# Patient Record
Sex: Male | Born: 2000 | Race: Black or African American | Hispanic: No | Marital: Single | Smoking: Never smoker
Health system: Southern US, Community
[De-identification: ages and names within clinical notes are randomized; demographics above are authoritative.]

## PROBLEM LIST (undated history)

## (undated) DIAGNOSIS — D571 Sickle-cell disease without crisis: Secondary | ICD-10-CM

## (undated) DIAGNOSIS — D5700 Hb-SS disease with crisis, unspecified: Secondary | ICD-10-CM

## (undated) DIAGNOSIS — I10 Essential (primary) hypertension: Secondary | ICD-10-CM

## (undated) DIAGNOSIS — J189 Pneumonia, unspecified organism: Secondary | ICD-10-CM

## (undated) DIAGNOSIS — D5701 Hb-SS disease with acute chest syndrome: Secondary | ICD-10-CM

## (undated) DIAGNOSIS — J45909 Unspecified asthma, uncomplicated: Secondary | ICD-10-CM

## (undated) DIAGNOSIS — H538 Other visual disturbances: Secondary | ICD-10-CM

## (undated) DIAGNOSIS — K047 Periapical abscess without sinus: Secondary | ICD-10-CM

## (undated) DIAGNOSIS — N08 Glomerular disorders in diseases classified elsewhere: Secondary | ICD-10-CM

## (undated) HISTORY — PX: TONSILLECTOMY AND ADENOIDECTOMY: SUR1326

## (undated) HISTORY — PX: TONSILLECTOMY: SUR1361

## (undated) HISTORY — PX: CIRCUMCISION: SUR203

---
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
## 2018-02-08 ENCOUNTER — Emergency Department (HOSPITAL_COMMUNITY)
Admission: EM | Admit: 2018-02-08 | Discharge: 2018-02-08 | Disposition: A | Discharge status: Home / Self Care | Payer: Medicaid Other | Attending: Emergency Medicine | Admitting: Emergency Medicine

## 2018-02-08 ENCOUNTER — Other Ambulatory Visit: Payer: Self-pay

## 2018-02-08 ENCOUNTER — Encounter (HOSPITAL_COMMUNITY): Payer: Self-pay | Admitting: Emergency Medicine

## 2018-02-08 ENCOUNTER — Emergency Department (HOSPITAL_COMMUNITY): Payer: Medicaid Other

## 2018-02-08 DIAGNOSIS — R0789 Other chest pain: Secondary | ICD-10-CM | POA: Diagnosis not present

## 2018-02-08 DIAGNOSIS — J45909 Unspecified asthma, uncomplicated: Secondary | ICD-10-CM | POA: Insufficient documentation

## 2018-02-08 DIAGNOSIS — D5700 Hb-SS disease with crisis, unspecified: Secondary | ICD-10-CM | POA: Insufficient documentation

## 2018-02-08 DIAGNOSIS — Z79899 Other long term (current) drug therapy: Secondary | ICD-10-CM | POA: Diagnosis not present

## 2018-02-08 LAB — CBC WITH DIFFERENTIAL/PLATELET
Basophils Absolute: 0 10*3/uL (ref 0.0–0.1)
Basophils Relative: 0 %
Eosinophils Absolute: 0.2 10*3/uL (ref 0.0–0.7)
Eosinophils Relative: 1 %
HCT: 28.5 % — ABNORMAL LOW (ref 39.0–52.0)
Hemoglobin: 10 g/dL — ABNORMAL LOW (ref 13.0–17.0)
Lymphocytes Relative: 29 %
Lymphs Abs: 3.9 10*3/uL (ref 0.7–4.0)
MCH: 31 pg (ref 26.0–34.0)
MCHC: 35.1 g/dL (ref 30.0–36.0)
MCV: 88.2 fL (ref 78.0–100.0)
Monocytes Absolute: 1.5 10*3/uL — ABNORMAL HIGH (ref 0.1–1.0)
Monocytes Relative: 11 %
Neutro Abs: 7.9 10*3/uL — ABNORMAL HIGH (ref 1.7–7.7)
Neutrophils Relative %: 59 %
Platelets: 350 10*3/uL (ref 150–400)
RBC: 3.23 MIL/uL — ABNORMAL LOW (ref 4.22–5.81)
RDW: 17.8 % — ABNORMAL HIGH (ref 11.5–15.5)
WBC: 13.6 10*3/uL — ABNORMAL HIGH (ref 4.0–10.5)

## 2018-02-08 LAB — COMPREHENSIVE METABOLIC PANEL
ALT: 24 U/L (ref 17–63)
AST: 39 U/L (ref 15–41)
Albumin: 4.1 g/dL (ref 3.5–5.0)
Alkaline Phosphatase: 83 U/L (ref 38–126)
Anion gap: 11 (ref 5–15)
BUN: 7 mg/dL (ref 6–20)
CO2: 24 mmol/L (ref 22–32)
Calcium: 9 mg/dL (ref 8.9–10.3)
Chloride: 102 mmol/L (ref 101–111)
Creatinine, Ser: 0.66 mg/dL (ref 0.61–1.24)
GFR calc Af Amer: >60 mL/min (ref >60)
GFR calc non Af Amer: >60 mL/min (ref >60)
Glucose, Bld: 111 mg/dL — ABNORMAL HIGH (ref 65–99)
Potassium: 4.2 mmol/L (ref 3.5–5.1)
Sodium: 137 mmol/L (ref 135–145)
Total Bilirubin: 2.4 mg/dL — ABNORMAL HIGH (ref 0.3–1.2)
Total Protein: 7.6 g/dL (ref 6.5–8.1)

## 2018-02-08 LAB — RETICULOCYTES
RBC.: 3.23 MIL/uL — ABNORMAL LOW (ref 4.22–5.81)
Retic Count, Absolute: 235.8 10*3/uL — ABNORMAL HIGH (ref 19.0–186.0)
Retic Ct Pct: 7.3 % — ABNORMAL HIGH (ref 0.4–3.1)

## 2018-02-08 LAB — I-STAT TROPONIN, ED: Troponin i, poc: 0 ng/mL (ref 0.00–0.08)

## 2018-02-08 MED ORDER — HYDROMORPHONE HCL 2 MG/ML IJ SOLN: 2.0000 mg | INTRAMUSCULAR | Status: AC

## 2018-02-08 MED ORDER — ONDANSETRON HCL 4 MG/2ML IJ SOLN
4.0000 mg | INTRAMUSCULAR | Status: DC | PRN
  Administered 2018-02-08: 4 mg via INTRAVENOUS
  Filled 2018-02-08: qty 2

## 2018-02-08 MED ORDER — SODIUM CHLORIDE 0.45 % IV SOLN
INTRAVENOUS | Status: DC
  Administered 2018-02-08: 21:00:00 via INTRAVENOUS

## 2018-02-08 MED ORDER — HYDROMORPHONE HCL 2 MG/ML IJ SOLN
2.0000 mg | INTRAMUSCULAR | Status: AC
  Administered 2018-02-08: 2 mg via INTRAVENOUS
  Filled 2018-02-08: qty 1

## 2018-02-08 MED ORDER — HYDROMORPHONE HCL 2 MG/ML IJ SOLN: 2.0000 mg | INTRAMUSCULAR | Status: DC

## 2018-02-08 MED ORDER — KETOROLAC TROMETHAMINE 30 MG/ML IJ SOLN
30.0000 mg | INTRAMUSCULAR | Status: AC
  Administered 2018-02-08: 30 mg via INTRAVENOUS
  Filled 2018-02-08: qty 1

## 2018-02-08 NOTE — Discharge Instructions (Signed)
Please follow up with your sickle cell provider regarding your visit today. Take your pain medications as prescribed, as needed. Return to the ER for worsening chest pain, shortness of breath, fever, or new or concerning symptoms.

## 2018-02-08 NOTE — ED Triage Notes (Signed)
Patient complaining of pain all over. Patient states he is having a sickle cell crisis.

## 2018-02-08 NOTE — ED Provider Notes (Signed)
[facility]-EMERGENCY DEPT Provider Note   CSN: [id number] Arrival date & time: 02/08/18  1957     History   Chief Complaint Chief Complaint  Patient presents with  . Sickle Cell Pain Crisis    HPI [name] Hill is a 17 y.o. male with past medical history of sickle cell anemia, acute chest syndrome, presenting to the ED with sickle cell pain.  Patient states pain began 2 days ago, localized to the left back wrapping around to the left lower anterior chest.  Patient states his typical sickle cell pain varies, with history of occasional chest pain as well.  He does say he has history of acute chest syndrome, however symptoms are not similar.  He takes 10 mg of oxycodone every 6 hours as well as ibuprofen, which did not provide significant enough relief.  He denies shortness of breath, fever, headache, vision changes, abdominal pain, nausea or vomiting, priapism, or other complaints.  The history is provided by the patient.    Past Medical History:  Diagnosis Date  . 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 Blurred vision   . Sickle cell anemia (HCC)   . Sickle cell crisis (HCC)   . Sickle cell nephropathy [facility])     Patient Active Problem List   Diagnosis Date Noted  . Leukocytosis   . Transaminitis   . 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 Family circumstance 07/03/2016  . 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 Asthma 06/03/2012  . Abnormal presence of protein in urine 06/03/2012  . 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 CIRCUMCISION    . TONSILLECTOMY         Home Medications    Prior to Admission medications   Medication Sig Start Date End Date Taking? Authorizing Provider  albuterol (PROVENTIL HFA;VENTOLIN HFA) 108 (90 Base) MCG/ACT inhaler Inhale 2 puffs into the lungs every 4  (four) hours as needed for wheezing or shortness of breath. 10/05/16  Yes [name], MD  hydroxyurea (DROXIA) 400 MG capsule Take 4 capsules (1,600 mg total) by mouth daily. 10/05/16  Yes [name], MD  lisinopril (PRINIVIL,ZESTRIL) 10 MG tablet Take 1 tablet (10 mg total) by mouth daily. 10/05/16  Yes [name], MD  loperamide (IMODIUM) 2 MG capsule Take 1 capsule (2 mg total) 4 (four) times daily as needed by mouth for diarrhea or loose stools. 10/05/17  Yes Audry Pili, PA-C  oxyCODONE (OXY IR/ROXICODONE) 5 MG immediate release tablet Take 1 tablet (5 mg total) by mouth every 6 (six) hours. As needed for pain 11/06/17  Yes Altha Harm, MD  doxycycline (VIBRA-TABS) 100 MG tablet Take 1 tablet (100 mg total) by mouth 2 (two) times daily. Patient not taking: Reported on 02/08/2018 12/10/17   Elvina Sidle, MD    Family History Family History  Problem Relation Age of Onset  . Sickle cell anemia Brother   . Hypertension Maternal Grandmother   . Anemia Mother     Social History Social History   Tobacco Use  . Smoking status: Never Smoker  . Smokeless tobacco: Never Used  Substance Use Topics  . Alcohol use: No  . Drug use: No     Allergies   Patient has no known allergies.   Review of Systems Review of Systems  Constitutional: Negative for fever.  Respiratory: Negative for shortness of breath.   Cardiovascular: Positive for chest pain. Negative for palpitations and leg swelling.  Gastrointestinal: Negative for abdominal pain and nausea.  Neurological: Negative for headaches.  All other systems reviewed and are negative.    Physical Exam Updated Vital Signs BP (!) 103/45   Pulse 78   Temp 99.7 F (37.6 C) (Oral)   Resp 16   Ht 5\' 7"  (1.702 m)   Wt 83.5 kg (184 lb)   SpO2 (!) 89%   BMI 28.82 kg/m   Physical Exam  Constitutional: He is oriented to person, place, and time. He appears well-developed and well-nourished. No distress.    HENT:  Head: Normocephalic and atraumatic.  Eyes: Conjunctivae and EOM are normal. Pupils are equal, round, and reactive to light.  Cardiovascular: Normal rate, regular rhythm, normal heart sounds and intact distal pulses.  Pulmonary/Chest: Effort normal and breath sounds normal. No respiratory distress.  TTP over left posterior lateral and anterior chest wall.  No crepitus.  Symmetric chest expansion.  Lung sounds normal.  No increased work of breathing.  Abdominal: Soft. Bowel sounds are normal. He exhibits no distension. There is no tenderness. There is no rebound and no guarding.  Musculoskeletal:  No lower extremity edema or tenderness.  Neurological: He is alert and oriented to person, place, and time.  Skin: Skin is warm.  Psychiatric: He has a normal mood and affect. His behavior is normal.  Nursing note and vitals reviewed.    ED Treatments / Results  Labs (all labs ordered are listed, but only abnormal results are displayed) Labs Reviewed  COMPREHENSIVE METABOLIC PANEL - Abnormal; Notable for the following components:      Result Value   Glucose, Bld 111 (*)    Total Bilirubin 2.4 (*)    All other components within normal limits  CBC WITH DIFFERENTIAL/PLATELET - Abnormal; Notable for the following components:   WBC 13.6 (*)    RBC 3.23 (*)    Hemoglobin 10.0 (*)    HCT 28.5 (*)    RDW 17.8 (*)    Neutro Abs 7.9 (*)    Monocytes Absolute 1.5 (*)    All other components within normal limits  RETICULOCYTES - Abnormal; Notable for the following components:   Retic Ct Pct 7.3 (*)    RBC. 3.23 (*)    Retic Count, Absolute 235.8 (*)    All other components within normal limits  I-STAT TROPONIN, ED    EKG  EKG Interpretation  Date/Time:  Sunday February 08 2018 21:03:59 EDT Ventricular Rate:  90 PR Interval:    QRS Duration: 103 QT Interval:  331 QTC Calculation: 405 R Axis:   58 Text Interpretation:  Sinus rhythm Minimal ST depression No significant change since  last tracing Confirmed by Richardean Canal [phone]) on 02/08/2018 10:59:33 PM       Radiology Dg Chest 2 View  Result Date: 02/08/2018 CLINICAL DATA:  Chest pain.  Sickle cell disease. EXAM: CHEST - 2 VIEW COMPARISON:  November 02, 2017 FINDINGS: No edema or consolidation. There is stable cardiomegaly. The pulmonary vascularity is normal. No adenopathy. There are multiple endplate infarcts throughout the visualized spine consistent with known sickle cell disease. IMPRESSION: Stable cardiomegaly. Lungs clear. Multiple endplate infarcts in the spine consistent with known sickle cell disease. Electronically Signed   By: Bretta Bang III M.D.   On: 02/08/2018 21:18    Procedures Procedures (including critical care time)  Medications Ordered in ED Medications  0.45 % sodium chloride infusion ( Intravenous New Bag/Given 02/08/18 2129)  HYDROmorphone (DILAUDID) injection 2 mg (not administered)    Or  HYDROmorphone (DILAUDID) injection 2 mg (not administered)  HYDROmorphone (DILAUDID) injection 2 mg (not administered)    Or  HYDROmorphone (DILAUDID) injection 2 mg (not administered)  ondansetron (ZOFRAN) injection 4 mg (4 mg Intravenous Given 02/08/18 2146)  ketorolac (TORADOL) 30 MG/ML injection 30 mg (30 mg Intravenous Given 02/08/18 2144)  HYDROmorphone (DILAUDID) injection 2 mg (2 mg Intravenous Given 02/08/18 2144)    Or  HYDROmorphone (DILAUDID) injection 2 mg ( Subcutaneous See Alternative 02/08/18 2144)  HYDROmorphone (DILAUDID) injection 2 mg (2 mg Intravenous Given 02/08/18 2250)    Or  HYDROmorphone (DILAUDID) injection 2 mg ( Subcutaneous See Alternative 02/08/18 2250)     Initial Impression / Assessment and Plan / ED Course  I have reviewed the triage vital signs and the nursing notes.  Pertinent labs & imaging results that were available during my care of the patient were reviewed by me and considered in my medical decision making (see chart for details).  Clinical Course as of  Feb 09 2308  Wynelle LinkSun Feb 08, 2018  2302 Re-evaluated patient, he was laying on his side. Asked patient to sit up, and readjusted O2 probe, with O2 sat 94% on RA. Doubt accuracy of 89% reading. Pt is well-appearing, normal work of breathing, reporting pain os 2/10 and stating he feels well and ready for discharge. Pt is safe for discharge. SpO2: (!) 89 % [JR]    Clinical Course User Index [JR] Adilynn Bessey, [place] N, PA-C    Patient presenting with sickle cell crisis. Patient with left sided chest pain, with hx of chest pain 2/t crisis in the past. CP is reproducible on exam. Heart and lung exam is normal. CXR neg. VSS. Afebrile. No evidence of acute chest syndrome based on evaluation and workup. Trop neg. EKG with acute changes. Pt without abdominal pain or SOB. Pt is not exhibiting signs or symptoms of organ failure, priapism, or DVT. Pt is afebrile, hemodynamically stable. WBC wnl. Retic appropriately elevated. Hgb improved from baseline. Patient treated with sickle cell protocol, with improvement in symptoms after 2 doses. Pt tolerating PO. Pt is safe for discharge with symptomatic management. Strict return precautions discussed.  Patient discussed with Dr. Silverio LayYao, who agrees with workup and plan for discharge.  Discussed results, findings, treatment and follow up. Patient advised of return precautions. Patient verbalized understanding and agreed with plan.   Final Clinical Impressions(s) / ED Diagnoses   Final diagnoses:  Sickle cell anemia with pain [facility](HCC)    ED Discharge Orders    None       Aharon Carriere, [place] N, PA-C 02/08/18 2310    Charlynne PanderYao, David Hsienta, MD 02/08/18 [phone]

## 2018-03-03 ENCOUNTER — Emergency Department (HOSPITAL_COMMUNITY)
Admission: EM | Admit: 2018-03-03 | Discharge: 2018-03-04 | Discharge status: Against Medical Advice | Payer: Medicaid Other | Attending: Emergency Medicine | Admitting: Emergency Medicine

## 2018-03-03 ENCOUNTER — Encounter (HOSPITAL_COMMUNITY): Payer: Self-pay | Admitting: Emergency Medicine

## 2018-03-03 DIAGNOSIS — D5700 Hb-SS disease with crisis, unspecified: Secondary | ICD-10-CM | POA: Diagnosis present

## 2018-03-03 DIAGNOSIS — Z5321 Procedure and treatment not carried out due to patient leaving prior to being seen by health care provider: Secondary | ICD-10-CM | POA: Diagnosis not present
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
## 2018-03-04 NOTE — ED Notes (Signed)
Pt not in room.  Gown on bed.

## 2018-03-14 ENCOUNTER — Other Ambulatory Visit: Payer: Self-pay

## 2018-03-14 ENCOUNTER — Emergency Department (HOSPITAL_COMMUNITY)
Admission: EM | Admit: 2018-03-14 | Discharge: 2018-03-14 | Disposition: A | Discharge status: Home / Self Care | Payer: Medicaid Other | Attending: Emergency Medicine | Admitting: Emergency Medicine

## 2018-03-14 ENCOUNTER — Emergency Department (HOSPITAL_COMMUNITY): Payer: Medicaid Other

## 2018-03-14 DIAGNOSIS — R0789 Other chest pain: Secondary | ICD-10-CM | POA: Diagnosis not present

## 2018-03-14 DIAGNOSIS — R0602 Shortness of breath: Secondary | ICD-10-CM | POA: Insufficient documentation

## 2018-03-14 DIAGNOSIS — D5700 Hb-SS disease with crisis, unspecified: Secondary | ICD-10-CM | POA: Diagnosis not present

## 2018-03-14 DIAGNOSIS — Z79899 Other long term (current) drug therapy: Secondary | ICD-10-CM | POA: Insufficient documentation

## 2018-03-14 LAB — CBC WITH DIFFERENTIAL/PLATELET
Basophils Absolute: 0.1 10*3/uL (ref 0.0–0.1)
Basophils Relative: 1 %
Eosinophils Absolute: 0.2 10*3/uL (ref 0.0–0.7)
Eosinophils Relative: 2 %
HCT: 26.9 % — ABNORMAL LOW (ref 39.0–52.0)
Hemoglobin: 9.7 g/dL — ABNORMAL LOW (ref 13.0–17.0)
Lymphocytes Relative: 30 %
Lymphs Abs: 4.4 10*3/uL — ABNORMAL HIGH (ref 0.7–4.0)
MCH: 30.2 pg (ref 26.0–34.0)
MCHC: 36.1 g/dL — ABNORMAL HIGH (ref 30.0–36.0)
MCV: 83.8 fL (ref 78.0–100.0)
Monocytes Absolute: 1.9 10*3/uL — ABNORMAL HIGH (ref 0.1–1.0)
Monocytes Relative: 14 %
Neutro Abs: 7.8 10*3/uL — ABNORMAL HIGH (ref 1.7–7.7)
Neutrophils Relative %: 53 %
Platelets: 379 10*3/uL (ref 150–400)
RBC: 3.21 MIL/uL — ABNORMAL LOW (ref 4.22–5.81)
RDW: 18.9 % — ABNORMAL HIGH (ref 11.5–15.5)
WBC: 14.4 10*3/uL — ABNORMAL HIGH (ref 4.0–10.5)

## 2018-03-14 LAB — URINALYSIS, ROUTINE W REFLEX MICROSCOPIC
Bacteria, UA: NONE SEEN
Bilirubin Urine: NEGATIVE
Glucose, UA: NEGATIVE mg/dL
Ketones, ur: NEGATIVE mg/dL
Leukocytes, UA: NEGATIVE
Nitrite: NEGATIVE
Protein, ur: 100 mg/dL — AB
RBC / HPF: NONE SEEN RBC/hpf (ref 0–5)
Specific Gravity, Urine: 1.009 (ref 1.005–1.030)
Squamous Epithelial / LPF: NONE SEEN
pH: 7 (ref 5.0–8.0)

## 2018-03-14 LAB — COMPREHENSIVE METABOLIC PANEL
ALT: 14 U/L — ABNORMAL LOW (ref 17–63)
AST: 39 U/L (ref 15–41)
Albumin: 4.1 g/dL (ref 3.5–5.0)
Alkaline Phosphatase: 106 U/L (ref 38–126)
Anion gap: 10 (ref 5–15)
BUN: 7 mg/dL (ref 6–20)
CO2: 25 mmol/L (ref 22–32)
Calcium: 9.1 mg/dL (ref 8.9–10.3)
Chloride: 104 mmol/L (ref 101–111)
Creatinine, Ser: 0.42 mg/dL — ABNORMAL LOW (ref 0.61–1.24)
GFR calc Af Amer: >60 mL/min (ref >60)
GFR calc non Af Amer: >60 mL/min (ref >60)
Glucose, Bld: 95 mg/dL (ref 65–99)
Potassium: 4.4 mmol/L (ref 3.5–5.1)
Sodium: 139 mmol/L (ref 135–145)
Total Bilirubin: 3.8 mg/dL — ABNORMAL HIGH (ref 0.3–1.2)
Total Protein: 7.7 g/dL (ref 6.5–8.1)

## 2018-03-14 LAB — RETICULOCYTES
RBC.: 3.21 MIL/uL — ABNORMAL LOW (ref 4.22–5.81)
Retic Count, Absolute: 385.2 10*3/uL — ABNORMAL HIGH (ref 19.0–186.0)
Retic Ct Pct: 12 % — ABNORMAL HIGH (ref 0.4–3.1)

## 2018-03-14 MED ORDER — HYDROMORPHONE HCL 1 MG/ML IJ SOLN: 0.5000 mg | INTRAMUSCULAR | Status: AC

## 2018-03-14 MED ORDER — HYDROMORPHONE HCL 1 MG/ML IJ SOLN
0.5000 mg | Freq: Once | INTRAMUSCULAR | Status: AC
  Administered 2018-03-14: 0.5 mg via SUBCUTANEOUS
  Filled 2018-03-14: qty 1

## 2018-03-14 MED ORDER — SODIUM CHLORIDE 0.45 % IV SOLN: INTRAVENOUS | Status: DC

## 2018-03-14 MED ORDER — OXYCODONE HCL 5 MG PO CAPS: 5.0000 mg | ORAL_CAPSULE | Freq: Four times a day (QID) | ORAL | 0 refills | Status: DC | PRN

## 2018-03-14 MED ORDER — HYDROMORPHONE HCL 1 MG/ML IJ SOLN: 1.0000 mg | INTRAMUSCULAR | Status: DC

## 2018-03-14 MED ORDER — HYDROMORPHONE HCL 1 MG/ML IJ SOLN: 1.0000 mg | INTRAMUSCULAR | Status: AC

## 2018-03-14 MED ORDER — KETOROLAC TROMETHAMINE 15 MG/ML IJ SOLN
15.0000 mg | INTRAMUSCULAR | Status: AC
  Administered 2018-03-14: 15 mg via INTRAVENOUS
  Filled 2018-03-14: qty 1

## 2018-03-14 MED ORDER — HYDROMORPHONE HCL 1 MG/ML IJ SOLN
1.0000 mg | INTRAMUSCULAR | Status: AC
  Administered 2018-03-14: 1 mg via INTRAVENOUS
  Filled 2018-03-14: qty 1

## 2018-03-14 MED ORDER — HYDROMORPHONE HCL 1 MG/ML IJ SOLN
0.5000 mg | INTRAMUSCULAR | Status: AC
  Administered 2018-03-14: 0.5 mg via INTRAVENOUS
  Filled 2018-03-14: qty 1

## 2018-03-14 MED ORDER — ONDANSETRON HCL 4 MG/2ML IJ SOLN: 4.0000 mg | INTRAMUSCULAR | Status: DC | PRN

## 2018-03-14 MED ORDER — DEXTROSE 5 % AND 0.45 % NACL IV BOLUS
1000.0000 mL | Freq: Once | INTRAVENOUS | Status: AC
  Administered 2018-03-14: 1000 mL via INTRAVENOUS

## 2018-03-14 NOTE — ED Notes (Signed)
Pt states that urine sample was given earlier.

## 2018-03-14 NOTE — ED Triage Notes (Signed)
Pt sts crisis started 2 days ago. He took 10 mg oxycodone this am @0630  no relief. Co sharp chest pain, O@ sat 89 % room air. 97% on 2 liters o2

## 2018-03-14 NOTE — Discharge Instructions (Addendum)
1.  Must have very close follow-up with your primary care\sickle cell provider.  Call Monday to schedule recheck. 2.  Return to the emergency department if symptoms worsen or change.

## 2018-03-14 NOTE — ED Provider Notes (Signed)
[facility]-EMERGENCY DEPT Provider Note   CSN: [id number] Arrival date & time: 03/14/18  4098     History   Chief Complaint Chief Complaint  Patient presents with  . Sickle Cell Pain Crisis    HPI [name] Hill is a 17 y.o. male.  HPI Patient states he started developing some very mild chest discomfort 2 days ago.  He reports it was gradual in onset.  He reports over the past day however he now has quite severe pain on both sides of his chest and the ribs.  Ports it feels like he is getting pounded with a rubber mallet.  He denies any fever or cough.  No lower extremity swelling or calf pain.  Patient reports he does feel somewhat short of breath.  He has tried his home oxycodone but still is having a lot of pain.  He tried one dose last night and one dose today.  Patient reports he takes the oxycodone currently rarely only when pain occurs.  He denies other areas of pain.  Symptoms did not start with sore throat or upper respiratory symptoms. Past Medical History:  Diagnosis Date  . 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 Blurred vision   . Sickle cell anemia (HCC)   . Sickle cell crisis (HCC)   . Sickle cell nephropathy [facility])     Patient Active Problem List   Diagnosis Date Noted  . Leukocytosis   . Transaminitis   . 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 Family circumstance 07/03/2016  . 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 Asthma 06/03/2012  . Abnormal presence of protein in urine 06/03/2012  . 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 CIRCUMCISION    . TONSILLECTOMY    . TONSILLECTOMY AND ADENOIDECTOMY          Home Medications    Prior to Admission medications   Medication Sig Start Date End Date Taking? Authorizing Provider  albuterol (PROVENTIL HFA;VENTOLIN HFA) 108 (90  Base) MCG/ACT inhaler Inhale 2 puffs into the lungs every 4 (four) hours as needed for wheezing or shortness of breath. 10/05/16  Yes [name], MD  hydroxyurea (DROXIA) 400 MG capsule Take 4 capsules (1,600 mg total) by mouth daily. 10/05/16  Yes [name], MD  lisinopril (PRINIVIL,ZESTRIL) 10 MG tablet Take 1 tablet (10 mg total) by mouth daily. 10/05/16  Yes [name], MD  loperamide (IMODIUM) 2 MG capsule Take 1 capsule (2 mg total) 4 (four) times daily as needed by mouth for diarrhea or loose stools. 10/05/17  Yes Audry Pili, PA-C  oxyCODONE (OXY IR/ROXICODONE) 5 MG immediate release tablet Take 1 tablet (5 mg total) by mouth every 6 (six) hours. As needed for pain 11/06/17  Yes Altha Harm, MD  doxycycline (VIBRA-TABS) 100 MG tablet Take 1 tablet (100 mg total) by mouth 2 (two) times daily. Patient not taking: Reported on 02/08/2018 12/10/17   Elvina Sidle, MD  oxycodone (OXY-IR) 5 MG capsule Take 1 capsule (5 mg total) by mouth every 6 (six) hours as needed. 03/14/18   Arby Barrette, MD    Family History Family History  Problem Relation Age of Onset  . Sickle cell anemia Brother   . Hypertension Maternal Grandmother   . Anemia Mother     Social History Social History   Tobacco  Use  . Smoking status: Never Smoker  . Smokeless tobacco: Never Used  Substance Use Topics  . Alcohol use: No  . Drug use: No     Allergies   Patient has no known allergies.   Review of Systems Review of Systems 10 Systems reviewed and are negative for acute change except as noted in the HPI.  Physical Exam Updated Vital Signs BP (!) 116/49 (BP Location: Right Arm)   Pulse 71   Temp 98.5 F (36.9 C) (Oral)   Resp (!) 24   SpO2 91%   Physical Exam  Constitutional: He is oriented to person, place, and time. He appears well-developed and well-nourished.  Patient is alert.  No acute respiratory distress.  He does appear uncomfortable.  Ental status is clear.    HENT:  Head: Normocephalic and atraumatic.  Nose: Nose normal.  Mouth/Throat: Oropharynx is clear and moist.  Eyes: Pupils are equal, round, and reactive to light. EOM are normal.  Neck: Neck supple.  Cardiovascular: Normal rate, regular rhythm, normal heart sounds and intact distal pulses.  Pulmonary/Chest: Effort normal and breath sounds normal.  No reproducible chest wall pain.  Abdominal: Soft. Bowel sounds are normal. He exhibits no distension. There is no tenderness.  Musculoskeletal: Normal range of motion. He exhibits no edema or tenderness.  Neurological: He is alert and oriented to person, place, and time. He has normal strength. No cranial nerve deficit. He exhibits normal muscle tone. Coordination normal. GCS eye subscore is 4. GCS verbal subscore is 5. GCS motor subscore is 6.  Skin: Skin is warm, dry and intact.  Psychiatric: He has a normal mood and affect.     ED Treatments / Results  Labs (all labs ordered are listed, but only abnormal results are displayed) Labs Reviewed  COMPREHENSIVE METABOLIC PANEL - Abnormal; Notable for the following components:      Result Value   Creatinine, Ser 0.42 (*)    ALT 14 (*)    Total Bilirubin 3.8 (*)    All other components within normal limits  CBC WITH DIFFERENTIAL/PLATELET - Abnormal; Notable for the following components:   WBC 14.4 (*)    RBC 3.21 (*)    Hemoglobin 9.7 (*)    HCT 26.9 (*)    MCHC 36.1 (*)    RDW 18.9 (*)    Neutro Abs 7.8 (*)    Lymphs Abs 4.4 (*)    Monocytes Absolute 1.9 (*)    All other components within normal limits  RETICULOCYTES - Abnormal; Notable for the following components:   Retic Ct Pct 12.0 (*)    RBC. 3.21 (*)    Retic Count, Absolute 385.2 (*)    All other components within normal limits  URINALYSIS, ROUTINE W REFLEX MICROSCOPIC - Abnormal; Notable for the following components:   Hgb urine dipstick SMALL (*)    Protein, ur 100 (*)    All other components within normal limits     EKG None  Radiology Dg Chest 2 View  Result Date: 03/14/2018 CLINICAL DATA:  17 year old male with sickle cell crisis beginning 2 days previously. EXAM: CHEST - 2 VIEW COMPARISON:  Prior chest x-ray 02/08/2018 FINDINGS: Stable borderline cardiomegaly. The lungs are clear. H-shaped vertebral bodies with central depressions in the superior and inferior endplates consistent with the clinical history of sickle cell disease. No evidence of pneumothorax or pleural effusion. IMPRESSION: No active cardiopulmonary disease. Stable cardiomegaly. Electronically Signed   By: Malachy MoanHeath  McCullough M.D.   On: 03/14/2018  12:43    Procedures Procedures (including critical care time)  Medications Ordered in ED Medications  HYDROmorphone (DILAUDID) injection 1 mg (has no administration in time range)    Or  HYDROmorphone (DILAUDID) injection 1 mg (has no administration in time range)  ondansetron (ZOFRAN) injection 4 mg (has no administration in time range)  HYDROmorphone (DILAUDID) injection 0.5 mg (0.5 mg Subcutaneous Given 03/14/18 1156)  ketorolac (TORADOL) 15 MG/ML injection 15 mg (15 mg Intravenous Given 03/14/18 1234)  HYDROmorphone (DILAUDID) injection 0.5 mg (0.5 mg Intravenous Given 03/14/18 1234)    Or  HYDROmorphone (DILAUDID) injection 0.5 mg ( Subcutaneous See Alternative 03/14/18 1234)  HYDROmorphone (DILAUDID) injection 1 mg (1 mg Intravenous Given 03/14/18 1343)    Or  HYDROmorphone (DILAUDID) injection 1 mg ( Subcutaneous See Alternative 03/14/18 1343)  HYDROmorphone (DILAUDID) injection 1 mg (1 mg Intravenous Given 03/14/18 1441)    Or  HYDROmorphone (DILAUDID) injection 1 mg ( Subcutaneous See Alternative 03/14/18 1441)  dextrose 5 % and 0.45% NaCl 5-0.45 % bolus 1,000 mL (0 mLs Intravenous Stopped 03/14/18 1441)     Initial Impression / Assessment and Plan / ED Course  I have reviewed the triage vital signs and the nursing notes.  Pertinent labs & imaging results that were available  during my care of the patient were reviewed by me and considered in my medical decision making (see chart for details).    Recheck: Patient's pain much improved after initiating pain medications. Consult: [place] of sickle cell medicine.  He evaluated the patient and advised at this time patient's oxygen saturation is good on room air and pain is resolved.  He advises patient stable for discharge at this time does not need admission.  Recheck: Discussed plan with the patient.  Patient does report he feels pain is resolved and is stable to go home.  He is being seen with his mother.  She reports they will return immediately should symptoms recur.  Final Clinical Impressions(s) / ED Diagnoses   Final diagnoses:  Sickle cell pain crisis [facility])   Patient presented with chest pain, shortness of breath and mild hypoxia.  He has responded very well to fluid hydration pain control and oxygen.  I did have plan for admission.  Patient however had significant improvement and after evaluation by sickle cell provider, it was determined he was stable for discharge.  Patient's mother reports her last oxycodone prescription was filled in February for 30 tablets.  He does not use pain medication on a regular basis.  At this time he is down to only a couple tablets.  Will provide 6 tablets to cover until they can contact their provider on Monday and get any needed refills.  Return precautions reviewed. ED Discharge Orders        Ordered    oxycodone (OXY-IR) 5 MG capsule  Every 6 hours PRN     03/14/18 1518       Arby Barrette, MD 03/14/18 1525

## 2018-03-14 NOTE — ED Notes (Signed)
Bed: WA09 Expected date:  Expected time:  Means of arrival:  Comments: 

## 2018-03-22 ENCOUNTER — Encounter (HOSPITAL_COMMUNITY): Payer: Self-pay | Admitting: *Deleted

## 2018-03-22 ENCOUNTER — Other Ambulatory Visit: Payer: Self-pay

## 2018-03-22 ENCOUNTER — Ambulatory Visit (HOSPITAL_COMMUNITY)
Admission: EM | Admit: 2018-03-22 | Discharge: 2018-03-22 | Disposition: A | Discharge status: Home / Self Care | Payer: Medicaid Other | Attending: Internal Medicine | Admitting: Internal Medicine

## 2018-03-22 DIAGNOSIS — R079 Chest pain, unspecified: Secondary | ICD-10-CM

## 2018-03-22 NOTE — ED Triage Notes (Signed)
C/O pain in right ribcage x 5 days, especially with deep breathing.  Denies injury.  States does not feel like his sickle cell disease.

## 2018-03-22 NOTE — ED Provider Notes (Signed)
17 year old male with history of sickle cell disease, comes in for 5-day history left rib cage/chest pain for the past 5 days.  Worse with deep breathing.  Denies any injury.   Worse with laying down.  States does not feel like his sickle cell disease.  However, given patient with recent sickle cell pain crisis, history of vaso-occlusive crisis, patient discharged in stable condition to the emergency department for further evaluation treatment needed.    Belinda Fisher, PA-C 03/22/18 [phone]

## 2018-03-24 ENCOUNTER — Encounter (HOSPITAL_COMMUNITY): Payer: Self-pay

## 2018-03-24 ENCOUNTER — Emergency Department (HOSPITAL_COMMUNITY)
Admission: EM | Admit: 2018-03-24 | Discharge: 2018-03-24 | Disposition: A | Discharge status: Home / Self Care | Payer: Medicaid Other | Attending: Emergency Medicine | Admitting: Emergency Medicine

## 2018-03-24 DIAGNOSIS — R0789 Other chest pain: Secondary | ICD-10-CM | POA: Diagnosis present

## 2018-03-24 DIAGNOSIS — Z5321 Procedure and treatment not carried out due to patient leaving prior to being seen by health care provider: Secondary | ICD-10-CM | POA: Insufficient documentation

## 2018-03-24 NOTE — ED Triage Notes (Signed)
Pt complains of chest wall pain for 6 days, was seen at Urgent Care yesterday for the same, states his chest xray was clear

## 2018-04-30 ENCOUNTER — Emergency Department (HOSPITAL_COMMUNITY): Payer: Medicaid Other

## 2018-04-30 ENCOUNTER — Emergency Department (HOSPITAL_COMMUNITY)
Admission: EM | Admit: 2018-04-30 | Discharge: 2018-04-30 | Disposition: A | Discharge status: Home / Self Care | Payer: Medicaid Other | Attending: Emergency Medicine | Admitting: Emergency Medicine

## 2018-04-30 ENCOUNTER — Encounter (HOSPITAL_COMMUNITY): Payer: Self-pay | Admitting: Emergency Medicine

## 2018-04-30 DIAGNOSIS — Z79899 Other long term (current) drug therapy: Secondary | ICD-10-CM | POA: Diagnosis not present

## 2018-04-30 DIAGNOSIS — R52 Pain, unspecified: Secondary | ICD-10-CM | POA: Diagnosis present

## 2018-04-30 DIAGNOSIS — R079 Chest pain, unspecified: Secondary | ICD-10-CM | POA: Diagnosis not present

## 2018-04-30 DIAGNOSIS — J45909 Unspecified asthma, uncomplicated: Secondary | ICD-10-CM | POA: Insufficient documentation

## 2018-04-30 DIAGNOSIS — D5700 Hb-SS disease with crisis, unspecified: Secondary | ICD-10-CM | POA: Diagnosis not present

## 2018-04-30 LAB — BASIC METABOLIC PANEL
Anion gap: 9 (ref 5–15)
BUN: 9 mg/dL (ref 6–20)
CO2: 26 mmol/L (ref 22–32)
Calcium: 8.7 mg/dL — ABNORMAL LOW (ref 8.9–10.3)
Chloride: 105 mmol/L (ref 101–111)
Creatinine, Ser: 0.63 mg/dL (ref 0.61–1.24)
GFR calc Af Amer: >60 mL/min (ref >60)
GFR calc non Af Amer: >60 mL/min (ref >60)
Glucose, Bld: 105 mg/dL — ABNORMAL HIGH (ref 65–99)
Potassium: 3.5 mmol/L (ref 3.5–5.1)
Sodium: 140 mmol/L (ref 135–145)

## 2018-04-30 LAB — CBC WITH DIFFERENTIAL/PLATELET
Basophils Absolute: 0.1 10*3/uL (ref 0.0–0.1)
Basophils Relative: 1 %
Eosinophils Absolute: 0.1 10*3/uL (ref 0.0–0.7)
Eosinophils Relative: 1 %
HCT: 23.2 % — ABNORMAL LOW (ref 39.0–52.0)
Hemoglobin: 8.5 g/dL — ABNORMAL LOW (ref 13.0–17.0)
Lymphocytes Relative: 28 %
Lymphs Abs: 4.2 10*3/uL — ABNORMAL HIGH (ref 0.7–4.0)
MCH: 31.8 pg (ref 26.0–34.0)
MCHC: 36.6 g/dL — ABNORMAL HIGH (ref 30.0–36.0)
MCV: 86.9 fL (ref 78.0–100.0)
Monocytes Absolute: 1.5 10*3/uL — ABNORMAL HIGH (ref 0.1–1.0)
Monocytes Relative: 10 %
Neutro Abs: 9 10*3/uL — ABNORMAL HIGH (ref 1.7–7.7)
Neutrophils Relative %: 60 %
Platelets: 508 10*3/uL — ABNORMAL HIGH (ref 150–400)
RBC: 2.67 MIL/uL — ABNORMAL LOW (ref 4.22–5.81)
RDW: 19.5 % — ABNORMAL HIGH (ref 11.5–15.5)
WBC: 14.9 10*3/uL — ABNORMAL HIGH (ref 4.0–10.5)

## 2018-04-30 LAB — RETICULOCYTES
RBC.: 2.67 MIL/uL — ABNORMAL LOW (ref 4.22–5.81)
Retic Count, Absolute: 264.3 10*3/uL — ABNORMAL HIGH (ref 19.0–186.0)
Retic Ct Pct: 9.9 % — ABNORMAL HIGH (ref 0.4–3.1)

## 2018-04-30 MED ORDER — HYDROMORPHONE HCL 2 MG/ML IJ SOLN
2.0000 mg | INTRAMUSCULAR | Status: AC
  Administered 2018-04-30: 2 mg via INTRAVENOUS

## 2018-04-30 MED ORDER — KETOROLAC TROMETHAMINE 30 MG/ML IJ SOLN
30.0000 mg | INTRAMUSCULAR | Status: AC
  Administered 2018-04-30: 30 mg via INTRAVENOUS
  Filled 2018-04-30: qty 1

## 2018-04-30 MED ORDER — HYDROMORPHONE HCL 2 MG/ML IJ SOLN: 2.0000 mg | INTRAMUSCULAR | Status: AC

## 2018-04-30 MED ORDER — HYDROMORPHONE HCL 2 MG/ML IJ SOLN: 2.0000 mg | INTRAMUSCULAR | Status: DC

## 2018-04-30 MED ORDER — HYDROMORPHONE HCL 2 MG/ML IJ SOLN
2.0000 mg | INTRAMUSCULAR | Status: AC
  Filled 2018-04-30: qty 1

## 2018-04-30 MED ORDER — HYDROMORPHONE HCL 2 MG/ML IJ SOLN
2.0000 mg | INTRAMUSCULAR | Status: AC
  Administered 2018-04-30: 2 mg via INTRAVENOUS
  Filled 2018-04-30: qty 1

## 2018-04-30 MED ORDER — OXYCODONE HCL 5 MG PO TABS
5.0000 mg | ORAL_TABLET | Freq: Once | ORAL | Status: AC
  Administered 2018-04-30: 5 mg via ORAL
  Filled 2018-04-30: qty 1

## 2018-04-30 NOTE — ED Provider Notes (Signed)
[facility]-EMERGENCY DEPT Provider Note   CSN: [id number] Arrival date & time: 04/30/18  1328     History   Chief Complaint Chief Complaint  Patient presents with  . Sickle Cell Pain Crisis  . Chest Pain  . Back Pain    HPI [name] Hill is a 17 y.o. male with a past medical history of sickle cell anemia, who presents to ED for evaluation of 7-hour history of diffuse chest pain, back pain and lower extremity pain.  States that the pain is aching and consistent with his prior sickle cell crises.  He took his home oxycodone with no improvement in his symptoms.  Per EMS, patient's chest pain began after he was told that his 5-year-old sister was found dead.  Denies any fever, vomiting, shortness of breath, hemoptysis, injuries or falls, abdominal pain.  HPI  Past Medical History:  Diagnosis Date  . 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 Blurred vision   . Sickle cell anemia (HCC)   . Sickle cell crisis (HCC)   . 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 Leukocytosis   . Transaminitis   . 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 Family circumstance 07/03/2016  . 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 Asthma 06/03/2012  . Abnormal presence of protein in urine 06/03/2012  . 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 CIRCUMCISION    . TONSILLECTOMY    . TONSILLECTOMY AND ADENOIDECTOMY          Home Medications    Prior to Admission medications   Medication Sig Start Date End Date Taking? Authorizing Provider  hydroxyurea (DROXIA) 400 MG capsule Take 4 capsules (1,600 mg total) by mouth daily. 10/05/16  Yes [name], MD  lisinopril (PRINIVIL,ZESTRIL) 10 MG tablet Take 1 tablet (10 mg total) by mouth daily. 10/05/16  Yes [name],  MD  oxyCODONE (OXY IR/ROXICODONE) 5 MG immediate release tablet Take 1 tablet (5 mg total) by mouth every 6 (six) hours. As needed for pain 11/06/17  Yes Altha Harm, MD  albuterol (PROVENTIL HFA;VENTOLIN HFA) 108 (90 Base) MCG/ACT inhaler Inhale 2 puffs into the lungs every 4 (four) hours as needed for wheezing or shortness of breath. 10/05/16   [name], MD  doxycycline (VIBRA-TABS) 100 MG tablet Take 1 tablet (100 mg total) by mouth 2 (two) times daily. Patient not taking: Reported on 02/08/2018 12/10/17   Elvina Sidle, MD  loperamide (IMODIUM) 2 MG capsule Take 1 capsule (2 mg total) 4 (four) times daily as needed by mouth for diarrhea or loose stools. 10/05/17   Audry Pili, PA-C  oxycodone (OXY-IR) 5 MG capsule Take 1 capsule (5 mg total) by mouth every 6 (six) hours as needed. Patient not taking: Reported on 04/30/2018 03/14/18   Arby Barrette, MD    Family History Family History  Problem Relation Age of Onset  . Sickle cell anemia Brother   . Hypertension Maternal Grandmother   . Anemia Mother     Social History Social History   Tobacco Use  . Smoking status: Never Smoker  . Smokeless tobacco: Never Used  Substance Use Topics  . Alcohol use: No  . Drug use: No     Allergies   Other   Review  of Systems Review of Systems  Constitutional: Negative for appetite change, chills and fever.  HENT: Negative for ear pain, rhinorrhea, sneezing and sore throat.   Eyes: Negative for photophobia and visual disturbance.  Respiratory: Negative for cough, chest tightness, shortness of breath and wheezing.   Cardiovascular: Positive for chest pain. Negative for palpitations.  Gastrointestinal: Negative for abdominal pain, blood in stool, constipation, diarrhea, nausea and vomiting.  Genitourinary: Negative for dysuria, hematuria and urgency.  Musculoskeletal: Positive for back pain and myalgias.  Skin: Negative for rash.  Neurological: Negative for dizziness,  weakness and light-headedness.     Physical Exam Updated Vital Signs BP (!) 107/54   Pulse 71   Temp 98.4 F (36.9 C) (Oral)   Resp 17   Ht 5\' 6"  (1.676 m)   Wt 83.5 kg (184 lb)   SpO2 99%   BMI 29.70 kg/m   Physical Exam  Constitutional: He appears well-developed and well-nourished. No distress.  Appears uncomfortable.  HENT:  Head: Normocephalic and atraumatic.  Nose: Nose normal.  Eyes: Conjunctivae and EOM are normal. Left eye exhibits no discharge. No scleral icterus.  Neck: Normal range of motion. Neck supple.  Cardiovascular: Normal rate, regular rhythm, normal heart sounds and intact distal pulses. Exam reveals no gallop and no friction rub.  No murmur heard. Pulmonary/Chest: Effort normal and breath sounds normal. No respiratory distress.  Abdominal: Soft. Bowel sounds are normal. He exhibits no distension. There is no tenderness. There is no guarding.  Musculoskeletal: Normal range of motion. He exhibits no edema.  Diffuse tenderness to palpation of the chest, back, lower extremities.  Neurological: He is alert. He exhibits normal muscle tone. Coordination normal.  Skin: Skin is warm and dry. No rash noted.  Psychiatric: He has a normal mood and affect.  Nursing note and vitals reviewed.    ED Treatments / Results  Labs (all labs ordered are listed, but only abnormal results are displayed) Labs Reviewed  BASIC METABOLIC PANEL - Abnormal; Notable for the following components:      Result Value   Glucose, Bld 105 (*)    Calcium 8.7 (*)    All other components within normal limits  CBC WITH DIFFERENTIAL/PLATELET - Abnormal; Notable for the following components:   WBC 14.9 (*)    RBC 2.67 (*)    Hemoglobin 8.5 (*)    HCT 23.2 (*)    MCHC 36.6 (*)    RDW 19.5 (*)    Platelets 508 (*)    Neutro Abs 9.0 (*)    Lymphs Abs 4.2 (*)    Monocytes Absolute 1.5 (*)    All other components within normal limits  RETICULOCYTES - Abnormal; Notable for the following  components:   Retic Ct Pct 9.9 (*)    RBC. 2.67 (*)    Retic Count, Absolute 264.3 (*)    All other components within normal limits    EKG EKG Interpretation  Date/Time:  Thursday April 30 2018 14:53:48 EDT Ventricular Rate:  83 PR Interval:    QRS Duration: 98 QT Interval:  355 QTC Calculation: 418 R Axis:   77 Text Interpretation:  Sinus rhythm Borderline prolonged PR interval RSR' in V1 or V2, right VCD or RVH Probable left ventricular hypertrophy No significant change since last tracing Confirmed by Jacalyn LefevreHaviland, Julie [id number]) on 04/30/2018 4:59:12 PM   Radiology Dg Chest 2 View  Result Date: 04/30/2018 CLINICAL DATA:  Shortness of breath and chest pain EXAM: CHEST - 2 VIEW COMPARISON:  03/14/2018  FINDINGS: Cardiac shadow is stable. The lungs are well aerated bilaterally. No focal infiltrate or sizable effusion is seen. No acute bony abnormality is noted. IMPRESSION: No acute abnormality noted. Electronically Signed   By: Alcide Clever M.D.   On: 04/30/2018 15:38    Procedures Procedures (including critical care time)  Medications Ordered in ED Medications  oxyCODONE (Oxy IR/ROXICODONE) immediate release tablet 5 mg (has no administration in time range)  HYDROmorphone (DILAUDID) injection 2 mg (2 mg Intravenous Given 04/30/18 1516)    Or  HYDROmorphone (DILAUDID) injection 2 mg ( Subcutaneous See Alternative 04/30/18 1516)  HYDROmorphone (DILAUDID) injection 2 mg (2 mg Intravenous Given 04/30/18 1638)    Or  HYDROmorphone (DILAUDID) injection 2 mg ( Subcutaneous See Alternative 04/30/18 1638)  ketorolac (TORADOL) 30 MG/ML injection 30 mg (30 mg Intravenous Given 04/30/18 1515)     Initial Impression / Assessment and Plan / ED Course  I have reviewed the triage vital signs and the nursing notes.  Pertinent labs & imaging results that were available during my care of the patient were reviewed by me and considered in my medical decision making (see chart for details).  Clinical Course as  of Apr 30 1699  Thu Apr 30, 2018  1654 Patient reports improvement in pain with 2 doses of IV Dilaudid given.  Rates pain at 4/10.  Requesting home dose of oxycodone.  Declines additional IV pain medication.   [HK]    Clinical Course User Index [HK] Dietrich Pates, PA-C   Patient presents to ED for evaluation of 7-hour history of diffuse chest pain, back pain and lower extremity pain.  Pain is described as aching and consistent with his prior sickle cell crises.  No improvement with his home oxycodone IR 5 mg.  Denies any fever, vomiting, shortness of breath, hemoptysis, injuries or falls or abdominal pain.  On physical exam initially patient did appear uncomfortable.  Vital signs within normal limits.  Chest x-ray unremarkable.  EKG with no changes compared to prior tracings.  CBC, BMP, reticulocyte count consistent with prior readings.  No concern for acute chest at this time.  Doubt pneumonia or other infectious process.  Patient was treated with 2 doses of IV Dilaudid.  He reports significant improvement in his symptoms and was requesting discharge home.  Feels that pain is adequately controlled.  He also needs to leave to go to a class.  Advised to follow-up with his PCP for further evaluation and to return to ED for any severe or worsening symptoms.  Portions of this note were generated with Scientist, clinical (histocompatibility and immunogenetics). Dictation errors may occur despite best attempts at proofreading.   Final Clinical Impressions(s) / ED Diagnoses   Final diagnoses:  Sickle cell crisis [facility])    ED Discharge Orders    None       Dietrich Pates, PA-C 04/30/18 1701    Charlynne Pander, MD 05/01/18 [phone]

## 2018-04-30 NOTE — ED Triage Notes (Signed)
Per [facility] pt from home for chest and back pain that started around 7am, shortly after his 409 yo sister found dead. Pt has sickle cell. IV 18g left forearm patient given Fentanyl 100 mcg in route. Vitals: 118/67, 70HR, 95% on RA. R16.

## 2018-05-01 ENCOUNTER — Emergency Department (HOSPITAL_COMMUNITY)
Admission: EM | Admit: 2018-05-01 | Discharge: 2018-05-01 | Discharge status: Against Medical Advice | Payer: Medicaid Other | Attending: Emergency Medicine | Admitting: Emergency Medicine

## 2018-05-01 ENCOUNTER — Encounter (HOSPITAL_COMMUNITY): Payer: Self-pay

## 2018-05-01 ENCOUNTER — Other Ambulatory Visit: Payer: Self-pay

## 2018-05-01 DIAGNOSIS — D5700 Hb-SS disease with crisis, unspecified: Secondary | ICD-10-CM | POA: Diagnosis present

## 2018-05-01 DIAGNOSIS — Z5321 Procedure and treatment not carried out due to patient leaving prior to being seen by health care provider: Secondary | ICD-10-CM | POA: Insufficient documentation

## 2018-05-01 NOTE — ED Notes (Signed)
Called patient to a room and no answer. 

## 2018-05-01 NOTE — ED Triage Notes (Addendum)
Patient c/o sickle cell pain crisis of the chest and back. Patient  States the pain med is not working. Patient was seen in the ED yesterday. Patient denies any SOB. Patient states that his sister died yesterday and that triggered a crisis.

## 2018-05-01 NOTE — ED Notes (Signed)
Pt called for room with no answer. 

## 2018-05-01 NOTE — ED Notes (Signed)
Called patient and no answer.

## 2018-05-16 ENCOUNTER — Inpatient Hospital Stay (HOSPITAL_COMMUNITY)
Admission: EM | Admit: 2018-05-16 | Discharge: 2018-05-18 | DRG: 812 | Disposition: A | Discharge status: Home / Self Care | Payer: Medicaid Other | Attending: Internal Medicine | Admitting: Internal Medicine

## 2018-05-16 ENCOUNTER — Encounter (HOSPITAL_COMMUNITY): Payer: Self-pay

## 2018-05-16 ENCOUNTER — Emergency Department (HOSPITAL_COMMUNITY): Payer: Medicaid Other

## 2018-05-16 ENCOUNTER — Other Ambulatory Visit: Payer: Self-pay

## 2018-05-16 DIAGNOSIS — D5701 Hb-SS disease with acute chest syndrome: Principal | ICD-10-CM | POA: Diagnosis present

## 2018-05-16 DIAGNOSIS — J45909 Unspecified asthma, uncomplicated: Secondary | ICD-10-CM | POA: Diagnosis present

## 2018-05-16 DIAGNOSIS — D5700 Hb-SS disease with crisis, unspecified: Secondary | ICD-10-CM | POA: Diagnosis not present

## 2018-05-16 DIAGNOSIS — R079 Chest pain, unspecified: Secondary | ICD-10-CM | POA: Diagnosis not present

## 2018-05-16 DIAGNOSIS — N08 Glomerular disorders in diseases classified elsewhere: Secondary | ICD-10-CM | POA: Diagnosis present

## 2018-05-16 DIAGNOSIS — Z832 Family history of diseases of the blood and blood-forming organs and certain disorders involving the immune mechanism: Secondary | ICD-10-CM | POA: Diagnosis not present

## 2018-05-16 DIAGNOSIS — R0789 Other chest pain: Secondary | ICD-10-CM | POA: Diagnosis present

## 2018-05-16 LAB — CBC WITH DIFFERENTIAL/PLATELET
Abs Immature Granulocytes: 0.1 10*3/uL (ref 0.0–0.1)
Basophils Absolute: 0 10*3/uL (ref 0.0–0.1)
Basophils Relative: 0 %
Eosinophils Absolute: 0.1 10*3/uL (ref 0.0–0.7)
Eosinophils Relative: 1 %
HCT: 29.6 % — ABNORMAL LOW (ref 39.0–52.0)
Hemoglobin: 10.2 g/dL — ABNORMAL LOW (ref 13.0–17.0)
Immature Granulocytes: 0 %
Lymphocytes Relative: 23 %
Lymphs Abs: 2.9 10*3/uL (ref 0.7–4.0)
MCH: 31.4 pg (ref 26.0–34.0)
MCHC: 34.5 g/dL (ref 30.0–36.0)
MCV: 91.1 fL (ref 78.0–100.0)
Monocytes Absolute: 1.1 10*3/uL — ABNORMAL HIGH (ref 0.1–1.0)
Monocytes Relative: 9 %
Neutro Abs: 8.3 10*3/uL — ABNORMAL HIGH (ref 1.7–7.7)
Neutrophils Relative %: 67 %
Platelets: 452 10*3/uL — ABNORMAL HIGH (ref 150–400)
RBC: 3.25 MIL/uL — ABNORMAL LOW (ref 4.22–5.81)
RDW: 15.9 % — ABNORMAL HIGH (ref 11.5–15.5)
WBC: 12.5 10*3/uL — ABNORMAL HIGH (ref 4.0–10.5)

## 2018-05-16 LAB — COMPREHENSIVE METABOLIC PANEL
ALT: 15 U/L — ABNORMAL LOW (ref 17–63)
AST: 27 U/L (ref 15–41)
Albumin: 4 g/dL (ref 3.5–5.0)
Alkaline Phosphatase: 77 U/L (ref 38–126)
Anion gap: 11 (ref 5–15)
BUN: 7 mg/dL (ref 6–20)
CO2: 25 mmol/L (ref 22–32)
Calcium: 9.3 mg/dL (ref 8.9–10.3)
Chloride: 100 mmol/L — ABNORMAL LOW (ref 101–111)
Creatinine, Ser: 0.7 mg/dL (ref 0.61–1.24)
GFR calc Af Amer: >60 mL/min (ref >60)
GFR calc non Af Amer: >60 mL/min (ref >60)
Glucose, Bld: 151 mg/dL — ABNORMAL HIGH (ref 65–99)
Potassium: 3.7 mmol/L (ref 3.5–5.1)
Sodium: 136 mmol/L (ref 135–145)
Total Bilirubin: 2.8 mg/dL — ABNORMAL HIGH (ref 0.3–1.2)
Total Protein: 7.3 g/dL (ref 6.5–8.1)

## 2018-05-16 LAB — RETICULOCYTES
RBC.: 3.25 MIL/uL — ABNORMAL LOW (ref 4.22–5.81)
Retic Count, Absolute: 159.3 10*3/uL (ref 19.0–186.0)
Retic Ct Pct: 4.9 % — ABNORMAL HIGH (ref 0.4–3.1)

## 2018-05-16 MED ORDER — HYDROXYUREA 500 MG PO CAPS
1000.0000 mg | ORAL_CAPSULE | Freq: Every day | ORAL | Status: DC
  Administered 2018-05-16 – 2018-05-17 (×2): 1000 mg via ORAL
  Filled 2018-05-16 (×5): qty 2

## 2018-05-16 MED ORDER — DIPHENHYDRAMINE HCL 25 MG PO CAPS: 25.0000 mg | ORAL_CAPSULE | ORAL | Status: DC | PRN

## 2018-05-16 MED ORDER — MORPHINE SULFATE 2 MG/ML IV SOLN
INTRAVENOUS | Status: DC
  Filled 2018-05-16: qty 30

## 2018-05-16 MED ORDER — ONDANSETRON HCL 4 MG/2ML IJ SOLN: 4.0000 mg | Freq: Four times a day (QID) | INTRAMUSCULAR | Status: DC | PRN

## 2018-05-16 MED ORDER — HYDROXYUREA 300 MG PO CAPS: 1600.0000 mg | ORAL_CAPSULE | Freq: Every day | ORAL | Status: DC

## 2018-05-16 MED ORDER — IOPAMIDOL (ISOVUE-370) INJECTION 76%
INTRAVENOUS | Status: AC
  Filled 2018-05-16: qty 100

## 2018-05-16 MED ORDER — ONDANSETRON HCL 4 MG/2ML IJ SOLN: 4.0000 mg | INTRAMUSCULAR | Status: DC | PRN

## 2018-05-16 MED ORDER — SODIUM CHLORIDE 0.9% FLUSH: 9.0000 mL | INTRAVENOUS | Status: DC | PRN

## 2018-05-16 MED ORDER — HYDROMORPHONE 1 MG/ML IV SOLN
INTRAVENOUS | Status: DC
  Filled 2018-05-16: qty 25

## 2018-05-16 MED ORDER — HYDROMORPHONE HCL 1 MG/ML IJ SOLN: 2.0000 mg | INTRAMUSCULAR | Status: AC

## 2018-05-16 MED ORDER — IOPAMIDOL (ISOVUE-370) INJECTION 76%
50.0000 mL | Freq: Once | INTRAVENOUS | Status: AC | PRN
  Administered 2018-05-16: 50 mL via INTRAVENOUS

## 2018-05-16 MED ORDER — SODIUM CHLORIDE 0.9 % IV SOLN
500.0000 mg | INTRAVENOUS | Status: DC
  Administered 2018-05-17: 500 mg via INTRAVENOUS
  Filled 2018-05-16 (×2): qty 500

## 2018-05-16 MED ORDER — VANCOMYCIN HCL 10 G IV SOLR
1500.0000 mg | Freq: Once | INTRAVENOUS | Status: AC
  Administered 2018-05-16: 1500 mg via INTRAVENOUS
  Filled 2018-05-16: qty 1500

## 2018-05-16 MED ORDER — SODIUM CHLORIDE 0.9% FLUSH
3.0000 mL | Freq: Two times a day (BID) | INTRAVENOUS | Status: DC
  Administered 2018-05-17: 3 mL via INTRAVENOUS

## 2018-05-16 MED ORDER — HYDROXYUREA 500 MG PO CAPS
ORAL_CAPSULE | Freq: Every day | ORAL | Status: DC
  Filled 2018-05-16: qty 3

## 2018-05-16 MED ORDER — VANCOMYCIN HCL IN DEXTROSE 1-5 GM/200ML-% IV SOLN: 1000.0000 mg | Freq: Three times a day (TID) | INTRAVENOUS | Status: DC

## 2018-05-16 MED ORDER — HYDROMORPHONE HCL 1 MG/ML IJ SOLN
2.0000 mg | INTRAMUSCULAR | Status: DC
  Filled 2018-05-16: qty 2

## 2018-05-16 MED ORDER — HYDROMORPHONE HCL 1 MG/ML IJ SOLN: 2.0000 mg | INTRAMUSCULAR | Status: DC

## 2018-05-16 MED ORDER — SODIUM CHLORIDE 0.9 % IV SOLN
25.0000 mg | INTRAVENOUS | Status: DC | PRN
  Filled 2018-05-16: qty 0.5

## 2018-05-16 MED ORDER — SENNA 8.6 MG PO TABS
1.0000 | ORAL_TABLET | Freq: Two times a day (BID) | ORAL | Status: DC
  Administered 2018-05-16 – 2018-05-17 (×3): 8.6 mg via ORAL
  Filled 2018-05-16 (×3): qty 1

## 2018-05-16 MED ORDER — SODIUM CHLORIDE 0.9 % IV SOLN
2.0000 g | INTRAVENOUS | Status: DC
  Administered 2018-05-16 – 2018-05-17 (×2): 2 g via INTRAVENOUS
  Filled 2018-05-16 (×2): qty 2

## 2018-05-16 MED ORDER — HYDROMORPHONE HCL 1 MG/ML IJ SOLN
2.0000 mg | INTRAMUSCULAR | Status: DC
  Administered 2018-05-16: 2 mg via INTRAVENOUS

## 2018-05-16 MED ORDER — SODIUM CHLORIDE 0.9 % IV SOLN
500.0000 mg | INTRAVENOUS | Status: DC
  Administered 2018-05-16: 500 mg via INTRAVENOUS

## 2018-05-16 MED ORDER — NALOXONE HCL 0.4 MG/ML IJ SOLN: 0.4000 mg | INTRAMUSCULAR | Status: DC | PRN

## 2018-05-16 MED ORDER — PROMETHAZINE HCL 25 MG PO TABS: 12.5000 mg | ORAL_TABLET | Freq: Four times a day (QID) | ORAL | Status: DC | PRN

## 2018-05-16 MED ORDER — KETOROLAC TROMETHAMINE 30 MG/ML IJ SOLN: 30.0000 mg | Freq: Four times a day (QID) | INTRAMUSCULAR | Status: DC | PRN

## 2018-05-16 MED ORDER — DEXTROSE-NACL 5-0.45 % IV SOLN
INTRAVENOUS | Status: DC
  Administered 2018-05-16 – 2018-05-17 (×2): via INTRAVENOUS

## 2018-05-16 MED ORDER — ENOXAPARIN SODIUM 40 MG/0.4ML ~~LOC~~ SOLN
40.0000 mg | SUBCUTANEOUS | Status: DC
  Administered 2018-05-16: 40 mg via SUBCUTANEOUS
  Filled 2018-05-16: qty 0.4

## 2018-05-16 MED ORDER — HYDROMORPHONE HCL 1 MG/ML IJ SOLN
2.0000 mg | INTRAMUSCULAR | Status: AC
  Administered 2018-05-16: 2 mg via INTRAVENOUS

## 2018-05-16 MED ORDER — VANCOMYCIN HCL IN DEXTROSE 1-5 GM/200ML-% IV SOLN: 1000.0000 mg | Freq: Once | INTRAVENOUS | Status: DC

## 2018-05-16 MED ORDER — CEFTRIAXONE SODIUM 2 G IJ SOLR
2.0000 g | INTRAMUSCULAR | Status: DC
  Filled 2018-05-16: qty 20

## 2018-05-16 MED ORDER — HYDROXYUREA 200 MG PO CAPS
600.0000 mg | ORAL_CAPSULE | Freq: Every day | ORAL | Status: DC
  Administered 2018-05-16 – 2018-05-17 (×2): 600 mg via ORAL
  Filled 2018-05-16 (×3): qty 3

## 2018-05-16 MED ORDER — SODIUM CHLORIDE 0.9 % IV SOLN
1.0000 g | Freq: Three times a day (TID) | INTRAVENOUS | Status: DC
  Filled 2018-05-16 (×2): qty 1

## 2018-05-16 MED ORDER — KETOROLAC TROMETHAMINE 30 MG/ML IJ SOLN
30.0000 mg | INTRAMUSCULAR | Status: AC
  Administered 2018-05-16: 30 mg via INTRAVENOUS
  Filled 2018-05-16: qty 1

## 2018-05-16 MED ORDER — SODIUM CHLORIDE 0.9 % IV SOLN
2.0000 g | Freq: Once | INTRAVENOUS | Status: AC
  Administered 2018-05-16: 2 g via INTRAVENOUS
  Filled 2018-05-16: qty 2

## 2018-05-16 MED ORDER — MORPHINE SULFATE 2 MG/ML IV SOLN
INTRAVENOUS | Status: DC
  Administered 2018-05-16 – 2018-05-17 (×2): 2 mg via INTRAVENOUS
  Administered 2018-05-17: 9 mg via INTRAVENOUS
  Administered 2018-05-17: 6 mg via INTRAVENOUS
  Filled 2018-05-16: qty 25

## 2018-05-16 MED ORDER — LISINOPRIL 10 MG PO TABS
10.0000 mg | ORAL_TABLET | Freq: Every day | ORAL | Status: DC
  Administered 2018-05-16 – 2018-05-17 (×2): 10 mg via ORAL
  Filled 2018-05-16 (×2): qty 1

## 2018-05-16 NOTE — Progress Notes (Signed)
Pharmacy Antibiotic Note  [name] Hill is a 17 y.o. male admitted on 05/16/2018 with pneumonia.  Pharmacy has been consulted for cefepime and vancomycin dosing.  Plan: Continue cefepime 1g IV Q8h Start vancomycin 1g IV Q8h Monitor clinical picture, renal function, VT prn F/U C&S, abx deescalation / LOT  Height: 5\' 6"  (167.6 cm) Weight: 184 lb (83.5 kg) IBW/kg (Calculated) : 63.8  Temp (24hrs), Avg:98.2 F (36.8 C), Min:98.2 F (36.8 C), Max:98.2 F (36.8 C)  Recent Labs  Lab 05/16/18 1147  WBC 12.5*  CREATININE 0.70    Estimated Creatinine Clearance: 151.9 mL/min (by C-G formula based on SCr of 0.7 mg/dL).    Allergies  Allergen Reactions  . Other Cough    Seasonal allergies.    Thank you for allowing pharmacy to be a part of this patient's care.  Armandina StammerBATCHELDER,[name] Hill 05/16/2018 2:55 PM

## 2018-05-16 NOTE — ED Triage Notes (Signed)
Pt endorses sickle cell crisis with right sided rib pain radiating down the right side with difficulty breathing. Pt took oxycodone with short term relief. VSS. Breath sounds clear.

## 2018-05-16 NOTE — ED Notes (Signed)
ED Provider at bedside. 

## 2018-05-16 NOTE — H&P (Signed)
H&P  Patient Demographics:  [name] Hill, is a 17 y.o. male  MRN: [id number]   DOB - [birth year]  Admit Date - 05/16/2018  Outpatient Primary MD for the patient is Maree Erie, MD  Chief Complaint  Patient presents with  . Sickle Cell Pain Crisis      HPI:   [name] Hill  is a 17 y.o. male with medical history significant for sickle cell nephropathy, asthma, and sickle cell anemia, who presented to the ED today with major complain of right sided chest pain which is worse with deep breathing and feels tight when he stretches. He feels some shortness of breath initially but now a little improved and breathing well even without oxygen. He denies pain in any other part of his body. He denies any fever. No cough or wheezing. No history of smoking. He denies any urinary symptom, no nausea or vomiting, no diarrhea or constipation.  ED Course: In the ED, he was found to be afebrile, saturating well on room air, CXR showed mild bibasilar opacification which may be due to atelectasis or early infection, CT Angio showed "No evidence of pulmonary embolism. Airspace consolidation in the right lower lobe which likely represents a manifestation of acute chest syndrome in this patient with history of sickle cell disease. Typically, these findings would be seen in the setting of pneumonia or recent aspiration. Trace right pleural effusion lying dependently. Scarring in the left lower lobe, presumably from prior infection/infarction. And Mild cardiomegaly". Chemistry panel and CBC were unremarkable. Temperature was normal as are other vitals signs. He was hydrated and started on IV Cefepime and Vancomycin with a presumptive diagnosis of Pneumonia Vs ACS.Patient was stable hemodynamically and in no acute distress.   Patient will be admitted for Acute Chest Syndrome and Sickle Cell Pain  Crisis. Transfer to [facility] from [facility] ED   Review of systems:  In addition to the HPI above, patient reports No  fever or chills No Headache, No changes with vision or hearing No problems swallowing food or liquids No chest pain, cough or shortness of breath No Abdominal pain, No Nausea or Vomiting, Bowel movements are regular No blood in stool or urine No dysuria No new skin rashes or bruises No new joints pains-aches No new weakness, tingling, numbness in any extremity No recent weight gain or loss No polyuria, polydypsia or polyphagia No significant Mental Stressors  A full 10 point Review of Systems was done, except as stated above, all other Review of Systems were negative.  With Past History of the following :   Past Medical History:  Diagnosis Date  . 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 Blurred vision   . Sickle cell anemia (HCC)   . Sickle cell crisis (HCC)   . Sickle cell nephropathy (HCC)       Past Surgical History:  Procedure Laterality Date  . CIRCUMCISION    . TONSILLECTOMY    . TONSILLECTOMY AND ADENOIDECTOMY       Social History:   Social History   Tobacco Use  . Smoking status: Never Smoker  . Smokeless tobacco: Never Used  Substance Use Topics  . Alcohol use: No     Lives - At home   Family History :   Family History  Problem Relation Age of Onset  . Sickle cell anemia Brother   . Hypertension Maternal Grandmother   . Anemia Mother  Home Medications:   Prior to Admission medications   Medication Sig Start Date End Date Taking? Authorizing Provider  acetaminophen (TYLENOL) 650 MG CR tablet Take 1,300 mg by mouth every 8 (eight) hours as needed for pain.   Yes [provider]  albuterol (PROVENTIL HFA;VENTOLIN HFA) 108 (90 Base) MCG/ACT inhaler Inhale 2 puffs into the lungs every 4 (four) hours as needed for wheezing or shortness of breath. 10/05/16  Yes [name], MD  hydroxyurea (DROXIA) 400 MG capsule Take 4 capsules (1,600 mg total) by mouth daily.  10/05/16  Yes [name], MD  lisinopril (PRINIVIL,ZESTRIL) 10 MG tablet Take 1 tablet (10 mg total) by mouth daily. 10/05/16  Yes [name], MD  oxycodone (OXY-IR) 5 MG capsule Take 1 capsule (5 mg total) by mouth every 6 (six) hours as needed. Patient taking differently: Take 10 mg by mouth every 6 (six) hours as needed.  03/14/18  Yes Arby Barrette, MD  loperamide (IMODIUM) 2 MG capsule Take 1 capsule (2 mg total) 4 (four) times daily as needed by mouth for diarrhea or loose stools. Patient not taking: Reported on 05/16/2018 10/05/17   Audry Pili, PA-C  oxyCODONE (OXY IR/ROXICODONE) 5 MG immediate release tablet Take 1 tablet (5 mg total) by mouth every 6 (six) hours. As needed for pain Patient not taking: Reported on 05/16/2018 11/06/17   Altha Harm, MD     Allergies:   Allergies  Allergen Reactions  . Other Cough    Seasonal allergies.    Physical Exam:   Vitals:   Vitals:   05/16/18 1444 05/16/18 1500  BP: (!) 108/54 108/68  Pulse: 69 69  Resp: (!) 21 (!) 26  Temp:    SpO2: 95% 96%    Physical Exam: Constitutional: Patient appears well-developed and well-nourished. Not in obvious distress. HENT: Normocephalic, atraumatic, External right and left ear normal. Oropharynx is clear and moist.  Eyes: Conjunctivae and EOM are normal. PERRLA, no scleral icterus. Neck: Normal ROM. Neck supple. No JVD. No tracheal deviation. No thyromegaly. CVS: RRR, S1/S2 +, no murmurs, no gallops, no carotid bruit.  Pulmonary: Effort and breath sounds normal, no stridor, rhonchi, wheezes, rales.  Abdominal: Soft. BS +, no distension, tenderness, rebound or guarding.  Musculoskeletal: Normal range of motion. No edema and no tenderness.  Lymphadenopathy: No lymphadenopathy noted, cervical, inguinal or axillary Neuro: Alert. Normal reflexes, muscle tone coordination. No cranial nerve deficit. Skin: Skin is warm and dry. No rash noted. Not diaphoretic. No erythema. No  pallor. Psychiatric: Normal mood and affect. Behavior, judgment, thought content normal.   Data Review:   CBC Recent Labs  Lab 05/16/18 1147  WBC 12.5*  HGB 10.2*  HCT 29.6*  PLT 452*  MCV 91.1  MCH 31.4  MCHC 34.5  RDW 15.9*  LYMPHSABS 2.9  MONOABS 1.1*  EOSABS 0.1  BASOSABS 0.0   ------------------------------------------------------------------------------------------------------------------  Chemistries  Recent Labs  Lab 05/16/18 1147  NA 136  K 3.7  CL 100*  CO2 25  GLUCOSE 151*  BUN 7  CREATININE 0.70  CALCIUM 9.3  AST 27  ALT 15*  ALKPHOS 77  BILITOT 2.8*   ------------------------------------------------------------------------------------------------------------------ estimated creatinine clearance is 151.9 mL/min (by C-G formula based on SCr of 0.7 mg/dL). ------------------------------------------------------------------------------------------------------------------ No results for input(s): TSH, T4TOTAL, T3FREE, THYROIDAB in the last 72 hours.  Invalid input(s): FREET3  Coagulation profile No results for input(s): INR, PROTIME in the last 168 hours. ------------------------------------------------------------------------------------------------------------------- No results for input(s): DDIMER in the last 72 hours. -------------------------------------------------------------------------------------------------------------------  Cardiac Enzymes No results for input(s): CKMB, TROPONINI, MYOGLOBIN in the last 168 hours.  Invalid input(s): CK ------------------------------------------------------------------------------------------------------------------ No results found for: BNP  ---------------------------------------------------------------------------------------------------------------  Urinalysis    Component Value Date/Time   COLORURINE YELLOW 03/14/2018 1200   APPEARANCEUR CLEAR 03/14/2018 1200   LABSPEC 1.009 03/14/2018 1200    PHURINE 7.0 03/14/2018 1200   GLUCOSEU NEGATIVE 03/14/2018 1200   HGBUR SMALL (A) 03/14/2018 1200   BILIRUBINUR NEGATIVE 03/14/2018 1200   KETONESUR NEGATIVE 03/14/2018 1200   PROTEINUR 100 (A) 03/14/2018 1200   UROBILINOGEN 2.0 (H) 07/07/2015 0738   NITRITE NEGATIVE 03/14/2018 1200   LEUKOCYTESUR NEGATIVE 03/14/2018 1200    ----------------------------------------------------------------------------------------------------------------   Imaging Results:    Dg Chest 2 View  Result Date: 05/16/2018 CLINICAL DATA:  Right-sided rib pain which shortness-of-breath. Sickle cell crisis. EXAM: CHEST - 2 VIEW COMPARISON:  04/30/2018 FINDINGS: Lungs are adequately inflated with mild opacification over the right base and medial left retrocardiac region which may be due to atelectasis versus early infection. No effusion. Cardiomediastinal silhouette and remainder of the exam is unchanged. IMPRESSION: Mild bibasilar opacification which may be due to atelectasis or early infection. Electronically Signed   By: Elberta Fortisaniel  Boyle M.D.   On: 05/16/2018 12:31   Ct Angio Chest Pe W And/or Wo Contrast  Result Date: 05/16/2018 CLINICAL DATA:  17 year old male with history of chest pain and shortness of breath for the past 2 days. EXAM: CT ANGIOGRAPHY CHEST WITH CONTRAST TECHNIQUE: Multidetector CT imaging of the chest was performed using the standard protocol during bolus administration of intravenous contrast. Multiplanar CT image reconstructions and MIPs were obtained to evaluate the vascular anatomy. CONTRAST:  50mL ISOVUE-370 IOPAMIDOL (ISOVUE-370) INJECTION 76% COMPARISON:  No priors. FINDINGS: Cardiovascular: No filling defects within the pulmonary arterial tree to suggest underlying pulmonary embolism. Heart size is mildly enlarged. There is no significant pericardial fluid, thickening or pericardial calcification. No atherosclerotic calcifications are noted in the thoracic aorta or the coronary arteries.  Mediastinum/Nodes: No pathologically enlarged mediastinal or hilar lymph nodes. Esophagus is unremarkable in appearance. No axillary lymphadenopathy. Lungs/Pleura: Patchy airspace consolidation in the right lower lobe, concerning for bronchopneumonia or sequela of aspiration. Mild scarring in the left lower lobe dependently. Trace right pleural effusion lying dependently. 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 Musculoskeletal: H-shaped vertebral bodies, compatible with reported clinical history of sickle cell disease. There are no aggressive appearing lytic or blastic lesions noted in the visualized portions of the skeleton. Review of the MIP images confirms the above findings. IMPRESSION: 1. No evidence of pulmonary embolism. 2. Airspace consolidation in the right lower lobe which likely represents a manifestation of acute chest syndrome in this patient with history of sickle cell disease. Typically, these findings would be seen in the setting of pneumonia or recent aspiration. 3. Trace right pleural effusion lying dependently. 4. Scarring in the left lower lobe, presumably from prior infection/infarction. 5. Mild cardiomegaly. Electronically Signed   By: Trudie Reedaniel  Entrikin M.D.   On: 05/16/2018 14:11    Assessment & Plan:  Active Problems:   Sickle cell pain crisis (HCC)   Acute chest syndrome (HCC)  1. Acute Chest Syndrome: Patient has right sided chest pain + CTA showing new infiltrates, although patient has no hypoxemia, no fever and no other red herrings at this time. Will start patient on IVF, IV Ceftriaxone and IV Azithromycin. Discontinue IV Cefepime and Vancomycin. Oxygen and incentive spirometry 2. Hb Sickle Cell Disease with crisis: IVF D5 .45% Saline @  125 mls/hour, Weight based Morphine PCA started within 30 minutes of admission, IV Toradol 30 mg Q 6 H, Monitor vitals very closely, Re-evaluate pain scale regularly, 2 L of Oxygen by [place], Patient will be  re-evaluated for pain in the context of function and relationship to baseline as care progresses. 3. Leukocytosis: WBC is lower than it was 2 weeks ago, no fever, Patient on antibiotics for ACS. Will continue to monitor. 4. Sickle Cell Anemia: Hb appears stable at this time. No indication for blood transfusion or exchange transfusion.  However, this may change depending on patient's symptoms. Will monitor closely. 5. Sickle Cell Nephropathy: Continue Lisinopril   DVT Prophylaxis: Subcut Lovenox   AM Labs Ordered, also please review Full Orders  Family Communication: Admission, patient's condition and plan of care including tests being ordered have been discussed with the patient who indicate understanding and agree with the plan and Code Status.  Code Status: Full Code  Consults called: None    Admission status: Inpatient    Time spent in minutes : 50 minutes  Jeanann Lewandowsky MD, MHA, CPE, FACP 05/16/2018 at 4:45 PM

## 2018-05-16 NOTE — ED Provider Notes (Signed)
Emergency Department Provider Note   I have reviewed the triage vital signs and the nursing notes.   HISTORY  Chief Complaint Sickle Cell Pain Crisis   HPI [name] Hill is a 17 y.o. male with PMH of SS disease and acute chest presents to the emergency department for evaluation of severe, right-sided chest pain which extends from the right lateral chest down to the lower chest and back.  Patient has baseline severe pain that is worse with deep breathing.  He feels some shortness of breath.  She reports having left-sided symptoms that were similar in the past resolved without intervention.  No fevers or chills.  No anterior or left-sided chest pain.  No syncope or near syncope.  Patient has been taking oxycodone at home with no relief in symptoms.   Past Medical History:  Diagnosis Date  . 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 Blurred vision   . Sickle cell anemia (HCC)   . Sickle cell crisis (HCC)   . Sickle cell nephropathy [facility])     Patient Active Problem List   Diagnosis Date Noted  . Leukocytosis   . Transaminitis   . 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 Family circumstance 07/03/2016  . 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 Asthma 06/03/2012  . Abnormal presence of protein in urine 06/03/2012  . 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 CIRCUMCISION    . TONSILLECTOMY    . TONSILLECTOMY AND ADENOIDECTOMY      Allergies Other  Family History  Problem Relation Age of Onset  . Sickle cell anemia Brother   . Hypertension Maternal Grandmother   . Anemia Mother     Social History Social History   Tobacco Use  . Smoking status: Never Smoker  . Smokeless tobacco: Never Used  Substance Use Topics  . Alcohol use: No  . Drug use: No    Review of Systems  Constitutional: No fever/chills Eyes:  No visual changes. ENT: No sore throat. Cardiovascular: Positive chest pain. Respiratory: Positive shortness of breath. Gastrointestinal: No abdominal pain.  No nausea, no vomiting.  No diarrhea.  No constipation. Genitourinary: Negative for dysuria. Musculoskeletal: Negative for back pain. Skin: Negative for rash. Neurological: Negative for headaches, focal weakness or numbness.  10-point ROS otherwise negative.  ____________________________________________   PHYSICAL EXAM:  VITAL SIGNS: ED Triage Vitals  Enc Vitals Group     BP 05/16/18 1132 131/78     Pulse Rate 05/16/18 1132 97     Resp 05/16/18 1132 18     Temp 05/16/18 1132 98.2 F (36.8 C)     Temp Source 05/16/18 1132 Oral     SpO2 05/16/18 1132 97 %     Weight 05/16/18 1133 184 lb (83.5 kg)     Height 05/16/18 1133 5\' 6"  (1.676 m)     Pain Score 05/16/18 1132 6   Constitutional: Alert and oriented. Well appearing and in no acute distress. Eyes: Conjunctivae are normal. Head: Atraumatic. Nose: No congestion/rhinnorhea. Mouth/Throat: Mucous membranes are moist.   Neck: No stridor.  Cardiovascular: Normal rate, regular rhythm. Good peripheral circulation. Grossly normal heart sounds.   Respiratory: Normal respiratory effort.  No retractions. Lungs CTAB. Gastrointestinal: Soft and nontender. No distention.  Musculoskeletal: No lower extremity tenderness nor edema. No gross deformities of extremities. Tenderness to palpation  over the right lateral chest wall.  Neurologic:  Normal speech and language. No gross focal neurologic deficits are appreciated.  Skin:  Skin is warm, dry and intact. No rash noted.  ____________________________________________   LABS (all labs ordered are listed, but only abnormal results are displayed)  Labs Reviewed  COMPREHENSIVE METABOLIC PANEL - Abnormal; Notable for the following components:      Result Value   Chloride 100 (*)    Glucose, Bld 151 (*)    ALT 15 (*)    Total  Bilirubin 2.8 (*)    All other components within normal limits  CBC WITH DIFFERENTIAL/PLATELET - Abnormal; Notable for the following components:   WBC 12.5 (*)    RBC 3.25 (*)    Hemoglobin 10.2 (*)    HCT 29.6 (*)    RDW 15.9 (*)    Platelets 452 (*)    Neutro Abs 8.3 (*)    Monocytes Absolute 1.1 (*)    All other components within normal limits  RETICULOCYTES - Abnormal; Notable for the following components:   Retic Ct Pct 4.9 (*)    RBC. 3.25 (*)    All other components within normal limits  CULTURE, BLOOD (ROUTINE X 2)  CULTURE, BLOOD (ROUTINE X 2)   ____________________________________________  EKG   EKG Interpretation  Date/Time:  Saturday May 16 2018 11:32:24 EDT Ventricular Rate:  83 PR Interval:  194 QRS Duration: 102 QT Interval:  356 QTC Calculation: 418 R Axis:   86 Text Interpretation:  Normal sinus rhythm Nonspecific T wave abnormality Abnormal ECG No STEMI.  Similar to prior tracing.  Confirmed by Alona Bene [phone]) on 05/16/2018 12:26:32 PM       ____________________________________________  RADIOLOGY  Dg Chest 2 View  Result Date: 05/16/2018 CLINICAL DATA:  Right-sided rib pain which shortness-of-breath. Sickle cell crisis. EXAM: CHEST - 2 VIEW COMPARISON:  04/30/2018 FINDINGS: Lungs are adequately inflated with mild opacification over the right base and medial left retrocardiac region which may be due to atelectasis versus early infection. No effusion. Cardiomediastinal silhouette and remainder of the exam is unchanged. IMPRESSION: Mild bibasilar opacification which may be due to atelectasis or early infection. Electronically Signed   By: Elberta Fortis M.D.   On: 05/16/2018 12:31   Ct Angio Chest Pe W And/or Wo Contrast  Result Date: 05/16/2018 CLINICAL DATA:  17 year old male with history of chest pain and shortness of breath for the past 2 days. EXAM: CT ANGIOGRAPHY CHEST WITH CONTRAST TECHNIQUE: Multidetector CT imaging of the chest was performed  using the standard protocol during bolus administration of intravenous contrast. Multiplanar CT image reconstructions and MIPs were obtained to evaluate the vascular anatomy. CONTRAST:  50mL ISOVUE-370 IOPAMIDOL (ISOVUE-370) INJECTION 76% COMPARISON:  No priors. FINDINGS: Cardiovascular: No filling defects within the pulmonary arterial tree to suggest underlying pulmonary embolism. Heart size is mildly enlarged. There is no significant pericardial fluid, thickening or pericardial calcification. No atherosclerotic calcifications are noted in the thoracic aorta or the coronary arteries. Mediastinum/Nodes: No pathologically enlarged mediastinal or hilar lymph nodes. Esophagus is unremarkable in appearance. No axillary lymphadenopathy. Lungs/Pleura: Patchy airspace consolidation in the right lower lobe, concerning for bronchopneumonia or sequela of aspiration. Mild scarring in the left lower lobe dependently. Trace right pleural effusion lying dependently. 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 Musculoskeletal: H-shaped vertebral bodies, compatible with reported clinical history of sickle cell disease. There are no aggressive appearing lytic or blastic lesions noted in the visualized portions of the  skeleton. Review of the MIP images confirms the above findings. IMPRESSION: 1. No evidence of pulmonary embolism. 2. Airspace consolidation in the right lower lobe which likely represents a manifestation of acute chest syndrome in this patient with history of sickle cell disease. Typically, these findings would be seen in the setting of pneumonia or recent aspiration. 3. Trace right pleural effusion lying dependently. 4. Scarring in the left lower lobe, presumably from prior infection/infarction. 5. Mild cardiomegaly. Electronically Signed   By: Trudie Reedaniel  Entrikin M.D.   On: 05/16/2018 14:11    ____________________________________________   PROCEDURES  Procedure(s) performed:    .Critical Care Performed by: Maia PlanLong, Madinah Quarry G, MD Authorized by: Maia PlanLong, Cimone Fahey G, MD   Critical care provider statement:    Critical care time (minutes):  35   Critical care time was exclusive of:  Separately billable procedures and treating other patients and teaching time   Critical care was necessary to treat or prevent imminent or life-threatening deterioration of the following conditions:  Respiratory failure and dehydration   Critical care was time spent personally by me on the following activities:  Blood draw for specimens, development of treatment plan with patient or surrogate, discussions with consultants, evaluation of patient's response to treatment, examination of patient, obtaining history from patient or surrogate, ordering and performing treatments and interventions, ordering and review of laboratory studies, ordering and review of radiographic studies, pulse oximetry, re-evaluation of patient's condition and review of old charts     ____________________________________________   INITIAL IMPRESSION / ASSESSMENT AND PLAN / ED COURSE  Pertinent labs & imaging results that were available during my care of the patient were reviewed by me and considered in my medical decision making (see chart for details).  Patient presents to the emergency department for evaluation of right-sided chest pain with dyspnea in the setting of sickle cell crisis type pain.  No fever.  No tachycardia or hypoxemia.  Patient describes pleuritic chest pain with shortness of breath.  Patient has increased risk for PE.  Baseline labs are unremarkable.  EKG is similar to prior. CXR obtained from triage. Plan for CTA to evaluate for PE.   CTA shows an infiltrate on the right. No hypoxemia or fever. Will start abx and discuss CT findings with Dr. Hyman HopesJegede on call. Labs otherwise normal.   Discussed patient's case with Dr. Hyman HopesJegede to request admission. Patient and family (if present) updated with plan. Care  transferred to Sickle Cell service.  I reviewed all nursing notes, vitals, pertinent old records, EKGs, labs, imaging (as available).  ____________________________________________  FINAL CLINICAL IMPRESSION(S) / ED DIAGNOSES  Final diagnoses:  Acute chest pain  Sickle cell pain crisis (HCC)     MEDICATIONS GIVEN DURING THIS VISIT:  Medications  HYDROmorphone (DILAUDID) injection 2 mg (2 mg Intravenous Not Given 05/16/18 1351)    Or  HYDROmorphone (DILAUDID) injection 2 mg ( Subcutaneous See Alternative 05/16/18 1351)  HYDROmorphone (DILAUDID) injection 2 mg (2 mg Intravenous Not Given 05/16/18 1407)    Or  HYDROmorphone (DILAUDID) injection 2 mg ( Subcutaneous See Alternative 05/16/18 1407)  HYDROmorphone (DILAUDID) injection 2 mg (2 mg Intravenous Given 05/16/18 1518)    Or  HYDROmorphone (DILAUDID) injection 2 mg ( Subcutaneous See Alternative 05/16/18 1518)  diphenhydrAMINE (BENADRYL) capsule 25-50 mg (has no administration in time range)  ondansetron (ZOFRAN) injection 4 mg (has no administration in time range)  iopamidol (ISOVUE-370) 76 % injection (has no administration in time range)  vancomycin (VANCOCIN) 1,500 mg in sodium chloride 0.9 %  500 mL IVPB (1,500 mg Intravenous New Bag/Given 05/16/18 1559)  ceFEPIme (MAXIPIME) 1 g in sodium chloride 0.9 % 100 mL IVPB (has no administration in time range)  vancomycin (VANCOCIN) IVPB 1000 mg/200 mL premix (has no administration in time range)  ketorolac (TORADOL) 30 MG/ML injection 30 mg (30 mg Intravenous Given 05/16/18 1228)  HYDROmorphone (DILAUDID) injection 2 mg (2 mg Intravenous Given 05/16/18 1309)    Or  HYDROmorphone (DILAUDID) injection 2 mg ( Subcutaneous See Alternative 05/16/18 1309)  iopamidol (ISOVUE-370) 76 % injection 50 mL (50 mLs Intravenous Contrast Given 05/16/18 1252)  ceFEPIme (MAXIPIME) 2 g in sodium chloride 0.9 % 100 mL IVPB (0 g Intravenous Stopped 05/16/18 1536)    Note:  This document was prepared using Dragon  voice recognition software and may include unintentional dictation errors.  Alona Bene, MD Emergency Medicine    Edelyn Heidel, Arlyss Repress, MD 05/16/18 [id number]

## 2018-05-16 NOTE — ED Notes (Signed)
Got patient into a gown on the monitor patient is resting with call bell in reach 

## 2018-05-17 ENCOUNTER — Other Ambulatory Visit: Payer: Self-pay

## 2018-05-17 LAB — COMPREHENSIVE METABOLIC PANEL
ALT: 12 U/L — ABNORMAL LOW (ref 17–63)
AST: 19 U/L (ref 15–41)
Albumin: 3.7 g/dL (ref 3.5–5.0)
Alkaline Phosphatase: 69 U/L (ref 38–126)
Anion gap: 7 (ref 5–15)
BUN: 7 mg/dL (ref 6–20)
CO2: 27 mmol/L (ref 22–32)
Calcium: 8.7 mg/dL — ABNORMAL LOW (ref 8.9–10.3)
Chloride: 104 mmol/L (ref 101–111)
Creatinine, Ser: 0.57 mg/dL — ABNORMAL LOW (ref 0.61–1.24)
GFR calc Af Amer: >60 mL/min (ref >60)
GFR calc non Af Amer: >60 mL/min (ref >60)
Glucose, Bld: 132 mg/dL — ABNORMAL HIGH (ref 65–99)
Potassium: 3.9 mmol/L (ref 3.5–5.1)
Sodium: 138 mmol/L (ref 135–145)
Total Bilirubin: 1.9 mg/dL — ABNORMAL HIGH (ref 0.3–1.2)
Total Protein: 6.8 g/dL (ref 6.5–8.1)

## 2018-05-17 LAB — CBC
HCT: 26.8 % — ABNORMAL LOW (ref 39.0–52.0)
Hemoglobin: 9.4 g/dL — ABNORMAL LOW (ref 13.0–17.0)
MCH: 32.1 pg (ref 26.0–34.0)
MCHC: 35.1 g/dL (ref 30.0–36.0)
MCV: 91.5 fL (ref 78.0–100.0)
Platelets: 434 10*3/uL — ABNORMAL HIGH (ref 150–400)
RBC: 2.93 MIL/uL — ABNORMAL LOW (ref 4.22–5.81)
RDW: 16.7 % — ABNORMAL HIGH (ref 11.5–15.5)
WBC: 11.5 10*3/uL — ABNORMAL HIGH (ref 4.0–10.5)

## 2018-05-17 LAB — HIV ANTIBODY (ROUTINE TESTING W REFLEX): HIV Screen 4th Generation wRfx: NONREACTIVE

## 2018-05-17 MED ORDER — MORPHINE SULFATE 2 MG/ML IV SOLN
INTRAVENOUS | Status: DC
  Administered 2018-05-17 – 2018-05-18 (×2): 2 mg via INTRAVENOUS
  Administered 2018-05-18: 1 mg via INTRAVENOUS
  Filled 2018-05-17: qty 30

## 2018-05-17 NOTE — Progress Notes (Signed)
An extra hydromorphone PCA was compounded through [facility] on 6/22 @1700  by AT and checked by LP. This PCA was not required and was placed back in the CII vault by the evening staff. It was wasted this morning in the sink in the main pharmacy by the cleanroom carousel (on the dirty side). Witnessed by Devota Pacearoline Welles.  Gwyndolyn KaufmanKai Stephane Niemann Bernette Redbird(Kenny), PharmD  Pharmacy Resident 05/17/2018 8:16 AM

## 2018-05-17 NOTE — Progress Notes (Signed)
Patient ID: [name] Hill, male   DOB: [birth year], 17 y.o.   MRN: [id number] Subjective:  Patient is doing much better today. He has no new complaint. No fever, chest pain is better, no SOB, no cough. Patient is on morphine PCA, says his pain is down to below 5/10. He asked if he could go home, but I want to observe him without the morphine.   Objective:  Vital signs in last 24 hours:  Vitals:   05/17/18 0136 05/17/18 0355 05/17/18 0543 05/17/18 1003  BP: 108/60  (!) 107/55   Pulse: 78  88   Resp: 20 (!) 24 18 18   Temp: 99.1 F (37.3 C)  (!) 97.5 F (36.4 C)   TempSrc:      SpO2: 96% 94% 96% 96%  Weight:      Height:        Intake/Output from previous day:   Intake/Output Summary (Last 24 hours) at 05/17/2018 1156 Last data filed at 05/16/2018 1536 Gross per 24 hour  Intake 86.02 ml  Output -  Net 86.02 ml    Physical Exam: General: Alert, awake, oriented x3, in no acute distress.  HEENT: [place]/AT PEERL, EOMI Neck: Trachea midline,  no masses, no thyromegal,y no JVD, no carotid bruit OROPHARYNX:  Moist, No exudate/ erythema/lesions.  Heart: Regular rate and rhythm, without murmurs, rubs, gallops, PMI non-displaced, no heaves or thrills on palpation.  Lungs: Clear to auscultation, no wheezing or rhonchi noted. No increased vocal fremitus resonant to percussion  Abdomen: Soft, nontender, nondistended, positive bowel sounds, no masses no hepatosplenomegaly noted..  Neuro: No focal neurological deficits noted cranial nerves II through XII grossly intact. DTRs 2+ bilaterally upper and lower extremities. Strength 5 out of 5 in bilateral upper and lower extremities. Musculoskeletal: No warm swelling or erythema around joints, no spinal tenderness noted. Psychiatric: Patient alert and oriented x3, good insight and cognition, good recent to remote recall. Lymph node survey: No cervical axillary or inguinal lymphadenopathy noted.  Lab Results:  Basic Metabolic Panel:    Component  Value Date/Time   NA 138 05/17/2018 0447   K 3.9 05/17/2018 0447   CL 104 05/17/2018 0447   CO2 27 05/17/2018 0447   BUN 7 05/17/2018 0447   CREATININE 0.57 (L) 05/17/2018 0447   GLUCOSE 132 (H) 05/17/2018 0447   CALCIUM 8.7 (L) 05/17/2018 0447   CBC:    Component Value Date/Time   WBC 11.5 (H) 05/17/2018 0447   HGB 9.4 (L) 05/17/2018 0447   HCT 26.8 (L) 05/17/2018 0447   PLT 434 (H) 05/17/2018 0447   MCV 91.5 05/17/2018 0447   NEUTROABS 8.3 (H) 05/16/2018 1147   LYMPHSABS 2.9 05/16/2018 1147   MONOABS 1.1 (H) 05/16/2018 1147   EOSABS 0.1 05/16/2018 1147   BASOSABS 0.0 05/16/2018 1147    No results found for this or any previous visit (from the past 240 hour(s)).  Studies/Results: Dg Chest 2 View  Result Date: 05/16/2018 CLINICAL DATA:  Right-sided rib pain which shortness-of-breath. Sickle cell crisis. EXAM: CHEST - 2 VIEW COMPARISON:  04/30/2018 FINDINGS: Lungs are adequately inflated with mild opacification over the right base and medial left retrocardiac region which may be due to atelectasis versus early infection. No effusion. Cardiomediastinal silhouette and remainder of the exam is unchanged. IMPRESSION: Mild bibasilar opacification which may be due to atelectasis or early infection. Electronically Signed   By: Elberta Fortisaniel  Boyle M.D.   On: 05/16/2018 12:31   Ct Angio Chest Pe W And/or Wo Contrast  Result Date: 05/16/2018 CLINICAL DATA:  17 year old male with history of chest pain and shortness of breath for the past 2 days. EXAM: CT ANGIOGRAPHY CHEST WITH CONTRAST TECHNIQUE: Multidetector CT imaging of the chest was performed using the standard protocol during bolus administration of intravenous contrast. Multiplanar CT image reconstructions and MIPs were obtained to evaluate the vascular anatomy. CONTRAST:  50mL ISOVUE-370 IOPAMIDOL (ISOVUE-370) INJECTION 76% COMPARISON:  No priors. FINDINGS: Cardiovascular: No filling defects within the pulmonary arterial tree to suggest  underlying pulmonary embolism. Heart size is mildly enlarged. There is no significant pericardial fluid, thickening or pericardial calcification. No atherosclerotic calcifications are noted in the thoracic aorta or the coronary arteries. Mediastinum/Nodes: No pathologically enlarged mediastinal or hilar lymph nodes. Esophagus is unremarkable in appearance. No axillary lymphadenopathy. Lungs/Pleura: Patchy airspace consolidation in the right lower lobe, concerning for bronchopneumonia or sequela of aspiration. Mild scarring in the left lower lobe dependently. Trace right pleural effusion lying dependently. 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 Musculoskeletal: H-shaped vertebral bodies, compatible with reported clinical history of sickle cell disease. There are no aggressive appearing lytic or blastic lesions noted in the visualized portions of the skeleton. Review of the MIP images confirms the above findings. IMPRESSION: 1. No evidence of pulmonary embolism. 2. Airspace consolidation in the right lower lobe which likely represents a manifestation of acute chest syndrome in this patient with history of sickle cell disease. Typically, these findings would be seen in the setting of pneumonia or recent aspiration. 3. Trace right pleural effusion lying dependently. 4. Scarring in the left lower lobe, presumably from prior infection/infarction. 5. Mild cardiomegaly. Electronically Signed   By: Trudie Reed M.D.   On: 05/16/2018 14:11    Medications: Scheduled Meds: . enoxaparin (LOVENOX) injection  40 mg Subcutaneous Q24H  . hydroxyurea  1,000 mg Oral Daily   And  . hydroxyurea  600 mg Oral Daily  . lisinopril  10 mg Oral Daily  . morphine   Intravenous Q4H  . senna  1 tablet Oral BID  . sodium chloride flush  3 mL Intravenous Q12H   Continuous Infusions: . azithromycin    . cefTRIAXone (ROCEPHIN)  IV Stopped (05/16/18 2213)  . dextrose 5 % and 0.45% NaCl 125  mL/hr at 05/17/18 0354  . diphenhydrAMINE     PRN Meds:.diphenhydrAMINE **OR** diphenhydrAMINE, ketorolac, naloxone **AND** sodium chloride flush, ondansetron (ZOFRAN) IV, promethazine  Assessment/Plan: Active Problems:   Sickle cell pain crisis (HCC)   Acute chest syndrome (HCC)  1. Acute Chest Syndrome: Improving symptomatically, no fever, chest pain is improved, reduce IVF to 50 cc/hr, continue IV Ceftriaxone and IV Azithromycin. Supportive therapy with Oxygen and incentive spirometry. Restart home pain medication. 2. Hb Sickle Cell Disease with crisis: Improving, pain now below 5/10. Reduce IVF D5 .45% to 50 mls/hour, begin to wean weight based Morphine PCA started, continue IV Toradol 30 mg Q 6 H, Monitor vitals very closely, Re-evaluate pain scale regularly, 2 L of Oxygen by [place], Patient will be re-evaluated for pain in the context of function and relationship to baseline as care progresses. 3. Leukocytosis: WBC is trending down, no fever, Patient on antibiotics for ACS. Will continue to monitor. 4. Sickle Cell Anemia: Hb appears stable at this time. No indication for blood transfusion or exchange transfusion. Will monitor closely. 5. Sickle Cell Nephropathy: Continue Lisinopril   Code Status: Full Code Family Communication: N/A Disposition Plan: Not yet ready for discharge  Aurel Nguyen  If 7PM-7AM, please contact  night-coverage.  05/17/2018, 11:56 AM  LOS: 1 day

## 2018-05-18 DIAGNOSIS — R079 Chest pain, unspecified: Secondary | ICD-10-CM

## 2018-05-18 LAB — CBC WITH DIFFERENTIAL/PLATELET
Basophils Absolute: 0.1 10*3/uL (ref 0.0–0.1)
Basophils Relative: 1 %
Eosinophils Absolute: 0.2 10*3/uL (ref 0.0–0.7)
Eosinophils Relative: 2 %
HCT: 27.9 % — ABNORMAL LOW (ref 39.0–52.0)
Hemoglobin: 9.7 g/dL — ABNORMAL LOW (ref 13.0–17.0)
Lymphocytes Relative: 43 %
Lymphs Abs: 4.2 10*3/uL — ABNORMAL HIGH (ref 0.7–4.0)
MCH: 31.6 pg (ref 26.0–34.0)
MCHC: 34.8 g/dL (ref 30.0–36.0)
MCV: 90.9 fL (ref 78.0–100.0)
Monocytes Absolute: 1.3 10*3/uL — ABNORMAL HIGH (ref 0.1–1.0)
Monocytes Relative: 13 %
Neutro Abs: 3.9 10*3/uL (ref 1.7–7.7)
Neutrophils Relative %: 41 %
Platelets: 472 10*3/uL — ABNORMAL HIGH (ref 150–400)
RBC: 3.07 MIL/uL — ABNORMAL LOW (ref 4.22–5.81)
RDW: 16.6 % — ABNORMAL HIGH (ref 11.5–15.5)
WBC: 9.7 10*3/uL (ref 4.0–10.5)

## 2018-05-18 MED ORDER — OXYCODONE HCL 5 MG PO TABS: 5.0000 mg | ORAL_TABLET | Freq: Four times a day (QID) | ORAL | 0 refills | Status: AC

## 2018-05-18 MED ORDER — AMOXICILLIN-POT CLAVULANATE 875-125 MG PO TABS: 1.0000 | ORAL_TABLET | Freq: Two times a day (BID) | ORAL | 0 refills | Status: AC

## 2018-05-18 NOTE — Discharge Summary (Signed)
Physician Discharge Summary  DEAUNDRE ALLSTON ZOX:[id number] DOB: [birth year] DOA: 05/16/2018  PCP: Maree Erie, MD  Admit date: 05/16/2018  Discharge date: 05/18/2018  Discharge Diagnoses:  Active Problems:   Sickle cell pain crisis (HCC)   Acute chest syndrome [facility])   Discharge Condition: Stable  Disposition:  Follow-up Information    Maree Erie, MD. Call.   Specialty:  Pediatrics Contact information: [address] [place] [address] [phone]          Pt is discharged home in good condition and is to follow up with Maree Erie, MD this week to have labs evaluated. [name] is instructed to increase activity slowly and balance with rest for the next few days, and use prescribed medication to complete treatment of pain  Diet: Regular Wt Readings from Last 3 Encounters:  05/16/18 83.5 kg (184 lb 1.4 oz) (87 %, Z= 1.11)*  05/01/18 83.5 kg (184 lb) (87 %, Z= 1.11)*  04/30/18 83.5 kg (184 lb) (87 %, Z= 1.11)*   * Growth percentiles are based on CDC (Boys, 2-20 Years) data.    History of present illness:  [name] Hill  is a 17 y.o. malewith medical history significant for sickle cell nephropathy, asthma, and sickle cell anemia, who presented to the ED today with major complain of right sided chest pain which is worse with deep breathing and feels tight when he stretches. He feels some shortness of breath initially but now a little improved and breathing well even without oxygen. He denies pain in any other part of his body. He denies any fever. No cough or wheezing. No history of smoking. He denies any urinary symptom, no nausea or vomiting, no diarrhea or constipation.  ED Course:In the ED, he was found to be afebrile, saturating well on room air, CXR showed mild bibasilar opacification which may be due to atelectasis or early infection, CT Angio showed "No evidence of pulmonary embolism. Airspace consolidation in the right lower  lobe which likely represents a manifestation of acute chest syndrome in this patient with history of sickle cell disease. Typically, these findings would be seen in the setting of pneumonia or recent aspiration. Trace right pleural effusion lying dependently. Scarring in the left lower lobe, presumably from prior infection/infarction. And Mild cardiomegaly". Chemistry panel and CBC were unremarkable. Temperature was normal as are other vitals signs. He was hydrated and started on IV Cefepime and Vancomycin with a presumptive diagnosis of Pneumonia Vs ACS.Patient was stable hemodynamically and in no acute distress.   Patient will be admitted for Acute Chest Syndrome and Sickle Cell Pain  Crisis. Transfer to [facility] from [facility] Course:  Patient was admitted for Acute Chest Syndrome and sickle cell pain crisis and managed appropriately with IVF, IV Morphine via PCA and IV Toradol, as well as other adjunct therapies per sickle cell pain management protocols. Patient also received IV Ceftriaxone and Azithromycin for ACS per EBM recommendations. He did well very quickly and within 24 hours, pain had returned to baseline of <2/10. Patient actually requested to be discharged yesterday but advised to stay on till today for better management. He remained hemodynamically stable throughout admission and was discharged home today in the same stable condition. He was given prescription for 7 days Augmentin to complete antibiotic course and also #20 tablets of Percocet for pain control until he is able to follow up with PCP/Hematologist.   Discharge Exam: Vitals:   05/18/18  0413 05/18/18 0539  BP:  114/64  Pulse:  68  Resp: 17   Temp:  98.1 F (36.7 C)  SpO2: 97% 97%   Vitals:   05/17/18 2144 05/18/18 0007 05/18/18 0413 05/18/18 0539  BP: 121/63   114/64  Pulse: 87   68  Resp: (!) 21 (!) 22 17   Temp: 98.5 F (36.9 C)   98.1 F (36.7 C)  TempSrc: Oral   Oral  SpO2: 97% 97% 97% 97%   Weight:      Height:       General appearance : Awake, alert, not in any distress. Speech Clear. Not toxic looking HEENT: Atraumatic and Normocephalic, pupils equally reactive to light and accomodation Neck: Supple, no JVD. No cervical lymphadenopathy.  Chest: Good air entry bilaterally, no added sounds  CVS: S1 S2 regular, no murmurs.  Abdomen: Bowel sounds present, Non tender and not distended with no gaurding, rigidity or rebound. Extremities: B/L Lower Ext shows no edema, both legs are warm to touch Neurology: Awake alert, and oriented X 3, CN II-XII intact, Non focal Skin: No Rash  Discharge Instructions  Discharge Instructions    Diet - low sodium heart healthy   Complete by:  As directed    Increase activity slowly   Complete by:  As directed      Allergies as of 05/18/2018      Reactions   Other Cough   Seasonal allergies.      Medication List    TAKE these medications   acetaminophen 650 MG CR tablet Commonly known as:  TYLENOL Take 1,300 mg by mouth every 8 (eight) hours as needed for pain.   albuterol 108 (90 Base) MCG/ACT inhaler Commonly known as:  PROVENTIL HFA;VENTOLIN HFA Inhale 2 puffs into the lungs every 4 (four) hours as needed for wheezing or shortness of breath.   amoxicillin-clavulanate 875-125 MG tablet Commonly known as:  AUGMENTIN Take 1 tablet by mouth 2 (two) times daily for 7 days.   hydroxyurea 400 MG capsule Commonly known as:  DROXIA Take 4 capsules (1,600 mg total) by mouth daily.   lisinopril 10 MG tablet Commonly known as:  PRINIVIL,ZESTRIL Take 1 tablet (10 mg total) by mouth daily.   loperamide 2 MG capsule Commonly known as:  IMODIUM Take 1 capsule (2 mg total) 4 (four) times daily as needed by mouth for diarrhea or loose stools.   oxyCODONE 5 MG immediate release tablet Commonly known as:  Oxy IR/ROXICODONE Take 1 tablet (5 mg total) by mouth every 6 (six) hours for 5 days. As needed for pain What changed:  Another  medication with the same name was removed. Continue taking this medication, and follow the directions you see here.       The results of significant diagnostics from this hospitalization (including imaging, microbiology, ancillary and laboratory) are listed below for reference.    Significant Diagnostic Studies: Dg Chest 2 View  Result Date: 05/16/2018 CLINICAL DATA:  Right-sided rib pain which shortness-of-breath. Sickle cell crisis. EXAM: CHEST - 2 VIEW COMPARISON:  04/30/2018 FINDINGS: Lungs are adequately inflated with mild opacification over the right base and medial left retrocardiac region which may be due to atelectasis versus early infection. No effusion. Cardiomediastinal silhouette and remainder of the exam is unchanged. IMPRESSION: Mild bibasilar opacification which may be due to atelectasis or early infection. Electronically Signed   By: Elberta Fortis M.D.   On: 05/16/2018 12:31   Dg Chest 2 View  Result Date: 04/30/2018  CLINICAL DATA:  Shortness of breath and chest pain EXAM: CHEST - 2 VIEW COMPARISON:  03/14/2018 FINDINGS: Cardiac shadow is stable. The lungs are well aerated bilaterally. No focal infiltrate or sizable effusion is seen. No acute bony abnormality is noted. IMPRESSION: No acute abnormality noted. Electronically Signed   By: Alcide CleverMark  Lukens M.D.   On: 04/30/2018 15:38   Ct Angio Chest Pe W And/or Wo Contrast  Result Date: 05/16/2018 CLINICAL DATA:  17 year old male with history of chest pain and shortness of breath for the past 2 days. EXAM: CT ANGIOGRAPHY CHEST WITH CONTRAST TECHNIQUE: Multidetector CT imaging of the chest was performed using the standard protocol during bolus administration of intravenous contrast. Multiplanar CT image reconstructions and MIPs were obtained to evaluate the vascular anatomy. CONTRAST:  50mL ISOVUE-370 IOPAMIDOL (ISOVUE-370) INJECTION 76% COMPARISON:  No priors. FINDINGS: Cardiovascular: No filling defects within the pulmonary arterial tree  to suggest underlying pulmonary embolism. Heart size is mildly enlarged. There is no significant pericardial fluid, thickening or pericardial calcification. No atherosclerotic calcifications are noted in the thoracic aorta or the coronary arteries. Mediastinum/Nodes: No pathologically enlarged mediastinal or hilar lymph nodes. Esophagus is unremarkable in appearance. No axillary lymphadenopathy. Lungs/Pleura: Patchy airspace consolidation in the right lower lobe, concerning for bronchopneumonia or sequela of aspiration. Mild scarring in the left lower lobe dependently. Trace right pleural effusion lying dependently. 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 Musculoskeletal: H-shaped vertebral bodies, compatible with reported clinical history of sickle cell disease. There are no aggressive appearing lytic or blastic lesions noted in the visualized portions of the skeleton. Review of the MIP images confirms the above findings. IMPRESSION: 1. No evidence of pulmonary embolism. 2. Airspace consolidation in the right lower lobe which likely represents a manifestation of acute chest syndrome in this patient with history of sickle cell disease. Typically, these findings would be seen in the setting of pneumonia or recent aspiration. 3. Trace right pleural effusion lying dependently. 4. Scarring in the left lower lobe, presumably from prior infection/infarction. 5. Mild cardiomegaly. Electronically Signed   By: Trudie Reedaniel  Entrikin M.D.   On: 05/16/2018 14:11    Microbiology: Recent Results (from the past 240 hour(s))  Blood Culture (routine x 2)     Status: None (Preliminary result)   Collection Time: 05/16/18  2:40 PM  Result Value Ref Range Status   Specimen Description BLOOD LEFT FOREARM  Final   Special Requests   Final    BOTTLES DRAWN AEROBIC AND ANAEROBIC Blood Culture adequate volume   Culture   Final    NO GROWTH < 24 HOURS Performed at [facility], [address] [place], [place] [address]    Report Status PENDING  Incomplete  Blood Culture (routine x 2)     Status: None (Preliminary result)   Collection Time: 05/16/18  2:42 PM  Result Value Ref Range Status   Specimen Description BLOOD LEFT HAND  Final   Special Requests   Final    BOTTLES DRAWN AEROBIC AND ANAEROBIC Blood Culture adequate volume   Culture   Final    NO GROWTH < 24 HOURS Performed at [facility], [address] [place], [place] [address]    Report Status PENDING  Incomplete     Labs: Basic Metabolic Panel: Recent Labs  Lab 05/16/18 1147 05/17/18 0447  NA 136 138  K 3.7 3.9  CL 100* 104  CO2 25 27  GLUCOSE 151* 132*  BUN 7 7  CREATININE 0.70 0.57*  CALCIUM 9.3 8.7*   Liver Function Tests: Recent Labs  Lab 05/16/18 1147 05/17/18 0447  AST 27 19  ALT 15* 12*  ALKPHOS 77 69  BILITOT 2.8* 1.9*  PROT 7.3 6.8  ALBUMIN 4.0 3.7   No results for input(s): LIPASE, AMYLASE in the last 168 hours. No results for input(s): AMMONIA in the last 168 hours. CBC: Recent Labs  Lab 05/16/18 1147 05/17/18 0447 05/18/18 0459  WBC 12.5* 11.5* 9.7  NEUTROABS 8.3*  --  3.9  HGB 10.2* 9.4* 9.7*  HCT 29.6* 26.8* 27.9*  MCV 91.1 91.5 90.9  PLT 452* 434* 472*   Cardiac Enzymes: No results for input(s): CKTOTAL, CKMB, CKMBINDEX, TROPONINI in the last 168 hours. BNP: Invalid input(s): POCBNP CBG: No results for input(s): GLUCAP in the last 168 hours.  Time coordinating discharge: 50 minutes  Signed:  Clemence Lengyel  [facility] 05/18/2018, 8:55 AM

## 2018-05-18 NOTE — Progress Notes (Signed)
Wasted 12 ml of Morphine from PCA witnessed by  Driscilla MoatsBriana Holt, RN

## 2018-05-18 NOTE — Discharge Instructions (Signed)
Sickle Cell Anemia, Adult °Sickle cell anemia is a condition in which red blood cells have an abnormal “sickle” shape. This abnormal shape shortens the cells’ life span, which results in a lower than normal concentration of red blood cells in the blood. The sickle shape also causes the cells to clump together and block free blood flow through the blood vessels. As a result, the tissues and organs of the body do not receive enough oxygen. Sickle cell anemia causes organ damage and pain and increases the risk of infection. °What are the causes? °Sickle cell anemia is a genetic disorder. Those who receive two copies of the gene have the condition, and those who receive one copy have the trait. °What increases the risk? °The sickle cell gene is most common in people whose families originated in [place]. Other areas of the globe where sickle cell trait occurs include the Mediterranean, [place] and [place], the [place], and the [place]. °What are the signs or symptoms? °· Pain, especially in the extremities, back, chest, or abdomen (common). The pain may start suddenly or may develop following an illness, especially if there is dehydration. Pain can also occur due to overexertion or exposure to extreme temperature changes. °· Frequent severe bacterial infections, especially certain types of pneumonia and meningitis. °· Pain and swelling in the hands and feet. °· Decreased activity. °· Loss of appetite. °· Change in behavior. °· Headaches. °· Seizures. °· Shortness of breath or difficulty breathing. °· Vision changes. °· Skin ulcers. °Those with the trait may not have symptoms or they may have mild symptoms. °How is this diagnosed? °Sickle cell anemia is diagnosed with blood tests that demonstrate the genetic trait. It is often diagnosed during the newborn period, due to mandatory testing nationwide. A variety of blood tests, X-rays, CT scans, MRI scans, ultrasounds, and lung function tests may also be done to  monitor the condition. °How is this treated? °Sickle cell anemia may be treated with: °· Medicines. You may be given pain medicines, antibiotic medicines (to treat and prevent infections) or medicines to increase the production of certain types of hemoglobin. °· Fluids. °· Oxygen. °· Blood transfusions. ° °Follow these instructions at home: °· Drink enough fluid to keep your urine clear or pale yellow. Increase your fluid intake in hot weather and during exercise. °· Do not smoke. Smoking lowers oxygen levels in the blood. °· Only take over-the-counter or prescription medicines for pain, fever, or discomfort as directed by your health care provider. °· Take antibiotics as directed by your health care provider. Make sure you finish them it even if you start to feel better. °· Take supplements as directed by your health care provider. °· Consider wearing a medical alert bracelet. This tells anyone caring for you in an emergency of your condition. °· When traveling, keep your medical information, health care provider's names, and the medicines you take with you at all times. °· If you develop a fever, do not take medicines to reduce the fever right away. This could cover up a problem that is developing. Notify your health care provider. °· Keep all follow-up appointments with your health care provider. Sickle cell anemia requires regular medical care. °Contact a health care provider if: °You have a fever. °Get help right away if: °· You feel dizzy or faint. °· You have new abdominal pain, especially on the left side near the stomach area. °· You develop a persistent, often uncomfortable and painful penile erection (priapism). If this is not   treated immediately it will lead to impotence. °· You have numbness your arms or legs or you have a hard time moving them. °· You have a hard time with speech. °· You have a fever or persistent symptoms for more than 2-3 days. °· You have a fever and your symptoms suddenly get  worse. °· You have signs or symptoms of infection. These include: °? Chills. °? Abnormal tiredness (lethargy). °? Irritability. °? Poor eating. °? Vomiting. °· You develop pain that is not helped with medicine. °· You develop shortness of breath. °· You have pain in your chest. °· You are coughing up pus-like or bloody sputum. °· You develop a stiff neck. °· Your feet or hands swell or have pain. °· Your abdomen appears bloated. °· You develop joint pain. °This information is not intended to replace advice given to you by your health care provider. Make sure you discuss any questions you have with your health care provider. °Document Released: 02/19/2006 Document Revised: 05/31/2016 Document Reviewed: 06/23/2013 °Elsevier Interactive Patient Education © 2017 [employer] Inc. ° °

## 2018-05-21 LAB — CULTURE, BLOOD (ROUTINE X 2)
Culture: NO GROWTH
Culture: NO GROWTH
Special Requests: ADEQUATE
Special Requests: ADEQUATE

## 2018-05-25 ENCOUNTER — Ambulatory Visit: Payer: Medicaid Other | Admitting: Family Medicine

## 2018-05-26 ENCOUNTER — Emergency Department (HOSPITAL_COMMUNITY)
Admission: EM | Admit: 2018-05-26 | Discharge: 2018-05-27 | Disposition: A | Discharge status: Home / Self Care | Payer: Medicaid Other | Attending: Emergency Medicine | Admitting: Emergency Medicine

## 2018-05-26 ENCOUNTER — Encounter (HOSPITAL_COMMUNITY): Payer: Self-pay | Admitting: Emergency Medicine

## 2018-05-26 DIAGNOSIS — D5700 Hb-SS disease with crisis, unspecified: Secondary | ICD-10-CM

## 2018-05-26 DIAGNOSIS — J45909 Unspecified asthma, uncomplicated: Secondary | ICD-10-CM | POA: Diagnosis not present

## 2018-05-26 DIAGNOSIS — Z79899 Other long term (current) drug therapy: Secondary | ICD-10-CM | POA: Diagnosis not present

## 2018-05-26 DIAGNOSIS — R079 Chest pain, unspecified: Secondary | ICD-10-CM | POA: Diagnosis present

## 2018-05-26 LAB — CBC WITH DIFFERENTIAL/PLATELET
Abs Immature Granulocytes: 0.1 10*3/uL (ref 0.0–0.1)
Basophils Absolute: 0.1 10*3/uL (ref 0.0–0.1)
Basophils Relative: 1 %
Eosinophils Absolute: 0.1 10*3/uL (ref 0.0–0.7)
Eosinophils Relative: 0 %
HCT: 27 % — ABNORMAL LOW (ref 39.0–52.0)
Hemoglobin: 9.4 g/dL — ABNORMAL LOW (ref 13.0–17.0)
Immature Granulocytes: 0 %
Lymphocytes Relative: 21 %
Lymphs Abs: 2.8 10*3/uL (ref 0.7–4.0)
MCH: 30.8 pg (ref 26.0–34.0)
MCHC: 34.8 g/dL (ref 30.0–36.0)
MCV: 88.5 fL (ref 78.0–100.0)
Monocytes Absolute: 1 10*3/uL (ref 0.1–1.0)
Monocytes Relative: 7 %
Neutro Abs: 9.1 10*3/uL — ABNORMAL HIGH (ref 1.7–7.7)
Neutrophils Relative %: 71 %
Platelets: 480 10*3/uL — ABNORMAL HIGH (ref 150–400)
RBC: 3.05 MIL/uL — ABNORMAL LOW (ref 4.22–5.81)
RDW: 16.5 % — ABNORMAL HIGH (ref 11.5–15.5)
WBC: 13 10*3/uL — ABNORMAL HIGH (ref 4.0–10.5)

## 2018-05-26 LAB — COMPREHENSIVE METABOLIC PANEL
ALT: 19 U/L (ref 0–44)
AST: 36 U/L (ref 15–41)
Albumin: 4.3 g/dL (ref 3.5–5.0)
Alkaline Phosphatase: 82 U/L (ref 38–126)
Anion gap: 12 (ref 5–15)
BUN: <5 mg/dL — ABNORMAL LOW (ref 6–20)
CO2: 24 mmol/L (ref 22–32)
Calcium: 9.3 mg/dL (ref 8.9–10.3)
Chloride: 99 mmol/L (ref 98–111)
Creatinine, Ser: 0.64 mg/dL (ref 0.61–1.24)
GFR calc Af Amer: >60 mL/min (ref >60)
GFR calc non Af Amer: >60 mL/min (ref >60)
Glucose, Bld: 102 mg/dL — ABNORMAL HIGH (ref 70–99)
Potassium: 3.5 mmol/L (ref 3.5–5.1)
Sodium: 135 mmol/L (ref 135–145)
Total Bilirubin: 2.6 mg/dL — ABNORMAL HIGH (ref 0.3–1.2)
Total Protein: 7.4 g/dL (ref 6.5–8.1)

## 2018-05-26 LAB — RETICULOCYTES
RBC.: 3.05 MIL/uL — ABNORMAL LOW (ref 4.22–5.81)
Retic Count, Absolute: 183 10*3/uL (ref 19.0–186.0)
Retic Ct Pct: 6 % — ABNORMAL HIGH (ref 0.4–3.1)

## 2018-05-26 MED ORDER — HYDROMORPHONE HCL 1 MG/ML IJ SOLN
2.0000 mg | INTRAMUSCULAR | Status: DC | PRN
  Administered 2018-05-26: 2 mg via INTRAVENOUS
  Filled 2018-05-26: qty 2

## 2018-05-26 MED ORDER — KETOROLAC TROMETHAMINE 30 MG/ML IJ SOLN
30.0000 mg | Freq: Once | INTRAMUSCULAR | Status: AC
  Administered 2018-05-26: 30 mg via INTRAVENOUS
  Filled 2018-05-26: qty 1

## 2018-05-26 MED ORDER — SODIUM CHLORIDE 0.9 % IV BOLUS
1000.0000 mL | Freq: Once | INTRAVENOUS | Status: AC
  Administered 2018-05-26: 1000 mL via INTRAVENOUS

## 2018-05-26 NOTE — ED Provider Notes (Signed)
[facility] Provider Note   CSN: [id number] Arrival date & time: 05/26/18  2120     History   Chief Complaint Chief Complaint  Patient presents with  . Sickle Cell Pain Crisis    HPI [name] Hill is a 17 y.o. male.  HPI  This is an 17 year old male with history of sickle cell anemia comp located by multiple episodes of acute chest syndrome who presents with sickle cell pain.  Patient reports 1 day history of right arm pain.  Pain is limited to the right arm.  Pain is classic for sickle cell pain.  Rates his pain at 10 out of 10.  He takes oxycodone at home with no relief.  Patient was last admitted 1 week ago.  Patient denies chest pain, shortness of breath, cough, fevers.  He denies any other symptoms.  Past Medical History:  Diagnosis Date  . 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 Blurred vision   . Sickle cell anemia (HCC)   . Sickle cell crisis (HCC)   . Sickle cell nephropathy [facility](HCC)     Patient Active Problem List   Diagnosis Date Noted  . Acute chest pain   . Acute chest syndrome (HCC) 05/16/2018  . Leukocytosis   . Transaminitis   . 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 Family circumstance 07/03/2016  . 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 Asthma 06/03/2012  . Abnormal presence of protein in urine 06/03/2012  . Sickle cell pain crisis (HCC) 04/21/2012    Past Surgical History:  Procedure Laterality Date  . CIRCUMCISION    . TONSILLECTOMY    . TONSILLECTOMY AND ADENOIDECTOMY          Home Medications    Prior to Admission medications   Medication Sig Start Date End Date Taking? Authorizing Provider  acetaminophen (TYLENOL) 650 MG CR tablet Take 1,300 mg by mouth every 8 (eight) hours as needed for pain.   Yes [provider]  albuterol (PROVENTIL HFA;VENTOLIN HFA) 108 (90 Base) MCG/ACT inhaler Inhale 2  puffs into the lungs every 4 (four) hours as needed for wheezing or shortness of breath. 10/05/16  Yes Lelan PonsNewman, Caroline, MD  hydroxyurea (DROXIA) 400 MG capsule Take 4 capsules (1,600 mg total) by mouth daily. 10/05/16  Yes Lelan PonsNewman, Caroline, MD  lisinopril (PRINIVIL,ZESTRIL) 10 MG tablet Take 1 tablet (10 mg total) by mouth daily. 10/05/16  Yes Lelan PonsNewman, Caroline, MD  oxyCODONE (OXY IR/ROXICODONE) 5 MG immediate release tablet Take 5 mg by mouth every 6 (six) hours as needed for severe pain.   Yes [provider]  loperamide (IMODIUM) 2 MG capsule Take 1 capsule (2 mg total) 4 (four) times daily as needed by mouth for diarrhea or loose stools. Patient not taking: Reported on 05/16/2018 10/05/17   Audry PiliMohr, Tyler, PA-C    Family History Family History  Problem Relation Age of Onset  . Sickle cell anemia Brother   . Hypertension Maternal Grandmother   . Anemia Mother     Social History Social History   Tobacco Use  . Smoking status: Never Smoker  . Smokeless tobacco: Never Used  Substance Use Topics  . Alcohol use: No  . Drug use: No     Allergies   Other   Review of Systems Review of Systems  Constitutional: Negative for fever.  Respiratory: Negative for cough and shortness of breath.  Cardiovascular: Negative for chest pain.  Gastrointestinal: Negative for abdominal pain and nausea.  Musculoskeletal:       Right arm pain  All other systems reviewed and are negative.    Physical Exam Updated Vital Signs BP 119/65   Pulse 69   Temp 98.5 F (36.9 C) (Oral)   Resp 18   SpO2 92%   Physical Exam  Constitutional: He is oriented to person, place, and time.  Nontoxic-appearing  HENT:  Head: Normocephalic and atraumatic.  Eyes: Pupils are equal, round, and reactive to light.  Cardiovascular: Normal rate, regular rhythm and normal heart sounds.  No murmur heard. Pulmonary/Chest: Effort normal and breath sounds normal. No respiratory distress. He has no wheezes.    Abdominal: Soft. Bowel sounds are normal. There is no tenderness. There is no rebound.  Musculoskeletal: He exhibits no edema.  Normal range of motion of the right shoulder and elbow, no overlying skin changes, 2+ radial pulse  Neurological: He is alert and oriented to person, place, and time.  Skin: Skin is warm and dry.  Psychiatric: He has a normal mood and affect.  Nursing note and vitals reviewed.    ED Treatments / Results  Labs (all labs ordered are listed, but only abnormal results are displayed) Labs Reviewed  COMPREHENSIVE METABOLIC PANEL - Abnormal; Notable for the following components:      Result Value   Glucose, Bld 102 (*)    BUN <5 (*)    Total Bilirubin 2.6 (*)    All other components within normal limits  CBC WITH DIFFERENTIAL/PLATELET - Abnormal; Notable for the following components:   WBC 13.0 (*)    RBC 3.05 (*)    Hemoglobin 9.4 (*)    HCT 27.0 (*)    RDW 16.5 (*)    Platelets 480 (*)    Neutro Abs 9.1 (*)    All other components within normal limits  RETICULOCYTES - Abnormal; Notable for the following components:   Retic Ct Pct 6.0 (*)    RBC. 3.05 (*)    All other components within normal limits    EKG None  Radiology No results found.  Procedures Procedures (including critical care time)  Medications Ordered in ED Medications  HYDROmorphone (DILAUDID) injection 2 mg (2 mg Intravenous Given 05/26/18 2339)  oxyCODONE-acetaminophen (PERCOCET/ROXICET) 5-325 MG per tablet 1 tablet (has no administration in time range)  ketorolac (TORADOL) 30 MG/ML injection 30 mg (30 mg Intravenous Given 05/26/18 2341)  sodium chloride 0.9 % bolus 1,000 mL (1,000 mLs Intravenous New Bag/Given 05/26/18 2341)     Initial Impression / Assessment and Plan / ED Course  I have reviewed the triage vital signs and the nursing notes.  Pertinent labs & imaging results that were available during my care of the patient were reviewed by me and considered in my medical  decision making (see chart for details).  Clinical Course as of May 27 138  Wed May 27, 2018  0138 On recheck, patient's pain has improved.  He feels safe for discharge home.   [CH]    Clinical Course User Index [CH] Horton, Mayer Masker, MD    Patient presents with pain classic for his sickle cell disease.  He is overall nontoxic-appearing.  Afebrile.  Denies any chest pain or concerns for acute chest.  His lab work-up is at his baseline.  Patient was given Toradol, Dilaudid, fluids.  On recheck after 3 doses of medication, patient states he feels much better.  He was given  1 additional dose of oral pain medication and will be discharged home with PCP for follow-up.  After history, exam, and medical workup I feel the patient has been appropriately medically screened and is safe for discharge home. Pertinent diagnoses were discussed with the patient. Patient was given return precautions.   Final Clinical Impressions(s) / ED Diagnoses   Final diagnoses:  Sickle cell pain crisis [facility])    ED Discharge Orders    None       Wilkie Aye, Mayer Masker, MD 05/27/18 [id number]

## 2018-05-26 NOTE — ED Triage Notes (Signed)
Pt endorses sickle cell crisis onset this AM. Pt reports R arm pain. Took oxycodone PTA.

## 2018-05-26 NOTE — ED Notes (Signed)
Pt denies drug use but smells of marijuana

## 2018-05-27 MED ORDER — OXYCODONE-ACETAMINOPHEN 5-325 MG PO TABS
1.0000 | ORAL_TABLET | Freq: Once | ORAL | Status: AC
  Administered 2018-05-27: 1 via ORAL
  Filled 2018-05-27: qty 1

## 2018-06-25 DIAGNOSIS — D571 Sickle-cell disease without crisis: Secondary | ICD-10-CM

## 2018-06-25 HISTORY — DX: Sickle-cell disease without crisis: D57.1

## 2018-06-26 ENCOUNTER — Ambulatory Visit (INDEPENDENT_AMBULATORY_CARE_PROVIDER_SITE_OTHER): Payer: Medicaid Other | Admitting: Family Medicine

## 2018-06-26 VITALS — BP 110/72 | HR 80 | Temp 98.0°F | Ht 66.02 in | Wt 187.0 lb

## 2018-06-26 DIAGNOSIS — D571 Sickle-cell disease without crisis: Secondary | ICD-10-CM

## 2018-06-26 DIAGNOSIS — N390 Urinary tract infection, site not specified: Secondary | ICD-10-CM

## 2018-06-26 DIAGNOSIS — Z09 Encounter for follow-up examination after completed treatment for conditions other than malignant neoplasm: Secondary | ICD-10-CM | POA: Diagnosis not present

## 2018-06-26 DIAGNOSIS — R8290 Unspecified abnormal findings in urine: Secondary | ICD-10-CM

## 2018-06-26 DIAGNOSIS — G894 Chronic pain syndrome: Secondary | ICD-10-CM | POA: Diagnosis not present

## 2018-06-26 DIAGNOSIS — Z7689 Persons encountering health services in other specified circumstances: Secondary | ICD-10-CM | POA: Diagnosis not present

## 2018-06-26 DIAGNOSIS — R319 Hematuria, unspecified: Secondary | ICD-10-CM

## 2018-06-26 LAB — POCT URINALYSIS DIP (MANUAL ENTRY)
Bilirubin, UA: NEGATIVE
Glucose, UA: NEGATIVE mg/dL
Ketones, POC UA: NEGATIVE mg/dL
Nitrite, UA: NEGATIVE
Protein Ur, POC: >=300 mg/dL — AB
Spec Grav, UA: 1.02 (ref 1.010–1.025)
Urobilinogen, UA: 2 E.U./dL — AB
pH, UA: 7 (ref 5.0–8.0)

## 2018-06-26 MED ORDER — SULFAMETHOXAZOLE-TRIMETHOPRIM 800-160 MG PO TABS
1.0000 | ORAL_TABLET | Freq: Two times a day (BID) | ORAL | 0 refills | Status: DC
Start: 2018-06-26 — End: 2018-08-30

## 2018-06-26 MED ORDER — OXYCODONE HCL 5 MG PO TABS: 5.0000 mg | ORAL_TABLET | Freq: Four times a day (QID) | ORAL | 0 refills | Status: DC | PRN

## 2018-06-26 NOTE — Progress Notes (Signed)
New Patient-Establish Care   Subjective:    Patient ID: [name] Hill, male    DOB: 04-Jan-2001, 17 y.o.   MRN: [id number]   Chief Complaint  Patient presents with  . Establish Care    sickle cell   HPI  [name] Hill has a past medical of Sickle Cell Anemia, History of Priapism. He is here today to establish care.   Current Status: He states that he is doing well with no complaints. He does have mild pain in his back and chest if he experiences sickle cell crisis. He was a previous patient at [facility] and [facility].   He denies fevers, chills, fatigue, recent infections, weight loss, and night sweats. He has not had any headaches, visual changes, dizziness, and falls. No chest pain, heart palpitations, cough and shortness of breath reported. No reports of GI problems such as nausea, vomiting, diarrhea, and constipation. He has no reports of blood in stools, dysuria and hematuria. No depression or anxiety, and denies suicidal ideations, homicidal ideations, or auditory hallucinations.   Past Medical History:  Diagnosis Date  . 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 Blurred vision   . Sickle cell anemia (HCC)   . Sickle cell crisis ([facility])   . Sickle cell nephropathy (HCC)     Family History  Problem Relation Age of Onset  . Sickle cell anemia Brother   . Hypertension Maternal Grandmother   . Anemia Mother     Social History   Socioeconomic History  . Marital status: Single    Spouse name: Not on file  . Number of children: Not on file  . Years of education: Not on file  . Highest education level: Not on file  Occupational History  . Not on file  Social Needs  . Financial resource strain: Not on file  . Food insecurity:    Worry: Not on file    Inability: Not on file  . Transportation needs:    Medical: Not on file    Non-medical: Not on file  Tobacco Use  . Smoking status:  Never Smoker  . Smokeless tobacco: Never Used  Substance and Sexual Activity  . Alcohol use: No  . Drug use: No  . Sexual activity: Not on file  Lifestyle  . Physical activity:    Days per week: Not on file    Minutes per session: Not on file  . Stress: Not on file  Relationships  . Social connections:    Talks on phone: Not on file    Gets together: Not on file    Attends religious service: Not on file    Active member of club or organization: Not on file    Attends meetings of clubs or organizations: Not on file    Relationship status: Not on file  . Intimate partner violence:    Fear of current or ex partner: Not on file    Emotionally abused: Not on file    Physically abused: Not on file    Forced sexual activity: Not on file  Other Topics Concern  . Not on file  Social History Narrative   Lives at home with mother, 6 siblings and sometimes grandmother.    Past Surgical History:  Procedure Laterality Date  . CIRCUMCISION    . TONSILLECTOMY    . TONSILLECTOMY AND ADENOIDECTOMY      Immunization History  Administered  Date(s) Administered  . DTaP 11/09/1999, 01/10/2000, 03/20/2000, 10/11/2001, 07/27/2004  . HPV Quadrivalent 12/18/2011, 11/09/2012  . Hepatitis A 04/19/2010, 11/09/2012  . Hepatitis B Mar [birth year], 1999, 10/11/1999, 03/20/2000  . HiB (PRP-OMP) 11/09/1999, 01/10/2000, 03/20/2000, 10/11/2001  . IPV 11/09/1999, 01/10/2000, 06/10/2000, 07/27/2004  . Influenza,inj,Quad PF,6+ Mos 10/06/2013, 10/07/2014, 10/11/2015, 01/15/2017  . Influenza-Unspecified 09/09/2012  . MMR 09/15/2000, 07/27/2004  . Meningococcal B, OMV 07/15/2016, 01/15/2017  . Meningococcal Conjugate 05/16/2006, 11/29/2010, 11/09/2012, 07/15/2016  . Pneumococcal Conjugate-13 04/18/2010  . Pneumococcal Polysaccharide-23 08/08/2004, 12/14/2009, 11/04/2017  . Pneumococcal-Unspecified 11/09/1999, 01/10/2000, 03/20/2000, 07/27/2004  . Tdap 08/16/2011  . Varicella 09/15/2000, 04/19/2010    Current Meds   Medication Sig  . acetaminophen (TYLENOL) 650 MG CR tablet Take 1,300 mg by mouth every 8 (eight) hours as needed for pain.  Marland Kitchen albuterol (PROVENTIL HFA;VENTOLIN HFA) 108 (90 Base) MCG/ACT inhaler Inhale 2 puffs into the lungs every 4 (four) hours as needed for wheezing or shortness of breath.  . hydroxyurea (DROXIA) 400 MG capsule Take 4 capsules (1,600 mg total) by mouth daily.  Marland Kitchen lisinopril (PRINIVIL,ZESTRIL) 10 MG tablet Take 1 tablet (10 mg total) by mouth daily.  Marland Kitchen loperamide (IMODIUM) 2 MG capsule Take 1 capsule (2 mg total) 4 (four) times daily as needed by mouth for diarrhea or loose stools.  Marland Kitchen oxyCODONE (OXY IR/ROXICODONE) 5 MG immediate release tablet Take 1 tablet (5 mg total) by mouth every 6 (six) hours as needed for severe pain.  . [DISCONTINUED] oxyCODONE (OXY IR/ROXICODONE) 5 MG immediate release tablet Take 5 mg by mouth every 6 (six) hours as needed for severe pain.    Allergies  Allergen Reactions  . Other Cough    Seasonal allergies.    BP 110/72 (BP Location: Left Arm, Patient Position: Sitting, Cuff Size: Small)   Pulse 80   Temp 98 F (36.7 C) (Oral)   Ht 5' 6.02" (1.677 m)   Wt 187 lb (84.8 kg)   SpO2 100%   BMI 30.16 kg/m    Review of Systems  Constitutional: Negative.   HENT: Negative.   Eyes: Negative.   Respiratory: Negative.   Cardiovascular: Negative.   Gastrointestinal: Negative.   Endocrine: Negative.   Genitourinary: Negative.   Musculoskeletal: Negative.   Skin: Negative.   Allergic/Immunologic: Negative.   Neurological: Negative.   Hematological: Negative.   Psychiatric/Behavioral: Negative.    Objective:   Physical Exam  Constitutional: He is oriented to person, place, and time. He appears well-developed and well-nourished.  HENT:  Head: Normocephalic and atraumatic.  Right Ear: External ear normal.  Left Ear: External ear normal.  Nose: Nose normal.  Mouth/Throat: Oropharynx is clear and moist.  Eyes: Pupils are equal, round,  and reactive to light. Conjunctivae and EOM are normal.  Neck: Normal range of motion. Neck supple.  Cardiovascular: Normal rate, regular rhythm, normal heart sounds and intact distal pulses.  Pulmonary/Chest: Effort normal and breath sounds normal.  Abdominal: Soft. Bowel sounds are normal.  Musculoskeletal: Normal range of motion.  Neurological: He is alert and oriented to person, place, and time.  Skin: Skin is warm and dry. Capillary refill takes less than 2 seconds.  Psychiatric: He has a normal mood and affect. His behavior is normal. Judgment and thought content normal.  Nursing note and vitals reviewed.  Assessment & Plan:   1. Encounter to establish care He is a [facility]  - POCT urinalysis dipstick  2. 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[facility]) He is  stable today. He signed pain contract with [place] for provisions on provider rules on continual prescribing opioids. Contract reviewed, day hospital guidelines discussed, and patient verified understanding.  - CBC with Differential - Comprehensive metabolic panel - Sickle Cell Panel - Reticulocytes - Hemoglobinopathy evaluation - oxyCODONE (OXY IR/ROXICODONE) 5 MG immediate release tablet; Take 1 tablet (5 mg total) by mouth every 6 (six) hours as needed for severe pain.  Dispense: 60 tablet; Refill: 0  3. Chronic pain syndrome His pain is table today. We will refill Oxy-IR today.  - oxyCODONE (OXY IR/ROXICODONE) 5 MG immediate release tablet; Take 1 tablet (5 mg total) by mouth every 6 (six) hours as needed for severe pain.  Dispense: 60 tablet; Refill: 0  4. Urinary tract infection with hematuria, site unspecified - sulfamethoxazole-trimethoprim (BACTRIM DS,SEPTRA DS) 800-160 MG tablet; Take 1 tablet by mouth 2 (two) times daily.  Dispense: 14 tablet; Refill: 0  5. Abnormal urinalysis - Urine Culture  6. Follow up He will follow up in 6 weeks.  Meds ordered this encounter   Medications  . sulfamethoxazole-trimethoprim (BACTRIM DS,SEPTRA DS) 800-160 MG tablet    Sig: Take 1 tablet by mouth 2 (two) times daily.    Dispense:  14 tablet    Refill:  0  . oxyCODONE (OXY IR/ROXICODONE) 5 MG immediate release tablet    Sig: Take 1 tablet (5 mg total) by mouth every 6 (six) hours as needed for severe pain.    Dispense:  60 tablet    Refill:  0    Order Specific Question:   Supervising Provider    Answer:   Tresa Garter [[id number]]    Kathe Becton,  MSN, FNP-C [facility] [address] [place], [place] [address] [phone]

## 2018-06-27 ENCOUNTER — Encounter: Payer: Self-pay | Admitting: Family Medicine

## 2018-06-27 NOTE — Addendum Note (Signed)
Addended by: Kallie LocksSTROUD, Tomas Schamp M on: 06/27/2018 10:02 PM   Modules accepted: Level of Service

## 2018-06-28 LAB — URINE CULTURE: Organism ID, Bacteria: NO GROWTH

## 2018-07-01 LAB — CMP14+CBC/D/PLT+FER+RETIC+V...
ALT: 14 IU/L (ref 0–44)
AST: 37 IU/L (ref 0–40)
Albumin/Globulin Ratio: 1.9 (ref 1.2–2.2)
Albumin: 4.6 g/dL (ref 3.5–5.5)
Alkaline Phosphatase: 81 IU/L (ref 56–127)
BUN/Creatinine Ratio: 12 (ref 9–20)
BUN: 7 mg/dL (ref 6–20)
Basophils Absolute: 0.1 10*3/uL (ref 0.0–0.2)
Basos: 1 %
Bilirubin Total: 2.8 mg/dL — ABNORMAL HIGH (ref 0.0–1.2)
CO2: 23 mmol/L (ref 20–29)
Calcium: 9 mg/dL (ref 8.7–10.2)
Chloride: 105 mmol/L (ref 96–106)
Creatinine, Ser: 0.58 mg/dL — ABNORMAL LOW (ref 0.76–1.27)
EOS (ABSOLUTE): 0.2 10*3/uL (ref 0.0–0.4)
Eos: 2 %
Ferritin: 266 ng/mL — ABNORMAL HIGH (ref 16–124)
GFR calc Af Amer: 172 mL/min/{1.73_m2} (ref >59)
GFR calc non Af Amer: 149 mL/min/{1.73_m2} (ref >59)
Globulin, Total: 2.4 g/dL (ref 1.5–4.5)
Glucose: 81 mg/dL (ref 65–99)
Hematocrit: 28.5 % — ABNORMAL LOW (ref 37.5–51.0)
Hemoglobin: 9.6 g/dL — ABNORMAL LOW (ref 13.0–17.7)
Immature Grans (Abs): 0 10*3/uL (ref 0.0–0.1)
Immature Granulocytes: 0 %
Lymphocytes Absolute: 3.3 10*3/uL — ABNORMAL HIGH (ref 0.7–3.1)
Lymphs: 40 %
MCH: 31.1 pg (ref 26.6–33.0)
MCHC: 33.7 g/dL (ref 31.5–35.7)
MCV: 92 fL (ref 79–97)
Monocytes Absolute: 0.9 10*3/uL (ref 0.1–0.9)
Monocytes: 11 %
Neutrophils Absolute: 3.7 10*3/uL (ref 1.4–7.0)
Neutrophils: 46 %
Platelets: 405 10*3/uL (ref 150–450)
Potassium: 4.7 mmol/L (ref 3.5–5.2)
RBC: 3.09 x10E6/uL — ABNORMAL LOW (ref 4.14–5.80)
RDW: 20 % — ABNORMAL HIGH (ref 12.3–15.4)
Retic Ct Pct: 5.1 % — ABNORMAL HIGH (ref 0.6–2.6)
Sodium: 142 mmol/L (ref 134–144)
Total Protein: 7 g/dL (ref 6.0–8.5)
Vit D, 25-Hydroxy: 12.1 ng/mL — ABNORMAL LOW (ref 30.0–100.0)
WBC: 8.1 10*3/uL (ref 3.4–10.8)

## 2018-07-01 LAB — HEMOGLOBINOPATHY EVALUATION
HGB C: 0 %
HGB S: 88.5 % — ABNORMAL HIGH
HGB VARIANT: 0 %
Hemoglobin A2 Quantitation: 3.8 % — ABNORMAL HIGH (ref 1.8–3.2)
Hemoglobin F Quantitation: 7.7 % — ABNORMAL HIGH (ref 0.0–2.0)
Hgb A: 0 % — ABNORMAL LOW (ref 96.4–98.8)

## 2018-07-06 ENCOUNTER — Telehealth: Payer: Self-pay

## 2018-07-06 NOTE — Telephone Encounter (Signed)
-----   Message from Natalie M Stroud, FNP sent at 07/05/2018  8:53 PM EDT ----- Regarding: "Lab Results" Carrie,   Please inform patient that he has a low Vitamin D level. We recommend that he takes a daily OTC Vitamin D supplement of 800-1000 IUs daily to increase vitamin D levels. We will re-evaluate in 3 months.     Thank you.       

## 2018-07-06 NOTE — Telephone Encounter (Signed)
Left a vm for patient to callback 

## 2018-07-08 NOTE — Telephone Encounter (Signed)
Patient notified

## 2018-07-08 NOTE — Telephone Encounter (Signed)
-----   Message from Natalie M Stroud, FNP sent at 07/05/2018  8:53 PM EDT ----- Regarding: "Lab Results" Carrie,   Please inform patient that he has a low Vitamin D level. We recommend that he takes a daily OTC Vitamin D supplement of 800-1000 IUs daily to increase vitamin D levels. We will re-evaluate in 3 months.     Thank you.       

## 2018-07-08 NOTE — Telephone Encounter (Signed)
-----   Message from Kallie LocksNatalie M Stroud, FNP sent at 07/05/2018  8:53 PM EDT ----- Regarding: "Lab Results" Lyla Sonarrie,   Please inform patient that he has a low Vitamin D level. We recommend that he takes a daily OTC Vitamin D supplement of [phone] IUs daily to increase vitamin D levels. We will re-evaluate in 3 months.     Thank you.

## 2018-07-08 NOTE — Telephone Encounter (Signed)
Left a vm for patient to callback 

## 2018-08-10 ENCOUNTER — Ambulatory Visit: Payer: Medicaid Other | Admitting: Family Medicine

## 2018-08-25 ENCOUNTER — Other Ambulatory Visit: Payer: Self-pay

## 2018-08-25 ENCOUNTER — Emergency Department (HOSPITAL_COMMUNITY)
Admission: EM | Admit: 2018-08-25 | Discharge: 2018-08-26 | Disposition: A | Discharge status: Home / Self Care | Payer: Medicaid Other | Source: Home / Self Care | Attending: Emergency Medicine | Admitting: Emergency Medicine

## 2018-08-25 DIAGNOSIS — D57219 Sickle-cell/Hb-C disease with crisis, unspecified: Secondary | ICD-10-CM | POA: Insufficient documentation

## 2018-08-25 DIAGNOSIS — R0602 Shortness of breath: Secondary | ICD-10-CM | POA: Insufficient documentation

## 2018-08-25 DIAGNOSIS — D649 Anemia, unspecified: Secondary | ICD-10-CM | POA: Diagnosis not present

## 2018-08-25 DIAGNOSIS — Z79899 Other long term (current) drug therapy: Secondary | ICD-10-CM

## 2018-08-25 DIAGNOSIS — D5700 Hb-SS disease with crisis, unspecified: Secondary | ICD-10-CM

## 2018-08-25 DIAGNOSIS — J45909 Unspecified asthma, uncomplicated: Secondary | ICD-10-CM

## 2018-08-25 DIAGNOSIS — R079 Chest pain, unspecified: Secondary | ICD-10-CM | POA: Diagnosis not present

## 2018-08-25 LAB — COMPREHENSIVE METABOLIC PANEL
ALT: 25 U/L (ref 0–44)
AST: 51 U/L — ABNORMAL HIGH (ref 15–41)
Albumin: 4.2 g/dL (ref 3.5–5.0)
Alkaline Phosphatase: 87 U/L (ref 38–126)
Anion gap: 9 (ref 5–15)
BUN: 9 mg/dL (ref 6–20)
CO2: 23 mmol/L (ref 22–32)
Calcium: 9.2 mg/dL (ref 8.9–10.3)
Chloride: 104 mmol/L (ref 98–111)
Creatinine, Ser: 0.69 mg/dL (ref 0.61–1.24)
GFR calc Af Amer: >60 mL/min (ref >60)
GFR calc non Af Amer: >60 mL/min (ref >60)
Glucose, Bld: 99 mg/dL (ref 70–99)
Potassium: 4.5 mmol/L (ref 3.5–5.1)
Sodium: 136 mmol/L (ref 135–145)
Total Bilirubin: 3.1 mg/dL — ABNORMAL HIGH (ref 0.3–1.2)
Total Protein: 6.8 g/dL (ref 6.5–8.1)

## 2018-08-25 LAB — CBC WITH DIFFERENTIAL/PLATELET
Basophils Absolute: 0.1 10*3/uL (ref 0.0–0.1)
Basophils Relative: 1 %
Eosinophils Absolute: 0.3 10*3/uL (ref 0.0–0.7)
Eosinophils Relative: 2 %
HCT: 29 % — ABNORMAL LOW (ref 39.0–52.0)
Hemoglobin: 10.4 g/dL — ABNORMAL LOW (ref 13.0–17.0)
Lymphocytes Relative: 47 %
Lymphs Abs: 6.4 10*3/uL — ABNORMAL HIGH (ref 0.7–4.0)
MCH: 31.2 pg (ref 26.0–34.0)
MCHC: 35.9 g/dL (ref 30.0–36.0)
MCV: 87.1 fL (ref 78.0–100.0)
Monocytes Absolute: 1.5 10*3/uL — ABNORMAL HIGH (ref 0.1–1.0)
Monocytes Relative: 11 %
Neutro Abs: 5.3 10*3/uL (ref 1.7–7.7)
Neutrophils Relative %: 39 %
Platelets: 451 10*3/uL — ABNORMAL HIGH (ref 150–400)
RBC: 3.33 MIL/uL — ABNORMAL LOW (ref 4.22–5.81)
RDW: 18.9 % — ABNORMAL HIGH (ref 11.5–15.5)
WBC: 13.6 10*3/uL — ABNORMAL HIGH (ref 4.0–10.5)

## 2018-08-25 LAB — RETICULOCYTES
RBC.: 3.33 MIL/uL — ABNORMAL LOW (ref 4.22–5.81)
Retic Count, Absolute: 216.5 10*3/uL — ABNORMAL HIGH (ref 19.0–186.0)
Retic Ct Pct: 6.5 % — ABNORMAL HIGH (ref 0.4–3.1)

## 2018-08-25 MED ORDER — HYDROMORPHONE HCL 1 MG/ML IJ SOLN
0.5000 mg | Freq: Once | INTRAMUSCULAR | Status: AC
  Administered 2018-08-25: 0.5 mg via SUBCUTANEOUS
  Filled 2018-08-25: qty 1

## 2018-08-25 NOTE — ED Triage Notes (Signed)
Pt reports he is in a sickle cell crisis. Pt reports pain to chest. Pt took 10 mg oxycodone and `1000 mg tylenol about 30 min ago.

## 2018-08-25 NOTE — ED Notes (Signed)
Pt placed in a gown and hooked up to the  Monitor with pulse ox ,5 lead and bp cuff

## 2018-08-26 ENCOUNTER — Encounter (HOSPITAL_COMMUNITY): Payer: Self-pay | Admitting: *Deleted

## 2018-08-26 ENCOUNTER — Emergency Department (HOSPITAL_COMMUNITY): Payer: Medicaid Other

## 2018-08-26 ENCOUNTER — Other Ambulatory Visit: Payer: Self-pay | Admitting: Family Medicine

## 2018-08-26 ENCOUNTER — Non-Acute Institutional Stay (EMERGENCY_DEPARTMENT_HOSPITAL)
Admission: AD | Admit: 2018-08-26 | Discharge: 2018-08-26 | Disposition: A | Discharge status: Home / Self Care | Payer: Medicaid Other | Source: Ambulatory Visit | Attending: Internal Medicine | Admitting: Internal Medicine

## 2018-08-26 DIAGNOSIS — F1190 Opioid use, unspecified, uncomplicated: Secondary | ICD-10-CM

## 2018-08-26 DIAGNOSIS — D571 Sickle-cell disease without crisis: Secondary | ICD-10-CM

## 2018-08-26 DIAGNOSIS — N08 Glomerular disorders in diseases classified elsewhere: Secondary | ICD-10-CM | POA: Diagnosis not present

## 2018-08-26 DIAGNOSIS — D5701 Hb-SS disease with acute chest syndrome: Secondary | ICD-10-CM | POA: Diagnosis not present

## 2018-08-26 DIAGNOSIS — R079 Chest pain, unspecified: Secondary | ICD-10-CM | POA: Diagnosis not present

## 2018-08-26 DIAGNOSIS — D5700 Hb-SS disease with crisis, unspecified: Secondary | ICD-10-CM | POA: Diagnosis not present

## 2018-08-26 DIAGNOSIS — R402142 Coma scale, eyes open, spontaneous, at arrival to emergency department: Secondary | ICD-10-CM | POA: Diagnosis not present

## 2018-08-26 DIAGNOSIS — Z8349 Family history of other endocrine, nutritional and metabolic diseases: Secondary | ICD-10-CM | POA: Diagnosis not present

## 2018-08-26 DIAGNOSIS — J45909 Unspecified asthma, uncomplicated: Secondary | ICD-10-CM | POA: Diagnosis not present

## 2018-08-26 DIAGNOSIS — D735 Infarction of spleen: Secondary | ICD-10-CM | POA: Diagnosis not present

## 2018-08-26 DIAGNOSIS — G894 Chronic pain syndrome: Secondary | ICD-10-CM

## 2018-08-26 DIAGNOSIS — Z8249 Family history of ischemic heart disease and other diseases of the circulatory system: Secondary | ICD-10-CM | POA: Diagnosis not present

## 2018-08-26 DIAGNOSIS — J154 Pneumonia due to other streptococci: Secondary | ICD-10-CM | POA: Diagnosis not present

## 2018-08-26 DIAGNOSIS — Z832 Family history of diseases of the blood and blood-forming organs and certain disorders involving the immune mechanism: Secondary | ICD-10-CM | POA: Diagnosis not present

## 2018-08-26 DIAGNOSIS — R0902 Hypoxemia: Secondary | ICD-10-CM | POA: Diagnosis not present

## 2018-08-26 DIAGNOSIS — R402362 Coma scale, best motor response, obeys commands, at arrival to emergency department: Secondary | ICD-10-CM | POA: Diagnosis not present

## 2018-08-26 DIAGNOSIS — R402252 Coma scale, best verbal response, oriented, at arrival to emergency department: Secondary | ICD-10-CM | POA: Diagnosis not present

## 2018-08-26 LAB — URINALYSIS, ROUTINE W REFLEX MICROSCOPIC
Bacteria, UA: NONE SEEN
Bilirubin Urine: NEGATIVE
Glucose, UA: NEGATIVE mg/dL
Ketones, ur: NEGATIVE mg/dL
Leukocytes, UA: NEGATIVE
Nitrite: NEGATIVE
Protein, ur: 30 mg/dL — AB
Specific Gravity, Urine: 1.006 (ref 1.005–1.030)
pH: 6 (ref 5.0–8.0)

## 2018-08-26 LAB — COMPREHENSIVE METABOLIC PANEL
ALT: 26 U/L (ref 0–44)
AST: 46 U/L — ABNORMAL HIGH (ref 15–41)
Albumin: 4.3 g/dL (ref 3.5–5.0)
Alkaline Phosphatase: 74 U/L (ref 38–126)
Anion gap: 8 (ref 5–15)
BUN: 8 mg/dL (ref 6–20)
CO2: 26 mmol/L (ref 22–32)
Calcium: 9.3 mg/dL (ref 8.9–10.3)
Chloride: 107 mmol/L (ref 98–111)
Creatinine, Ser: 0.67 mg/dL (ref 0.61–1.24)
GFR calc Af Amer: >60 mL/min (ref >60)
GFR calc non Af Amer: >60 mL/min (ref >60)
Glucose, Bld: 98 mg/dL (ref 70–99)
Potassium: 4.2 mmol/L (ref 3.5–5.1)
Sodium: 141 mmol/L (ref 135–145)
Total Bilirubin: 3.1 mg/dL — ABNORMAL HIGH (ref 0.3–1.2)
Total Protein: 7.2 g/dL (ref 6.5–8.1)

## 2018-08-26 LAB — CBC WITH DIFFERENTIAL/PLATELET
Basophils Absolute: 0.1 10*3/uL (ref 0.0–0.1)
Basophils Relative: 0 %
Eosinophils Absolute: 0.1 10*3/uL (ref 0.0–0.7)
Eosinophils Relative: 1 %
HCT: 27.7 % — ABNORMAL LOW (ref 39.0–52.0)
Hemoglobin: 10.1 g/dL — ABNORMAL LOW (ref 13.0–17.0)
Lymphocytes Relative: 16 %
Lymphs Abs: 2.4 10*3/uL (ref 0.7–4.0)
MCH: 31.1 pg (ref 26.0–34.0)
MCHC: 36.5 g/dL — ABNORMAL HIGH (ref 30.0–36.0)
MCV: 85.2 fL (ref 78.0–100.0)
Monocytes Absolute: 2.1 10*3/uL — ABNORMAL HIGH (ref 0.1–1.0)
Monocytes Relative: 14 %
Neutro Abs: 10.5 10*3/uL — ABNORMAL HIGH (ref 1.7–7.7)
Neutrophils Relative %: 69 %
Platelets: 428 10*3/uL — ABNORMAL HIGH (ref 150–400)
RBC: 3.25 MIL/uL — ABNORMAL LOW (ref 4.22–5.81)
RDW: 19.5 % — ABNORMAL HIGH (ref 11.5–15.5)
WBC: 15.1 10*3/uL — ABNORMAL HIGH (ref 4.0–10.5)

## 2018-08-26 MED ORDER — HYDROMORPHONE 1 MG/ML IV SOLN
INTRAVENOUS | Status: DC
  Administered 2018-08-26: 4.5 mg via INTRAVENOUS
  Administered 2018-08-26: 30 mg via INTRAVENOUS

## 2018-08-26 MED ORDER — SODIUM CHLORIDE 0.9 % IV SOLN
25.0000 mg | INTRAVENOUS | Status: DC | PRN
  Filled 2018-08-26: qty 0.5

## 2018-08-26 MED ORDER — ONDANSETRON HCL 4 MG/2ML IJ SOLN: 4.0000 mg | INTRAMUSCULAR | Status: DC | PRN

## 2018-08-26 MED ORDER — HYDROMORPHONE HCL 2 MG/ML IJ SOLN: 2.0000 mg | Freq: Once | INTRAMUSCULAR | Status: DC

## 2018-08-26 MED ORDER — HYDROMORPHONE HCL 1 MG/ML IJ SOLN: 2.0000 mg | INTRAMUSCULAR | Status: AC

## 2018-08-26 MED ORDER — KETOROLAC TROMETHAMINE 30 MG/ML IJ SOLN
30.0000 mg | INTRAMUSCULAR | Status: AC
  Administered 2018-08-26: 30 mg via INTRAVENOUS
  Filled 2018-08-26: qty 1

## 2018-08-26 MED ORDER — HYDROMORPHONE HCL 1 MG/ML IJ SOLN
2.0000 mg | INTRAMUSCULAR | Status: DC
  Administered 2018-08-26: 2 mg via INTRAVENOUS
  Filled 2018-08-26: qty 2

## 2018-08-26 MED ORDER — ONDANSETRON HCL 4 MG/2ML IJ SOLN: 4.0000 mg | Freq: Four times a day (QID) | INTRAMUSCULAR | Status: DC | PRN

## 2018-08-26 MED ORDER — IOPAMIDOL (ISOVUE-370) INJECTION 76%
100.0000 mL | Freq: Once | INTRAVENOUS | Status: AC | PRN
  Administered 2018-08-26: 80 mL via INTRAVENOUS

## 2018-08-26 MED ORDER — DIPHENHYDRAMINE HCL 50 MG/ML IJ SOLN
25.0000 mg | Freq: Once | INTRAMUSCULAR | Status: AC
  Administered 2018-08-26: 25 mg via INTRAVENOUS
  Filled 2018-08-26: qty 1

## 2018-08-26 MED ORDER — DIPHENHYDRAMINE HCL 25 MG PO CAPS: 25.0000 mg | ORAL_CAPSULE | ORAL | Status: DC | PRN

## 2018-08-26 MED ORDER — HYDROMORPHONE HCL 1 MG/ML IJ SOLN
2.0000 mg | INTRAMUSCULAR | Status: AC
  Administered 2018-08-26: 2 mg via INTRAVENOUS
  Filled 2018-08-26: qty 2

## 2018-08-26 MED ORDER — HYDROMORPHONE 1 MG/ML IV SOLN
INTRAVENOUS | Status: DC
  Filled 2018-08-26: qty 30

## 2018-08-26 MED ORDER — DEXTROSE-NACL 5-0.45 % IV SOLN
INTRAVENOUS | Status: DC
  Administered 2018-08-26: 12:00:00 via INTRAVENOUS

## 2018-08-26 MED ORDER — HYDROMORPHONE HCL 1 MG/ML IJ SOLN: 2.0000 mg | INTRAMUSCULAR | Status: DC

## 2018-08-26 MED ORDER — OXYCODONE HCL 5 MG PO TABS: 5.0000 mg | ORAL_TABLET | Freq: Four times a day (QID) | ORAL | 0 refills | Status: DC | PRN

## 2018-08-26 MED ORDER — OXYCODONE HCL 5 MG PO TABS
10.0000 mg | ORAL_TABLET | Freq: Once | ORAL | Status: AC
  Administered 2018-08-26: 10 mg via ORAL
  Filled 2018-08-26: qty 2

## 2018-08-26 MED ORDER — DEXTROSE-NACL 5-0.45 % IV SOLN
INTRAVENOUS | Status: DC
  Administered 2018-08-26: 01:00:00 via INTRAVENOUS

## 2018-08-26 MED ORDER — NALOXONE HCL 0.4 MG/ML IJ SOLN: 0.4000 mg | INTRAMUSCULAR | Status: DC | PRN

## 2018-08-26 MED ORDER — SODIUM CHLORIDE 0.9% FLUSH: 9.0000 mL | INTRAVENOUS | Status: DC | PRN

## 2018-08-26 MED ORDER — IOPAMIDOL (ISOVUE-370) INJECTION 76%
INTRAVENOUS | Status: AC
  Administered 2018-08-26: 80 mL via INTRAVENOUS
  Filled 2018-08-26: qty 100

## 2018-08-26 MED ORDER — KETOROLAC TROMETHAMINE 30 MG/ML IJ SOLN
15.0000 mg | Freq: Once | INTRAMUSCULAR | Status: AC
  Administered 2018-08-26: 15 mg via INTRAVENOUS
  Filled 2018-08-26: qty 1

## 2018-08-26 NOTE — Progress Notes (Signed)
Patient came  to the lobby, complained of pain in the chest and back that he rated as 10/10. Endorsed chest pain with shortness of breath, denied fever, diarrhea, abdominal pain and nausea/vomitting.  Provider notified; per provider, can be admitted for treatment. Patient notified, verbalized understanding.

## 2018-08-26 NOTE — H&P (Signed)
[facility] History and Physical   Date: 08/26/2018  Patient name: [name] Hill Medical record number: [id number] Date of birth: 12-27-2000 Age: 17 y.o. Gender: male PCP: Kallie Locks, FNP  Attending physician: Quentin Angst, MD  Chief Complaint: Generalized pain  History of Present Illness: [name] Hill, 17 year old male with history of sickle cell anemia and chronic pain presents complaining of generalized pain.  Patient attributes current pain crisis to increased stress.  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  Current pain intensity 9 out of 10 sharp.  Pain is primarily to mid chest.  Patient was evaluated in the emergency department on 08/25/2018 and underwent a chest CT, which showed no acute cardiopulmonary process or pulmonary embolism.  Patient last had oxycodone on last night without sustained relief. Denies headache, dyspnea, dysuria, nausea, vomiting, or diarrhea.   Meds: Medications Prior to Admission  Medication Sig Dispense Refill Last Dose  . acetaminophen (TYLENOL) 650 MG CR tablet Take 1,300 mg by mouth every 8 (eight) hours as needed for pain.   Past Month at Unknown time  . albuterol (PROVENTIL HFA;VENTOLIN HFA) 108 (90 Base) MCG/ACT inhaler Inhale 2 puffs into the lungs every 4 (four) hours as needed for wheezing or shortness of breath. 1 Inhaler 3 Past Month at Unknown time  . hydroxyurea (DROXIA) 400 MG capsule Take 4 capsules (1,600 mg total) by mouth daily. 40 capsule 0 08/25/2018 at Unknown time  . lisinopril (PRINIVIL,ZESTRIL) 10 MG tablet Take 1 tablet (10 mg total) by mouth daily. 30 tablet 0 08/25/2018 at Unknown time  . loperamide (IMODIUM) 2 MG capsule Take 1 capsule (2 mg total) 4 (four) times daily as needed by mouth for diarrhea or loose stools. 12 capsule 0 unk  . oxyCODONE (OXY IR/ROXICODONE) 5 MG immediate release tablet Take 1 tablet (5 mg total) by mouth every 6 (six) hours as needed for severe pain. 60  tablet 0 08/25/2018 at Unknown time  . sulfamethoxazole-trimethoprim (BACTRIM DS,SEPTRA DS) 800-160 MG tablet Take 1 tablet by mouth 2 (two) times daily. (Patient not taking: Reported on 08/26/2018) 14 tablet 0 Not Taking at Unknown time    Allergies: Other Past Medical History:  Diagnosis Date  . 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 Blurred vision   . Sickle cell anemia (HCC)   . Sickle cell crisis (HCC)   . Sickle cell nephropathy (HCC)    Past Surgical History:  Procedure Laterality Date  . CIRCUMCISION    . TONSILLECTOMY    . TONSILLECTOMY AND ADENOIDECTOMY     Family History  Problem Relation Age of Onset  . Sickle cell anemia Brother   . Hypertension Maternal Grandmother   . Anemia Mother    Social History   Socioeconomic History  . Marital status: Single    Spouse name: Not on file  . Number of children: Not on file  . Years of education: Not on file  . Highest education level: Not on file  Occupational History  . Not on file  Social Needs  . Financial resource strain: Not on file  . Food insecurity:    Worry: Not on file    Inability: Not on file  . Transportation needs:    Medical: Not on file    Non-medical: Not on file  Tobacco Use  . Smoking status: Never Smoker  . Smokeless tobacco: Never Used  Substance and Sexual Activity  . Alcohol use: No  . Drug use: No  . Sexual activity: Not on file  Lifestyle  . Physical activity:    Days per week: Not on file    Minutes per session: Not on file  . Stress: Not on file  Relationships  . Social connections:    Talks on phone: Not on file    Gets together: Not on file    Attends religious service: Not on file    Active member of club or organization: Not on file    Attends meetings of clubs or organizations: Not on file    Relationship status: Not on file  . Intimate partner violence:    Fear of current or ex partner: Not on file     Emotionally abused: Not on file    Physically abused: Not on file    Forced sexual activity: Not on file  Other Topics Concern  . Not on file  Social History Narrative   Lives at home with mother, 6 siblings and sometimes grandmother.   Review of Systems  Constitutional: Negative.   HENT: Negative.   Eyes: Negative.   Respiratory: Negative.   Cardiovascular: Positive for chest pain. Negative for palpitations.  Gastrointestinal: Negative.   Genitourinary: Negative.   Musculoskeletal: Positive for back pain and joint pain.  Skin: Negative.   Neurological: Negative.   Endo/Heme/Allergies: Negative for polydipsia.  Psychiatric/Behavioral: Negative.     Physical Exam: Blood pressure (!) 148/93, pulse 82, temperature 99 F (37.2 C), temperature source Oral, resp. rate 18, SpO2 97 %. Physical Exam  Constitutional: He is oriented to person, place, and time. He appears distressed.  HENT:  Head: Normocephalic.  Eyes: Pupils are equal, round, and reactive to light.  Cardiovascular: Normal rate, regular rhythm, normal heart sounds and intact distal pulses.  Pulmonary/Chest: Effort normal and breath sounds normal.  Abdominal: Soft. Bowel sounds are normal.  Musculoskeletal: Normal range of motion.  Neurological: He is alert and oriented to person, place, and time.  Skin: Skin is warm.  Psychiatric: He has a normal mood and affect. His behavior is normal. Thought content normal.    Lab results: Results for orders placed or performed during the hospital encounter of 08/25/18 (from the past 24 hour(s))  Comprehensive metabolic panel     Status: Abnormal   Collection Time: 08/25/18 10:09 PM  Result Value Ref Range   Sodium 136 135 - 145 mmol/L   Potassium 4.5 3.5 - 5.1 mmol/L   Chloride 104 98 - 111 mmol/L   CO2 23 22 - 32 mmol/L   Glucose, Bld 99 70 - 99 mg/dL   BUN 9 6 - 20 mg/dL   Creatinine, Ser 8.29 0.61 - 1.24 mg/dL   Calcium 9.2 8.9 - 56.2 mg/dL   Total Protein 6.8 6.5  - 8.1 g/dL   Albumin 4.2 3.5 - 5.0 g/dL   AST 51 (H) 15 - 41 U/L   ALT 25 0 - 44 U/L   Alkaline Phosphatase 87 38 - 126 U/L   Total Bilirubin 3.1 (H) 0.3 - 1.2 mg/dL   GFR calc non Af Amer >60 >60 mL/min   GFR calc Af Amer >60 >60 mL/min   Anion gap 9 5 - 15  CBC with Differential     Status: Abnormal   Collection Time: 08/25/18 10:09 PM  Result Value Ref Range   WBC 13.6 (H) 4.0 - 10.5 K/uL   RBC 3.33 (L) 4.22 - 5.81 MIL/uL  Hemoglobin 10.4 (L) 13.0 - 17.0 g/dL   HCT 16.1 (L) 09.6 - 04.5 %   MCV 87.1 78.0 - 100.0 fL   MCH 31.2 26.0 - 34.0 pg   MCHC 35.9 30.0 - 36.0 g/dL   RDW 40.9 (H) 81.1 - 91.4 %   Platelets 451 (H) 150 - 400 K/uL   Neutrophils Relative % 39 %   Lymphocytes Relative 47 %   Monocytes Relative 11 %   Eosinophils Relative 2 %   Basophils Relative 1 %   Neutro Abs 5.3 1.7 - 7.7 K/uL   Lymphs Abs 6.4 (H) 0.7 - 4.0 K/uL   Monocytes Absolute 1.5 (H) 0.1 - 1.0 K/uL   Eosinophils Absolute 0.3 0.0 - 0.7 K/uL   Basophils Absolute 0.1 0.0 - 0.1 K/uL   RBC Morphology HOWELL/JOLLY BODIES   Reticulocytes     Status: Abnormal   Collection Time: 08/25/18 10:09 PM  Result Value Ref Range   Retic Ct Pct 6.5 (H) 0.4 - 3.1 %   RBC. 3.33 (L) 4.22 - 5.81 MIL/uL   Retic Count, Absolute 216.5 (H) 19.0 - 186.0 K/uL    Imaging results:  Ct Angio Chest Pe W Or Wo Contrast  Result Date: 08/26/2018 CLINICAL DATA:  Chest and back pain in sickle cell crisis EXAM: CT ANGIOGRAPHY CHEST WITH CONTRAST TECHNIQUE: Multidetector CT imaging of the chest was performed using the standard protocol during bolus administration of intravenous contrast. Multiplanar CT image reconstructions and MIPs were obtained to evaluate the vascular anatomy. CONTRAST:  80mL ISOVUE-370 IOPAMIDOL (ISOVUE-370) INJECTION 76% COMPARISON:  CTA chest dated 05/16/2018 FINDINGS: Cardiovascular: Satisfactory opacification the bilateral pulmonary arteries to the lobar level. No evidence of pulmonary embolism. Enlargement  of the main pulmonary artery. No evidence of thoracic aortic aneurysm or dissection. The heart is normal in size.  No pericardial effusion. Mediastinum/Nodes: No suspicious mediastinal lymphadenopathy. Visualized thyroid is unremarkable. Lungs/Pleura: Evaluation of the lung parenchyma is constrained by respiratory motion. No suspicious pulmonary nodules. Mild mosaic attenuation. No focal consolidation. No pleural effusion or pneumothorax. Upper Abdomen: Visualized upper abdomen is notable for a hyperdense spleen, related to sickle cell disease. Musculoskeletal: H-shaped lower thoracic vertebral bodies, with endplate changes related to sickle cell disease. Review of the MIP images confirms the above findings. IMPRESSION: No evidence of pulmonary embolism. No evidence of acute cardiopulmonary disease. Electronically Signed   By: Charline Bills M.D.   On: 08/26/2018 01:23     Assessment & Plan:  Patient will be admitted to the day infusion center for extended observation  Start IV D5.45 for cellular rehydration at 125/hr  Start Toradol 30 mg IV times one for inflammation.  Start Dilaudid PCA High Concentration per weight based protocol.   Patient will be re-evaluated for pain intensity in the context of function and relationship to baseline as care progresses.  If no significant pain relief, will transfer patient to inpatient services for a higher level of care.   Will check CMP and CBC w/differential   Nolon Nations  APRN, MSN, FNP-C [facility] [address], [place] [address] [phone]  08/26/2018, 11:41 AM

## 2018-08-26 NOTE — ED Notes (Signed)
Patient transported to CT 

## 2018-08-26 NOTE — Progress Notes (Signed)
Rx for Oxy-IR sent to pharmacy today.  

## 2018-08-26 NOTE — ED Provider Notes (Signed)
[facility] EMERGENCY DEPARTMENT Provider Note   CSN: [id number] Arrival date & time: 08/25/18  2148     History   Chief Complaint Chief Complaint  Patient presents with  . Sickle Cell Pain Crisis    HPI [name] Hill is a 17 y.o. male.  Patient presents to the emergency department stating that he is experiencing a sickle cell crisis.  Patient reports that he had sudden onset of severe chest and upper back pain 1 hour before coming to the ER.  He took his home oxycodone and Tylenol but it did not help his pain.  He does report that he feels short of breath.  He has not had fever or cough.     Past Medical History:  Diagnosis Date  . 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 Blurred vision   . Sickle cell anemia (HCC)   . Sickle cell crisis (HCC)   . Sickle cell nephropathy [facility])     Patient Active Problem List   Diagnosis Date Noted  . Acute chest pain   . Acute chest syndrome (HCC) 05/16/2018  . Leukocytosis   . Transaminitis   . 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 Family circumstance 07/03/2016  . 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 Asthma 06/03/2012  . Abnormal presence of protein in urine 06/03/2012  . Sickle cell pain crisis (HCC) 04/21/2012    Past Surgical History:  Procedure Laterality Date  . CIRCUMCISION    . TONSILLECTOMY    . TONSILLECTOMY AND ADENOIDECTOMY          Home Medications    Prior to Admission medications   Medication Sig Start Date End Date Taking? Authorizing Provider  acetaminophen (TYLENOL) 650 MG CR tablet Take 1,300 mg by mouth every 8 (eight) hours as needed for pain.    [provider]  albuterol (PROVENTIL HFA;VENTOLIN HFA) 108 (90 Base) MCG/ACT inhaler Inhale 2 puffs into the lungs every 4 (four) hours as needed for wheezing or shortness of breath. 10/05/16   [name], MD  hydroxyurea  (DROXIA) 400 MG capsule Take 4 capsules (1,600 mg total) by mouth daily. 10/05/16   [name], MD  lisinopril (PRINIVIL,ZESTRIL) 10 MG tablet Take 1 tablet (10 mg total) by mouth daily. 10/05/16   [name], MD  loperamide (IMODIUM) 2 MG capsule Take 1 capsule (2 mg total) 4 (four) times daily as needed by mouth for diarrhea or loose stools. 10/05/17   Audry Pili, PA-C  oxyCODONE (OXY IR/ROXICODONE) 5 MG immediate release tablet Take 1 tablet (5 mg total) by mouth every 6 (six) hours as needed for severe pain. 06/26/18   Kallie Locks, FNP  sulfamethoxazole-trimethoprim (BACTRIM DS,SEPTRA DS) 800-160 MG tablet Take 1 tablet by mouth 2 (two) times daily. 06/26/18   Kallie Locks, FNP    Family History Family History  Problem Relation Age of Onset  . Sickle cell anemia Brother   . Hypertension Maternal Grandmother   . Anemia Mother     Social History Social History   Tobacco Use  . Smoking status: Never Smoker  . Smokeless tobacco: Never Used  Substance Use Topics  . Alcohol use: No  . Drug use: No     Allergies   Other   Review of Systems Review of Systems  Respiratory: Positive for shortness of breath.   Cardiovascular: Positive for chest pain.  All  other systems reviewed and are negative.    Physical Exam Updated Vital Signs BP 127/84   Pulse 66   Temp 98.5 F (36.9 C) (Oral)   Resp (!) 22   SpO2 93%   Physical Exam  Constitutional: He is oriented to person, place, and time. He appears well-developed and well-nourished. He appears distressed.  HENT:  Head: Normocephalic and atraumatic.  Right Ear: Hearing normal.  Left Ear: Hearing normal.  Nose: Nose normal.  Mouth/Throat: Oropharynx is clear and moist and mucous membranes are normal.  Eyes: Pupils are equal, round, and reactive to light. Conjunctivae and EOM are normal.  Neck: Normal range of motion. Neck supple.  Cardiovascular: Regular rhythm, S1 normal and S2 normal. Exam reveals no  gallop and no friction rub.  No murmur heard. Pulmonary/Chest: Effort normal and breath sounds normal. No respiratory distress. He exhibits no tenderness.  Abdominal: Soft. Normal appearance and bowel sounds are normal. There is no hepatosplenomegaly. There is no tenderness. There is no rebound, no guarding, no tenderness at McBurney's point and negative Murphy's sign. No hernia.  Musculoskeletal: Normal range of motion.  Neurological: He is alert and oriented to person, place, and time. He has normal strength. No cranial nerve deficit or sensory deficit. Coordination normal. GCS eye subscore is 4. GCS verbal subscore is 5. GCS motor subscore is 6.  Skin: Skin is warm, dry and intact. No rash noted. No cyanosis.  Psychiatric: He has a normal mood and affect. His speech is normal and behavior is normal. Thought content normal.  Nursing note and vitals reviewed.    ED Treatments / Results  Labs (all labs ordered are listed, but only abnormal results are displayed) Labs Reviewed  COMPREHENSIVE METABOLIC PANEL - Abnormal; Notable for the following components:      Result Value   AST 51 (*)    Total Bilirubin 3.1 (*)    All other components within normal limits  CBC WITH DIFFERENTIAL/PLATELET - Abnormal; Notable for the following components:   WBC 13.6 (*)    RBC 3.33 (*)    Hemoglobin 10.4 (*)    HCT 29.0 (*)    RDW 18.9 (*)    Platelets 451 (*)    Lymphs Abs 6.4 (*)    Monocytes Absolute 1.5 (*)    All other components within normal limits  RETICULOCYTES - Abnormal; Notable for the following components:   Retic Ct Pct 6.5 (*)    RBC. 3.33 (*)    Retic Count, Absolute 216.5 (*)    All other components within normal limits    EKG EKG Interpretation  Date/Time:  Tuesday August 25 2018 21:57:49 EDT Ventricular Rate:  74 PR Interval:  202 QRS Duration: 98 QT Interval:  358 QTC Calculation: 397 R Axis:   90 Text Interpretation:  Normal sinus rhythm with sinus arrhythmia  Rightward axis Cannot rule out Anterior infarct , age undetermined Abnormal ECG Confirmed by Gilda Crease [phone]) on 08/26/2018 12:11:38 AM   Radiology No results found.  Procedures Procedures (including critical care time)  Medications Ordered in ED Medications  dextrose 5 %-0.45 % sodium chloride infusion (has no administration in time range)  ketorolac (TORADOL) 30 MG/ML injection 30 mg (has no administration in time range)  HYDROmorphone (DILAUDID) injection 2 mg (has no administration in time range)    Or  HYDROmorphone (DILAUDID) injection 2 mg (has no administration in time range)  HYDROmorphone (DILAUDID) injection 2 mg (has no administration in time range)  Or  HYDROmorphone (DILAUDID) injection 2 mg (has no administration in time range)  HYDROmorphone (DILAUDID) injection 2 mg (has no administration in time range)    Or  HYDROmorphone (DILAUDID) injection 2 mg (has no administration in time range)  HYDROmorphone (DILAUDID) injection 2 mg (has no administration in time range)    Or  HYDROmorphone (DILAUDID) injection 2 mg (has no administration in time range)  diphenhydrAMINE (BENADRYL) injection 25 mg (has no administration in time range)  ondansetron (ZOFRAN) injection 4 mg (has no administration in time range)  HYDROmorphone (DILAUDID) injection 0.5 mg (0.5 mg Subcutaneous Given 08/25/18 2205)     Initial Impression / Assessment and Plan / ED Course  I have reviewed the triage vital signs and the nursing notes.  Pertinent labs & imaging results that were available during my care of the patient were reviewed by me and considered in my medical decision making (see chart for details).     Patient presents to the emergency department for evaluation of sickle cell crisis.  Patient reports pain in his chest and back.  Looking through his records, this is a common complaint with his sickle cell disease.  His lab work is reassuring.  He underwent CT angiography of  chest, no lung infarction, no PE.  This is not consistent with acute chest.  Patient has had significant improvement with serial analgesic doses.  He is appropriate for discharge.  CRITICAL CARE Performed by: Gilda Crease   Total critical care time: 30 minutes  Critical care time was exclusive of separately billable procedures and treating other patients.  Critical care was necessary to treat or prevent imminent or life-threatening deterioration.  Critical care was time spent personally by me on the following activities: development of treatment plan with patient and/or surrogate as well as nursing, discussions with consultants, evaluation of patient's response to treatment, examination of patient, obtaining history from patient or surrogate, ordering and performing treatments and interventions, ordering and review of laboratory studies, ordering and review of radiographic studies, pulse oximetry and re-evaluation of patient's condition.   Final Clinical Impressions(s) / ED Diagnoses   Final diagnoses:  None    ED Discharge Orders    None       Pollina, Canary Brim, MD 08/26/18 [phone]

## 2018-08-26 NOTE — ED Notes (Signed)
Patient verbalizes understanding of discharge instructions. Opportunity for questioning and answers were provided. Armband removed by staff, pt discharged from ED ambulatory.   

## 2018-08-26 NOTE — Discharge Instructions (Signed)
Resume all home medications Oxycodone 5 mg every 4-6 hours as needed for moderate to severe pain Increase water intake to 64 ounces per day  The patient was given clear instructions to go to ER or return to medical center if symptoms do not improve, worsen or new problems develop. The patient verbalized understanding.     Sickle Cell Anemia, Adult Sickle cell anemia is a condition where your red blood cells are shaped like sickles. Red blood cells carry oxygen through the body. Sickle-shaped red blood cells do not live as long as normal red blood cells. They also clump together and block blood from flowing through the blood vessels. These things prevent the body from getting enough oxygen. Sickle cell anemia causes organ damage and pain. It also increases the risk of infection. Follow these instructions at home:  Drink enough fluid to keep your pee (urine) clear or pale yellow. Drink more in hot weather and during exercise.  Do not smoke. Smoking lowers oxygen levels in the blood.  Only take over-the-counter or prescription medicines as told by your doctor.  Take antibiotic medicines as told by your doctor. Make sure you finish them even if you start to feel better.  Take supplements as told by your doctor.  Consider wearing a medical alert bracelet. This tells anyone caring for you in an emergency of your condition.  When traveling, keep your medical information, doctors' names, and the medicines you take with you at all times.  If you have a fever, do not take fever medicines right away. This could cover up a problem. Tell your doctor.  Keep all follow-up visits with your doctor. Sickle cell anemia requires regular medical care. Contact a doctor if: You have a fever. Get help right away if:  You feel dizzy or faint.  You have new belly (abdominal) pain, especially on the left side near the stomach area.  You have a lasting, often uncomfortable and painful erection of the penis  (priapism). If it is not treated right away, you will become unable to have sex (impotence).  You have numbness in your arms or legs or you have a hard time moving them.  You have a hard time talking.  You have a fever or lasting symptoms for more than 2-3 days.  You have a fever and your symptoms suddenly get worse.  You have signs or symptoms of infection. These include: ? Chills. ? Being more tired than normal (lethargy). ? Irritability. ? Poor eating. ? Throwing up (vomiting).  You have pain that is not helped with medicine.  You have shortness of breath.  You have pain in your chest.  You are coughing up pus-like or bloody mucus.  You have a stiff neck.  Your feet or hands swell or have pain.  Your belly looks bloated.  Your joints hurt. This information is not intended to replace advice given to you by your health care provider. Make sure you discuss any questions you have with your health care provider. Document Released: 09/01/2013 Document Revised: 04/18/2016 Document Reviewed: 06/23/2013 Elsevier Interactive Patient Education  2017 [employer].

## 2018-08-26 NOTE — Progress Notes (Signed)
Patient came to waiting room of day hospital complaining of pain in chest and back rated 9/10. Patient admitted to day hospital for management of sickle cell pain crisis. For pain control, patient placed on Dilaudid PCA, given 15 mg IV Toradol, 10 mg Oxycodone and hydrated with IV fluids. At discharge, patient reported pain level at 4/10. Vital signs remained stable throughout admission. Discharge instructions given to patient. Patient advised to take prescribed pain medications around the clock and hydrate with 64 ounces of water at home. Patient can call back to day hospital in the morning if pain is still elevated. Patient alert, oriented and ambulatory at discharge. Patient discharged home with grandmother.

## 2018-08-27 ENCOUNTER — Encounter (HOSPITAL_COMMUNITY): Payer: Self-pay | Admitting: *Deleted

## 2018-08-27 ENCOUNTER — Emergency Department (HOSPITAL_COMMUNITY): Payer: Medicaid Other

## 2018-08-27 ENCOUNTER — Inpatient Hospital Stay (HOSPITAL_COMMUNITY)
Admission: EM | Admit: 2018-08-27 | Discharge: 2018-08-30 | DRG: 811 | Disposition: A | Discharge status: Home / Self Care | Payer: Medicaid Other | Attending: Internal Medicine | Admitting: Internal Medicine

## 2018-08-27 ENCOUNTER — Other Ambulatory Visit: Payer: Self-pay

## 2018-08-27 DIAGNOSIS — D5700 Hb-SS disease with crisis, unspecified: Secondary | ICD-10-CM | POA: Diagnosis not present

## 2018-08-27 DIAGNOSIS — I119 Hypertensive heart disease without heart failure: Secondary | ICD-10-CM | POA: Diagnosis present

## 2018-08-27 DIAGNOSIS — J45909 Unspecified asthma, uncomplicated: Secondary | ICD-10-CM | POA: Diagnosis not present

## 2018-08-27 DIAGNOSIS — R1084 Generalized abdominal pain: Secondary | ICD-10-CM

## 2018-08-27 DIAGNOSIS — Z832 Family history of diseases of the blood and blood-forming organs and certain disorders involving the immune mechanism: Secondary | ICD-10-CM

## 2018-08-27 DIAGNOSIS — Y95 Nosocomial condition: Secondary | ICD-10-CM | POA: Diagnosis present

## 2018-08-27 DIAGNOSIS — D5701 Hb-SS disease with acute chest syndrome: Principal | ICD-10-CM | POA: Diagnosis present

## 2018-08-27 DIAGNOSIS — I10 Essential (primary) hypertension: Secondary | ICD-10-CM | POA: Diagnosis not present

## 2018-08-27 DIAGNOSIS — J154 Pneumonia due to other streptococci: Secondary | ICD-10-CM | POA: Diagnosis present

## 2018-08-27 DIAGNOSIS — R935 Abnormal findings on diagnostic imaging of other abdominal regions, including retroperitoneum: Secondary | ICD-10-CM

## 2018-08-27 DIAGNOSIS — N08 Glomerular disorders in diseases classified elsewhere: Secondary | ICD-10-CM | POA: Diagnosis present

## 2018-08-27 DIAGNOSIS — J189 Pneumonia, unspecified organism: Secondary | ICD-10-CM | POA: Diagnosis not present

## 2018-08-27 DIAGNOSIS — G8929 Other chronic pain: Secondary | ICD-10-CM | POA: Diagnosis present

## 2018-08-27 DIAGNOSIS — R402362 Coma scale, best motor response, obeys commands, at arrival to emergency department: Secondary | ICD-10-CM | POA: Diagnosis not present

## 2018-08-27 DIAGNOSIS — D735 Infarction of spleen: Secondary | ICD-10-CM | POA: Diagnosis present

## 2018-08-27 DIAGNOSIS — R402252 Coma scale, best verbal response, oriented, at arrival to emergency department: Secondary | ICD-10-CM | POA: Diagnosis not present

## 2018-08-27 DIAGNOSIS — K7689 Other specified diseases of liver: Secondary | ICD-10-CM | POA: Diagnosis not present

## 2018-08-27 DIAGNOSIS — Z8349 Family history of other endocrine, nutritional and metabolic diseases: Secondary | ICD-10-CM | POA: Diagnosis not present

## 2018-08-27 DIAGNOSIS — R0902 Hypoxemia: Secondary | ICD-10-CM | POA: Diagnosis not present

## 2018-08-27 DIAGNOSIS — R402142 Coma scale, eyes open, spontaneous, at arrival to emergency department: Secondary | ICD-10-CM | POA: Diagnosis not present

## 2018-08-27 DIAGNOSIS — J4520 Mild intermittent asthma, uncomplicated: Secondary | ICD-10-CM | POA: Diagnosis not present

## 2018-08-27 DIAGNOSIS — R079 Chest pain, unspecified: Secondary | ICD-10-CM | POA: Diagnosis not present

## 2018-08-27 DIAGNOSIS — Z8249 Family history of ischemic heart disease and other diseases of the circulatory system: Secondary | ICD-10-CM

## 2018-08-27 HISTORY — DX: Pneumonia, unspecified organism: J18.9

## 2018-08-27 LAB — COMPREHENSIVE METABOLIC PANEL
ALT: 21 U/L (ref 0–44)
AST: 39 U/L (ref 15–41)
Albumin: 4.2 g/dL (ref 3.5–5.0)
Alkaline Phosphatase: 76 U/L (ref 38–126)
Anion gap: 12 (ref 5–15)
BUN: 7 mg/dL (ref 6–20)
CO2: 26 mmol/L (ref 22–32)
Calcium: 9.3 mg/dL (ref 8.9–10.3)
Chloride: 100 mmol/L (ref 98–111)
Creatinine, Ser: 0.65 mg/dL (ref 0.61–1.24)
GFR calc Af Amer: >60 mL/min (ref >60)
GFR calc non Af Amer: >60 mL/min (ref >60)
Glucose, Bld: 106 mg/dL — ABNORMAL HIGH (ref 70–99)
Potassium: 4 mmol/L (ref 3.5–5.1)
Sodium: 138 mmol/L (ref 135–145)
Total Bilirubin: 4.4 mg/dL — ABNORMAL HIGH (ref 0.3–1.2)
Total Protein: 7.5 g/dL (ref 6.5–8.1)

## 2018-08-27 LAB — CBC WITH DIFFERENTIAL/PLATELET
Basophils Absolute: 0 10*3/uL (ref 0.0–0.1)
Basophils Relative: 0 %
Eosinophils Absolute: 0 10*3/uL (ref 0.0–0.7)
Eosinophils Relative: 0 %
HCT: 30.8 % — ABNORMAL LOW (ref 39.0–52.0)
Hemoglobin: 11.1 g/dL — ABNORMAL LOW (ref 13.0–17.0)
Lymphocytes Relative: 14 %
Lymphs Abs: 2.4 10*3/uL (ref 0.7–4.0)
MCH: 31 pg (ref 26.0–34.0)
MCHC: 36 g/dL (ref 30.0–36.0)
MCV: 86 fL (ref 78.0–100.0)
Monocytes Absolute: 3.4 10*3/uL — ABNORMAL HIGH (ref 0.1–1.0)
Monocytes Relative: 20 %
Neutro Abs: 11.6 10*3/uL — ABNORMAL HIGH (ref 1.7–7.7)
Neutrophils Relative %: 66 %
Platelets: 434 10*3/uL — ABNORMAL HIGH (ref 150–400)
RBC: 3.58 MIL/uL — ABNORMAL LOW (ref 4.22–5.81)
RDW: 19 % — ABNORMAL HIGH (ref 11.5–15.5)
WBC: 17.5 10*3/uL — ABNORMAL HIGH (ref 4.0–10.5)

## 2018-08-27 LAB — LIPASE, BLOOD: Lipase: 28 U/L (ref 11–51)

## 2018-08-27 LAB — RETICULOCYTES
RBC.: 3.58 MIL/uL — ABNORMAL LOW (ref 4.22–5.81)
Retic Count, Absolute: 286.4 10*3/uL — ABNORMAL HIGH (ref 19.0–186.0)
Retic Ct Pct: 8 % — ABNORMAL HIGH (ref 0.4–3.1)

## 2018-08-27 LAB — TROPONIN I: Troponin I: <0.03 ng/mL (ref <0.03)

## 2018-08-27 LAB — PHOSPHORUS: Phosphorus: 4.1 mg/dL (ref 2.5–4.6)

## 2018-08-27 LAB — MAGNESIUM: Magnesium: 2 mg/dL (ref 1.7–2.4)

## 2018-08-27 LAB — PATHOLOGIST SMEAR REVIEW

## 2018-08-27 MED ORDER — ENOXAPARIN SODIUM 40 MG/0.4ML ~~LOC~~ SOLN: 40.0000 mg | SUBCUTANEOUS | Status: DC

## 2018-08-27 MED ORDER — HYDROMORPHONE HCL 2 MG/ML IJ SOLN
2.0000 mg | INTRAMUSCULAR | Status: AC
  Filled 2018-08-27: qty 1

## 2018-08-27 MED ORDER — SODIUM CHLORIDE 0.9 % IV BOLUS
1000.0000 mL | Freq: Once | INTRAVENOUS | Status: AC
  Administered 2018-08-27: 1000 mL via INTRAVENOUS

## 2018-08-27 MED ORDER — HYDROMORPHONE HCL 2 MG/ML IJ SOLN
2.0000 mg | INTRAMUSCULAR | Status: AC
  Administered 2018-08-27: 2 mg via INTRAVENOUS
  Filled 2018-08-27: qty 1

## 2018-08-27 MED ORDER — SODIUM CHLORIDE 0.9 % IV SOLN
25.0000 mg | INTRAVENOUS | Status: DC | PRN
  Filled 2018-08-27: qty 0.5

## 2018-08-27 MED ORDER — ONDANSETRON HCL 4 MG/2ML IJ SOLN
4.0000 mg | INTRAMUSCULAR | Status: DC | PRN
  Administered 2018-08-27: 4 mg via INTRAVENOUS
  Filled 2018-08-27 (×2): qty 2

## 2018-08-27 MED ORDER — SODIUM CHLORIDE 0.9 % IV SOLN
1.0000 g | Freq: Three times a day (TID) | INTRAVENOUS | Status: DC
  Administered 2018-08-28 – 2018-08-30 (×7): 1 g via INTRAVENOUS
  Filled 2018-08-27 (×9): qty 1

## 2018-08-27 MED ORDER — SENNOSIDES-DOCUSATE SODIUM 8.6-50 MG PO TABS
1.0000 | ORAL_TABLET | Freq: Two times a day (BID) | ORAL | Status: DC
  Administered 2018-08-28 – 2018-08-30 (×6): 1 via ORAL
  Filled 2018-08-27 (×6): qty 1

## 2018-08-27 MED ORDER — VANCOMYCIN HCL IN DEXTROSE 1-5 GM/200ML-% IV SOLN
1000.0000 mg | Freq: Once | INTRAVENOUS | Status: AC
  Administered 2018-08-28: 1000 mg via INTRAVENOUS
  Filled 2018-08-27: qty 200

## 2018-08-27 MED ORDER — ENOXAPARIN SODIUM 40 MG/0.4ML ~~LOC~~ SOLN
40.0000 mg | Freq: Every day | SUBCUTANEOUS | Status: DC
  Filled 2018-08-27 (×2): qty 0.4

## 2018-08-27 MED ORDER — LISINOPRIL 10 MG PO TABS
10.0000 mg | ORAL_TABLET | Freq: Every day | ORAL | Status: DC
  Administered 2018-08-28 – 2018-08-30 (×3): 10 mg via ORAL
  Filled 2018-08-27 (×3): qty 1

## 2018-08-27 MED ORDER — DIPHENHYDRAMINE HCL 50 MG/ML IJ SOLN
25.0000 mg | Freq: Once | INTRAMUSCULAR | Status: AC
  Administered 2018-08-27: 25 mg via INTRAVENOUS
  Filled 2018-08-27: qty 1

## 2018-08-27 MED ORDER — HYDROMORPHONE 1 MG/ML IV SOLN
INTRAVENOUS | Status: DC
  Administered 2018-08-28: via INTRAVENOUS
  Administered 2018-08-28: 1.8 mg via INTRAVENOUS
  Administered 2018-08-28: 2.1 mg via INTRAVENOUS
  Administered 2018-08-29: 1.5 mg via INTRAVENOUS
  Administered 2018-08-29: 0.9 mg via INTRAVENOUS
  Administered 2018-08-29: 2.1 mg via INTRAVENOUS
  Administered 2018-08-29: 0.6 mg via INTRAVENOUS
  Administered 2018-08-29: 0 mg via INTRAVENOUS
  Administered 2018-08-30: 0.6 mg via INTRAVENOUS
  Administered 2018-08-30: 0.3 mg via INTRAVENOUS
  Filled 2018-08-27 (×3): qty 25

## 2018-08-27 MED ORDER — HYDROMORPHONE HCL 2 MG/ML IJ SOLN: 2.0000 mg | INTRAMUSCULAR | Status: AC

## 2018-08-27 MED ORDER — ACETAMINOPHEN 650 MG RE SUPP: 650.0000 mg | Freq: Four times a day (QID) | RECTAL | Status: DC | PRN

## 2018-08-27 MED ORDER — SODIUM CHLORIDE 0.9 % IV SOLN
2.0000 g | Freq: Once | INTRAVENOUS | Status: AC
  Administered 2018-08-27: 2 g via INTRAVENOUS
  Filled 2018-08-27 (×2): qty 2

## 2018-08-27 MED ORDER — HYDROXYUREA 500 MG PO CAPS
1500.0000 mg | ORAL_CAPSULE | Freq: Every day | ORAL | Status: DC
  Administered 2018-08-28 – 2018-08-30 (×3): 1500 mg via ORAL
  Filled 2018-08-27 (×3): qty 3

## 2018-08-27 MED ORDER — DIPHENHYDRAMINE HCL 25 MG PO CAPS: 25.0000 mg | ORAL_CAPSULE | ORAL | Status: DC | PRN

## 2018-08-27 MED ORDER — POLYETHYLENE GLYCOL 3350 17 G PO PACK
17.0000 g | PACK | Freq: Every day | ORAL | Status: DC | PRN
  Administered 2018-08-30: 17 g via ORAL
  Filled 2018-08-27: qty 1

## 2018-08-27 MED ORDER — HYDROMORPHONE HCL 2 MG/ML IJ SOLN
2.0000 mg | INTRAMUSCULAR | Status: AC
  Administered 2018-08-27: 2 mg via INTRAVENOUS

## 2018-08-27 MED ORDER — IOPAMIDOL (ISOVUE-300) INJECTION 61%
100.0000 mL | Freq: Once | INTRAVENOUS | Status: AC | PRN
  Administered 2018-08-27: 100 mL via INTRAVENOUS

## 2018-08-27 MED ORDER — SODIUM CHLORIDE 0.45 % IV BOLUS: 1000.0000 mL | Freq: Once | INTRAVENOUS | Status: DC

## 2018-08-27 MED ORDER — IOPAMIDOL (ISOVUE-300) INJECTION 61%
INTRAVENOUS | Status: AC
  Filled 2018-08-27: qty 100

## 2018-08-27 MED ORDER — ACETAMINOPHEN 325 MG PO TABS: 650.0000 mg | ORAL_TABLET | Freq: Four times a day (QID) | ORAL | Status: DC | PRN

## 2018-08-27 MED ORDER — KETOROLAC TROMETHAMINE 30 MG/ML IJ SOLN
30.0000 mg | Freq: Four times a day (QID) | INTRAMUSCULAR | Status: DC
  Administered 2018-08-28 – 2018-08-30 (×10): 30 mg via INTRAVENOUS
  Filled 2018-08-27 (×11): qty 1

## 2018-08-27 MED ORDER — SODIUM CHLORIDE 0.45 % IV SOLN
INTRAVENOUS | Status: DC
  Administered 2018-08-27 – 2018-08-29 (×2): via INTRAVENOUS

## 2018-08-27 MED ORDER — KETOROLAC TROMETHAMINE 30 MG/ML IJ SOLN: 30.0000 mg | Freq: Once | INTRAMUSCULAR | Status: DC

## 2018-08-27 MED ORDER — HYDROMORPHONE HCL 1 MG/ML IJ SOLN
1.0000 mg | Freq: Once | INTRAMUSCULAR | Status: AC
  Administered 2018-08-28: 1 mg via INTRAVENOUS
  Filled 2018-08-27: qty 1

## 2018-08-27 MED ORDER — SODIUM CHLORIDE 0.9% FLUSH: 9.0000 mL | INTRAVENOUS | Status: DC | PRN

## 2018-08-27 MED ORDER — ACETAMINOPHEN 325 MG PO TABS
650.0000 mg | ORAL_TABLET | Freq: Once | ORAL | Status: AC
  Administered 2018-08-27: 650 mg via ORAL
  Filled 2018-08-27: qty 2

## 2018-08-27 MED ORDER — NALOXONE HCL 0.4 MG/ML IJ SOLN: 0.4000 mg | INTRAMUSCULAR | Status: DC | PRN

## 2018-08-27 NOTE — Progress Notes (Signed)
Pharmacy Antibiotic Note  [name] Hill is a 17 y.o. male c/o SOB and sickle cell pain admitted on 08/27/2018 with pneumonia.  Pharmacy has been consulted for vancomycin dosing.  Plan: Vancomycin 1 gm x1 then 1500 mg IV q12h for est AUC = 458 Goal AUC = 400-500 Cefepime 1 Gm IV q8h (MD) F/u scr/cultures/levels     Temp (24hrs), Avg:100 F (37.8 C), Min:100 F (37.8 C), Max:100 F (37.8 C)  Recent Labs  Lab 08/25/18 2209 08/26/18 1306 08/27/18 1921  WBC 13.6* 15.1* 17.5*  CREATININE 0.69 0.67 0.65    Estimated Creatinine Clearance: 148.3 mL/min (by C-G formula based on SCr of 0.65 mg/dL).    Allergies  Allergen Reactions  . Other Cough    Seasonal allergies.    Antimicrobials this admission: 10/3 cefepime >>  10/3 vancomycin >>   Dose adjustments this admission:   Microbiology results:  BCx:   UCx:    Sputum:    MRSA PCR:   Thank you for allowing pharmacy to be a part of this patient's care.  Lorenza Evangelist 08/27/2018 10:54 PM

## 2018-08-27 NOTE — Discharge Summary (Signed)
[facility] Discharge Summary   Patient ID: [name] Hill MRN: [id number] DOB/AGE: Oct 25, 2001 17 y.o.  Admit date: 08/26/2018 Discharge date: 08/27/2018  Primary Care Physician:  Kallie Locks, FNP  Admission Diagnoses:  Active Problems:   Hb-SS disease with vaso-occlusive crisis [facility])   Discharge Medications:  Allergies as of 08/26/2018      Reactions   Other Cough   Seasonal allergies.      Medication List    TAKE these medications   acetaminophen 650 MG CR tablet Commonly known as:  TYLENOL Take 1,300 mg by mouth every 8 (eight) hours as needed for pain.   albuterol 108 (90 Base) MCG/ACT inhaler Commonly known as:  PROVENTIL HFA;VENTOLIN HFA Inhale 2 puffs into the lungs every 4 (four) hours as needed for wheezing or shortness of breath.   hydroxyurea 400 MG capsule Commonly known as:  DROXIA Take 4 capsules (1,600 mg total) by mouth daily.   lisinopril 10 MG tablet Commonly known as:  PRINIVIL,ZESTRIL Take 1 tablet (10 mg total) by mouth daily.   loperamide 2 MG capsule Commonly known as:  IMODIUM Take 1 capsule (2 mg total) 4 (four) times daily as needed by mouth for diarrhea or loose stools.   oxyCODONE 5 MG immediate release tablet Commonly known as:  Oxy IR/ROXICODONE Take 1 tablet (5 mg total) by mouth every 6 (six) hours as needed for severe pain.   sulfamethoxazole-trimethoprim 800-160 MG tablet Commonly known as:  BACTRIM DS,SEPTRA DS Take 1 tablet by mouth 2 (two) times daily.        Consults:  None  Significant Diagnostic Studies:  Ct Angio Chest Pe W Or Wo Contrast  Result Date: 08/26/2018 CLINICAL DATA:  Chest and back pain in sickle cell crisis EXAM: CT ANGIOGRAPHY CHEST WITH CONTRAST TECHNIQUE: Multidetector CT imaging of the chest was performed using the standard protocol during bolus administration of intravenous contrast. Multiplanar CT image reconstructions and MIPs were obtained to evaluate the vascular anatomy.  CONTRAST:  80mL ISOVUE-370 IOPAMIDOL (ISOVUE-370) INJECTION 76% COMPARISON:  CTA chest dated 05/16/2018 FINDINGS: Cardiovascular: Satisfactory opacification the bilateral pulmonary arteries to the lobar level. No evidence of pulmonary embolism. Enlargement of the main pulmonary artery. No evidence of thoracic aortic aneurysm or dissection. The heart is normal in size.  No pericardial effusion. Mediastinum/Nodes: No suspicious mediastinal lymphadenopathy. Visualized thyroid is unremarkable. Lungs/Pleura: Evaluation of the lung parenchyma is constrained by respiratory motion. No suspicious pulmonary nodules. Mild mosaic attenuation. No focal consolidation. No pleural effusion or pneumothorax. Upper Abdomen: Visualized upper abdomen is notable for a hyperdense spleen, related to sickle cell disease. Musculoskeletal: H-shaped lower thoracic vertebral bodies, with endplate changes related to sickle cell disease. Review of the MIP images confirms the above findings. IMPRESSION: No evidence of pulmonary embolism. No evidence of acute cardiopulmonary disease. Electronically Signed   By: Charline Bills M.D.   On: 08/26/2018 01:23     [facility] Course:  [name] Hill, an 17 year old male with a history of sickle cell anemia and chronic pain admitted in sickle cell crisis. Patient was evaluated in the ER on 08/25/2018. Patient says that pain returned on last night.  Patient attributes pain crisis to increased stress.   Labs reviewed, hemodynamically stable.  Hypotonic IV fluids for cellular rehydration Dilaudid PCA initiated for pain control. Patient only used a total of 4 mg.  Oxycodone 10 mg times one Pain decreased to 4/10. Patient alert, oriented, and ambulating. He states that he  can manage at home on current medication regimen.  Patient will discharge home with family in stable condition.   Discharge instruction:  Resume all home medications Continue to hydrate with 64 ounces of  water   The patient was given clear instructions to go to ER or return to medical center if symptoms do not improve, worsen or new problems develop. The patient verbalized understanding.   Avoid stressors that precipitate sickle cell crisis.  Physical Exam at Discharge:  BP 130/77 (BP Location: Left Arm)   Pulse 67   Temp 98.4 F (36.9 C) (Oral)   Resp 18   SpO2 98%  Physical Exam  Constitutional: He is oriented to person, place, and time. He appears well-developed and well-nourished.  Eyes: Pupils are equal, round, and reactive to light.  Cardiovascular: Normal rate, regular rhythm, normal heart sounds and intact distal pulses.  Pulmonary/Chest: Effort normal and breath sounds normal.  Abdominal: Soft. Bowel sounds are normal.  Neurological: He is alert and oriented to person, place, and time.  Skin: Skin is warm and dry.  Psychiatric: He has a normal mood and affect. His behavior is normal. Judgment and thought content normal.    Disposition at Discharge: Discharge disposition: 01-Home or Self Care       Discharge Orders: Discharge Instructions    Discharge patient   Complete by:  As directed    Discharge disposition:  01-Home or Self Care   Discharge patient date:  08/26/2018      Condition at Discharge:   Stable  Time spent on Discharge:  Greater than 30 minutes.  Signed: Nolon Nations  APRN, MSN, FNP-C Patient Care [facility] [address] [place], [place] [address] [phone]  08/27/2018, 10:53 AM

## 2018-08-27 NOTE — H&P (Signed)
History and Physical    [name] Hill:[id number] DOB: 07-Oct-2001 DOA: 08/27/2018  PCP: [name] Locks, FNP   Patient coming from: Home.  I have personally briefly reviewed patient's old medical records in [facility]  Chief Complaint: Chest and back pain.  HPI: [name] Hill is a 17 y.o. male with medical history significant of sickle cell anemia, history of multiple episodes of acute chest syndrome, airway hyperactivity (diagnosed as asthma), blurred vision, sickle cell nephropathy who was seen at this facility on 08/25/2018, discharged home with follow-up with a sickle cell team and now is returning to the emergency department due to sickle cell crisis to the ED.  Per patient, since he was discharged the pain has recurred.  He went to the sickle cell clinic yesterday, was seen and discharged. However, he mentions that yesterday evening he had subjective fever and had night sweats last night.  Today, the patient's symptoms have recurred so he return to the ED.  He denies travel history or sick contacts.  He denies rhinorrhea, sore throat, cough, hemoptysis, palpitations, dizziness or diaphoresis.  No PND, orthopnea or pitting edema of the lower extremities.  He has had some RUQ tenderness amild epigastric discomfort associated with nausea and had an episode of emesis earlier today.  He denies diarrhea, constipation, melena or hematochezia.  No dysuria, frequency or hematuria.  He denies skin rashes or pruritus.  No heat or cold intolerance.  No polyuria, polydipsia or polyphagia.  ED Course: Initial vital signs temperature 100 F, pulse 103, respirations 18, blood pressure 156/89 mmHg and O2 sat 86% on room air.  His white count was 17.5, hemoglobin 11.1 g/dL and platelets 811.  CMP showed a glucose of 106 and total bilirubin of 4.4 mg/dL, all other values are within normal limits.  His chest radiograph shows stable cardiomegaly with no acute process.  He had a normal RUQ  ultrasound.  CT chest showed linear subpleural airspace consolidation versus atelectasis in the lower lung base.  He had a small spleen containing multiple circumscribed, hypoattenuated lesions.  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  H shaped vertebral bodies, consistent with sickle cell changes.  Please see images and full radiology report for further detail.  Review of Systems: As per HPI otherwise 10 point review of systems negative.   Past Medical History:  Diagnosis Date  . 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 Blurred vision   . Sickle cell anemia (HCC)   . Sickle cell crisis (HCC)   . Sickle cell nephropathy (HCC)     Past Surgical History:  Procedure Laterality Date  . CIRCUMCISION    . TONSILLECTOMY    . TONSILLECTOMY AND ADENOIDECTOMY       reports that he has never smoked. He has never used smokeless tobacco. He reports that he does not drink alcohol or use drugs.  Allergies  Allergen Reactions  . Other Cough    Seasonal allergies.    Family History  Problem Relation Age of Onset  . Sickle cell anemia Brother   . Hypertension Maternal Grandmother   . Hyperlipidemia Maternal Grandmother   . Anemia Mother    Prior to Admission medications   Medication Sig Start Date End Date Taking? Authorizing Provider  acetaminophen (TYLENOL) 650 MG CR tablet Take 1,300 mg by mouth every 8 (eight) hours as needed for pain.   Yes [provider]  hydroxyurea (  DROXIA) 400 MG capsule Take 4 capsules (1,600 mg total) by mouth daily. 10/05/16  Yes [name], MD  lisinopril (PRINIVIL,ZESTRIL) 10 MG tablet Take 1 tablet (10 mg total) by mouth daily. 10/05/16  Yes [name], MD  oxyCODONE (OXY IR/ROXICODONE) 5 MG immediate release tablet Take 1 tablet (5 mg total) by mouth every 6 (six) hours as needed for severe pain. 08/26/18  Yes [name] Locks, FNP  albuterol (PROVENTIL HFA;VENTOLIN  HFA) 108 (90 Base) MCG/ACT inhaler Inhale 2 puffs into the lungs every 4 (four) hours as needed for wheezing or shortness of breath. Patient not taking: Reported on 08/27/2018 10/05/16   [name], MD  loperamide (IMODIUM) 2 MG capsule Take 1 capsule (2 mg total) 4 (four) times daily as needed by mouth for diarrhea or loose stools. Patient not taking: Reported on 08/27/2018 10/05/17   Audry Pili, PA-C  sulfamethoxazole-trimethoprim (BACTRIM DS,SEPTRA DS) 800-160 MG tablet Take 1 tablet by mouth 2 (two) times daily. Patient not taking: Reported on 08/26/2018 06/26/18   [name] Locks, FNP    Physical Exam: Vitals:   08/27/18 1917 08/27/18 1930 08/27/18 2029 08/27/18 2125  BP: (!) 147/76 (!) 141/80 135/74 127/71  Pulse: 86 83 80 91  Resp: (!) 24 (!) 22 (!) 26 17  Temp:      TempSrc:      SpO2: 94% 96% 96% 95%    Constitutional: Looks acutely ill. Eyes: PERRL, lids and conjunctivae mildly pale ENMT: Mucous membranes and lips are dry. Posterior pharynx clear of any exudate or lesions. Neck: normal, supple, no masses, no thyromegaly Respiratory: Decreased breath sounds on bases, no wheezing, no crackles. Normal respiratory effort. No accessory muscle use.  Cardiovascular: Regular rate and rhythm, no murmurs / rubs / gallops. No extremity edema. 2+ pedal pulses. No carotid bruits.  Abdomen: Soft, no tenderness, no masses palpated. No hepatosplenomegaly. Bowel sounds positive.  Musculoskeletal: no clubbing / cyanosis. Good ROM, no contractures. Normal muscle tone.  Skin: no rashes, lesions, ulcers. No induration on limited dermatological examination. Neurologic: CN 2-12 grossly intact. Sensation intact, DTR normal. Strength 5/5 in all 4.  Psychiatric: Normal judgment and insight. Alert and oriented x 3. Normal mood.   Labs on Admission: I have personally reviewed following labs and imaging studies  CBC: Recent Labs  Lab 08/25/18 2209 08/26/18 1306 08/27/18 1921  WBC 13.6* 15.1*  17.5*  NEUTROABS 5.3 10.5* 11.6*  HGB 10.4* 10.1* 11.1*  HCT 29.0* 27.7* 30.8*  MCV 87.1 85.2 86.0  PLT 451* 428* 434*   Basic Metabolic Panel: Recent Labs  Lab 08/25/18 2209 08/26/18 1306 08/27/18 1921  NA 136 141 138  K 4.5 4.2 4.0  CL 104 107 100  CO2 23 26 26   GLUCOSE 99 98 106*  BUN 9 8 7   CREATININE 0.69 0.67 0.65  CALCIUM 9.2 9.3 9.3   GFR: Estimated Creatinine Clearance: 148.3 mL/min (by C-G formula based on SCr of 0.65 mg/dL). Liver Function Tests: Recent Labs  Lab 08/25/18 2209 08/26/18 1306 08/27/18 1921  AST 51* 46* 39  ALT 25 26 21   ALKPHOS 87 74 76  BILITOT 3.1* 3.1* 4.4*  PROT 6.8 7.2 7.5  ALBUMIN 4.2 4.3 4.2   Recent Labs  Lab 08/27/18 1923  LIPASE 28   No results for input(s): AMMONIA in the last 168 hours. Coagulation Profile: No results for input(s): INR, PROTIME in the last 168 hours. Cardiac Enzymes: Recent Labs  Lab 08/27/18 1921  TROPONINI <0.03   BNP (last  3 results) No results for input(s): PROBNP in the last 8760 hours. HbA1C: No results for input(s): HGBA1C in the last 72 hours. CBG: No results for input(s): GLUCAP in the last 168 hours. Lipid Profile: No results for input(s): CHOL, HDL, LDLCALC, TRIG, CHOLHDL, LDLDIRECT in the last 72 hours. Thyroid Function Tests: No results for input(s): TSH, T4TOTAL, FREET4, T3FREE, THYROIDAB in the last 72 hours. Anemia Panel: Recent Labs    08/25/18 2209 08/27/18 1921  RETICCTPCT 6.5* 8.0*   Urine analysis:    Component Value Date/Time   COLORURINE YELLOW 08/26/2018 1306   APPEARANCEUR CLEAR 08/26/2018 1306   LABSPEC 1.006 08/26/2018 1306   PHURINE 6.0 08/26/2018 1306   GLUCOSEU NEGATIVE 08/26/2018 1306   HGBUR SMALL (A) 08/26/2018 1306   BILIRUBINUR NEGATIVE 08/26/2018 1306   BILIRUBINUR negative 06/26/2018 1335   KETONESUR NEGATIVE 08/26/2018 1306   PROTEINUR 30 (A) 08/26/2018 1306   UROBILINOGEN 2.0 (A) 06/26/2018 1335   UROBILINOGEN 2.0 (H) 07/07/2015 0738    NITRITE NEGATIVE 08/26/2018 1306   LEUKOCYTESUR NEGATIVE 08/26/2018 1306    Radiological Exams on Admission: Dg Chest 2 View  Result Date: 08/27/2018 CLINICAL DATA:  Sickle cell crisis, chest and back pain EXAM: CHEST - 2 VIEW COMPARISON:  05/16/2018 FINDINGS: Stable cardiomegaly and central vascular prominence. No focal pneumonia, collapse or consolidation. Negative for edema, effusion or pneumothorax. Trachea midline. Chronic osseous changes of sickle cell disease. IMPRESSION: Stable cardiomegaly without acute process. Electronically Signed   By: Judie Petit.  Shick M.D.   On: 08/27/2018 18:10   Ct Angio Chest Pe W Or Wo Contrast  Result Date: 08/26/2018 CLINICAL DATA:  Chest and back pain in sickle cell crisis EXAM: CT ANGIOGRAPHY CHEST WITH CONTRAST TECHNIQUE: Multidetector CT imaging of the chest was performed using the standard protocol during bolus administration of intravenous contrast. Multiplanar CT image reconstructions and MIPs were obtained to evaluate the vascular anatomy. CONTRAST:  80mL ISOVUE-370 IOPAMIDOL (ISOVUE-370) INJECTION 76% COMPARISON:  CTA chest dated 05/16/2018 FINDINGS: Cardiovascular: Satisfactory opacification the bilateral pulmonary arteries to the lobar level. No evidence of pulmonary embolism. Enlargement of the main pulmonary artery. No evidence of thoracic aortic aneurysm or dissection. The heart is normal in size.  No pericardial effusion. Mediastinum/Nodes: No suspicious mediastinal lymphadenopathy. Visualized thyroid is unremarkable. Lungs/Pleura: Evaluation of the lung parenchyma is constrained by respiratory motion. No suspicious pulmonary nodules. Mild mosaic attenuation. No focal consolidation. No pleural effusion or pneumothorax. Upper Abdomen: Visualized upper abdomen is notable for a hyperdense spleen, related to sickle cell disease. Musculoskeletal: H-shaped lower thoracic vertebral bodies, with endplate changes related to sickle cell disease. Review of the MIP images  confirms the above findings. IMPRESSION: No evidence of pulmonary embolism. No evidence of acute cardiopulmonary disease. Electronically Signed   By: Charline Bills M.D.   On: 08/26/2018 01:23   Ct Abdomen Pelvis W Contrast  Result Date: 08/27/2018 CLINICAL DATA:  Sickle cell pain in chest and back for 2 days. EXAM: CT ABDOMEN AND PELVIS WITH CONTRAST TECHNIQUE: Multidetector CT imaging of the abdomen and pelvis was performed using the standard protocol following bolus administration of intravenous contrast. CONTRAST:  ISOVUE-300 IOPAMIDOL (ISOVUE-300) INJECTION 61% COMPARISON:  Right upper quadrant abdominal ultrasound dated 08/27/2018 FINDINGS: Lower chest: Partially visualized enlarged heart. Linear subpleural airspace consolidation versus atelectasis. Hepatobiliary: No focal liver abnormality is seen. No gallstones, gallbladder wall thickening, or biliary dilatation. Pancreas: Unremarkable. No pancreatic ductal dilatation or surrounding inflammatory changes. Spleen: Small spleen containing multiple circumscribed hypoattenuated lesions. Adrenals/Urinary Tract: Adrenal  glands are unremarkable. Kidneys are normal, without renal calculi, focal lesion, or hydronephrosis. Bladder is unremarkable. Stomach/Bowel: Stomach is within normal limits. Appendix appears normal. No evidence of bowel wall thickening, distention, or inflammatory changes. Vascular/Lymphatic: No significant vascular findings are present. No enlarged abdominal or pelvic lymph nodes. Reproductive: Prostate is unremarkable. Other: No abdominal wall hernia or abnormality. No abdominopelvic ascites. Musculoskeletal: H-shaped vertebral bodies, consistent with the given history of sickle cell disease. IMPRESSION: Partially visualized enlarged heart. Linear subpleural airspace consolidation versus atelectasis in bilateral lung bases. Small spleen containing multiple circumscribed hypoattenuated lesions. Given patient's history these may  represent splenic infarcts. Alternatively splenic abscesses may have a similar appearance. H-shaped vertebral bodies, consistent with sickle cell disease changes. Electronically Signed   By: Ted Mcalpine M.D.   On: 08/27/2018 21:08   US Abdomen Limited Ruq  Result Date: 08/27/2018 CLINICAL DATA:  Generalized abdominal pain x2 days EXAM: ULTRASOUND ABDOMEN LIMITED RIGHT UPPER QUADRANT COMPARISON:  09/28/2016 FINDINGS: Gallbladder: No gallstones, gallbladder wall thickening, or pericholecystic fluid. Negative sonographic Murphy's sign. Common bile duct: Diameter: 4 mm Liver: No focal lesion identified. Within normal limits in parenchymal echogenicity. Portal vein is patent on color Doppler imaging with normal direction of blood flow towards the liver. IMPRESSION: Negative right upper quadrant ultrasound. Electronically Signed   By: Charline Bills M.D.   On: 08/27/2018 20:14    EKG: Independently reviewed. Vent. rate 96 BPM PR interval * ms QRS duration 93 ms QT/QTc 312/395 ms P-R-T axes 73 74 0 Sinus rhythm Borderline T abnormalities, diffuse leads Borderline ST elevation, lateral leads Baseline wander in lead(s) V3  Assessment/Plan Principal Problem:   Sickle cell crisis acute chest syndrome (HCC) Admit to telemetry/inpatient. Continue supplemental oxygen. Continue hypotonic IV fluids. Start hydromorphone PCA. Toradol 30 mg IVP every 6 hours as needed. Benadryl 25 mg IVP every 6 hours as needed. Follow-up H&H, reticulocyte count, bilirubin, etc.  Active Problems:   HCAP (healthcare-associated pneumonia) Continue supplemental oxygen. Bronchodilators as needed. Continue cefepime and vancomycin per pharmacy. Follow-up blood culture and sensitivity. Check strep pneumonia urinary antigen. Check sputum Gram stain, culture and sensitivity.    Asthma No wheezing at this time. As above. Continue supplemental oxygen. Bronchodilators as needed.    Hypertension Continue  lisinopril 10 mg p.o. daily. Monitor blood pressure renal function electrolytes.      DVT prophylaxis: Lovenox SQ. Code Status: Full code. Family Communication:  Disposition Plan: Admit for sickle cell chest pain crisis treatment. Consults called:  Admission status: Inpatient/telemetry.   Bobette Mo MD [facility] Pager [phone].  If 7PM-7AM, please contact night-coverage [email or web address] Password [facility]  08/27/2018, 11:06 PM   This document was prepared using Dragon voice recognition software and may contain some unintended transcription errors.

## 2018-08-27 NOTE — ED Triage Notes (Signed)
Pt complains of sickle cell pain in chest and back x 2 days. Pt went to sickle cell clinic yesterday but states pain became worse after leaving.

## 2018-08-27 NOTE — ED Provider Notes (Signed)
[facility]-EMERGENCY DEPT Provider Note   CSN: [id number] Arrival date & time: 08/27/18  1725     History   Chief Complaint Chief Complaint  Patient presents with  . Sickle Cell Pain Crisis    HPI [name] Hill is a 17 y.o. male with a history of sickle cell anemia who presents emergency department today for sickle cell pain crisis.  Patient reports that his sickle cell crisis began on 10/1.  [facility] to [facility] where he was evaluated for pain in his chest, upper back and lower back.  He received a CTA that did not show evidence of PE.  There is no evidence of acute chest.  His pain was controlled he was discharged home.  He reports that his pain returned and he went to the sickle cell clinic yesterday where his pain was getting controlled he was able be discharged.  He reports he is presenting today because his pain has now returned and his home oxycodone and Tylenol are not helping.  His last dose was this morning.  Patient reports his chest pain is diffuse but not exertional or pleuritic in nature. He reports some SOB.  He denies any trauma to the back.  No bowel/bladder incontinence, urine retention or saddle anesthesia.  No focal deficits.  Denies neck stiffness.  He does report generalized abdominal pain that is worse in the right side.  Is not sure if it is worse in the upper or lower portion of the abdomen.  He is associated with nausea as well as one episode of nonbilious, nonbloody emesis.  He denies any urinary symptoms or diarrhea.  Last bowel movement was yesterday and normal.  No melena or hematochezia.  He does report subjective fevers at home.  He denies any cough, hemoptysis, lower extremity swelling, flank pain or urinary symptoms.  HPI  Past Medical History:  Diagnosis Date  . 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 Blurred vision   . Sickle cell anemia (HCC)    . Sickle cell crisis (HCC)   . 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 Acute chest pain   . Acute chest syndrome (HCC) 05/16/2018  . Leukocytosis   . Transaminitis   . 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 Family circumstance 07/03/2016  . 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 Asthma 06/03/2012  . Abnormal presence of protein in urine 06/03/2012  . Sickle cell pain crisis (HCC) 04/21/2012    Past Surgical History:  Procedure Laterality Date  . CIRCUMCISION    . TONSILLECTOMY    . TONSILLECTOMY AND ADENOIDECTOMY          Home Medications    Prior to Admission medications   Medication Sig Start Date End Date Taking? Authorizing Provider  acetaminophen (TYLENOL) 650 MG CR tablet Take 1,300 mg by mouth every 8 (eight) hours as needed for pain.    [provider]  albuterol (PROVENTIL HFA;VENTOLIN HFA) 108 (90 Base) MCG/ACT inhaler Inhale 2 puffs into the lungs every 4 (four) hours as needed for wheezing or shortness of breath. 10/05/16   [name], MD  hydroxyurea (DROXIA) 400 MG capsule Take 4 capsules (1,600 mg total) by mouth daily. 10/05/16   [name], MD  lisinopril (PRINIVIL,ZESTRIL) 10 MG tablet Take 1 tablet (10 mg total) by mouth  daily. 10/05/16   [name], MD  loperamide (IMODIUM) 2 MG capsule Take 1 capsule (2 mg total) 4 (four) times daily as needed by mouth for diarrhea or loose stools. 10/05/17   Audry Pili, PA-C  oxyCODONE (OXY IR/ROXICODONE) 5 MG immediate release tablet Take 1 tablet (5 mg total) by mouth every 6 (six) hours as needed for severe pain. 08/26/18   Kallie Locks, FNP  sulfamethoxazole-trimethoprim (BACTRIM DS,SEPTRA DS) 800-160 MG tablet Take 1 tablet by mouth 2 (two) times daily. Patient not taking: Reported on 08/26/2018 06/26/18   Kallie Locks, FNP    Family History Family History  Problem Relation Age of Onset  . Sickle cell  anemia Brother   . Hypertension Maternal Grandmother   . Anemia Mother     Social History Social History   Tobacco Use  . Smoking status: Never Smoker  . Smokeless tobacco: Never Used  Substance Use Topics  . Alcohol use: No  . Drug use: No     Allergies   Other   Review of Systems Review of Systems  All other systems reviewed and are negative.    Physical Exam Updated Vital Signs BP (!) 156/89 (BP Location: Left Arm)   Pulse (!) 103   Temp 100 F (37.8 C) (Oral)   Resp 18   SpO2 (!) 86%   Physical Exam  Constitutional: He appears well-developed and well-nourished.  HENT:  Head: Normocephalic and atraumatic.  Right Ear: External ear normal.  Left Ear: External ear normal.  Nose: Nose normal.  Mouth/Throat: Uvula is midline, oropharynx is clear and moist and mucous membranes are normal. No tonsillar exudate.  The patient has normal phonation and is in control of secretions. No stridor.  Midline uvula without edema. Soft palate rises symmetrically. Tonsils, not visible. No PTA. Tongue protrusion is normal. No trismus. No creptius on neck palpation and patient has good dentition. No gingival erythema or fluctuance noted. Mucus membranes moist.   Eyes: Pupils are equal, round, and reactive to light. Right eye exhibits no discharge. Left eye exhibits no discharge. No scleral icterus.  Neck: Trachea normal. Neck supple. No spinous process tenderness present. No neck rigidity. Normal range of motion present.  No nuchal rigidity or meningismus  Cardiovascular: Regular rhythm and intact distal pulses. Tachycardia present.  No murmur heard. Pulses:      Radial pulses are 2+ on the right side, and 2+ on the left side.       Dorsalis pedis pulses are 2+ on the right side, and 2+ on the left side.       Posterior tibial pulses are 2+ on the right side, and 2+ on the left side.  No lower extremity swelling or edema. Calves symmetric in size bilaterally.  Pulmonary/Chest:  Effort normal and breath sounds normal. He exhibits tenderness.  Patient on O2  Abdominal: Soft. Bowel sounds are normal. He exhibits no distension. There is generalized tenderness. There is no rigidity, no rebound, no guarding and no CVA tenderness.  Musculoskeletal: He exhibits no edema.  Lymphadenopathy:    He has no cervical adenopathy.  Neurological: He is alert.  Speech clear. Follows commands. No facial droop. PERRLA. EOM grossly intact. CN III-XII grossly intact. Grossly moves all extremities 4 without ataxia. Able and appropriate strength for age to upper and lower extremities bilaterally.   Skin: Skin is warm and dry. Capillary refill takes less than 2 seconds. No rash noted. He is not diaphoretic.  Psychiatric: He has a normal  mood and affect.  Nursing note and vitals reviewed.    ED Treatments / Results  Labs (all labs ordered are listed, but only abnormal results are displayed) Labs Reviewed  COMPREHENSIVE METABOLIC PANEL - Abnormal; Notable for the following components:      Result Value   Glucose, Bld 106 (*)    Total Bilirubin 4.4 (*)    All other components within normal limits  CBC WITH DIFFERENTIAL/PLATELET - Abnormal; Notable for the following components:   WBC 17.5 (*)    RBC 3.58 (*)    Hemoglobin 11.1 (*)    HCT 30.8 (*)    RDW 19.0 (*)    Platelets 434 (*)    Neutro Abs 11.6 (*)    Monocytes Absolute 3.4 (*)    All other components within normal limits  RETICULOCYTES - Abnormal; Notable for the following components:   Retic Ct Pct 8.0 (*)    RBC. 3.58 (*)    Retic Count, Absolute 286.4 (*)    All other components within normal limits  TROPONIN I  LIPASE, BLOOD  URINALYSIS, ROUTINE W REFLEX MICROSCOPIC    EKG EKG Interpretation  Date/Time:  Thursday August 27 2018 17:44:30 EDT Ventricular Rate:  96 PR Interval:    QRS Duration: 93 QT Interval:  312 QTC Calculation: 395 R Axis:   74 Text Interpretation:  Sinus rhythm Borderline T  abnormalities, diffuse leads Borderline ST elevation, lateral leads Baseline wander in lead(s) V3 No significant change since last tracing Confirmed by Melene Plan [phone]) on 08/27/2018 7:47:24 PM   Radiology Dg Chest 2 View  Result Date: 08/27/2018 CLINICAL DATA:  Sickle cell crisis, chest and back pain EXAM: CHEST - 2 VIEW COMPARISON:  05/16/2018 FINDINGS: Stable cardiomegaly and central vascular prominence. No focal pneumonia, collapse or consolidation. Negative for edema, effusion or pneumothorax. Trachea midline. Chronic osseous changes of sickle cell disease. IMPRESSION: Stable cardiomegaly without acute process. Electronically Signed   By: Judie Petit.  Shick M.D.   On: 08/27/2018 18:10   Ct Angio Chest Pe W Or Wo Contrast  Result Date: 08/26/2018 CLINICAL DATA:  Chest and back pain in sickle cell crisis EXAM: CT ANGIOGRAPHY CHEST WITH CONTRAST TECHNIQUE: Multidetector CT imaging of the chest was performed using the standard protocol during bolus administration of intravenous contrast. Multiplanar CT image reconstructions and MIPs were obtained to evaluate the vascular anatomy. CONTRAST:  80mL ISOVUE-370 IOPAMIDOL (ISOVUE-370) INJECTION 76% COMPARISON:  CTA chest dated 05/16/2018 FINDINGS: Cardiovascular: Satisfactory opacification the bilateral pulmonary arteries to the lobar level. No evidence of pulmonary embolism. Enlargement of the main pulmonary artery. No evidence of thoracic aortic aneurysm or dissection. The heart is normal in size.  No pericardial effusion. Mediastinum/Nodes: No suspicious mediastinal lymphadenopathy. Visualized thyroid is unremarkable. Lungs/Pleura: Evaluation of the lung parenchyma is constrained by respiratory motion. No suspicious pulmonary nodules. Mild mosaic attenuation. No focal consolidation. No pleural effusion or pneumothorax. Upper Abdomen: Visualized upper abdomen is notable for a hyperdense spleen, related to sickle cell disease. Musculoskeletal: H-shaped lower thoracic  vertebral bodies, with endplate changes related to sickle cell disease. Review of the MIP images confirms the above findings. IMPRESSION: No evidence of pulmonary embolism. No evidence of acute cardiopulmonary disease. Electronically Signed   By: Charline Bills M.D.   On: 08/26/2018 01:23    Procedures Procedures (including critical care time) CRITICAL CARE Performed by: Jacinto Halim   Total critical care time: 45 minutes - acute chest syndrome, hypoxia.   Critical care time was exclusive of separately  billable procedures and treating other patients.  Critical care was necessary to treat or prevent imminent or life-threatening deterioration.  Critical care was time spent personally by me on the following activities: development of treatment plan with patient and/or surrogate as well as nursing, discussions with consultants, evaluation of patient's response to treatment, examination of patient, obtaining history from patient or surrogate, ordering and performing treatments and interventions, ordering and review of laboratory studies, ordering and review of radiographic studies, pulse oximetry and re-evaluation of patient's condition.   Medications Ordered in ED Medications  ondansetron (ZOFRAN) injection 4 mg (4 mg Intravenous Given 08/27/18 1909)  iopamidol (ISOVUE-300) 61 % injection (has no administration in time range)  HYDROmorphone (DILAUDID) injection 2 mg (2 mg Intravenous Given 08/27/18 1909)    Or  HYDROmorphone (DILAUDID) injection 2 mg ( Subcutaneous See Alternative 08/27/18 1909)  HYDROmorphone (DILAUDID) injection 2 mg (2 mg Intravenous Given 08/27/18 2114)    Or  HYDROmorphone (DILAUDID) injection 2 mg ( Subcutaneous See Alternative 08/27/18 2114)  HYDROmorphone (DILAUDID) injection 2 mg (2 mg Intravenous Given 08/27/18 2040)    Or  HYDROmorphone (DILAUDID) injection 2 mg ( Subcutaneous See Alternative 08/27/18 2040)  diphenhydrAMINE (BENADRYL) injection 25 mg (25 mg  Intravenous Given 08/27/18 1909)  sodium chloride 0.9 % bolus 1,000 mL (0 mLs Intravenous Stopped 08/27/18 2109)  acetaminophen (TYLENOL) tablet 650 mg (650 mg Oral Given 08/27/18 2040)  iopamidol (ISOVUE-300) 61 % injection 100 mL (100 mLs Intravenous Contrast Given 08/27/18 2052)    Initial Impression / Assessment and Plan / ED Course  I have reviewed the triage vital signs and the nursing notes.  Pertinent labs & imaging results that were available during my care of the patient were reviewed by me and considered in my medical decision making (see chart for details).     17 year old male with chest pain, shortness of breath, subjective fevers, chills as well as pain in his lower back secondary to his sickle cell.  Patient was seen here on the first of the month where he had a CTA that did not show evidence of PE.  He notes his pain is been ongoing and worsening since that time.  Work-up show signs of acute chest syndrome.  Patient covered with broad-spectrum antibiotics and blood cultures were obtained prior to antibiotic therapy.  Patient is reporting chest pain, shortness of breath, and had low-grade temperature of 100 with hypoxia in the department.  Patient's blood work with a leukocytosis.  CT reveals a consolidation in bilateral lung bases.  Work-up otherwise with evidence of likely splenic infarcts vs possibly splenic abscesses.  No evidence of appendicitis or cholecystitis.  LFTs are reassuring.  Lab work and imaging reviewed. Patients pain improved to a 5/10 with pain medication per the sickle cell order set. Patient given  1L bolus of fluid upon arrival that improved HR. Hypoxia improved after 2L by [place]. He was given Tylenol for his temperature.   Hospitalist, Dr. Robb Matar to admit the patient. He is aware of CT findings including consolidation and splenic infarct vs abscess. Patient appears safe for admission.   Patient case and plan discussed with Dr. Adela Lank who is in agreement with  plan.  Final Clinical Impressions(s) / ED Diagnoses   Final diagnoses:  Generalized abdominal pain  Hypoxia  Acute chest syndrome (HCC)  Sickle cell pain crisis (HCC)  Abnormal CT of the abdomen    ED Discharge Orders    None       Alysabeth Scalia,  Elmer Sow, PA-C 08/27/18 2219    Melene Plan, DO 08/27/18 2328

## 2018-08-27 NOTE — Progress Notes (Signed)
A consult was received from an ED physician for Vancomycin per pharmacy dosing.  The patient's profile has been reviewed for ht/wt/allergies/indication/available labs.   A one time order has been placed for Vancomycin 1gm.  Further antibiotics/pharmacy consults should be ordered by admitting physician if indicated.                       Thank you,  Otho Bellows 08/27/2018  9:48 PM

## 2018-08-28 ENCOUNTER — Other Ambulatory Visit: Payer: Self-pay

## 2018-08-28 DIAGNOSIS — R0902 Hypoxemia: Secondary | ICD-10-CM

## 2018-08-28 DIAGNOSIS — R935 Abnormal findings on diagnostic imaging of other abdominal regions, including retroperitoneum: Secondary | ICD-10-CM

## 2018-08-28 DIAGNOSIS — D5701 Hb-SS disease with acute chest syndrome: Principal | ICD-10-CM

## 2018-08-28 DIAGNOSIS — D5700 Hb-SS disease with crisis, unspecified: Secondary | ICD-10-CM

## 2018-08-28 DIAGNOSIS — J4520 Mild intermittent asthma, uncomplicated: Secondary | ICD-10-CM

## 2018-08-28 DIAGNOSIS — R1084 Generalized abdominal pain: Secondary | ICD-10-CM

## 2018-08-28 LAB — COMPREHENSIVE METABOLIC PANEL
ALT: 18 U/L (ref 0–44)
AST: 33 U/L (ref 15–41)
Albumin: 3.6 g/dL (ref 3.5–5.0)
Alkaline Phosphatase: 63 U/L (ref 38–126)
Anion gap: 10 (ref 5–15)
BUN: 8 mg/dL (ref 6–20)
CO2: 28 mmol/L (ref 22–32)
Calcium: 8.8 mg/dL — ABNORMAL LOW (ref 8.9–10.3)
Chloride: 100 mmol/L (ref 98–111)
Creatinine, Ser: 0.66 mg/dL (ref 0.61–1.24)
GFR calc Af Amer: >60 mL/min (ref >60)
GFR calc non Af Amer: >60 mL/min (ref >60)
Glucose, Bld: 100 mg/dL — ABNORMAL HIGH (ref 70–99)
Potassium: 4.2 mmol/L (ref 3.5–5.1)
Sodium: 138 mmol/L (ref 135–145)
Total Bilirubin: 3.3 mg/dL — ABNORMAL HIGH (ref 0.3–1.2)
Total Protein: 6.3 g/dL — ABNORMAL LOW (ref 6.5–8.1)

## 2018-08-28 LAB — CBC WITH DIFFERENTIAL/PLATELET
Basophils Absolute: 0 10*3/uL (ref 0.0–0.1)
Basophils Relative: 0 %
Eosinophils Absolute: 0.1 10*3/uL (ref 0.0–0.7)
Eosinophils Relative: 1 %
HCT: 27.2 % — ABNORMAL LOW (ref 39.0–52.0)
Hemoglobin: 9.9 g/dL — ABNORMAL LOW (ref 13.0–17.0)
Lymphocytes Relative: 27 %
Lymphs Abs: 3.8 10*3/uL (ref 0.7–4.0)
MCH: 31 pg (ref 26.0–34.0)
MCHC: 36.4 g/dL — ABNORMAL HIGH (ref 30.0–36.0)
MCV: 85.3 fL (ref 78.0–100.0)
Monocytes Absolute: 3.5 10*3/uL — ABNORMAL HIGH (ref 0.1–1.0)
Monocytes Relative: 25 %
Neutro Abs: 6.7 10*3/uL (ref 1.7–7.7)
Neutrophils Relative %: 47 %
Platelets: 345 10*3/uL (ref 150–400)
RBC: 3.19 MIL/uL — ABNORMAL LOW (ref 4.22–5.81)
RDW: 19.5 % — ABNORMAL HIGH (ref 11.5–15.5)
WBC: 14.1 10*3/uL — ABNORMAL HIGH (ref 4.0–10.5)

## 2018-08-28 LAB — RETICULOCYTES
RBC.: 3.19 MIL/uL — ABNORMAL LOW (ref 4.22–5.81)
Retic Count, Absolute: 223.3 10*3/uL — ABNORMAL HIGH (ref 19.0–186.0)
Retic Ct Pct: 7 % — ABNORMAL HIGH (ref 0.4–3.1)

## 2018-08-28 LAB — TROPONIN I
Troponin I: <0.03 ng/mL (ref <0.03)
Troponin I: <0.03 ng/mL (ref <0.03)

## 2018-08-28 LAB — HIV ANTIBODY (ROUTINE TESTING W REFLEX): HIV Screen 4th Generation wRfx: NONREACTIVE

## 2018-08-28 MED ORDER — VANCOMYCIN HCL 10 G IV SOLR
1500.0000 mg | Freq: Two times a day (BID) | INTRAVENOUS | Status: DC
  Administered 2018-08-28 – 2018-08-29 (×3): 1500 mg via INTRAVENOUS
  Filled 2018-08-28 (×4): qty 1500

## 2018-08-28 NOTE — Progress Notes (Signed)
Pharmacy IV to PO conversion  The patient is ordered Diphenhydramine by the intravenous route with a linked PO option available.  Based on criteria approved by the Pharmacy and Therapeutics Committee and the Medical Executive Committee, the IV option is being discontinued.   Not prescribed to treat or prevent a severe allergic reaction   Not prescribed as premedication prior to receiving blood product, biologic medication, antimicrobial, or chemotherapy agent   The patient has tolerated at least one dose of an oral or enteral medication   The patient has no evidence of active gastrointestinal bleeding or impaired GI absorption (gastrectomy, short bowel, patient on TNA or NPO).   The patient is not undergoing procedural sedation  If you have any questions about this conversion, please contact the Pharmacy Department (ext [phone]).  Thank you.  Bernadene Person, PharmD, BCPS [phone] 08/28/2018, 12:27 PM

## 2018-08-28 NOTE — Progress Notes (Signed)
Subjective: [name] Hill, an 17 year old male with a history of sickle cell anemia, hypertension, and acute chest syndrome was admitted with sickle cell crisis. 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 Patient was treated and evaluated at the sickle cell day infusion center on 08/26/2018 for a pain crisis without sustained improvement.  Patient presented to the ER with chest pain and low back pain. Troponin was negative. CT of abdomen and pelvis showed linear subpleural airspace consolidation vs. ateletasis in bilateral lung bases. Patient's max temp 100.1 on admission. Empiric antibiotics initiated.   Patient says that pain intensity has improved overnight. Pain is primarily to lower back and chest, which is consistent with typical sickle cell crisis. Current pain intensity is 4/10 characterized as intermittent and aching.   Patient appears to be resting comfortably, with girlfriend present.  He is maintaining oxygen saturation at 99% on 2L.  Patient is currently afebrile.  He denies headache, dyspnea, dysuria, abdominal pain, nausea, vomiting, or diarrhea.  Objective:  Vital signs in last 24 hours:  Vitals:   08/27/18 2338 08/28/18 0003 08/28/18 0459 08/28/18 0530  BP: (!) 142/92   (!) 119/59  Pulse: 80   75  Resp: 20 20 20  (!) 31  Temp: 99.1 F (37.3 C)   99 F (37.2 C)  TempSrc: Oral   Oral  SpO2: 92% 94% 99% 94%  Weight:      Height:        Intake/Output from previous day:   Intake/Output Summary (Last 24 hours) at 08/28/2018 0930 Last data filed at 08/28/2018 0534 Gross per 24 hour  Intake 1480 ml  Output -  Net 1480 ml    Physical Exam: General: Alert, awake, oriented x3, in no acute distress.  HEENT: [place]/AT PEERL, EOMI Neck: Trachea midline,  no masses, no thyromegal,y no JVD, no carotid bruit OROPHARYNX:  Moist, No exudate/ erythema/lesions.  Heart: Regular rate and rhythm, without murmurs, rubs, gallops, PMI non-displaced,  no heaves or thrills on palpation.  Lungs: Clear to auscultation, no wheezing or rhonchi noted. No increased vocal fremitus resonant to percussion  Abdomen: Soft, nontender, nondistended, positive bowel sounds, no masses no hepatosplenomegaly noted..  Neuro: No focal neurological deficits noted cranial nerves II through XII grossly intact. DTRs 2+ bilaterally upper and lower extremities. Strength 5 out of 5 in bilateral upper and lower extremities. Musculoskeletal: No warm swelling or erythema around joints, no spinal tenderness noted. Psychiatric: Patient alert and oriented x3, good insight and cognition, good recent to remote recall. Lymph node survey: No cervical axillary or inguinal lymphadenopathy noted.  Lab Results:  Basic Metabolic Panel:    Component Value Date/Time   NA 138 08/28/2018 0524   NA 142 06/26/2018 1416   K 4.2 08/28/2018 0524   CL 100 08/28/2018 0524   CO2 28 08/28/2018 0524   BUN 8 08/28/2018 0524   BUN 7 06/26/2018 1416   CREATININE 0.66 08/28/2018 0524   GLUCOSE 100 (H) 08/28/2018 0524   CALCIUM 8.8 (L) 08/28/2018 0524   CBC:    Component Value Date/Time   WBC 14.1 (H) 08/28/2018 0524   HGB 9.9 (L) 08/28/2018 0524   HGB 9.6 (L) 06/26/2018 1416   HCT 27.2 (L) 08/28/2018 0524   HCT 28.5 (L) 06/26/2018 1416   PLT 345 08/28/2018 0524   PLT 405 06/26/2018 1416   MCV 85.3 08/28/2018 0524   MCV 92 06/26/2018 1416   NEUTROABS 6.7 08/28/2018 0524   NEUTROABS  3.7 06/26/2018 1416   LYMPHSABS 3.8 08/28/2018 0524   LYMPHSABS 3.3 (H) 06/26/2018 1416   MONOABS 3.5 (H) 08/28/2018 0524   EOSABS 0.1 08/28/2018 0524   EOSABS 0.2 06/26/2018 1416   BASOSABS 0.0 08/28/2018 0524   BASOSABS 0.1 06/26/2018 1416    Recent Results (from the past 240 hour(s))  Culture, blood (routine x 2)     Status: None (Preliminary result)   Collection Time: 08/27/18  9:42 PM  Result Value Ref Range Status   Specimen Description BLOOD RIGHT HAND  Final   Special Requests   Final     BOTTLES DRAWN AEROBIC AND ANAEROBIC Blood Culture adequate volume Performed at [facility], [address] [place], [place] [address]    Culture   Final    NO GROWTH < 12 HOURS Performed at [facility], [address] [place], [place] [address]    Report Status PENDING  Incomplete  Culture, blood (routine x 2)     Status: None (Preliminary result)   Collection Time: 08/27/18 11:44 PM  Result Value Ref Range Status   Specimen Description   Final    BLOOD LEFT ARM Blood Culture adequate volume Performed at [facility], [address], [place], [place] [address]    Special Requests   Final    BOTTLES DRAWN AEROBIC AND ANAEROBIC Performed at [facility], [address] [place], [place] [address]    Culture   Final    NO GROWTH < 12 HOURS Performed at [facility], [address] [place], [place] [address]    Report Status PENDING  Incomplete    Studies/Results: Dg Chest 2 View  Result Date: 08/27/2018 CLINICAL DATA:  Sickle cell crisis, chest and back pain EXAM: CHEST - 2 VIEW COMPARISON:  05/16/2018 FINDINGS: Stable cardiomegaly and central vascular prominence. No focal pneumonia, collapse or consolidation. Negative for edema, effusion or pneumothorax. Trachea midline. Chronic osseous changes of sickle cell disease. IMPRESSION: Stable cardiomegaly without acute process. Electronically Signed   By: Judie Petit.  Shick M.D.   On: 08/27/2018 18:10   Ct Abdomen Pelvis W Contrast  Result Date: 08/27/2018 CLINICAL DATA:  Sickle cell pain in chest and back for 2 days. EXAM: CT ABDOMEN AND PELVIS WITH CONTRAST TECHNIQUE: Multidetector CT imaging of the abdomen and pelvis was performed using the standard protocol following bolus administration of intravenous contrast. CONTRAST:  ISOVUE-300 IOPAMIDOL (ISOVUE-300) INJECTION 61% COMPARISON:  Right upper quadrant abdominal ultrasound dated 08/27/2018 FINDINGS: Lower chest:  Partially visualized enlarged heart. Linear subpleural airspace consolidation versus atelectasis. Hepatobiliary: No focal liver abnormality is seen. No gallstones, gallbladder wall thickening, or biliary dilatation. Pancreas: Unremarkable. No pancreatic ductal dilatation or surrounding inflammatory changes. Spleen: Small spleen containing multiple circumscribed hypoattenuated lesions. Adrenals/Urinary Tract: Adrenal glands are unremarkable. Kidneys are normal, without renal calculi, focal lesion, or hydronephrosis. Bladder is unremarkable. Stomach/Bowel: Stomach is within normal limits. Appendix appears normal. No evidence of bowel wall thickening, distention, or inflammatory changes. Vascular/Lymphatic: No significant vascular findings are present. No enlarged abdominal or pelvic lymph nodes. Reproductive: Prostate is unremarkable. Other: No abdominal wall hernia or abnormality. No abdominopelvic ascites. Musculoskeletal: H-shaped vertebral bodies, consistent with the given history of sickle cell disease. IMPRESSION: Partially visualized enlarged heart. Linear subpleural airspace consolidation versus atelectasis in bilateral lung bases. Small spleen containing multiple circumscribed hypoattenuated lesions. Given patient's history these may represent splenic infarcts. Alternatively splenic abscesses may have a similar appearance. H-shaped vertebral bodies, consistent  with sickle cell disease changes. Electronically Signed   By: Ted Mcalpine M.D.   On: 08/27/2018 21:08   US Abdomen Limited Ruq  Result Date: 08/27/2018 CLINICAL DATA:  Generalized abdominal pain x2 days EXAM: ULTRASOUND ABDOMEN LIMITED RIGHT UPPER QUADRANT COMPARISON:  09/28/2016 FINDINGS: Gallbladder: No gallstones, gallbladder wall thickening, or pericholecystic fluid. Negative sonographic Murphy's sign. Common bile duct: Diameter: 4 mm Liver: No focal lesion identified. Within normal limits in parenchymal echogenicity. Portal vein is  patent on color Doppler imaging with normal direction of blood flow towards the liver. IMPRESSION: Negative right upper quadrant ultrasound. Electronically Signed   By: Charline Bills M.D.   On: 08/27/2018 20:14    Medications: Scheduled Meds: . enoxaparin (LOVENOX) injection  40 mg Subcutaneous QHS  . HYDROmorphone   Intravenous Q4H  .  HYDROmorphone (DILAUDID) injection  1 mg Intravenous Once  . hydroxyurea  1,500 mg Oral Daily  . ketorolac  30 mg Intravenous Q6H  . ketorolac  30 mg Intravenous Once  . lisinopril  10 mg Oral Daily  . senna-docusate  1 tablet Oral BID   Continuous Infusions: . sodium chloride 150 mL/hr at 08/27/18 2351  . ceFEPime (MAXIPIME) IV 1 g (08/28/18 0534)  . diphenhydrAMINE    . sodium chloride    . vancomycin     PRN Meds:.acetaminophen **OR** acetaminophen, diphenhydrAMINE **OR** diphenhydrAMINE, naloxone **AND** sodium chloride flush, ondansetron, polyethylene glycol  Assessment/Plan: Principal Problem:   Sickle cell crisis acute chest syndrome (HCC) Active Problems:   Asthma   Hypertension   HCAP (healthcare-associated pneumonia)  Sickle cell anemia with crisis:  Continue custom dose dilaudid PCA  Maintain oxygen saturation above 90% Toradol 30 mg IV every 6 hours Hypotonic IVFs at 100 ml/hr  HCAP:  Continue empiric antibiotics Bronchodilators as needed.  Continue to follow blood cultures Follow strep pneumonia urinary antigen Currently afebrile  Asthma Stable.  Maintain o2 saturation MDIs as needed  Hypertension: Stable. Continue Lisinopril per preadmission dose BMP in am  Leukocytosis:  WBCs 14.1. Patient afebrile.  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 More that likely represent splenic infarct consistent with sickle cell anemia. Outpatient follow up.   Code Status: Full Code Family Communication:  N/A Disposition Plan: Not yet ready for discharge   Kahliyah Dick Rennis Petty  APRN, MSN, FNP-C [facility] [address] [place], [place] [address] [phone]  If 7PM-7AM, please contact night-coverage.  08/28/2018, 9:30 AM  LOS: 1 day

## 2018-08-29 DIAGNOSIS — J189 Pneumonia, unspecified organism: Secondary | ICD-10-CM

## 2018-08-29 LAB — COMPREHENSIVE METABOLIC PANEL
ALT: 16 U/L (ref 0–44)
AST: 28 U/L (ref 15–41)
Albumin: 3.2 g/dL — ABNORMAL LOW (ref 3.5–5.0)
Alkaline Phosphatase: 55 U/L (ref 38–126)
Anion gap: 9 (ref 5–15)
BUN: 9 mg/dL (ref 6–20)
CO2: 25 mmol/L (ref 22–32)
Calcium: 8.6 mg/dL — ABNORMAL LOW (ref 8.9–10.3)
Chloride: 103 mmol/L (ref 98–111)
Creatinine, Ser: 0.72 mg/dL (ref 0.61–1.24)
GFR calc Af Amer: >60 mL/min (ref >60)
GFR calc non Af Amer: >60 mL/min (ref >60)
Glucose, Bld: 122 mg/dL — ABNORMAL HIGH (ref 70–99)
Potassium: 3.9 mmol/L (ref 3.5–5.1)
Sodium: 137 mmol/L (ref 135–145)
Total Bilirubin: 3.7 mg/dL — ABNORMAL HIGH (ref 0.3–1.2)
Total Protein: 6 g/dL — ABNORMAL LOW (ref 6.5–8.1)

## 2018-08-29 LAB — BLOOD CULTURE ID PANEL (REFLEXED)

## 2018-08-29 LAB — CBC WITH DIFFERENTIAL/PLATELET
Basophils Absolute: 0 10*3/uL (ref 0.0–0.1)
Basophils Relative: 0 %
Eosinophils Absolute: 0.1 10*3/uL (ref 0.0–0.7)
Eosinophils Relative: 1 %
HCT: 24.5 % — ABNORMAL LOW (ref 39.0–52.0)
Hemoglobin: 8.9 g/dL — ABNORMAL LOW (ref 13.0–17.0)
Lymphocytes Relative: 16 %
Lymphs Abs: 2 10*3/uL (ref 0.7–4.0)
MCH: 30.8 pg (ref 26.0–34.0)
MCHC: 36.3 g/dL — ABNORMAL HIGH (ref 30.0–36.0)
MCV: 84.8 fL (ref 78.0–100.0)
Monocytes Absolute: 1.8 10*3/uL — ABNORMAL HIGH (ref 0.1–1.0)
Monocytes Relative: 15 %
Neutro Abs: 8.1 10*3/uL — ABNORMAL HIGH (ref 1.7–7.7)
Neutrophils Relative %: 68 %
Platelets: 317 10*3/uL (ref 150–400)
RBC: 2.89 MIL/uL — ABNORMAL LOW (ref 4.22–5.81)
RDW: 18.6 % — ABNORMAL HIGH (ref 11.5–15.5)
WBC: 12 10*3/uL — ABNORMAL HIGH (ref 4.0–10.5)

## 2018-08-29 LAB — RETICULOCYTES
RBC.: 2.89 MIL/uL — ABNORMAL LOW (ref 4.22–5.81)
Retic Count, Absolute: 219.6 10*3/uL — ABNORMAL HIGH (ref 19.0–186.0)
Retic Ct Pct: 7.6 % — ABNORMAL HIGH (ref 0.4–3.1)

## 2018-08-29 LAB — MRSA PCR SCREENING: MRSA by PCR: NEGATIVE

## 2018-08-29 NOTE — Progress Notes (Signed)
PHARMACY - PHYSICIAN COMMUNICATION CRITICAL VALUE ALERT - BLOOD CULTURE IDENTIFICATION (BCID)  [name] Hill is an 17 y.o. male who presented to [facility] on 08/27/2018 with a chief complaint of sickle cell pain.  Assessment:  HCAP (include suspected source if known) BCID 1 of 4 bottle staph species, likely contaminant  Name of physician (or Provider) Contacted: Bodenheimer, NP  Current antibiotics: Cefepime and Vancomycin  Changes to prescribed antibiotics recommended:  D/c Vancomycin when clinically appropriate. F/u MRSA PCR screening  Results for orders placed or performed during the hospital encounter of 08/27/18  Blood Culture ID Panel (Reflexed) (Collected: 08/27/2018  9:42 PM)  Result Value Ref Range   Enterococcus species NOT DETECTED NOT DETECTED   Listeria monocytogenes NOT DETECTED NOT DETECTED   Staphylococcus species DETECTED (A) NOT DETECTED   Staphylococcus aureus (BCID) NOT DETECTED NOT DETECTED   Methicillin resistance NOT DETECTED NOT DETECTED   Streptococcus species NOT DETECTED NOT DETECTED   Streptococcus agalactiae NOT DETECTED NOT DETECTED   Streptococcus pneumoniae NOT DETECTED NOT DETECTED   Streptococcus pyogenes NOT DETECTED NOT DETECTED   Acinetobacter baumannii NOT DETECTED NOT DETECTED   Enterobacteriaceae species NOT DETECTED NOT DETECTED   Enterobacter cloacae complex NOT DETECTED NOT DETECTED   Escherichia coli NOT DETECTED NOT DETECTED   Klebsiella oxytoca NOT DETECTED NOT DETECTED   Klebsiella pneumoniae NOT DETECTED NOT DETECTED   Proteus species NOT DETECTED NOT DETECTED   Serratia marcescens NOT DETECTED NOT DETECTED   Haemophilus influenzae NOT DETECTED NOT DETECTED   Neisseria meningitidis NOT DETECTED NOT DETECTED   Pseudomonas aeruginosa NOT DETECTED NOT DETECTED   Candida albicans NOT DETECTED NOT DETECTED   Candida glabrata NOT DETECTED NOT DETECTED   Candida krusei NOT DETECTED NOT DETECTED   Candida parapsilosis NOT DETECTED NOT  DETECTED   Candida tropicalis NOT DETECTED NOT DETECTED    Lorenza Evangelist 08/29/2018  2:03 AM

## 2018-08-29 NOTE — Progress Notes (Signed)
Patient ID: [name] Hill, male   DOB: 2001-10-26, 17 y.o.   MRN: [id number] Subjective:  Patient is doing much better today, says pain is at 5/10, no fever, no cough. Abdominal pain has subsided. Still on antibiotics but fever has subsided. He denies any chest pain, no urinary symptom, no change in bowel habit  Objective:  Vital signs in last 24 hours:  Vitals:   08/29/18 0549 08/29/18 0847 08/29/18 1255 08/29/18 1431  BP: 139/63   126/63  Pulse: 85   84  Resp: 18 20 20 18   Temp: 99.8 F (37.7 C)     TempSrc: Oral     SpO2: 94% 97% 95% 96%  Weight:      Height:       Intake/Output from previous day:   Intake/Output Summary (Last 24 hours) at 08/29/2018 1445 Last data filed at 08/29/2018 0700 Gross per 24 hour  Intake 1900 ml  Output -  Net 1900 ml    Physical Exam: General: Alert, awake, oriented x3, in no acute distress.  HEENT: [place]/AT PEERL, EOMI Neck: Trachea midline,  no masses, no thyromegal,y no JVD, no carotid bruit OROPHARYNX:  Moist, No exudate/ erythema/lesions.  Heart: Regular rate and rhythm, without murmurs, rubs, gallops, PMI non-displaced, no heaves or thrills on palpation.  Lungs: Clear to auscultation, no wheezing or rhonchi noted. No increased vocal fremitus resonant to percussion  Abdomen: Soft, nontender, nondistended, positive bowel sounds, no masses no hepatosplenomegaly noted..  Neuro: No focal neurological deficits noted cranial nerves II through XII grossly intact. DTRs 2+ bilaterally upper and lower extremities. Strength 5 out of 5 in bilateral upper and lower extremities. Musculoskeletal: No warm swelling or erythema around joints, no spinal tenderness noted. Psychiatric: Patient alert and oriented x3, good insight and cognition, good recent to remote recall. Lymph node survey: No cervical axillary or inguinal lymphadenopathy noted.  Lab Results:  Basic Metabolic Panel:    Component Value Date/Time   NA 137 08/29/2018 0517   NA 142  06/26/2018 1416   K 3.9 08/29/2018 0517   CL 103 08/29/2018 0517   CO2 25 08/29/2018 0517   BUN 9 08/29/2018 0517   BUN 7 06/26/2018 1416   CREATININE 0.72 08/29/2018 0517   GLUCOSE 122 (H) 08/29/2018 0517   CALCIUM 8.6 (L) 08/29/2018 0517   CBC:    Component Value Date/Time   WBC 12.0 (H) 08/29/2018 0517   HGB 8.9 (L) 08/29/2018 0517   HGB 9.6 (L) 06/26/2018 1416   HCT 24.5 (L) 08/29/2018 0517   HCT 28.5 (L) 06/26/2018 1416   PLT 317 08/29/2018 0517   PLT 405 06/26/2018 1416   MCV 84.8 08/29/2018 0517   MCV 92 06/26/2018 1416   NEUTROABS 8.1 (H) 08/29/2018 0517   NEUTROABS 3.7 06/26/2018 1416   LYMPHSABS 2.0 08/29/2018 0517   LYMPHSABS 3.3 (H) 06/26/2018 1416   MONOABS 1.8 (H) 08/29/2018 0517   EOSABS 0.1 08/29/2018 0517   EOSABS 0.2 06/26/2018 1416   BASOSABS 0.0 08/29/2018 0517   BASOSABS 0.1 06/26/2018 1416    Recent Results (from the past 240 hour(s))  Culture, blood (routine x 2)     Status: Abnormal (Preliminary result)   Collection Time: 08/27/18  9:42 PM  Result Value Ref Range Status   Specimen Description BLOOD RIGHT HAND  Final   Special Requests   Final    BOTTLES DRAWN AEROBIC AND ANAEROBIC Blood Culture adequate volume Performed at [facility], [address] [place], [place]  [address]    Culture  Setup Time   Final    ANAEROBIC BOTTLE ONLY GRAM POSITIVE COCCI CRITICAL RESULT CALLED TO, READ BACK BY AND VERIFIED WITH: BETH GREEN @ 0201 ON 08/29/18 BY ROBINSON Z.    Culture (A)  Final    STAPHYLOCOCCUS SPECIES (COAGULASE NEGATIVE) THE SIGNIFICANCE OF ISOLATING THIS ORGANISM FROM A SINGLE SET OF BLOOD CULTURES WHEN MULTIPLE SETS ARE DRAWN IS UNCERTAIN. PLEASE NOTIFY THE MICROBIOLOGY DEPARTMENT WITHIN ONE WEEK IF SPECIATION AND SENSITIVITIES ARE REQUIRED. Performed at [facility], [address] [place], [place] [address]    Report Status PENDING  Incomplete  Blood Culture ID Panel (Reflexed)     Status: Abnormal    Collection Time: 08/27/18  9:42 PM  Result Value Ref Range Status   Enterococcus species NOT DETECTED NOT DETECTED Final   Listeria monocytogenes NOT DETECTED NOT DETECTED Final   Staphylococcus species DETECTED (A) NOT DETECTED Final    Comment: Methicillin (oxacillin) susceptible coagulase negative staphylococcus. Possible blood culture contaminant (unless isolated from more than one blood culture draw or clinical case suggests pathogenicity). No antibiotic treatment is indicated for blood  culture contaminants. CRITICAL RESULT CALLED TO, READ BACK BY AND VERIFIED WITH: BETH GREEN @ 0201 ON 08/29/18 BY ROBINSON Z.     Staphylococcus aureus (BCID) NOT DETECTED NOT DETECTED Final   Methicillin resistance NOT DETECTED NOT DETECTED Final   Streptococcus species NOT DETECTED NOT DETECTED Final   Streptococcus agalactiae NOT DETECTED NOT DETECTED Final   Streptococcus pneumoniae NOT DETECTED NOT DETECTED Final   Streptococcus pyogenes NOT DETECTED NOT DETECTED Final   Acinetobacter baumannii NOT DETECTED NOT DETECTED Final   Enterobacteriaceae species NOT DETECTED NOT DETECTED Final   Enterobacter cloacae complex NOT DETECTED NOT DETECTED Final   Escherichia coli NOT DETECTED NOT DETECTED Final   Klebsiella oxytoca NOT DETECTED NOT DETECTED Final   Klebsiella pneumoniae NOT DETECTED NOT DETECTED Final   Proteus species NOT DETECTED NOT DETECTED Final   Serratia marcescens NOT DETECTED NOT DETECTED Final   Haemophilus influenzae NOT DETECTED NOT DETECTED Final   Neisseria meningitidis NOT DETECTED NOT DETECTED Final   Pseudomonas aeruginosa NOT DETECTED NOT DETECTED Final   Candida albicans NOT DETECTED NOT DETECTED Final   Candida glabrata NOT DETECTED NOT DETECTED Final   Candida krusei NOT DETECTED NOT DETECTED Final   Candida parapsilosis NOT DETECTED NOT DETECTED Final   Candida tropicalis NOT DETECTED NOT DETECTED Final  Culture, blood (routine x 2)     Status: None (Preliminary  result)   Collection Time: 08/27/18 11:44 PM  Result Value Ref Range Status   Specimen Description   Final    BLOOD LEFT ARM Blood Culture adequate volume Performed at [facility], [address], [place], [place] [address]    Special Requests   Final    BOTTLES DRAWN AEROBIC AND ANAEROBIC Performed at [facility], [address], [place], [place] [address]    Culture   Final    NO GROWTH < 12 HOURS Performed at [facility], [address] [place], [place] [address]    Report Status PENDING  Incomplete    Studies/Results: Dg Chest 2 View  Result Date: 08/27/2018 CLINICAL DATA:  Sickle cell crisis, chest and back pain EXAM: CHEST - 2 VIEW COMPARISON:  05/16/2018 FINDINGS: Stable cardiomegaly and central vascular prominence. No focal pneumonia, collapse or consolidation. Negative for edema, effusion or pneumothorax. Trachea midline. Chronic osseous changes of sickle  cell disease. IMPRESSION: Stable cardiomegaly without acute process. Electronically Signed   By: Judie Petit.  Shick M.D.   On: 08/27/2018 18:10   Ct Abdomen Pelvis W Contrast  Result Date: 08/27/2018 CLINICAL DATA:  Sickle cell pain in chest and back for 2 days. EXAM: CT ABDOMEN AND PELVIS WITH CONTRAST TECHNIQUE: Multidetector CT imaging of the abdomen and pelvis was performed using the standard protocol following bolus administration of intravenous contrast. CONTRAST:  ISOVUE-300 IOPAMIDOL (ISOVUE-300) INJECTION 61% COMPARISON:  Right upper quadrant abdominal ultrasound dated 08/27/2018 FINDINGS: Lower chest: Partially visualized enlarged heart. Linear subpleural airspace consolidation versus atelectasis. Hepatobiliary: No focal liver abnormality is seen. No gallstones, gallbladder wall thickening, or biliary dilatation. Pancreas: Unremarkable. No pancreatic ductal dilatation or surrounding inflammatory changes. Spleen: Small spleen containing multiple circumscribed hypoattenuated  lesions. Adrenals/Urinary Tract: Adrenal glands are unremarkable. Kidneys are normal, without renal calculi, focal lesion, or hydronephrosis. Bladder is unremarkable. Stomach/Bowel: Stomach is within normal limits. Appendix appears normal. No evidence of bowel wall thickening, distention, or inflammatory changes. Vascular/Lymphatic: No significant vascular findings are present. No enlarged abdominal or pelvic lymph nodes. Reproductive: Prostate is unremarkable. Other: No abdominal wall hernia or abnormality. No abdominopelvic ascites. Musculoskeletal: H-shaped vertebral bodies, consistent with the given history of sickle cell disease. IMPRESSION: Partially visualized enlarged heart. Linear subpleural airspace consolidation versus atelectasis in bilateral lung bases. Small spleen containing multiple circumscribed hypoattenuated lesions. Given patient's history these may represent splenic infarcts. Alternatively splenic abscesses may have a similar appearance. H-shaped vertebral bodies, consistent with sickle cell disease changes. Electronically Signed   By: Ted Mcalpine M.D.   On: 08/27/2018 21:08   US Abdomen Limited Ruq  Result Date: 08/27/2018 CLINICAL DATA:  Generalized abdominal pain x2 days EXAM: ULTRASOUND ABDOMEN LIMITED RIGHT UPPER QUADRANT COMPARISON:  09/28/2016 FINDINGS: Gallbladder: No gallstones, gallbladder wall thickening, or pericholecystic fluid. Negative sonographic Murphy's sign. Common bile duct: Diameter: 4 mm Liver: No focal lesion identified. Within normal limits in parenchymal echogenicity. Portal vein is patent on color Doppler imaging with normal direction of blood flow towards the liver. IMPRESSION: Negative right upper quadrant ultrasound. Electronically Signed   By: Charline Bills M.D.   On: 08/27/2018 20:14    Medications: Scheduled Meds: . enoxaparin (LOVENOX) injection  40 mg Subcutaneous QHS  . HYDROmorphone   Intravenous Q4H  . hydroxyurea  1,500 mg Oral Daily   . ketorolac  30 mg Intravenous Q6H  . lisinopril  10 mg Oral Daily  . senna-docusate  1 tablet Oral BID   Continuous Infusions: . sodium chloride 100 mL/hr at 08/29/18 0855  . ceFEPime (MAXIPIME) IV 1 g (08/29/18 1305)   PRN Meds:.acetaminophen **OR** acetaminophen, diphenhydrAMINE **OR** [DISCONTINUED] diphenhydrAMINE, naloxone **AND** sodium chloride flush, ondansetron, polyethylene glycol   Assessment/Plan: Principal Problem:   Sickle cell crisis acute chest syndrome (HCC) Active Problems:   Asthma   Hypertension   HCAP (healthcare-associated pneumonia)   Generalized abdominal pain   Abnormal CT of the abdomen  1. Hb Sickle Cell Disease with crisis: Reduce IVF to [facility], continue weight based Dilaudid PCA, IV Toradol 30 mg Q 6 H, Monitor vitals very closely, Re-evaluate pain scale regularly, 2 L of Oxygen by [place]. 2. HCAP: D/C Vancomycin, continue Cefepime, continue other management 3. Leukocytosis: Improving, continue antibiotics 4. Sickle Cell Anemia: Hb is stable at baseline, no indication for blood transfusion at this time. 5. Splenic Infarction: No new symptom, patient will be referred to hematologist for outpatient follow up. Patient is hemodynamically stable  6. Hypertension: Continue  Lisinopril  Code Status: Full Code Family Communication: N/A Disposition Plan: For possible discharge tomorrow  Mohamedamin Nifong  If 7PM-7AM, please contact night-coverage.  08/29/2018, 2:45 PM  LOS: 2 days

## 2018-08-30 DIAGNOSIS — I10 Essential (primary) hypertension: Secondary | ICD-10-CM

## 2018-08-30 LAB — RETICULOCYTES
RBC.: 3.02 MIL/uL — ABNORMAL LOW (ref 4.22–5.81)
Retic Count, Absolute: 172.1 10*3/uL (ref 19.0–186.0)
Retic Ct Pct: 5.7 % — ABNORMAL HIGH (ref 0.4–3.1)

## 2018-08-30 LAB — CBC WITH DIFFERENTIAL/PLATELET
Basophils Absolute: 0.1 10*3/uL (ref 0.0–0.1)
Basophils Relative: 0 %
Eosinophils Absolute: 0.2 10*3/uL (ref 0.0–0.7)
Eosinophils Relative: 2 %
HCT: 25.8 % — ABNORMAL LOW (ref 39.0–52.0)
Hemoglobin: 9.3 g/dL — ABNORMAL LOW (ref 13.0–17.0)
Lymphocytes Relative: 29 %
Lymphs Abs: 3.4 10*3/uL (ref 0.7–4.0)
MCH: 30.8 pg (ref 26.0–34.0)
MCHC: 36 g/dL (ref 30.0–36.0)
MCV: 85.4 fL (ref 78.0–100.0)
Monocytes Absolute: 1.8 10*3/uL — ABNORMAL HIGH (ref 0.1–1.0)
Monocytes Relative: 16 %
Neutro Abs: 6 10*3/uL (ref 1.7–7.7)
Neutrophils Relative %: 53 %
Platelets: 370 10*3/uL (ref 150–400)
RBC: 3.02 MIL/uL — ABNORMAL LOW (ref 4.22–5.81)
RDW: 18.4 % — ABNORMAL HIGH (ref 11.5–15.5)
WBC: 11.5 10*3/uL — ABNORMAL HIGH (ref 4.0–10.5)

## 2018-08-30 LAB — COMPREHENSIVE METABOLIC PANEL
ALT: 22 U/L (ref 0–44)
AST: 43 U/L — ABNORMAL HIGH (ref 15–41)
Albumin: 3.6 g/dL (ref 3.5–5.0)
Alkaline Phosphatase: 60 U/L (ref 38–126)
Anion gap: 10 (ref 5–15)
BUN: 11 mg/dL (ref 6–20)
CO2: 25 mmol/L (ref 22–32)
Calcium: 8.8 mg/dL — ABNORMAL LOW (ref 8.9–10.3)
Chloride: 102 mmol/L (ref 98–111)
Creatinine, Ser: 0.66 mg/dL (ref 0.61–1.24)
GFR calc Af Amer: >60 mL/min (ref >60)
GFR calc non Af Amer: >60 mL/min (ref >60)
Glucose, Bld: 117 mg/dL — ABNORMAL HIGH (ref 70–99)
Potassium: 4.2 mmol/L (ref 3.5–5.1)
Sodium: 137 mmol/L (ref 135–145)
Total Bilirubin: 3.8 mg/dL — ABNORMAL HIGH (ref 0.3–1.2)
Total Protein: 6.8 g/dL (ref 6.5–8.1)

## 2018-08-30 LAB — CULTURE, BLOOD (ROUTINE X 2): Special Requests: ADEQUATE

## 2018-08-30 MED ORDER — AMOXICILLIN-POT CLAVULANATE 875-125 MG PO TABS: 1.0000 | ORAL_TABLET | Freq: Two times a day (BID) | ORAL | 0 refills | Status: DC

## 2018-08-30 NOTE — Discharge Summary (Signed)
Physician Discharge Summary  [name] Hill:[id number] DOB: [birth year] DOA: 08/27/2018  PCP: Kallie Locks, FNP  Admit date: 08/27/2018  Discharge date: 08/30/2018  Discharge Diagnoses:  Principal Problem:   Sickle cell crisis acute chest syndrome (HCC) Active Problems:   Asthma   Hypoxia   Hypertension   HCAP (healthcare-associated pneumonia)   Generalized abdominal pain   Abnormal CT of the abdomen   Discharge Condition: Stable  Disposition:  Follow-up Information    Kallie Locks, FNP. Go in 1 week(s).   Specialty:  Family Medicine Contact information: [address] [place] [address] [phone]          Pt is discharged home in good condition and is to follow up with Kallie Locks, FNP this week to have labs evaluated. [name] Hill is instructed to increase activity slowly and balance with rest for the next few days, and use prescribed medication to complete treatment of pain  Diet: Regular Wt Readings from Last 3 Encounters:  08/27/18 83.6 kg (86 %, Z= 1.08)*  08/26/18 79.4 kg (79 %, Z= 0.81)*  06/26/18 84.8 kg (88 %, Z= 1.17)*   * Growth percentiles are based on CDC (Boys, 2-20 Years) data.    History of present illness:  [name] Hill is a 17 y.o. male with medical history significant of sickle cell anemia, history of multiple episodes of acute chest syndrome, airway hyperactivity (diagnosed as asthma), blurred vision, sickle cell nephropathy who was seen at this facility on 08/25/2018, discharged home with follow-up with a sickle cell team and now is returning to the emergency department due to sickle cell crisis to the ED.  Per patient, since he was discharged the pain has recurred.  He went to the sickle cell clinic yesterday, was seen and discharged. However, he mentions that yesterday evening he had subjective fever and had night sweats last night.  Today, the patient's symptoms have recurred so he return to the ED.  He denies  travel history or sick contacts.  He denies rhinorrhea, sore throat, cough, hemoptysis, palpitations, dizziness or diaphoresis.  No PND, orthopnea or pitting edema of the lower extremities.  He has had some RUQ tenderness amild epigastric discomfort associated with nausea and had an episode of emesis earlier today.  He denies diarrhea, constipation, melena or hematochezia.  No dysuria, frequency or hematuria.  He denies skin rashes or pruritus.  No heat or cold intolerance.  No polyuria, polydipsia or polyphagia.  ED Course: Initial vital signs temperature 100 F, pulse 103, respirations 18, blood pressure 156/89 mmHg and O2 sat 86% on room air.  His white count was 17.5, hemoglobin 11.1 g/dL and platelets 629.  CMP showed a glucose of 106 and total bilirubin of 4.4 mg/dL, all other values are within normal limits.  His chest radiograph shows stable cardiomegaly with no acute process.  He had a normal RUQ ultrasound.  CT chest showed linear subpleural airspace consolidation versus atelectasis in the lower lung base.  He had a small spleen containing multiple circumscribed, hypoattenuated lesions.  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  H shaped vertebral bodies, consistent with sickle cell changes.  Please see images and full radiology report for further detail.  Hospital Course:  Patient was admitted for Healthcare Associated Pneumonia Vs Sickle Cell Crisis Acute Chest Syndrome and managed appropriately with IVF, IV Antibiotics, IV Dilaudid via PCA and IV Toradol, as well as other adjunct therapies per sickle cell pain management protocols. Patient did very  well on these regimen, pain returned to baseline, chest pain resolved, WBCC improved, Hb remained stable at baseline, he did not require blood transfusion. Today, he is doing well,his vitals were within normal limits, saturating well on room air, no fever, ambulating well, eating and drinking well without restrictions. Patient was discharged  home today in a hemodynamically stable condition. He will follow up with his PCP within one week of this discharge, he already has appointment.    Discharge Exam: Vitals:   08/30/18 0538 08/30/18 0758  BP: 131/69   Pulse: 88 79  Resp: (!) 28 17  Temp: 99.4 F (37.4 C)   SpO2: 94% 95%   Vitals:   08/30/18 0201 08/30/18 0442 08/30/18 0538 08/30/18 0758  BP: 126/78  131/69   Pulse: 71  88 79  Resp: 20 18 (!) 28 17  Temp: 98.5 F (36.9 C)  99.4 F (37.4 C)   TempSrc: Oral  Oral   SpO2: 98% 93% 94% 95%  Weight:      Height:        General appearance : Awake, alert, not in any distress. Speech Clear. Not toxic looking HEENT: Atraumatic and Normocephalic, pupils equally reactive to light and accomodation Neck: Supple, no JVD. No cervical lymphadenopathy.  Chest: Good air entry bilaterally, no added sounds  CVS: S1 S2 regular, no murmurs.  Abdomen: Bowel sounds present, Non tender and not distended with no gaurding, rigidity or rebound. Extremities: B/L Lower Ext shows no edema, both legs are warm to touch Neurology: Awake alert, and oriented X 3, CN II-XII intact, Non focal Skin: No Rash  Discharge Instructions  Discharge Instructions    Diet - low sodium heart healthy   Complete by:  As directed    Increase activity slowly   Complete by:  As directed      Allergies as of 08/30/2018      Reactions   Other Cough   Seasonal allergies.      Medication List    STOP taking these medications   loperamide 2 MG capsule Commonly known as:  IMODIUM   sulfamethoxazole-trimethoprim 800-160 MG tablet Commonly known as:  BACTRIM DS,SEPTRA DS     TAKE these medications   acetaminophen 650 MG CR tablet Commonly known as:  TYLENOL Take 1,300 mg by mouth every 8 (eight) hours as needed for pain.   albuterol 108 (90 Base) MCG/ACT inhaler Commonly known as:  PROVENTIL HFA;VENTOLIN HFA Inhale 2 puffs into the lungs every 4 (four) hours as needed for wheezing or shortness of  breath.   amoxicillin-clavulanate 875-125 MG tablet Commonly known as:  AUGMENTIN Take 1 tablet by mouth every 12 (twelve) hours for 7 days.   hydroxyurea 400 MG capsule Commonly known as:  DROXIA Take 4 capsules (1,600 mg total) by mouth daily.   lisinopril 10 MG tablet Commonly known as:  PRINIVIL,ZESTRIL Take 1 tablet (10 mg total) by mouth daily.   oxyCODONE 5 MG immediate release tablet Commonly known as:  Oxy IR/ROXICODONE Take 1 tablet (5 mg total) by mouth every 6 (six) hours as needed for severe pain.       The results of significant diagnostics from this hospitalization (including imaging, microbiology, ancillary and laboratory) are listed below for reference.    Significant Diagnostic Studies: Dg Chest 2 View  Result Date: 08/27/2018 CLINICAL DATA:  Sickle cell crisis, chest and back pain EXAM: CHEST - 2 VIEW COMPARISON:  05/16/2018 FINDINGS: Stable cardiomegaly and central vascular prominence. No focal pneumonia, collapse  or consolidation. Negative for edema, effusion or pneumothorax. Trachea midline. Chronic osseous changes of sickle cell disease. IMPRESSION: Stable cardiomegaly without acute process. Electronically Signed   By: Judie Petit.  Shick M.D.   On: 08/27/2018 18:10   Ct Angio Chest Pe W Or Wo Contrast  Result Date: 08/26/2018 CLINICAL DATA:  Chest and back pain in sickle cell crisis EXAM: CT ANGIOGRAPHY CHEST WITH CONTRAST TECHNIQUE: Multidetector CT imaging of the chest was performed using the standard protocol during bolus administration of intravenous contrast. Multiplanar CT image reconstructions and MIPs were obtained to evaluate the vascular anatomy. CONTRAST:  80mL ISOVUE-370 IOPAMIDOL (ISOVUE-370) INJECTION 76% COMPARISON:  CTA chest dated 05/16/2018 FINDINGS: Cardiovascular: Satisfactory opacification the bilateral pulmonary arteries to the lobar level. No evidence of pulmonary embolism. Enlargement of the main pulmonary artery. No evidence of thoracic aortic  aneurysm or dissection. The heart is normal in size.  No pericardial effusion. Mediastinum/Nodes: No suspicious mediastinal lymphadenopathy. Visualized thyroid is unremarkable. Lungs/Pleura: Evaluation of the lung parenchyma is constrained by respiratory motion. No suspicious pulmonary nodules. Mild mosaic attenuation. No focal consolidation. No pleural effusion or pneumothorax. Upper Abdomen: Visualized upper abdomen is notable for a hyperdense spleen, related to sickle cell disease. Musculoskeletal: H-shaped lower thoracic vertebral bodies, with endplate changes related to sickle cell disease. Review of the MIP images confirms the above findings. IMPRESSION: No evidence of pulmonary embolism. No evidence of acute cardiopulmonary disease. Electronically Signed   By: Charline Bills M.D.   On: 08/26/2018 01:23   Ct Abdomen Pelvis W Contrast  Result Date: 08/27/2018 CLINICAL DATA:  Sickle cell pain in chest and back for 2 days. EXAM: CT ABDOMEN AND PELVIS WITH CONTRAST TECHNIQUE: Multidetector CT imaging of the abdomen and pelvis was performed using the standard protocol following bolus administration of intravenous contrast. CONTRAST:  ISOVUE-300 IOPAMIDOL (ISOVUE-300) INJECTION 61% COMPARISON:  Right upper quadrant abdominal ultrasound dated 08/27/2018 FINDINGS: Lower chest: Partially visualized enlarged heart. Linear subpleural airspace consolidation versus atelectasis. Hepatobiliary: No focal liver abnormality is seen. No gallstones, gallbladder wall thickening, or biliary dilatation. Pancreas: Unremarkable. No pancreatic ductal dilatation or surrounding inflammatory changes. Spleen: Small spleen containing multiple circumscribed hypoattenuated lesions. Adrenals/Urinary Tract: Adrenal glands are unremarkable. Kidneys are normal, without renal calculi, focal lesion, or hydronephrosis. Bladder is unremarkable. Stomach/Bowel: Stomach is within normal limits. Appendix appears normal. No evidence of bowel  wall thickening, distention, or inflammatory changes. Vascular/Lymphatic: No significant vascular findings are present. No enlarged abdominal or pelvic lymph nodes. Reproductive: Prostate is unremarkable. Other: No abdominal wall hernia or abnormality. No abdominopelvic ascites. Musculoskeletal: H-shaped vertebral bodies, consistent with the given history of sickle cell disease. IMPRESSION: Partially visualized enlarged heart. Linear subpleural airspace consolidation versus atelectasis in bilateral lung bases. Small spleen containing multiple circumscribed hypoattenuated lesions. Given patient's history these may represent splenic infarcts. Alternatively splenic abscesses may have a similar appearance. H-shaped vertebral bodies, consistent with sickle cell disease changes. Electronically Signed   By: Ted Mcalpine M.D.   On: 08/27/2018 21:08   US Abdomen Limited Ruq  Result Date: 08/27/2018 CLINICAL DATA:  Generalized abdominal pain x2 days EXAM: ULTRASOUND ABDOMEN LIMITED RIGHT UPPER QUADRANT COMPARISON:  09/28/2016 FINDINGS: Gallbladder: No gallstones, gallbladder wall thickening, or pericholecystic fluid. Negative sonographic Murphy's sign. Common bile duct: Diameter: 4 mm Liver: No focal lesion identified. Within normal limits in parenchymal echogenicity. Portal vein is patent on color Doppler imaging with normal direction of blood flow towards the liver. IMPRESSION: Negative right upper quadrant ultrasound. Electronically Signed   By: Lurlean Horns  Rito Ehrlich M.D.   On: 08/27/2018 20:14    Microbiology: Recent Results (from the past 240 hour(s))  Culture, blood (routine x 2)     Status: Abnormal   Collection Time: 08/27/18  9:42 PM  Result Value Ref Range Status   Specimen Description BLOOD RIGHT HAND  Final   Special Requests   Final    BOTTLES DRAWN AEROBIC AND ANAEROBIC Blood Culture adequate volume Performed at [facility], [address] [place], [place] [address]     Culture  Setup Time   Final    ANAEROBIC BOTTLE ONLY GRAM POSITIVE COCCI CRITICAL RESULT CALLED TO, READ BACK BY AND VERIFIED WITH: BETH GREEN @ 0201 ON 08/29/18 BY ROBINSON Z.    Culture (A)  Final    STAPHYLOCOCCUS SPECIES (COAGULASE NEGATIVE) THE SIGNIFICANCE OF ISOLATING THIS ORGANISM FROM A SINGLE SET OF BLOOD CULTURES WHEN MULTIPLE SETS ARE DRAWN IS UNCERTAIN. PLEASE NOTIFY THE MICROBIOLOGY DEPARTMENT WITHIN ONE WEEK IF SPECIATION AND SENSITIVITIES ARE REQUIRED. Performed at [facility], [address], [place], [place] [address]    Report Status 08/30/2018 FINAL  Final  Blood Culture ID Panel (Reflexed)     Status: Abnormal   Collection Time: 08/27/18  9:42 PM  Result Value Ref Range Status   Enterococcus species NOT DETECTED NOT DETECTED Final   Listeria monocytogenes NOT DETECTED NOT DETECTED Final   Staphylococcus species DETECTED (A) NOT DETECTED Final    Comment: Methicillin (oxacillin) susceptible coagulase negative staphylococcus. Possible blood culture contaminant (unless isolated from more than one blood culture draw or clinical case suggests pathogenicity). No antibiotic treatment is indicated for blood  culture contaminants. CRITICAL RESULT CALLED TO, READ BACK BY AND VERIFIED WITH: BETH GREEN @ 0201 ON 08/29/18 BY ROBINSON Z.     Staphylococcus aureus (BCID) NOT DETECTED NOT DETECTED Final   Methicillin resistance NOT DETECTED NOT DETECTED Final   Streptococcus species NOT DETECTED NOT DETECTED Final   Streptococcus agalactiae NOT DETECTED NOT DETECTED Final   Streptococcus pneumoniae NOT DETECTED NOT DETECTED Final   Streptococcus pyogenes NOT DETECTED NOT DETECTED Final   Acinetobacter baumannii NOT DETECTED NOT DETECTED Final   Enterobacteriaceae species NOT DETECTED NOT DETECTED Final   Enterobacter cloacae complex NOT DETECTED NOT DETECTED Final   Escherichia coli NOT DETECTED NOT DETECTED Final   Klebsiella oxytoca NOT DETECTED NOT DETECTED Final    Klebsiella pneumoniae NOT DETECTED NOT DETECTED Final   Proteus species NOT DETECTED NOT DETECTED Final   Serratia marcescens NOT DETECTED NOT DETECTED Final   Haemophilus influenzae NOT DETECTED NOT DETECTED Final   Neisseria meningitidis NOT DETECTED NOT DETECTED Final   Pseudomonas aeruginosa NOT DETECTED NOT DETECTED Final   Candida albicans NOT DETECTED NOT DETECTED Final   Candida glabrata NOT DETECTED NOT DETECTED Final   Candida krusei NOT DETECTED NOT DETECTED Final   Candida parapsilosis NOT DETECTED NOT DETECTED Final   Candida tropicalis NOT DETECTED NOT DETECTED Final  Culture, blood (routine x 2)     Status: None (Preliminary result)   Collection Time: 08/27/18 11:44 PM  Result Value Ref Range Status   Specimen Description   Final    BLOOD LEFT ARM Blood Culture adequate volume Performed at [facility], [address] [place], [place] [address]    Special Requests   Final    BOTTLES DRAWN AEROBIC AND ANAEROBIC Performed at [facility], [address], [place], [place] [address]    Culture   Final  NO GROWTH 1 DAY Performed at [facility], [address] [place], [place] [address]    Report Status PENDING  Incomplete  MRSA PCR Screening     Status: None   Collection Time: 08/28/18 12:30 PM  Result Value Ref Range Status   MRSA by PCR NEGATIVE NEGATIVE Final    Comment:        The GeneXpert MRSA Assay (FDA approved for NASAL specimens only), is one component of a comprehensive MRSA colonization surveillance program. It is not intended to diagnose MRSA infection nor to guide or monitor treatment for MRSA infections. Performed at [facility], [address], [place], [place] [address]      Labs: Basic Metabolic Panel: Recent Labs  Lab 08/26/18 1306 08/27/18 1921 08/27/18 2230 08/28/18 0524 08/29/18 0517 08/30/18 0515  NA 141 138  --  138 137 137  K 4.2 4.0  --  4.2 3.9 4.2  CL 107  100  --  100 103 102  CO2 26 26  --  28 25 25   GLUCOSE 98 106*  --  100* 122* 117*  BUN 8 7  --  8 9 11   CREATININE 0.67 0.65  --  0.66 0.72 0.66  CALCIUM 9.3 9.3  --  8.8* 8.6* 8.8*  MG  --   --  2.0  --   --   --   PHOS  --   --  4.1  --   --   --    Liver Function Tests: Recent Labs  Lab 08/26/18 1306 08/27/18 1921 08/28/18 0524 08/29/18 0517 08/30/18 0515  AST 46* 39 33 28 43*  ALT 26 21 18 16 22   ALKPHOS 74 76 63 55 60  BILITOT 3.1* 4.4* 3.3* 3.7* 3.8*  PROT 7.2 7.5 6.3* 6.0* 6.8  ALBUMIN 4.3 4.2 3.6 3.2* 3.6   Recent Labs  Lab 08/27/18 1923  LIPASE 28   No results for input(s): AMMONIA in the last 168 hours. CBC: Recent Labs  Lab 08/26/18 1306 08/27/18 1921 08/28/18 0524 08/29/18 0517 08/30/18 0515  WBC 15.1* 17.5* 14.1* 12.0* 11.5*  NEUTROABS 10.5* 11.6* 6.7 8.1* 6.0  HGB 10.1* 11.1* 9.9* 8.9* 9.3*  HCT 27.7* 30.8* 27.2* 24.5* 25.8*  MCV 85.2 86.0 85.3 84.8 85.4  PLT 428* 434* 345 317 370   Cardiac Enzymes: Recent Labs  Lab 08/27/18 1921 08/27/18 2344 08/28/18 0524  TROPONINI <0.03 <0.03 <0.03   BNP: Invalid input(s): POCBNP CBG: No results for input(s): GLUCAP in the last 168 hours.  Time coordinating discharge: 50 minutes  Signed:  Elayne Gruver  [facility] 08/30/2018, 10:39 AM

## 2018-08-30 NOTE — Discharge Instructions (Signed)
Abdominal Pain, Adult Many things can cause belly (abdominal) pain. Most times, belly pain is not dangerous. Many cases of belly pain can be watched and treated at home. Sometimes belly pain is serious, though. Your doctor will try to find the cause of your belly pain. Follow these instructions at home:  Take over-the-counter and prescription medicines only as told by your doctor. Do not take medicines that help you poop (laxatives) unless told to by your doctor.  Drink enough fluid to keep your pee (urine) clear or pale yellow.  Watch your belly pain for any changes.  Keep all follow-up visits as told by your doctor. This is important. Contact a doctor if:  Your belly pain changes or gets worse.  You are not hungry, or you lose weight without trying.  You are having trouble pooping (constipated) or have watery poop (diarrhea) for more than 2-3 days.  You have pain when you pee or poop.  Your belly pain wakes you up at night.  Your pain gets worse with meals, after eating, or with certain foods.  You are throwing up and cannot keep anything down.  You have a fever. Get help right away if:  Your pain does not go away as soon as your doctor says it should.  You cannot stop throwing up.  Your pain is only in areas of your belly, such as the right side or the left lower part of the belly.  You have bloody or black poop, or poop that looks like tar.  You have very bad pain, cramping, or bloating in your belly.  You have signs of not having enough fluid or water in your body (dehydration), such as: ? Dark pee, very little pee, or no pee. ? Cracked lips. ? Dry mouth. ? Sunken eyes. ? Sleepiness. ? Weakness. This information is not intended to replace advice given to you by your health care provider. Make sure you discuss any questions you have with your health care provider. Document Released: 04/29/2008 Document Revised: 05/31/2016 Document Reviewed: 04/24/2016 Elsevier  Interactive Patient Education  2018 [employer]  Sickle Cell Anemia, Adult Sickle cell anemia is a condition in which red blood cells have an abnormal sickle shape. This abnormal shape shortens the cells life span, which results in a lower than normal concentration of red blood cells in the blood. The sickle shape also causes the cells to clump together and block free blood flow through the blood vessels. As a result, the tissues and organs of the body do not receive enough oxygen. Sickle cell anemia causes organ damage and pain and increases the risk of infection. What are the causes? Sickle cell anemia is a genetic disorder. Those who receive two copies of the gene have the condition, and those who receive one copy have the trait. What increases the risk? The sickle cell gene is most common in people whose families originated in [place]. Other areas of the globe where sickle cell trait occurs include the [place], [place], the [place], and the [place]. What are the signs or symptoms?  Pain, especially in the extremities, back, chest, or abdomen (common). The pain may start suddenly or may develop following an illness, especially if there is dehydration. Pain can also occur due to overexertion or exposure to extreme temperature changes.  Frequent severe bacterial infections, especially certain types of pneumonia and meningitis.  Pain and swelling in the hands and feet.  Decreased activity.  Loss of appetite.  Change in  behavior.  Headaches.  Seizures.  Shortness of breath or difficulty breathing.  Vision changes.  Skin ulcers. Those with the trait may not have symptoms or they may have mild symptoms. How is this diagnosed? Sickle cell anemia is diagnosed with blood tests that demonstrate the genetic trait. It is often diagnosed during the newborn period, due to mandatory testing nationwide. A variety of blood tests, X-rays, CT scans, MRI scans,  ultrasounds, and lung function tests may also be done to monitor the condition. How is this treated? Sickle cell anemia may be treated with:  Medicines. You may be given pain medicines, antibiotic medicines (to treat and prevent infections) or medicines to increase the production of certain types of hemoglobin.  Fluids.  Oxygen.  Blood transfusions.  Follow these instructions at home:  Drink enough fluid to keep your urine clear or pale yellow. Increase your fluid intake in hot weather and during exercise.  Do not smoke. Smoking lowers oxygen levels in the blood.  Only take over-the-counter or prescription medicines for pain, fever, or discomfort as directed by your health care provider.  Take antibiotics as directed by your health care provider. Make sure you finish them it even if you start to feel better.  Take supplements as directed by your health care provider.  Consider wearing a medical alert bracelet. This tells anyone caring for you in an emergency of your condition.  When traveling, keep your medical information, health care provider's names, and the medicines you take with you at all times.  If you develop a fever, do not take medicines to reduce the fever right away. This could cover up a problem that is developing. Notify your health care provider.  Keep all follow-up appointments with your health care provider. Sickle cell anemia requires regular medical care. Contact a health care provider if: You have a fever. Get help right away if:  You feel dizzy or faint.  You have new abdominal pain, especially on the left side near the stomach area.  You develop a persistent, often uncomfortable and painful penile erection (priapism). If this is not treated immediately it will lead to impotence.  You have numbness your arms or legs or you have a hard time moving them.  You have a hard time with speech.  You have a fever or persistent symptoms for more than 2-3  days.  You have a fever and your symptoms suddenly get worse.  You have signs or symptoms of infection. These include: ? Chills. ? Abnormal tiredness (lethargy). ? Irritability. ? Poor eating. ? Vomiting.  You develop pain that is not helped with medicine.  You develop shortness of breath.  You have pain in your chest.  You are coughing up pus-like or bloody sputum.  You develop a stiff neck.  Your feet or hands swell or have pain.  Your abdomen appears bloated.  You develop joint pain. This information is not intended to replace advice given to you by your health care provider. Make sure you discuss any questions you have with your health care provider. Document Released: 02/19/2006 Document Revised: 05/31/2016 Document Reviewed: 06/23/2013 Elsevier Interactive Patient Education  2017 [employer].

## 2018-09-02 LAB — CULTURE, BLOOD (ROUTINE X 2)
Culture: NO GROWTH
Specimen Description: ADEQUATE

## 2018-09-06 ENCOUNTER — Other Ambulatory Visit: Payer: Self-pay

## 2018-09-06 ENCOUNTER — Emergency Department (HOSPITAL_COMMUNITY): Payer: Medicaid Other

## 2018-09-06 ENCOUNTER — Inpatient Hospital Stay (HOSPITAL_COMMUNITY)
Admission: EM | Admit: 2018-09-06 | Discharge: 2018-09-11 | DRG: 811 | Disposition: A | Discharge status: Home / Self Care | Payer: Medicaid Other | Attending: Internal Medicine | Admitting: Internal Medicine

## 2018-09-06 ENCOUNTER — Encounter (HOSPITAL_COMMUNITY): Payer: Self-pay | Admitting: Emergency Medicine

## 2018-09-06 DIAGNOSIS — F1190 Opioid use, unspecified, uncomplicated: Secondary | ICD-10-CM | POA: Diagnosis not present

## 2018-09-06 DIAGNOSIS — J189 Pneumonia, unspecified organism: Secondary | ICD-10-CM | POA: Diagnosis present

## 2018-09-06 DIAGNOSIS — R7989 Other specified abnormal findings of blood chemistry: Secondary | ICD-10-CM | POA: Diagnosis not present

## 2018-09-06 DIAGNOSIS — Y95 Nosocomial condition: Secondary | ICD-10-CM | POA: Diagnosis present

## 2018-09-06 DIAGNOSIS — D5700 Hb-SS disease with crisis, unspecified: Secondary | ICD-10-CM | POA: Diagnosis not present

## 2018-09-06 DIAGNOSIS — R5081 Fever presenting with conditions classified elsewhere: Secondary | ICD-10-CM | POA: Diagnosis not present

## 2018-09-06 DIAGNOSIS — J90 Pleural effusion, not elsewhere classified: Secondary | ICD-10-CM | POA: Diagnosis not present

## 2018-09-06 DIAGNOSIS — D571 Sickle-cell disease without crisis: Secondary | ICD-10-CM | POA: Diagnosis not present

## 2018-09-06 DIAGNOSIS — Z79899 Other long term (current) drug therapy: Secondary | ICD-10-CM

## 2018-09-06 DIAGNOSIS — G894 Chronic pain syndrome: Secondary | ICD-10-CM | POA: Diagnosis present

## 2018-09-06 DIAGNOSIS — J9811 Atelectasis: Secondary | ICD-10-CM | POA: Diagnosis not present

## 2018-09-06 DIAGNOSIS — R509 Fever, unspecified: Secondary | ICD-10-CM

## 2018-09-06 DIAGNOSIS — D72829 Elevated white blood cell count, unspecified: Secondary | ICD-10-CM | POA: Diagnosis not present

## 2018-09-06 DIAGNOSIS — J4520 Mild intermittent asthma, uncomplicated: Secondary | ICD-10-CM | POA: Diagnosis not present

## 2018-09-06 DIAGNOSIS — J45909 Unspecified asthma, uncomplicated: Secondary | ICD-10-CM | POA: Diagnosis present

## 2018-09-06 DIAGNOSIS — Z8249 Family history of ischemic heart disease and other diseases of the circulatory system: Secondary | ICD-10-CM

## 2018-09-06 DIAGNOSIS — R0989 Other specified symptoms and signs involving the circulatory and respiratory systems: Secondary | ICD-10-CM | POA: Diagnosis not present

## 2018-09-06 DIAGNOSIS — Z832 Family history of diseases of the blood and blood-forming organs and certain disorders involving the immune mechanism: Secondary | ICD-10-CM | POA: Diagnosis not present

## 2018-09-06 DIAGNOSIS — D5701 Hb-SS disease with acute chest syndrome: Principal | ICD-10-CM | POA: Diagnosis present

## 2018-09-06 DIAGNOSIS — I10 Essential (primary) hypertension: Secondary | ICD-10-CM | POA: Diagnosis present

## 2018-09-06 LAB — CBC WITH DIFFERENTIAL/PLATELET
Abs Immature Granulocytes: 0.74 10*3/uL — ABNORMAL HIGH (ref 0.00–0.07)
Basophils Absolute: 0 10*3/uL (ref 0.0–0.1)
Basophils Relative: 0 %
Eosinophils Absolute: 0.4 10*3/uL (ref 0.0–0.5)
Eosinophils Relative: 2 %
HCT: 22.6 % — ABNORMAL LOW (ref 39.0–52.0)
Hemoglobin: 7.8 g/dL — ABNORMAL LOW (ref 13.0–17.0)
Immature Granulocytes: 4 %
Lymphocytes Relative: 43 %
Lymphs Abs: 8.4 10*3/uL — ABNORMAL HIGH (ref 0.7–4.0)
MCH: 31 pg (ref 26.0–34.0)
MCHC: 34.5 g/dL (ref 30.0–36.0)
MCV: 89.7 fL (ref 80.0–100.0)
Monocytes Absolute: 1.4 10*3/uL — ABNORMAL HIGH (ref 0.1–1.0)
Monocytes Relative: 8 %
Neutro Abs: 8.4 10*3/uL — ABNORMAL HIGH (ref 1.7–7.7)
Neutrophils Relative %: 43 %
Platelets: 698 10*3/uL — ABNORMAL HIGH (ref 150–400)
RBC: 2.52 MIL/uL — ABNORMAL LOW (ref 4.22–5.81)
RDW: 20.3 % — ABNORMAL HIGH (ref 11.5–15.5)
WBC: 19.3 10*3/uL — ABNORMAL HIGH (ref 4.0–10.5)
nRBC: 0.5 % — ABNORMAL HIGH (ref 0.0–0.2)

## 2018-09-06 LAB — COMPREHENSIVE METABOLIC PANEL
ALT: 27 U/L (ref 0–44)
AST: 42 U/L — ABNORMAL HIGH (ref 15–41)
Albumin: 3.9 g/dL (ref 3.5–5.0)
Alkaline Phosphatase: 83 U/L (ref 38–126)
Anion gap: 10 (ref 5–15)
BUN: 11 mg/dL (ref 6–20)
CO2: 25 mmol/L (ref 22–32)
Calcium: 9.3 mg/dL (ref 8.9–10.3)
Chloride: 105 mmol/L (ref 98–111)
Creatinine, Ser: 0.64 mg/dL (ref 0.61–1.24)
GFR calc Af Amer: >60 mL/min (ref >60)
GFR calc non Af Amer: >60 mL/min (ref >60)
Glucose, Bld: 114 mg/dL — ABNORMAL HIGH (ref 70–99)
Potassium: 4.6 mmol/L (ref 3.5–5.1)
Sodium: 140 mmol/L (ref 135–145)
Total Bilirubin: 2.3 mg/dL — ABNORMAL HIGH (ref 0.3–1.2)
Total Protein: 7.7 g/dL (ref 6.5–8.1)

## 2018-09-06 LAB — RETICULOCYTES
Immature Retic Fract: 36.4 % — ABNORMAL HIGH (ref 2.3–15.9)
RBC.: 2.52 MIL/uL — ABNORMAL LOW (ref 4.22–5.81)
Retic Count, Absolute: 274.6 10*3/uL — ABNORMAL HIGH (ref 19.0–186.0)
Retic Ct Pct: 11.2 % — ABNORMAL HIGH (ref 0.4–3.1)

## 2018-09-06 LAB — TROPONIN I: Troponin I: <0.03 ng/mL (ref <0.03)

## 2018-09-06 MED ORDER — HYDROMORPHONE HCL 2 MG/ML IJ SOLN: 2.0000 mg | INTRAMUSCULAR | Status: DC

## 2018-09-06 MED ORDER — HYDROMORPHONE HCL 1 MG/ML IJ SOLN
1.0000 mg | Freq: Once | INTRAMUSCULAR | Status: AC
  Administered 2018-09-06: 1 mg via INTRAVENOUS
  Filled 2018-09-06: qty 1

## 2018-09-06 MED ORDER — SODIUM CHLORIDE 0.45 % IV SOLN
INTRAVENOUS | Status: DC
  Administered 2018-09-06: 09:00:00 via INTRAVENOUS

## 2018-09-06 MED ORDER — KETOROLAC TROMETHAMINE 30 MG/ML IJ SOLN
15.0000 mg | Freq: Once | INTRAMUSCULAR | Status: AC
  Administered 2018-09-06: 15 mg via INTRAVENOUS
  Filled 2018-09-06: qty 1

## 2018-09-06 MED ORDER — HYDROMORPHONE HCL 1 MG/ML IJ SOLN: 0.5000 mg | INTRAMUSCULAR | Status: DC

## 2018-09-06 MED ORDER — HYDROMORPHONE HCL 1 MG/ML IJ SOLN: 1.0000 mg | INTRAMUSCULAR | Status: DC

## 2018-09-06 MED ORDER — DIPHENHYDRAMINE HCL 50 MG/ML IJ SOLN
25.0000 mg | Freq: Once | INTRAMUSCULAR | Status: AC
  Administered 2018-09-06: 25 mg via INTRAVENOUS
  Filled 2018-09-06: qty 1

## 2018-09-06 MED ORDER — KETOROLAC TROMETHAMINE 30 MG/ML IJ SOLN
30.0000 mg | Freq: Four times a day (QID) | INTRAMUSCULAR | Status: AC
  Administered 2018-09-06 – 2018-09-11 (×17): 30 mg via INTRAVENOUS
  Filled 2018-09-06 (×17): qty 1

## 2018-09-06 MED ORDER — HYDROMORPHONE HCL 1 MG/ML IJ SOLN
1.0000 mg | INTRAMUSCULAR | Status: AC
  Administered 2018-09-06: 1 mg via INTRAVENOUS

## 2018-09-06 MED ORDER — HYDROMORPHONE HCL 1 MG/ML IJ SOLN
0.5000 mg | Freq: Once | INTRAMUSCULAR | Status: AC
  Administered 2018-09-06: 0.5 mg via SUBCUTANEOUS
  Filled 2018-09-06: qty 1

## 2018-09-06 MED ORDER — HYDROMORPHONE BOLUS VIA INFUSION
2.0000 mg | Freq: Once | INTRAVENOUS | Status: AC
  Administered 2018-09-06: 2 mg via INTRAVENOUS
  Filled 2018-09-06: qty 2

## 2018-09-06 MED ORDER — KETOROLAC TROMETHAMINE 30 MG/ML IJ SOLN
30.0000 mg | INTRAMUSCULAR | Status: AC
  Administered 2018-09-06: 30 mg via INTRAVENOUS
  Filled 2018-09-06: qty 1

## 2018-09-06 MED ORDER — HYDROMORPHONE HCL 1 MG/ML IJ SOLN: 0.5000 mg | INTRAMUSCULAR | Status: AC

## 2018-09-06 MED ORDER — HYDROMORPHONE HCL 2 MG/ML IJ SOLN
2.0000 mg | INTRAMUSCULAR | Status: AC
  Administered 2018-09-06: 2 mg via INTRAVENOUS

## 2018-09-06 MED ORDER — ENOXAPARIN SODIUM 40 MG/0.4ML ~~LOC~~ SOLN
40.0000 mg | SUBCUTANEOUS | Status: DC
  Administered 2018-09-10: 40 mg via SUBCUTANEOUS
  Filled 2018-09-06 (×3): qty 0.4

## 2018-09-06 MED ORDER — HYDROMORPHONE HCL 2 MG/ML IJ SOLN
2.0000 mg | INTRAMUSCULAR | Status: DC
  Filled 2018-09-06: qty 1

## 2018-09-06 MED ORDER — HYDROMORPHONE HCL 1 MG/ML IJ SOLN
0.5000 mg | INTRAMUSCULAR | Status: DC
  Filled 2018-09-06: qty 1

## 2018-09-06 MED ORDER — HYDROXYUREA 500 MG PO CAPS
1500.0000 mg | ORAL_CAPSULE | Freq: Every day | ORAL | Status: DC
  Administered 2018-09-06 – 2018-09-11 (×6): 1500 mg via ORAL
  Filled 2018-09-06 (×6): qty 3

## 2018-09-06 MED ORDER — DIPHENHYDRAMINE HCL 50 MG/ML IJ SOLN: 12.5000 mg | Freq: Four times a day (QID) | INTRAMUSCULAR | Status: DC | PRN

## 2018-09-06 MED ORDER — POLYETHYLENE GLYCOL 3350 17 G PO PACK
17.0000 g | PACK | Freq: Every day | ORAL | Status: DC | PRN
  Administered 2018-09-09: 17 g via ORAL
  Filled 2018-09-06: qty 1

## 2018-09-06 MED ORDER — LISINOPRIL 10 MG PO TABS
10.0000 mg | ORAL_TABLET | Freq: Every day | ORAL | Status: DC
  Administered 2018-09-06 – 2018-09-11 (×6): 10 mg via ORAL
  Filled 2018-09-06 (×6): qty 1

## 2018-09-06 MED ORDER — HYDROMORPHONE 1 MG/ML IV SOLN
INTRAVENOUS | Status: DC
  Administered 2018-09-06: 4.5 mg via INTRAVENOUS
  Administered 2018-09-06: 5.5 mg via INTRAVENOUS
  Administered 2018-09-07: 4 mg via INTRAVENOUS
  Administered 2018-09-07: 4.5 mg via INTRAVENOUS
  Administered 2018-09-07 (×2): via INTRAVENOUS
  Administered 2018-09-07: 6.5 mg via INTRAVENOUS
  Administered 2018-09-07: 6 mg via INTRAVENOUS
  Administered 2018-09-07: 6.5 mg via INTRAVENOUS
  Administered 2018-09-07: 4.5 mg via INTRAVENOUS
  Administered 2018-09-08: 11.5 mg via INTRAVENOUS
  Administered 2018-09-08: 7 mg via INTRAVENOUS
  Administered 2018-09-08: 18:00:00 via INTRAVENOUS
  Administered 2018-09-08: 3.5 mg via INTRAVENOUS
  Administered 2018-09-08: 0.5 mg via INTRAVENOUS
  Administered 2018-09-09: 5 mg via INTRAVENOUS
  Administered 2018-09-09: 17:00:00 via INTRAVENOUS
  Administered 2018-09-09: 2 mg via INTRAVENOUS
  Administered 2018-09-09: 2.5 mg via INTRAVENOUS
  Administered 2018-09-09: 5.5 mg via INTRAVENOUS
  Administered 2018-09-09: 2 mg via INTRAVENOUS
  Administered 2018-09-10 (×2): 0.5 mg via INTRAVENOUS
  Filled 2018-09-06 (×5): qty 25

## 2018-09-06 MED ORDER — DEXTROSE-NACL 5-0.45 % IV SOLN
INTRAVENOUS | Status: DC
  Administered 2018-09-06: 22:00:00 via INTRAVENOUS
  Administered 2018-09-06 – 2018-09-07 (×2): 1000 mL via INTRAVENOUS
  Administered 2018-09-08 – 2018-09-11 (×8): via INTRAVENOUS

## 2018-09-06 MED ORDER — SENNOSIDES-DOCUSATE SODIUM 8.6-50 MG PO TABS
1.0000 | ORAL_TABLET | Freq: Two times a day (BID) | ORAL | Status: DC
  Administered 2018-09-06 – 2018-09-11 (×9): 1 via ORAL
  Filled 2018-09-06 (×10): qty 1

## 2018-09-06 MED ORDER — HYDROXYUREA 300 MG PO CAPS: 1600.0000 mg | ORAL_CAPSULE | Freq: Every day | ORAL | Status: DC

## 2018-09-06 MED ORDER — AMOXICILLIN-POT CLAVULANATE 875-125 MG PO TABS
1.0000 | ORAL_TABLET | Freq: Two times a day (BID) | ORAL | Status: DC
  Administered 2018-09-06 – 2018-09-07 (×3): 1 via ORAL
  Filled 2018-09-06 (×3): qty 1

## 2018-09-06 MED ORDER — ONDANSETRON HCL 4 MG/2ML IJ SOLN
4.0000 mg | INTRAMUSCULAR | Status: DC | PRN
  Filled 2018-09-06: qty 2

## 2018-09-06 MED ORDER — SODIUM CHLORIDE 0.9% FLUSH: 9.0000 mL | INTRAVENOUS | Status: DC | PRN

## 2018-09-06 MED ORDER — HYDROMORPHONE HCL 2 MG/ML IJ SOLN: 2.0000 mg | INTRAMUSCULAR | Status: AC

## 2018-09-06 MED ORDER — SODIUM CHLORIDE 0.45 % IV BOLUS
1000.0000 mL | Freq: Once | INTRAVENOUS | Status: AC
  Administered 2018-09-06: 1000 mL via INTRAVENOUS

## 2018-09-06 MED ORDER — ONDANSETRON HCL 4 MG/2ML IJ SOLN: 4.0000 mg | Freq: Four times a day (QID) | INTRAMUSCULAR | Status: DC | PRN

## 2018-09-06 MED ORDER — DIPHENHYDRAMINE HCL 12.5 MG/5ML PO ELIX: 12.5000 mg | ORAL_SOLUTION | Freq: Four times a day (QID) | ORAL | Status: DC | PRN

## 2018-09-06 MED ORDER — NALOXONE HCL 0.4 MG/ML IJ SOLN: 0.4000 mg | INTRAMUSCULAR | Status: DC | PRN

## 2018-09-06 NOTE — H&P (Signed)
[name] Hill is an 17 y.o. male.    Chief Complaint: Pain in back and legs  HPI: Patient is an 17 year old gentleman admitted with significant pain in his back and legs that is going on for 3 days.  Patient has known sickle cell disease and has been taking oral medications at home but has been controlling his pain until recently.  He denied any shortness of breath or cough denied any fever no nausea vomiting no diarrhea.  Pain is rated as 10 out of 10 mainly in his back and his legs.  He was recently admitted with acute chest syndrome and was treated and discharged home.  He is still taking oral antibiotics for his acute chest syndrome.  Symptoms have not completely resolved and now he started having pain that is worst.  No difference in his breathing.  No new evidence of acute chest syndrome.  He has received up to 6 mg of Dilaudid IV in the ER but pain has persisted.  Patient is therefore being admitted to the hospital for pain management of sickle cell crisis.  Past Medical History:  Diagnosis Date  . 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 Blurred vision   . HCAP (healthcare-associated pneumonia) 08/27/2018  . Sickle cell anemia (HCC)   . Sickle cell crisis ([place])   . Sickle cell nephropathy (HCC)     Past Surgical History:  Procedure Laterality Date  . CIRCUMCISION    . TONSILLECTOMY    . TONSILLECTOMY AND ADENOIDECTOMY      Family History  Problem Relation Age of Onset  . Sickle cell anemia Brother   . Hypertension Maternal Grandmother   . Hyperlipidemia Maternal Grandmother   . Anemia Mother    Social History:  reports that he has never smoked. He has never used smokeless tobacco. He reports that he does not drink alcohol or use drugs.  Allergies:  Allergies  Allergen Reactions  . Other Cough    Seasonal allergies.     (Not in a hospital admission)  Results for orders placed or performed during the  hospital encounter of 09/06/18 (from the past 48 hour(s))  Comprehensive metabolic panel     Status: Abnormal   Collection Time: 09/06/18  4:37 AM  Result Value Ref Range   Sodium 140 135 - 145 mmol/L   Potassium 4.6 3.5 - 5.1 mmol/L   Chloride 105 98 - 111 mmol/L   CO2 25 22 - 32 mmol/L   Glucose, Bld 114 (H) 70 - 99 mg/dL   BUN 11 6 - 20 mg/dL   Creatinine, Ser 0.64 0.61 - 1.24 mg/dL   Calcium 9.3 8.9 - 10.3 mg/dL   Total Protein 7.7 6.5 - 8.1 g/dL   Albumin 3.9 3.5 - 5.0 g/dL   AST 42 (H) 15 - 41 U/L   ALT 27 0 - 44 U/L   Alkaline Phosphatase 83 38 - 126 U/L   Total Bilirubin 2.3 (H) 0.3 - 1.2 mg/dL   GFR calc non Af Amer >60 >60 mL/min   GFR calc Af Amer >60 >60 mL/min    Comment: (NOTE) The eGFR has been calculated using the CKD EPI equation. This calculation has not been validated in all clinical situations. eGFR's persistently <60 mL/min signify possible Chronic Kidney Disease.    Anion gap 10 5 - 15    Comment: Performed at [facility], [address]  [place], [place] [address]  CBC with Differential     Status: Abnormal   Collection Time: 09/06/18  4:37 AM  Result Value Ref Range   WBC 19.3 (H) 4.0 - 10.5 K/uL   RBC 2.52 (L) 4.22 - 5.81 MIL/uL   Hemoglobin 7.8 (L) 13.0 - 17.0 g/dL   HCT 22.6 (L) 39.0 - 52.0 %   MCV 89.7 80.0 - 100.0 fL   MCH 31.0 26.0 - 34.0 pg   MCHC 34.5 30.0 - 36.0 g/dL   RDW 20.3 (H) 11.5 - 15.5 %   Platelets 698 (H) 150 - 400 K/uL   nRBC 0.5 (H) 0.0 - 0.2 %   Neutrophils Relative % 43 %   Neutro Abs 8.4 (H) 1.7 - 7.7 K/uL   Lymphocytes Relative 43 %   Lymphs Abs 8.4 (H) 0.7 - 4.0 K/uL   Monocytes Relative 8 %   Monocytes Absolute 1.4 (H) 0.1 - 1.0 K/uL   Eosinophils Relative 2 %   Eosinophils Absolute 0.4 0.0 - 0.5 K/uL   Basophils Relative 0 %   Basophils Absolute 0.0 0.0 - 0.1 K/uL   Immature Granulocytes 4 %   Abs Immature Granulocytes 0.74 (H) 0.00 - 0.07 K/uL   Polychromasia PRESENT    Sickle Cells PRESENT      Comment: Performed at [facility], [place] [address], [place], [place] [address]  Reticulocytes     Status: Abnormal   Collection Time: 09/06/18  4:37 AM  Result Value Ref Range   Retic Ct Pct 11.2 (H) 0.4 - 3.1 %   RBC. 2.52 (L) 4.22 - 5.81 MIL/uL   Retic Count, Absolute 274.6 (H) 19.0 - 186.0 K/uL   Immature Retic Fract 36.4 (H) 2.3 - 15.9 %    Comment: Performed at [facility], [place] [address], [place], [place] [address]  Troponin I     Status: None   Collection Time: 09/06/18  8:49 AM  Result Value Ref Range   Troponin I <0.03 <0.03 ng/mL    Comment: Performed at [facility], [place] [address] [place],  [address]   Dg Chest 2 View  Result Date: 09/06/2018 CLINICAL DATA:  Diffuse pain, sickle cell crisis EXAM: CHEST - 2 VIEW COMPARISON:  08/27/2018 FINDINGS: Cardiac shadow is mildly prominent but stable. The lungs are well aerated bilaterally. No focal infiltrate or sizable effusion is seen. Changes are noted in the thoracic spine consistent with the known history of sickle cell. IMPRESSION: No acute abnormality noted. Electronically Signed   By: Inez Catalina M.D.   On: 09/06/2018 10:01    Review of Systems  Constitutional: Negative.   HENT: Negative.   Eyes: Negative.   Cardiovascular: Negative.   Gastrointestinal: Negative.   Genitourinary: Negative.   Musculoskeletal: Positive for back pain and myalgias.  Skin: Negative.   Neurological: Negative.   Endo/Heme/Allergies: Negative.   Psychiatric/Behavioral: Negative.     Blood pressure (!) 148/98, pulse 94, temperature 97.9 F (36.6 C), temperature source Oral, resp. rate (!) 21, height 5' 6"  (1.676 m), weight 83.5 kg, SpO2 97 %. Physical Exam   Assessment/Plan An 17 year old gentleman admitted with sickle cell painful crisis.  #1 sickle cell painful crisis: Patient will be admitted and started on IV Dilaudid PCA with Toradol and IV fluids.  We will place  him on D5 half-normal at 125 cc an hour.  Titrate his PCA until resolution of symptoms.  #2 recent acute chest syndrome: Patient has recovered from that.  Still has oral antibiotics for healthcare associated pneumonia treatment.  Will complete antibiotic therapy.  #3 history of asthma: No evidence of exacerbation at the moment.  Continue close monitoring.  #4 anemia of chronic disease: Hemoglobin appears stable.  Continue monitoring.  Barbette Merino, MD 09/06/2018, 10:20 AM

## 2018-09-06 NOTE — ED Provider Notes (Signed)
[facility]-EMERGENCY DEPT Provider Note   CSN: [id number] Arrival date & time: 09/06/18  0411     History   Chief Complaint Chief Complaint  Patient presents with  . Sickle Cell Pain Crisis    HPI DUFF POZZI is a 17 y.o. male.  HPI   MANRAJ YEO is a 17 y.o. male, with a history of sickle cell anemia, presenting to the ED with pain "all over."  Some the history is provided by patient and some is provided by his girlfriend and grandmother at the bedside. Pain began last night in the left thigh and this morning progressed to global pain.  Patient states he takes 5 mg oxycodone with last dose approximately 1 AM.  Patient was admitted at the beginning of October for acute chest syndrome.  He was discharged October 6 with Augmentin.  Patient denies shortness of breath, cough, fever, N/V/D, rash, urinary symptoms, or any other complaints.   Past Medical History:  Diagnosis Date  . 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 Blurred vision   . HCAP (healthcare-associated pneumonia) 08/27/2018  . Sickle cell anemia (HCC)   . Sickle cell crisis (HCC)   . Sickle cell nephropathy [facility])     Patient Active Problem List   Diagnosis Date Noted  . Sickle cell anemia with crisis (HCC) 09/06/2018  . Generalized abdominal pain   . Abnormal CT of the abdomen   . Sickle cell crisis acute chest syndrome (HCC) 08/27/2018  . Hypertension 08/27/2018  . HCAP (healthcare-associated pneumonia) 08/27/2018  . Acute chest pain   . Acute chest syndrome (HCC) 05/16/2018  . Leukocytosis   . Transaminitis   . 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 Family circumstance 07/03/2016  . Hypoxia   . 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 Asthma 06/03/2012  . Abnormal presence of protein in urine 06/03/2012  . Sickle cell pain crisis (HCC) 04/21/2012    Past Surgical History:    Procedure Laterality Date  . CIRCUMCISION    . TONSILLECTOMY    . TONSILLECTOMY AND ADENOIDECTOMY          Home Medications    Prior to Admission medications   Medication Sig Start Date End Date Taking? Authorizing Provider  acetaminophen (TYLENOL) 650 MG CR tablet Take 1,300 mg by mouth every 8 (eight) hours as needed for pain.   Yes [provider]  amoxicillin-clavulanate (AUGMENTIN) 875-125 MG tablet Take 1 tablet by mouth every 12 (twelve) hours for 7 days. 08/30/18 09/06/18 Yes Quentin Angst, MD  hydroxyurea (DROXIA) 400 MG capsule Take 4 capsules (1,600 mg total) by mouth daily. 10/05/16  Yes [name], MD  lisinopril (PRINIVIL,ZESTRIL) 10 MG tablet Take 1 tablet (10 mg total) by mouth daily. 10/05/16  Yes [name], MD  oxyCODONE (OXY IR/ROXICODONE) 5 MG immediate release tablet Take 1 tablet (5 mg total) by mouth every 6 (six) hours as needed for severe pain. 08/26/18  Yes Kallie Locks, FNP  albuterol (PROVENTIL HFA;VENTOLIN HFA) 108 (90 Base) MCG/ACT inhaler Inhale 2 puffs into the lungs every 4 (four) hours as needed for wheezing or shortness of breath. Patient not taking: Reported on 08/27/2018 10/05/16   [name], MD    Family History Family History  Problem Relation Age of Onset  . Sickle cell anemia Brother   . Hypertension Maternal Grandmother   . Hyperlipidemia Maternal Grandmother   .  Anemia Mother     Social History Social History   Tobacco Use  . Smoking status: Never Smoker  . Smokeless tobacco: Never Used  Substance Use Topics  . Alcohol use: No  . Drug use: No     Allergies   Other   Review of Systems Review of Systems  Constitutional: Negative for chills, diaphoresis and fever.  Respiratory: Negative for shortness of breath.   Cardiovascular: Positive for chest pain. Negative for leg swelling.  Gastrointestinal: Positive for abdominal pain. Negative for diarrhea, nausea and vomiting.   Musculoskeletal: Positive for back pain and myalgias.  Neurological: Negative for weakness and numbness.  All other systems reviewed and are negative.    Physical Exam Updated Vital Signs BP 130/77 (BP Location: Left Arm)   Pulse (!) 105   Temp 97.9 F (36.6 C) (Oral)   Resp (!) 30   Ht 5\' 6"  (1.676 m)   Wt 83.5 kg   SpO2 95%   BMI 29.70 kg/m   Physical Exam  Constitutional: He appears well-developed and well-nourished. He appears distressed (pain).  Patient rolling around on the bed, thrashing about.   HENT:  Head: Normocephalic and atraumatic.  Eyes: Conjunctivae are normal.  Neck: Neck supple.  Cardiovascular: Regular rhythm, normal heart sounds and intact distal pulses. Tachycardia present.  Pulmonary/Chest: Breath sounds normal. Tachypnea noted.  Abdominal: Soft. There is no tenderness. There is no guarding.  Musculoskeletal: He exhibits no edema.  Lymphadenopathy:    He has no cervical adenopathy.  Neurological: He is alert.  Skin: Skin is warm and dry. He is not diaphoretic.  Psychiatric: He has a normal mood and affect. His behavior is normal.  Nursing note and vitals reviewed.    ED Treatments / Results  Labs (all labs ordered are listed, but only abnormal results are displayed) Labs Reviewed  COMPREHENSIVE METABOLIC PANEL - Abnormal; Notable for the following components:      Result Value   Glucose, Bld 114 (*)    AST 42 (*)    Total Bilirubin 2.3 (*)    All other components within normal limits  CBC WITH DIFFERENTIAL/PLATELET - Abnormal; Notable for the following components:   WBC 19.3 (*)    RBC 2.52 (*)    Hemoglobin 7.8 (*)    HCT 22.6 (*)    RDW 20.3 (*)    Platelets 698 (*)    nRBC 0.5 (*)    Neutro Abs 8.4 (*)    Lymphs Abs 8.4 (*)    Monocytes Absolute 1.4 (*)    Abs Immature Granulocytes 0.74 (*)    All other components within normal limits  RETICULOCYTES - Abnormal; Notable for the following components:   Retic Ct Pct 11.2 (*)     RBC. 2.52 (*)    Retic Count, Absolute 274.6 (*)    Immature Retic Fract 36.4 (*)    All other components within normal limits  CULTURE, BLOOD (ROUTINE X 2)  CULTURE, BLOOD (ROUTINE X 2)  TROPONIN I  URINALYSIS, ROUTINE W REFLEX MICROSCOPIC    EKG EKG Interpretation  Date/Time:  Sunday September 06 2018 08:04:07 EDT Ventricular Rate:  86 PR Interval:    QRS Duration: 99 QT Interval:  354 QTC Calculation: 424 R Axis:   72 Text Interpretation:  Sinus rhythm Prolonged PR interval Probable left ventricular hypertrophy Within normal limits for a teen Confirmed by Gerhard Munch [phone]) on 09/06/2018 9:28:26 AM   Radiology Dg Chest 2 View  Result Date: 09/06/2018 CLINICAL DATA:  Diffuse pain, sickle cell crisis EXAM: CHEST - 2 VIEW COMPARISON:  08/27/2018 FINDINGS: Cardiac shadow is mildly prominent but stable. The lungs are well aerated bilaterally. No focal infiltrate or sizable effusion is seen. Changes are noted in the thoracic spine consistent with the known history of sickle cell. IMPRESSION: No acute abnormality noted. Electronically Signed   By: Alcide Clever M.D.   On: 09/06/2018 10:01    Procedures Procedures (including critical care time)  Medications Ordered in ED Medications  ondansetron (ZOFRAN) injection 4 mg (has no administration in time range)  0.45 % sodium chloride infusion ( Intravenous New Bag/Given 09/06/18 0843)  HYDROmorphone (DILAUDID) injection 0.5 mg (0.5 mg Subcutaneous Given 09/06/18 0540)  ketorolac (TORADOL) 30 MG/ML injection 30 mg (30 mg Intravenous Given 09/06/18 0718)  diphenhydrAMINE (BENADRYL) injection 25 mg (25 mg Intravenous Given 09/06/18 0719)  sodium chloride 0.45 % bolus 1,000 mL (0 mLs Intravenous Stopped 09/06/18 0824)  HYDROmorphone (DILAUDID) injection 1 mg (1 mg Intravenous Given 09/06/18 0720)    Or  HYDROmorphone (DILAUDID) injection 0.5 mg ( Subcutaneous See Alternative 09/06/18 0720)  HYDROmorphone (DILAUDID) injection 2 mg (2  mg Intravenous Given 09/06/18 0738)    Or  HYDROmorphone (DILAUDID) injection 2 mg ( Subcutaneous See Alternative 09/06/18 0738)  HYDROmorphone (DILAUDID) injection 1 mg (1 mg Intravenous Given 09/06/18 0917)  ketorolac (TORADOL) 30 MG/ML injection 15 mg (15 mg Intravenous Given 09/06/18 0737)  diphenhydrAMINE (BENADRYL) injection 25 mg (25 mg Intravenous Given 09/06/18 0737)  HYDROmorphone (DILAUDID) injection 1 mg (1 mg Intravenous Given 09/06/18 1102)     Initial Impression / Assessment and Plan / ED Course  I have reviewed the triage vital signs and the nursing notes.  Pertinent labs & imaging results that were available during my care of the patient were reviewed by me and considered in my medical decision making (see chart for details).  Clinical Course as of Sep 06 1106  Wynelle Link Sep 06, 2018  0730 RN, Darl Pikes, states though the IV medications that were just shown to have been given are marked as complete, the IV was found to be nonfunctional by oncoming RN and they suspect patient never actually got the medications ordered. They note a wet area on the bed next to the IV. We will provide additional doses of medication now that his IV is shown to be patent.   [SJ]  0750 Patient's pain is now well controlled.  Patient no longer tachycardic or tachypneic.  Appears relaxed.   [SJ]  [id number] Patient again reassessed.  Pain continues to be well controlled.  Complains of pain in the legs, 6/10.  Pain in the back 3/10.  These areas are typical locations for his sickle cell pain.  Denies chest pain, shortness of breath, abdominal pain, or any additional complaints. Patient somewhat somnolent, but easily rousable. Next dose of dilaudid held due to somnolence and added need for supplemental O2, thought to be due to his somnolence.    [SJ]  0820 Patient noted to have NS running to IV. Spoke with RN to assure this was changed to 1/2NS, as previously ordered. RN immediately stopped NS and switched to 1/2NS.     [SJ]  [id number] Patient requesting more pain medication. He is alert and oriented. No evidence of respiratory depression.    [SJ]  1016 Spoke with Dr. Mikeal Hawthorne, Sickle Cell medicine service. States he will come evaluate the patient for admission.    [SJ]    Clinical Course User Index [SJ] Zayra Devito C,  PA-C    Patient presents with global pain.  Nontoxic-appearing.  Initially mildly tachycardic and tachypneic, resolved with pain management.  Patient does have a leukocytosis, some decrease in hemoglobin, increase in reticulocyte count percentage, and increase in platelet count.  Repeat abdominal and chest exams benign, however, due to the patient's recent admission for acute chest syndrome and his presentation today with persistent pain and lab abnormalities, I think it prudent to admit the patient for continued evaluation and management.    Findings and plan of care discussed with April Palumbo, MD and then with Dr. Jeraldine Loots after EDP shift change.   Vitals:   09/06/18 0830 09/06/18 0900 09/06/18 1030 09/06/18 1039  BP: 125/74 (!) 148/98 132/82 132/82  Pulse: 93 94 77 84  Resp: (!) 21  (!) 25 16  Temp:      TempSrc:      SpO2: 96% 97% 97% 96%  Weight:      Height:         Final Clinical Impressions(s) / ED Diagnoses   Final diagnoses:  None    ED Discharge Orders    None       Concepcion Living 09/06/18 1111    Palumbo, April, MD 09/06/18 2322

## 2018-09-06 NOTE — ED Triage Notes (Signed)
Patient is complaining of pain all over. Patient states his medication is not working. Patient states he is in a crisis.

## 2018-09-06 NOTE — ED Notes (Signed)
Pt and family are aware that a urine specimen is needed.

## 2018-09-06 NOTE — ED Notes (Signed)
Pt and family aware we are waiting for admitting to come see pt.

## 2018-09-06 NOTE — ED Notes (Signed)
To x-ray

## 2018-09-06 NOTE — ED Notes (Signed)
Pt upset with prior events on night shift, writer checked and secured IV then obtained order to repeat meds. Pt resting more comfortably, family verbalizes they feel better now pt is resting. Continue to monitor pt.

## 2018-09-06 NOTE — ED Notes (Signed)
Meds for pain held due to pt resting at present time. VS updated. Will monitor status

## 2018-09-06 NOTE — Progress Notes (Signed)
Patient's mother called on call provider line @[phone]  am upset. Mother states that her son is in extreme pain and is at [facility]. Mother states that [name] Hill has not been seen or evaluated since being at the ED for 2 hours and  states that patient was given IM Dilaudid without relief. She is requesting IVF and Dilaudid PCA. Discussed that patient is 60 and will need to give permission for discussion of his care. This provider called [facility] and spo ke with Darl Pikes, charge RN to inquire about patient course. She advised that protocol was being followed for this patient and that the PA has placed orders appropriately.  [name] Hill L. Riley Lam, FNP-BC

## 2018-09-07 DIAGNOSIS — D5700 Hb-SS disease with crisis, unspecified: Secondary | ICD-10-CM

## 2018-09-07 DIAGNOSIS — J4520 Mild intermittent asthma, uncomplicated: Secondary | ICD-10-CM

## 2018-09-07 LAB — CBC WITH DIFFERENTIAL/PLATELET
Abs Immature Granulocytes: 0.32 K/uL — ABNORMAL HIGH (ref 0.00–0.07)
Basophils Absolute: 0.1 K/uL (ref 0.0–0.1)
Basophils Relative: 0 %
Eosinophils Absolute: 0 K/uL (ref 0.0–0.5)
Eosinophils Relative: 0 %
HCT: 21.8 % — ABNORMAL LOW (ref 39.0–52.0)
Hemoglobin: 7.6 g/dL — ABNORMAL LOW (ref 13.0–17.0)
Immature Granulocytes: 1 %
Lymphocytes Relative: 20 %
Lymphs Abs: 4.5 K/uL — ABNORMAL HIGH (ref 0.7–4.0)
MCH: 30.9 pg (ref 26.0–34.0)
MCHC: 34.9 g/dL (ref 30.0–36.0)
MCV: 88.6 fL (ref 80.0–100.0)
Monocytes Absolute: 2.2 K/uL — ABNORMAL HIGH (ref 0.1–1.0)
Monocytes Relative: 10 %
Neutro Abs: 15 K/uL — ABNORMAL HIGH (ref 1.7–7.7)
Neutrophils Relative %: 69 %
Platelets: 694 K/uL — ABNORMAL HIGH (ref 150–400)
RBC: 2.46 MIL/uL — ABNORMAL LOW (ref 4.22–5.81)
RDW: 20.3 % — ABNORMAL HIGH (ref 11.5–15.5)
WBC: 22.1 K/uL — ABNORMAL HIGH (ref 4.0–10.5)
nRBC: 2.5 % — ABNORMAL HIGH (ref 0.0–0.2)

## 2018-09-07 LAB — COMPREHENSIVE METABOLIC PANEL
ALT: 28 U/L (ref 0–44)
AST: 42 U/L — ABNORMAL HIGH (ref 15–41)
Albumin: 3.9 g/dL (ref 3.5–5.0)
Alkaline Phosphatase: 79 U/L (ref 38–126)
Anion gap: 10 (ref 5–15)
BUN: 10 mg/dL (ref 6–20)
CO2: 26 mmol/L (ref 22–32)
Calcium: 9.1 mg/dL (ref 8.9–10.3)
Chloride: 104 mmol/L (ref 98–111)
Creatinine, Ser: 0.64 mg/dL (ref 0.61–1.24)
GFR calc Af Amer: >60 mL/min (ref >60)
GFR calc non Af Amer: >60 mL/min (ref >60)
Glucose, Bld: 131 mg/dL — ABNORMAL HIGH (ref 70–99)
Potassium: 4.1 mmol/L (ref 3.5–5.1)
Sodium: 140 mmol/L (ref 135–145)
Total Bilirubin: 2.8 mg/dL — ABNORMAL HIGH (ref 0.3–1.2)
Total Protein: 7.5 g/dL (ref 6.5–8.1)

## 2018-09-07 LAB — URINALYSIS, ROUTINE W REFLEX MICROSCOPIC
Bacteria, UA: NONE SEEN
Bilirubin Urine: NEGATIVE
Glucose, UA: NEGATIVE mg/dL
Ketones, ur: NEGATIVE mg/dL
Leukocytes, UA: NEGATIVE
Nitrite: NEGATIVE
Protein, ur: 100 mg/dL — AB
Specific Gravity, Urine: 1.01 (ref 1.005–1.030)
pH: 5 (ref 5.0–8.0)

## 2018-09-07 MED ORDER — ACETAMINOPHEN 325 MG PO TABS
650.0000 mg | ORAL_TABLET | ORAL | Status: DC | PRN
  Administered 2018-09-07 – 2018-09-09 (×4): 650 mg via ORAL
  Filled 2018-09-07 (×4): qty 2

## 2018-09-07 MED ORDER — OXYCODONE HCL 5 MG PO TABS
10.0000 mg | ORAL_TABLET | ORAL | Status: DC | PRN
  Administered 2018-09-07: 10 mg via ORAL
  Administered 2018-09-07: 5 mg via ORAL
  Filled 2018-09-07 (×2): qty 2

## 2018-09-07 MED ORDER — OXYCODONE HCL 5 MG PO TABS
5.0000 mg | ORAL_TABLET | ORAL | Status: DC | PRN
  Administered 2018-09-07: 5 mg via ORAL
  Filled 2018-09-07: qty 1

## 2018-09-07 MED ORDER — SODIUM CHLORIDE 0.9 % IV BOLUS
500.0000 mL | Freq: Once | INTRAVENOUS | Status: AC
  Administered 2018-09-07: 500 mL via INTRAVENOUS

## 2018-09-07 NOTE — Progress Notes (Signed)
Subjective: An 17 year old male with a history of sickle cell anemia, history of acute chest syndrome, and history of chronic pain syndrome was admitted and sickle cell crisis.  Patient reports significant pain primarily to lower back and lower extremities.  Patient attributes current pain crisis to returning to work too soon following previous pain crisis.  He states that he has been taking oral medications at home consistently, but they have not been controlling pain lately.  Patient states that pain intensity has improved to 8/10 overnight. Patient maintaining oxygen saturation on room air.  Patient currently afebrile.  Patient denies headache, chest pain, nausea, dysuria, vomiting, or diarrhea.  Objective:  Vital signs in last 24 hours:  Vitals:   09/07/18 0340 09/07/18 0616 09/07/18 0940 09/07/18 0941  BP:  (!) 141/87 138/90   Pulse:  98    Resp: 16 20  (!) 30  Temp:  99.4 F (37.4 C)    TempSrc:  Oral    SpO2: 97% 95%    Weight:  84.9 kg    Height:        Intake/Output from previous day:   Intake/Output Summary (Last 24 hours) at 09/07/2018 1053 Last data filed at 09/07/2018 0317 Gross per 24 hour  Intake 2759.26 ml  Output 2200 ml  Net 559.26 ml    Physical Exam: General: Alert, awake, oriented x3, in no acute distress.  HEENT: [place]/AT PEERL, EOMI Neck: Trachea midline,  no masses, no thyromegal,y no JVD, no carotid bruit OROPHARYNX:  Moist, No exudate/ erythema/lesions.  Heart: Regular rate and rhythm, without murmurs, rubs, gallops, PMI non-displaced, no heaves or thrills on palpation.  Lungs: Clear to auscultation, no wheezing or rhonchi noted. No increased vocal fremitus resonant to percussion  Abdomen: Soft, nontender, nondistended, positive bowel sounds, no masses no hepatosplenomegaly noted..  Neuro: No focal neurological deficits noted cranial nerves II through XII grossly intact. DTRs 2+ bilaterally upper and lower extremities. Strength 5 out of 5 in bilateral  upper and lower extremities. Musculoskeletal: No warm swelling or erythema around joints, no spinal tenderness noted. Psychiatric: Patient alert and oriented x3, good insight and cognition, good recent to remote recall. Lymph node survey: No cervical axillary or inguinal lymphadenopathy noted.  Lab Results:  Basic Metabolic Panel:    Component Value Date/Time   NA 140 09/07/2018 0530   NA 142 06/26/2018 1416   K 4.1 09/07/2018 0530   CL 104 09/07/2018 0530   CO2 26 09/07/2018 0530   BUN 10 09/07/2018 0530   BUN 7 06/26/2018 1416   CREATININE 0.64 09/07/2018 0530   GLUCOSE 131 (H) 09/07/2018 0530   CALCIUM 9.1 09/07/2018 0530   CBC:    Component Value Date/Time   WBC 22.1 (H) 09/07/2018 0530   HGB 7.6 (L) 09/07/2018 0530   HGB 9.6 (L) 06/26/2018 1416   HCT 21.8 (L) 09/07/2018 0530   HCT 28.5 (L) 06/26/2018 1416   PLT 694 (H) 09/07/2018 0530   PLT 405 06/26/2018 1416   MCV 88.6 09/07/2018 0530   MCV 92 06/26/2018 1416   NEUTROABS 15.0 (H) 09/07/2018 0530   NEUTROABS 3.7 06/26/2018 1416   LYMPHSABS 4.5 (H) 09/07/2018 0530   LYMPHSABS 3.3 (H) 06/26/2018 1416   MONOABS 2.2 (H) 09/07/2018 0530   EOSABS 0.0 09/07/2018 0530   EOSABS 0.2 06/26/2018 1416   BASOSABS 0.1 09/07/2018 0530   BASOSABS 0.1 06/26/2018 1416    Recent Results (from the past 240 hour(s))  MRSA PCR Screening     Status:  None   Collection Time: 08/28/18 12:30 PM  Result Value Ref Range Status   MRSA by PCR NEGATIVE NEGATIVE Final    Comment:        The GeneXpert MRSA Assay (FDA approved for NASAL specimens only), is one component of a comprehensive MRSA colonization surveillance program. It is not intended to diagnose MRSA infection nor to guide or monitor treatment for MRSA infections. Performed at [facility], [address] [place], [place] [address]     Studies/Results: Dg Chest 2 View  Result Date: 09/06/2018 CLINICAL DATA:  Diffuse pain, sickle cell crisis EXAM:  CHEST - 2 VIEW COMPARISON:  08/27/2018 FINDINGS: Cardiac shadow is mildly prominent but stable. The lungs are well aerated bilaterally. No focal infiltrate or sizable effusion is seen. Changes are noted in the thoracic spine consistent with the known history of sickle cell. IMPRESSION: No acute abnormality noted. Electronically Signed   By: Alcide Clever M.D.   On: 09/06/2018 10:01    Medications: Scheduled Meds: . amoxicillin-clavulanate  1 tablet Oral Q12H  . enoxaparin (LOVENOX) injection  40 mg Subcutaneous Q24H  . HYDROmorphone   Intravenous Q4H  . hydroxyurea  1,500 mg Oral Daily  . ketorolac  30 mg Intravenous Q6H  . lisinopril  10 mg Oral Daily  . senna-docusate  1 tablet Oral BID   Continuous Infusions: . sodium chloride Stopped (09/06/18 1305)  . dextrose 5 % and 0.45% NaCl Stopped (09/07/18 0315)   PRN Meds:.diphenhydrAMINE **OR** diphenhydrAMINE, naloxone **AND** sodium chloride flush, ondansetron (ZOFRAN) IV, oxyCODONE, polyethylene glycol   Assessment/Plan: Principal Problem:   Hb-SS disease with vaso-occlusive crisis (HCC) Active Problems:   Asthma   Leukocytosis   Sickle cell anemia with crisis (HCC)   Sickle cell anemia with pain crisis: Continue IV Dilaudid PCA per weight-based protocol Continue Toradol 30 mg IV every 6 hours as needed Continue hypotonic IV fluids Wean PCA as pain symptoms resolve Oxycodone 10 mg every 4 hours as needed for moderate to severe breakthrough pain Continue to monitor pain closely in the context of function Maintain oxygen saturation above 90%  History of acute chest syndrome: Chest x-ray this admission does not show an acute cardiopulmonary process.  Patient is still completing oral antibiotic therapy for previous healthcare associated pneumonia treatment.  Patient to complete antibiotic therapy.  Leukocytosis: WBC count 22,000, which is increased from baseline.  Patient currently afebrile.  Continue to monitor closely. CBC in  a.m.  Sickle cell anemia: Continue folic acid and hydroxyurea Hemoglobin 7.6, which is decreased from baseline.  Continue to monitor closely. CBC in a.m.  History of asthma: Continue MDIs as needed Code Status: Full Code Fami ly Communication: N/A Disposition Plan: Not yet ready for discharge  Esau Fridman Rennis Petty  APRN, MSN, FNP-C Patient Care Center [facility] [address] [place], [place] [address] [phone]  If 7PM-7AM, please contact night-coverage.  09/07/2018, 10:53 AM  LOS: 1 day

## 2018-09-08 ENCOUNTER — Inpatient Hospital Stay (HOSPITAL_COMMUNITY): Payer: Medicaid Other

## 2018-09-08 DIAGNOSIS — D5701 Hb-SS disease with acute chest syndrome: Principal | ICD-10-CM

## 2018-09-08 DIAGNOSIS — R5081 Fever presenting with conditions classified elsewhere: Secondary | ICD-10-CM

## 2018-09-08 DIAGNOSIS — R509 Fever, unspecified: Secondary | ICD-10-CM

## 2018-09-08 DIAGNOSIS — D72829 Elevated white blood cell count, unspecified: Secondary | ICD-10-CM

## 2018-09-08 LAB — CBC
HCT: 20.6 % — ABNORMAL LOW (ref 39.0–52.0)
Hemoglobin: 7.1 g/dL — ABNORMAL LOW (ref 13.0–17.0)
MCH: 30.3 pg (ref 26.0–34.0)
MCHC: 34.5 g/dL (ref 30.0–36.0)
MCV: 88 fL (ref 80.0–100.0)
Platelets: 633 10*3/uL — ABNORMAL HIGH (ref 150–400)
RBC: 2.34 MIL/uL — ABNORMAL LOW (ref 4.22–5.81)
RDW: 19 % — ABNORMAL HIGH (ref 11.5–15.5)
WBC: 20.5 10*3/uL — ABNORMAL HIGH (ref 4.0–10.5)
nRBC: 3.2 % — ABNORMAL HIGH (ref 0.0–0.2)

## 2018-09-08 LAB — ABO/RH: ABO/RH(D): O POS

## 2018-09-08 LAB — PREPARE RBC (CROSSMATCH)

## 2018-09-08 MED ORDER — ACETAMINOPHEN 325 MG PO TABS
650.0000 mg | ORAL_TABLET | Freq: Once | ORAL | Status: AC
  Administered 2018-09-08: 650 mg via ORAL
  Filled 2018-09-08: qty 2

## 2018-09-08 MED ORDER — DIPHENHYDRAMINE HCL 25 MG PO CAPS
25.0000 mg | ORAL_CAPSULE | Freq: Once | ORAL | Status: AC
  Administered 2018-09-08: 25 mg via ORAL
  Filled 2018-09-08: qty 1

## 2018-09-08 MED ORDER — SODIUM CHLORIDE 0.9 % IV SOLN
1.0000 g | INTRAVENOUS | Status: DC
  Administered 2018-09-08 – 2018-09-11 (×4): 1 g via INTRAVENOUS
  Filled 2018-09-08 (×4): qty 1

## 2018-09-08 MED ORDER — SODIUM CHLORIDE 0.9% IV SOLUTION
Freq: Once | INTRAVENOUS | Status: AC
  Administered 2018-09-08: 18:00:00 via INTRAVENOUS

## 2018-09-08 MED ORDER — VANCOMYCIN HCL 10 G IV SOLR
1750.0000 mg | Freq: Once | INTRAVENOUS | Status: AC
  Administered 2018-09-08: 1750 mg via INTRAVENOUS
  Filled 2018-09-08: qty 1750

## 2018-09-08 MED ORDER — VANCOMYCIN HCL IN DEXTROSE 1-5 GM/200ML-% IV SOLN
1000.0000 mg | Freq: Three times a day (TID) | INTRAVENOUS | Status: DC
  Administered 2018-09-08 – 2018-09-10 (×5): 1000 mg via INTRAVENOUS
  Filled 2018-09-08 (×6): qty 200

## 2018-09-08 NOTE — Progress Notes (Signed)
Notified on call physician that patient's temp was 101.8 on pre blood vitals.  Order received to hold blood until patient is afebrile.

## 2018-09-08 NOTE — Progress Notes (Signed)
   09/08/18 1742  MEWS Score  Resp (!) 28  Pulse Rate (!) 118  BP (!) 143/90  Temp (!) 101.8 F (38.8 C)  MEWS RR 2  MEWS Pulse 2  MEWS Systolic 0  MEWS LOC 0  MEWS Temp 2  MEWS Score 6  MEWS Score Color Red   Off going RN notified physician.  Reeves Forth, RN 09/08/18 9:39 PM

## 2018-09-08 NOTE — Progress Notes (Signed)
Pharmacy Antibiotic Note  [name] Hill is a 17 y.o. male admitted on 09/06/2018 with sickle cell crisis.  Pharmacy has been consulted for vancomycin dosing for Acute chest syndrome vs. CAP. 09/08/2018 New focal infiltrate in the right infrahilar lung base. Tmax 102.9, WBC up to 20.5.  He is s/p 7 fulls days of po augmentin after 2 full days IV abx last admission.  Plan: Vancomycin 1750 mg IV loading dose followed by vancomycin 1 gm IV q8h for est AUC 446 (IBW/ABW, SCr 0.65) Rocephin 1 gm IV q24 per MD F/u renal fxn, WBC, temp, culture data Vancomycin levels as needed  Height: 5\' 6"  (167.6 cm) Weight: 187 lb 2.7 oz (84.9 kg) IBW/kg (Calculated) : 63.8  Temp (24hrs), Avg:100.6 F (38.1 C), Min:99.3 F (37.4 C), Max:102.9 F (39.4 C)  Recent Labs  Lab 09/06/18 0437 09/07/18 0530 09/08/18 0404  WBC 19.3* 22.1* 20.5*  CREATININE 0.64 0.64  --     Estimated Creatinine Clearance: 151.7 mL/min (by C-G formula based on SCr of 0.64 mg/dL).    Allergies  Allergen Reactions  . Other Cough    Seasonal allergies.  Antimicrobials this admission: Augmentin 10/6>>10/14 10/15 vanc >> 10/15 CTx>> Dose adjustments this admission:   Microbiology results:  10/15 MRSA PCR> 10/13 BCx2>>sent 10/14 MRSA PCR neg  Thank you for allowing pharmacy to be a part of this patient's care.  Herby Abraham, Pharm.D [phone] 09/08/2018 10:49 AM

## 2018-09-08 NOTE — Progress Notes (Signed)
Subjective: [name] Hill, an  17 year old male with a history of sickle cell anemia, history of acute chest syndrome, and history of chronic pain syndrome was admitted and sickle cell crisis.   Patient reports significant pain primarily to lower back and lower extremities.  Pain intensity 9/10. Patient has been afebrile, maximum temperature 102. Stat chest xray, shows new infiltrate. Oxygen saturation 92% on 2 liters.    Patient denies headache, chest pain, nausea, dysuria, vomiting, or diarrhea.  Objective:  Vital signs in last 24 hours:  Vitals:   09/08/18 0345 09/08/18 0443 09/08/18 0900 09/08/18 0957  BP:  123/76  136/86  Pulse:  (!) 108  (!) 112  Resp: (!) 21 (!) 29  (!) 25  Temp:  (!) 100.6 F (38.1 C) (!) 100.6 F (38.1 C) (!) 102.1 F (38.9 C)  TempSrc:  Oral Oral Oral  SpO2: 91% 92%  94%  Weight:      Height:        Intake/Output from previous day:   Intake/Output Summary (Last 24 hours) at 09/08/2018 1012 Last data filed at 09/07/2018 2226 Gross per 24 hour  Intake 1648.15 ml  Output 725 ml  Net 923.15 ml    Physical Exam: General: Alert, awake, oriented x3, in mild distress.  HEENT: [place]/AT [place]ER[place], EOMI Neck: Trachea midline,  no masses, no thyromegal,y no JVD, no carotid bruit OROPHARYNX:  Moist, No exudate/ erythema/lesions.  Heart: Regular rate and rhythm, without murmurs, rubs, gallops, PMI non-displaced, no heaves or thrills on palpation.  Lungs: Clear to auscultation, no wheezing or rhonchi noted. No increased vocal fremitus resonant to percussion  Abdomen: Soft, nontender, nondistended, positive bowel sounds, no masses no hepatosplenomegaly noted..  Neuro: No focal neurological deficits noted cranial nerves II through XII grossly intact. DTRs 2+ bilaterally upper and lower extremities. Strength 5 out of 5 in bilateral upper and lower extremities. Musculoskeletal: No warm swelling or erythema around joints, no spinal tenderness noted. Psychiatric: Patient  alert and oriented x3, good insight and cognition, good recent to remote recall. Lymph node survey: No cervical axillary or inguinal lymphadenopathy noted.  Lab Results:  Basic Metabolic Panel:    Component Value Date/Time   NA 140 09/07/2018 0530   NA 142 06/26/2018 1416   K 4.1 09/07/2018 0530   CL 104 09/07/2018 0530   CO2 26 09/07/2018 0530   BUN 10 09/07/2018 0530   BUN 7 06/26/2018 1416   CREATININE 0.64 09/07/2018 0530   GLUCOSE 131 (H) 09/07/2018 0530   CALCIUM 9.1 09/07/2018 0530   CBC:    Component Value Date/Time   WBC 20.5 (H) 09/08/2018 0404   HGB 7.1 (L) 09/08/2018 0404   HGB 9.6 (L) 06/26/2018 1416   HCT 20.6 (L) 09/08/2018 0404   HCT 28.5 (L) 06/26/2018 1416   PLT 633 (H) 09/08/2018 0404   PLT 405 06/26/2018 1416   MCV 88.0 09/08/2018 0404   MCV 92 06/26/2018 1416   NEUTROABS 15.0 (H) 09/07/2018 0530   NEUTROABS 3.7 06/26/2018 1416   LYMPHSABS 4.5 (H) 09/07/2018 0530   LYMPHSABS 3.3 (H) 06/26/2018 1416   MONOABS 2.2 (H) 09/07/2018 0530   EOSABS 0.0 09/07/2018 0530   EOSABS 0.2 06/26/2018 1416   BASOSABS 0.1 09/07/2018 0530   BASOSABS 0.1 06/26/2018 1416    No results found for this or any previous visit (from the past 240 hour(s)).  Studies/Results: Dg Chest 2 View  Result Date: 09/08/2018 CLINICAL DATA:  Sickle cell crisis EXAM: CHEST - 2 VIEW COMPARISON:  09/06/2018 FINDINGS: Mild cardiomegaly. Linear atelectasis or scarring at the left lung base. Mild chronic elevation of the right hemidiaphragm is stable. New focal opacity in the right infrahilar region. No effusions. No acute bony abnormality. IMPRESSION: Mild cardiomegaly. New focal infiltrate in the right infrahilar lung base. Stable mild chronic elevation of the right hemidiaphragm. Left base atelectasis or scarring. Electronically Signed   By: Charlett Nose M.D.   On: 09/08/2018 09:53    Medications: Scheduled Meds: . enoxaparin (LOVENOX) injection  40 mg Subcutaneous Q24H  . HYDROmorphone    Intravenous Q4H  . hydroxyurea  1,500 mg Oral Daily  . ketorolac  30 mg Intravenous Q6H  . lisinopril  10 mg Oral Daily  . senna-docusate  1 tablet Oral BID   Continuous Infusions: . cefTRIAXone (ROCEPHIN)  IV    . dextrose 5 % and 0.45% NaCl 125 mL/hr at 09/08/18 0643   PRN Meds:.acetaminophen, diphenhydrAMINE **OR** diphenhydrAMINE, naloxone **AND** sodium chloride flush, ondansetron (ZOFRAN) IV, oxyCODONE, polyethylene glycol   Assessment/Plan: Principal Problem:   Hb-SS disease with vaso-occlusive crisis (HCC) Active Problems:   Asthma   Leukocytosis   Sickle cell anemia with crisis (HCC)  Acute chest syndrome vs. CAP Chest xray shows new infiltrate in the right infrahilar lung base.  Initiate empiric antibiotics Rocephin 1 g every 24 hours  Vancomycin per pharmacy consult Maintain oxygen saturation above 90% Transfuse 1 unit of PRBCs  Sickle cell anemia with pain crisis: Continue IV Dilaudid PCA per weight-based protocol Continue Toradol 30 mg IV every 6 hours as needed Continue hypotonic IV fluids Wean PCA as pain symptoms resolve Oxycodone 10 mg every 4 hours as needed for moderate to severe breakthrough pain Continue to monitor pain closely in the context of function Maintain oxygen saturation above 90%  Leukocytosis: WBC count 22,000, which is increased from baseline.  Maximum temperature 102.2.  Continue to monitor closely. CBC in a.m.  Tachycardia:  Between 110-120 Transfer to telemetry  Sickle cell anemia: Continue folic acid and hydroxyurea Hemoglobin 7.1, which is decreased from baseline.   Type and screen, transfuse 1 unit.  Continue to monitor closely. CBC in a.m.  History of asthma: Continue MDIs as needed  Thrombocytosis: Platelet ct 633, reactive. Continue to monitor closely CBC in am  Code Status: Full Code Fami ly Communication: N/A Disposition Plan: Not yet ready for discharge  Nolon Nations  APRN, MSN, FNP-C Patient Care  Center [facility] [address] [place], [place] [address] [phone]  If 7PM-7AM, please contact night-coverage.  09/08/2018, 10:12 AM  LOS: 2 days

## 2018-09-09 ENCOUNTER — Encounter (HOSPITAL_COMMUNITY): Payer: Self-pay | Admitting: Radiology

## 2018-09-09 ENCOUNTER — Inpatient Hospital Stay (HOSPITAL_COMMUNITY): Payer: Medicaid Other

## 2018-09-09 ENCOUNTER — Ambulatory Visit: Payer: Medicaid Other | Admitting: Family Medicine

## 2018-09-09 LAB — CBC WITH DIFFERENTIAL/PLATELET
Abs Immature Granulocytes: 0.13 10*3/uL — ABNORMAL HIGH (ref 0.00–0.07)
Basophils Absolute: 0.1 10*3/uL (ref 0.0–0.1)
Basophils Relative: 0 %
Eosinophils Absolute: 0 10*3/uL (ref 0.0–0.5)
Eosinophils Relative: 0 %
HCT: 20.8 % — ABNORMAL LOW (ref 39.0–52.0)
Hemoglobin: 7.2 g/dL — ABNORMAL LOW (ref 13.0–17.0)
Immature Granulocytes: 1 %
Lymphocytes Relative: 15 %
Lymphs Abs: 2.8 10*3/uL (ref 0.7–4.0)
MCH: 30.5 pg (ref 26.0–34.0)
MCHC: 34.6 g/dL (ref 30.0–36.0)
MCV: 88.1 fL (ref 80.0–100.0)
Monocytes Absolute: 1.5 10*3/uL — ABNORMAL HIGH (ref 0.1–1.0)
Monocytes Relative: 8 %
Neutro Abs: 14.1 10*3/uL — ABNORMAL HIGH (ref 1.7–7.7)
Neutrophils Relative %: 76 %
Platelets: 669 10*3/uL — ABNORMAL HIGH (ref 150–400)
RBC: 2.36 MIL/uL — ABNORMAL LOW (ref 4.22–5.81)
RDW: 17.9 % — ABNORMAL HIGH (ref 11.5–15.5)
WBC: 18.6 10*3/uL — ABNORMAL HIGH (ref 4.0–10.5)
nRBC: 1.4 % — ABNORMAL HIGH (ref 0.0–0.2)

## 2018-09-09 LAB — URINALYSIS, COMPLETE (UACMP) WITH MICROSCOPIC
Bilirubin Urine: NEGATIVE
Glucose, UA: NEGATIVE mg/dL
Hgb urine dipstick: NEGATIVE
Ketones, ur: NEGATIVE mg/dL
Leukocytes, UA: NEGATIVE
Nitrite: NEGATIVE
Protein, ur: 30 mg/dL — AB
Specific Gravity, Urine: 1.013 (ref 1.005–1.030)
pH: 6 (ref 5.0–8.0)

## 2018-09-09 LAB — COMPREHENSIVE METABOLIC PANEL
ALT: 22 U/L (ref 0–44)
AST: 27 U/L (ref 15–41)
Albumin: 3 g/dL — ABNORMAL LOW (ref 3.5–5.0)
Alkaline Phosphatase: 102 U/L (ref 38–126)
Anion gap: 9 (ref 5–15)
BUN: 6 mg/dL (ref 6–20)
CO2: 25 mmol/L (ref 22–32)
Calcium: 8 mg/dL — ABNORMAL LOW (ref 8.9–10.3)
Chloride: 102 mmol/L (ref 98–111)
Creatinine, Ser: 0.57 mg/dL — ABNORMAL LOW (ref 0.61–1.24)
GFR calc Af Amer: >60 mL/min (ref >60)
GFR calc non Af Amer: >60 mL/min (ref >60)
Glucose, Bld: 132 mg/dL — ABNORMAL HIGH (ref 70–99)
Potassium: 3.4 mmol/L — ABNORMAL LOW (ref 3.5–5.1)
Sodium: 136 mmol/L (ref 135–145)
Total Bilirubin: 3.1 mg/dL — ABNORMAL HIGH (ref 0.3–1.2)
Total Protein: 6.4 g/dL — ABNORMAL LOW (ref 6.5–8.1)

## 2018-09-09 LAB — TRANSFUSION REACTION
DAT C3: NEGATIVE
Post RXN DAT IgG: NEGATIVE

## 2018-09-09 LAB — RETICULOCYTES
Immature Retic Fract: 36.6 % — ABNORMAL HIGH (ref 2.3–15.9)
RBC.: 2.35 MIL/uL — ABNORMAL LOW (ref 4.22–5.81)
Retic Count, Absolute: 157 10*3/uL (ref 19.0–186.0)
Retic Ct Pct: 6.7 % — ABNORMAL HIGH (ref 0.4–3.1)

## 2018-09-09 LAB — PROCALCITONIN: Procalcitonin: 0.13 ng/mL

## 2018-09-09 LAB — LACTATE DEHYDROGENASE: LDH: 688 U/L — ABNORMAL HIGH (ref 98–192)

## 2018-09-09 LAB — PREPARE RBC (CROSSMATCH)

## 2018-09-09 MED ORDER — IOPAMIDOL (ISOVUE-370) INJECTION 76%
100.0000 mL | Freq: Once | INTRAVENOUS | Status: AC | PRN
  Administered 2018-09-09: 50 mL via INTRAVENOUS

## 2018-09-09 MED ORDER — SODIUM CHLORIDE 0.9 % IJ SOLN
INTRAMUSCULAR | Status: AC
  Filled 2018-09-09: qty 50

## 2018-09-09 MED ORDER — DIPHENHYDRAMINE HCL 50 MG/ML IJ SOLN: 25.0000 mg | Freq: Once | INTRAMUSCULAR | Status: DC

## 2018-09-09 MED ORDER — IOPAMIDOL (ISOVUE-370) INJECTION 76%
INTRAVENOUS | Status: AC
  Filled 2018-09-09: qty 100

## 2018-09-09 MED ORDER — METHYLPREDNISOLONE SODIUM SUCC 125 MG IJ SOLR
125.0000 mg | Freq: Once | INTRAMUSCULAR | Status: AC
  Administered 2018-09-09: 125 mg via INTRAVENOUS
  Filled 2018-09-09: qty 2

## 2018-09-09 MED ORDER — SODIUM CHLORIDE 0.9 % IV BOLUS
500.0000 mL | Freq: Once | INTRAVENOUS | Status: AC
  Administered 2018-09-09: 500 mL via INTRAVENOUS

## 2018-09-09 MED ORDER — IOPAMIDOL (ISOVUE-370) INJECTION 76%
100.0000 mL | Freq: Once | INTRAVENOUS | Status: AC | PRN
  Administered 2018-09-09: 100 mL via INTRAVENOUS

## 2018-09-09 NOTE — Progress Notes (Signed)
Subjective: [name] Hill, an  17 year old male with a history of sickle cell anemia, history of acute chest syndrome, and history of chronic pain syndrome was admitted and sickle cell crisis.  Patient was transitioned to telemetry on 09/08/2018.  Previous chest xray was significant for new focal infiltrate in the right infrahilar lung base.  Patient treated aggressively for acute chest syndrome. He received 1 unit of packed red cells overnight. Patient had a febrile transfusion reaction.  Patient febrile and requires 4 L oxygen to maintain oxygen saturation.  Patient complaining of pain primarily to rib cage (right anterior chest). Occurs primarily with deep breathing. Pain intensity 5-6/10.  He endorses chest pain, left jaw pain, and dyspnea.  He denies persistent cough, hemoptysis, abdominal pain, dysuria, nausea, vomiting, or diarrhea.   Objective:  Vital signs in last 24 hours:  Vitals:   09/09/18 0442 09/09/18 0617 09/09/18 0750 09/09/18 0750  BP:  134/79  (!) 159/86  Pulse:  (!) 125  (!) 110  Resp: (!) 37 (!) 32 (!) 36 (!) 28  Temp:  (!) 102.7 F (39.3 C)  (!) 101 F (38.3 C)  TempSrc:  Oral  Oral  SpO2: 90% (!) 89% 96% 98%  Weight:      Height:        Intake/Output from previous day:   Intake/Output Summary (Last 24 hours) at 09/09/2018 0842 Last data filed at 09/09/2018 0753 Gross per 24 hour  Intake 3169.18 ml  Output 1900 ml  Net 1269.18 ml   Physical Exam  Constitutional: He is oriented to person, place, and time. He appears distressed (moderate).  HENT:  Head: Normocephalic.  Eyes: Pupils are equal, round, and reactive to light. No scleral icterus.  Neck: Normal range of motion.  Cardiovascular: Regular rhythm and intact distal pulses. Tachycardia present. Exam reveals no gallop and no friction rub.  No murmur heard. Heart rate 110-130  Pulmonary/Chest: Tachypnea noted. He has no decreased breath sounds.  Increased respiratory effort  Abdominal: Soft.  Normal appearance. There is no tenderness.  Musculoskeletal: Normal range of motion.  Neurological: He is alert and oriented to person, place, and time.  Skin: Skin is warm. He is diaphoretic.  Psychiatric: He has a normal mood and affect. His behavior is normal. Thought content normal.   .  Lab Results:  Basic Metabolic Panel:    Component Value Date/Time   NA 140 09/07/2018 0530   NA 142 06/26/2018 1416   K 4.1 09/07/2018 0530   CL 104 09/07/2018 0530   CO2 26 09/07/2018 0530   BUN 10 09/07/2018 0530   BUN 7 06/26/2018 1416   CREATININE 0.64 09/07/2018 0530   GLUCOSE 131 (H) 09/07/2018 0530   CALCIUM 9.1 09/07/2018 0530   CBC:    Component Value Date/Time   WBC 20.5 (H) 09/08/2018 0404   HGB 7.1 (L) 09/08/2018 0404   HGB 9.6 (L) 06/26/2018 1416   HCT 20.6 (L) 09/08/2018 0404   HCT 28.5 (L) 06/26/2018 1416   PLT 633 (H) 09/08/2018 0404   PLT 405 06/26/2018 1416   MCV 88.0 09/08/2018 0404   MCV 92 06/26/2018 1416   NEUTROABS 15.0 (H) 09/07/2018 0530   NEUTROABS 3.7 06/26/2018 1416   LYMPHSABS 4.5 (H) 09/07/2018 0530   LYMPHSABS 3.3 (H) 06/26/2018 1416   MONOABS 2.2 (H) 09/07/2018 0530   EOSABS 0.0 09/07/2018 0530   EOSABS 0.2 06/26/2018 1416   BASOSABS 0.1 09/07/2018 0530   BASOSABS 0.1 06/26/2018 1416    Recent Results (  from the past 240 hour(s))  Culture, blood (routine x 2)     Status: None (Preliminary result)   Collection Time: 09/06/18  8:49 AM  Result Value Ref Range Status   Specimen Description   Final    BLOOD RIGHT ARM Performed at [facility], [address] [place], [place] [address]    Special Requests   Final    BOTTLES DRAWN AEROBIC AND ANAEROBIC Blood Culture results may not be optimal due to an excessive volume of blood received in culture bottles Performed at [facility], [address], [place], [place] [address]    Culture   Final    NO GROWTH 2 DAYS Performed at [facility], [address], [place], [place] [address]    Report Status PENDING  Incomplete  Culture, blood (routine x 2)     Status: None (Preliminary result)   Collection Time: 09/06/18  8:49 AM  Result Value Ref Range Status   Specimen Description   Final    BLOOD RIGHT HAND Performed at [facility], [address], [place], [place] [address]    Special Requests   Final    BOTTLES DRAWN AEROBIC AND ANAEROBIC Blood Culture results may not be optimal due to an excessive volume of blood received in culture bottles Performed at [facility], [address] [place], [place] [address]    Culture   Final    NO GROWTH 2 DAYS Performed at [facility], [address], [place], [place] [address]    Report Status PENDING  Incomplete    Studies/Results: Dg Chest 2 View  Result Date: 09/08/2018 CLINICAL DATA:  Sickle cell crisis EXAM: CHEST - 2 VIEW COMPARISON:  09/06/2018 FINDINGS: Mild cardiomegaly. Linear atelectasis or scarring at the left lung base. Mild chronic elevation of the right hemidiaphragm is stable. New focal opacity in the right infrahilar region. No effusions. No acute bony abnormality. IMPRESSION: Mild cardiomegaly. New focal infiltrate in the right infrahilar lung base. Stable mild chronic elevation of the right hemidiaphragm. Left base atelectasis or scarring. Electronically Signed   By: Charlett Nose M.D.   On: 09/08/2018 09:53    Medications: Scheduled Meds: . enoxaparin (LOVENOX) injection  40 mg Subcutaneous Q24H  . HYDROmorphone   Intravenous Q4H  . hydroxyurea  1,500 mg Oral Daily  . ketorolac  30 mg Intravenous Q6H  . lisinopril  10 mg Oral Daily  . senna-docusate  1 tablet Oral BID   Continuous Infusions: . cefTRIAXone (ROCEPHIN)  IV 1 g (09/08/18 1533)  . dextrose 5 % and 0.45% NaCl 125 mL/hr at 09/09/18 0600  . vancomycin Stopped (09/09/18 0542)   PRN Meds:.acetaminophen, diphenhydrAMINE **OR** diphenhydrAMINE, naloxone **AND** sodium  chloride flush, ondansetron (ZOFRAN) IV, oxyCODONE, polyethylene glycol   Assessment/Plan: Principal Problem:   Hb-SS disease with vaso-occlusive crisis (HCC) Active Problems:   Asthma   Sickle cell crisis (HCC)   Leukocytosis   Sickle cell anemia with crisis (HCC)   Fever  Acute chest syndrome vs. CAP Chest xray shows new infiltrate in the right infrahilar lung base.  Initiate empiric antibiotics Rocephin 1 g every 24 hours  Vancomycin per pharmacy consult Maintain oxygen saturation above 90%.  Patient not requiring 4L oxygen to maintain oxygen saturation above 90% CT angio to r/o PE Pending procalcitonin LDH pending  Sickle cell anemia with pain crisis: Continue IV Dilaudid PCA per weight-based protocol Continue Toradol 30 mg IV every 6  hours as needed Continue hypotonic IV fluids Wean PCA as pain symptoms resolve Oxycodone 10 mg every 4 hours as needed for moderate to severe breakthrough pain Continue to monitor pain closely in the context of function Maintain oxygen saturation above 90%  Leukocytosis: WBC count 20,500, which is increased from baseline.  Maximum temperature 102  Continue to monitor closely. Stat CBC pending  Tachycardia:  Between [phone] Continue to monitor closely  Sickle cell anemia: Continue folic acid and hydroxyurea Hemoglobin 7.1, which was decreased from baseline.  Pt transfused 1 unit of PRBCs and suspect to have transfusion reaction.  Type and screen, transfuse 1 unit.  Continue to monitor closely. CBC in a.m.  History of asthma: Continue MDIs as needed  Thrombocytosis: Platelet ct 633, reactive. Continue to monitor closely CBC in am  Code Status: Full Code Fami ly Communication: N/A Disposition Plan: Not yet ready for discharge  Nolon Nations  APRN, MSN, FNP-C Patient Care Center [facility] [address] [place], [place] [address] [phone]  If 7PM-7AM, please contact  night-coverage.  09/09/2018, 8:42 AM  LOS: 3 days

## 2018-09-09 NOTE — Progress Notes (Signed)
   09/09/18 0228  Vitals  Temp (!) 101.8 F (38.8 C)  Temp Source Oral  BP (!) 165/90  MAP (mmHg) 111  BP Location Right Arm  BP Method Automatic  Patient Position (if appropriate) Lying  Pulse Rate (!) 121  Pulse Rate Source Monitor  Resp (!) 40   Transfusion stopped at 0225 due to elevated BP, RR, Temp, Pulse. On Call Physician notified.   Reeves Forth, RN 09/09/18 02:35AM

## 2018-09-09 NOTE — Progress Notes (Signed)
[name] Hill, a 17 year old male with a history of sickle cell anemia, HbSS, history of acute chest syndrome, anemia of chronic disease, and leukocytosis admitted in vaso-occlusive sickle cell crisis.  Acute chest syndrome:  Chest xray on 09/08/18 was significant for new infiltrate. Empiric antibiotics initiated. No evidence of pulmonary embolism on CTA.  Therapeutic exchange transfusion. Remove 300 cc's and transfuse 1 unit of packed red blood cells. Repeat CBC in am. Hemoglobinopathy pending. Blood cultures negative.   Maintain oxygen saturation above 90%. Patient currently on 4L via/[place].   Patient transitioned to stepdown.    Nolon Nations  APRN, MSN, FNP-C Patient Care [facility] [address] [place], [place] [address] [phone]

## 2018-09-09 NOTE — Progress Notes (Signed)
Upon assessment. MEWS score is a 4. Temp 101, BP 159/86, HR 110-120s (up to 150 when standing to urinate), O2 Sats 90-91% 3L/[place], increased to 4L/[place] at 97%. Pt states he feels SOB- able to use IS up to 2000. Pt tachypneic, breathing very shallow. Encouraged to take deep breaths through his nose, out mouth. MD [facility] notified of concerns and high MEWS score. Stated she will come see patient when able, but to call RR if pt worsens. Will continue to monitor closely.

## 2018-09-09 NOTE — Plan of Care (Signed)
  Problem: Education: Goal: Knowledge of vaso-occlusive preventative measures will improve Outcome: Progressing Goal: Awareness of infection prevention will improve Outcome: Progressing Goal: Awareness of signs and symptoms of anemia will improve Outcome: Progressing   Problem: Self-Care: Goal: Ability to incorporate actions that prevent/reduce pain crisis will improve Outcome: Progressing   Problem: Bowel/Gastric: Goal: Gut motility will be maintained Outcome: Progressing   Problem: Sensory: Goal: Pain level will decrease with appropriate interventions Outcome: Progressing   Reeves Forth, RN 09/09/18 4:17 AM

## 2018-09-09 NOTE — Progress Notes (Signed)
Physician notified of temp 102.22F   RN gaveTylenol 650 mg given and incentive spirometer use encouraged.  Reeves Forth, RN 09/09/18 06:24AM

## 2018-09-10 LAB — CBC WITH DIFFERENTIAL/PLATELET
Abs Immature Granulocytes: 0.14 10*3/uL — ABNORMAL HIGH (ref 0.00–0.07)
Basophils Absolute: 0 10*3/uL (ref 0.0–0.1)
Basophils Relative: 0 %
Eosinophils Absolute: 0 10*3/uL (ref 0.0–0.5)
Eosinophils Relative: 0 %
HCT: 22.3 % — ABNORMAL LOW (ref 39.0–52.0)
Hemoglobin: 7.6 g/dL — ABNORMAL LOW (ref 13.0–17.0)
Immature Granulocytes: 1 %
Lymphocytes Relative: 10 %
Lymphs Abs: 1.8 10*3/uL (ref 0.7–4.0)
MCH: 30 pg (ref 26.0–34.0)
MCHC: 34.1 g/dL (ref 30.0–36.0)
MCV: 88.1 fL (ref 80.0–100.0)
Monocytes Absolute: 1.4 10*3/uL — ABNORMAL HIGH (ref 0.1–1.0)
Monocytes Relative: 8 %
Neutro Abs: 14.3 10*3/uL — ABNORMAL HIGH (ref 1.7–7.7)
Neutrophils Relative %: 81 %
Platelets: 669 10*3/uL — ABNORMAL HIGH (ref 150–400)
RBC: 2.53 MIL/uL — ABNORMAL LOW (ref 4.22–5.81)
RDW: 17.2 % — ABNORMAL HIGH (ref 11.5–15.5)
WBC: 17.7 10*3/uL — ABNORMAL HIGH (ref 4.0–10.5)
nRBC: 1.1 % — ABNORMAL HIGH (ref 0.0–0.2)

## 2018-09-10 LAB — BASIC METABOLIC PANEL
Anion gap: 9 (ref 5–15)
BUN: 14 mg/dL (ref 6–20)
CO2: 25 mmol/L (ref 22–32)
Calcium: 8.7 mg/dL — ABNORMAL LOW (ref 8.9–10.3)
Chloride: 106 mmol/L (ref 98–111)
Creatinine, Ser: 0.56 mg/dL — ABNORMAL LOW (ref 0.61–1.24)
GFR calc Af Amer: >60 mL/min (ref >60)
GFR calc non Af Amer: >60 mL/min (ref >60)
Glucose, Bld: 129 mg/dL — ABNORMAL HIGH (ref 70–99)
Potassium: 4 mmol/L (ref 3.5–5.1)
Sodium: 140 mmol/L (ref 135–145)

## 2018-09-10 LAB — MRSA PCR SCREENING: MRSA by PCR: NEGATIVE

## 2018-09-10 MED ORDER — HYDROMORPHONE 1 MG/ML IV SOLN
INTRAVENOUS | Status: DC
  Administered 2018-09-10 (×2): 0.5 mg via INTRAVENOUS
  Administered 2018-09-10: 1 mg via INTRAVENOUS
  Administered 2018-09-11: 0.5 mg via INTRAVENOUS
  Administered 2018-09-11: 0 mg via INTRAVENOUS
  Administered 2018-09-11: 1 mg via INTRAVENOUS

## 2018-09-10 NOTE — Progress Notes (Signed)
Subjective: [name] Hill, an  17 year old male with a history of sickle cell anemia, history of acute chest syndrome, and history of chronic pain syndrome was admitted and sickle cell crisis.    Patient states that pain intensity has improved overnight.  Current pain intensity is 2/10 primarily to rib cage.  Patient has remained afebrile overnight.  Maintaining oxygen saturation at 94% on 2 L oxygen. He denies persistent cough, dyspnea, hemoptysis, abdominal pain, dysuria, nausea, vomiting, or diarrhea.    Objective:  Vital signs in last 24 hours:  Vitals:   09/10/18 0545 09/10/18 0601 09/10/18 0800 09/10/18 0900  BP: (!) 106/55 (!) 101/55  (!) 144/74  Pulse: 93 91  76  Resp: 14  17 17   Temp: 98.4 F (36.9 C) 98.5 F (36.9 C) 98.5 F (36.9 C) 98.7 F (37.1 C)  TempSrc: Oral Oral Axillary Axillary  SpO2: 94% 94% 97% 98%  Weight:      Height:        Intake/Output from previous day:   Intake/Output Summary (Last 24 hours) at 09/10/2018 0958 Last data filed at 09/10/2018 0900 Gross per 24 hour  Intake 1535.73 ml  Output 1300 ml  Net 235.73 ml   Physical Exam  Constitutional: He is oriented to person, place, and time.  HENT:  Head: Normocephalic.  Eyes: Pupils are equal, round, and reactive to light. No scleral icterus.  Neck: Normal range of motion.  Cardiovascular: Regular rhythm and intact distal pulses. Exam reveals no gallop and no friction rub.  No murmur heard. Pulmonary/Chest: He has no decreased breath sounds.  Abdominal: Soft. Normal appearance. There is no tenderness.  Musculoskeletal: Normal range of motion.  Neurological: He is alert and oriented to person, place, and time.  Skin: Skin is warm.  Psychiatric: He has a normal mood and affect. His behavior is normal. Thought content normal.   .  Lab Results:  Basic Metabolic Panel:    Component Value Date/Time   NA 136 09/09/2018 0847   NA 142 06/26/2018 1416   K 3.4 (L) 09/09/2018 0847   CL 102  09/09/2018 0847   CO2 25 09/09/2018 0847   BUN 6 09/09/2018 0847   BUN 7 06/26/2018 1416   CREATININE 0.57 (L) 09/09/2018 0847   GLUCOSE 132 (H) 09/09/2018 0847   CALCIUM 8.0 (L) 09/09/2018 0847   CBC:    Component Value Date/Time   WBC 18.6 (H) 09/09/2018 0847   HGB 7.2 (L) 09/09/2018 0847   HGB 9.6 (L) 06/26/2018 1416   HCT 20.8 (L) 09/09/2018 0847   HCT 28.5 (L) 06/26/2018 1416   PLT 669 (H) 09/09/2018 0847   PLT 405 06/26/2018 1416   MCV 88.1 09/09/2018 0847   MCV 92 06/26/2018 1416   NEUTROABS 14.1 (H) 09/09/2018 0847   NEUTROABS 3.7 06/26/2018 1416   LYMPHSABS 2.8 09/09/2018 0847   LYMPHSABS 3.3 (H) 06/26/2018 1416   MONOABS 1.5 (H) 09/09/2018 0847   EOSABS 0.0 09/09/2018 0847   EOSABS 0.2 06/26/2018 1416   BASOSABS 0.1 09/09/2018 0847   BASOSABS 0.1 06/26/2018 1416    Recent Results (from the past 240 hour(s))  Culture, blood (routine x 2)     Status: None (Preliminary result)   Collection Time: 09/06/18  8:49 AM  Result Value Ref Range Status   Specimen Description   Final    BLOOD RIGHT ARM Performed at [facility], [address], [place], [place] [address]    Special Requests   Final  BOTTLES DRAWN AEROBIC AND ANAEROBIC Blood Culture results may not be optimal due to an excessive volume of blood received in culture bottles Performed at [facility], [address], [place], [place] [address]    Culture   Final    NO GROWTH 4 DAYS Performed at [facility], [address], [place], [place] [address]    Report Status PENDING  Incomplete  Culture, blood (routine x 2)     Status: None (Preliminary result)   Collection Time: 09/06/18  8:49 AM  Result Value Ref Range Status   Specimen Description   Final    BLOOD RIGHT HAND Performed at [facility], [address], [place], [place] [address]    Special Requests   Final    BOTTLES DRAWN AEROBIC AND ANAEROBIC Blood Culture results may not be  optimal due to an excessive volume of blood received in culture bottles Performed at [facility], [address], [place], [place] [address]    Culture   Final    NO GROWTH 4 DAYS Performed at [facility], [address], [place], [place] [address]    Report Status PENDING  Incomplete    Studies/Results: Ct Angio Chest Pe W Or Wo Contrast  Result Date: 09/09/2018 CLINICAL DATA:  17 year old male with history of sickle cell anemia presenting with fever and right anterior chest pain which occurs primarily with deep breathing. Dyspnea. EXAM: CT ANGIOGRAPHY CHEST WITH CONTRAST TECHNIQUE: Multidetector CT imaging of the chest was performed using the standard protocol during bolus administration of intravenous contrast. Multiplanar CT image reconstructions and MIPs were obtained to evaluate the vascular anatomy. CONTRAST:  ISOVUE-370 IOPAMIDOL (ISOVUE-370) INJECTION 76%, 50mL ISOVUE-370 IOPAMIDOL (ISOVUE-370) INJECTION 76% COMPARISON:  Chest CT 08/26/2018. FINDINGS: Comment: Today's study is limited by considerable patient respiratory motion. Cardiovascular: There are no central, lobar or proximal segmental sized filling defects in the pulmonary arterial tree to suggest pulmonary embolism. Unfortunately, respiratory motion prohibits accurate assessment of distal segmental and subsegmental sized vessels in the lungs bilaterally. Heart size is enlarged with left ventricular dilatation. No atherosclerotic calcifications in the thoracic aorta or the coronary arteries. Mediastinum/Nodes: No pathologically enlarged mediastinal or hilar lymph nodes. Esophagus is unremarkable in appearance. No axillary lymphadenopathy. Lungs/Pleura: Trace bilateral pleural effusions lying dependently. There are areas of volume loss in the lower lobes of the lungs bilaterally, new compared to the recent prior study, compatible with areas of atelectasis. There also appears to be a small amount of  airspace consolidation in the right lower lobe best appreciated on axial image 43 of series 10. No definite suspicious appearing pulmonary nodules or masses are noted. Upper Abdomen: Unremarkable. Musculoskeletal: Patchy areas of sclerosis throughout the visualized axial skeleton, as well as H-shaped vertebral bodies, indicative of chronic osseous changes from sickle cell disease. There are no aggressive appearing lytic or blastic lesions noted in the visualized portions of the skeleton. Review of the MIP images confirms the above findings. IMPRESSION: 1. Today's study is limited by significant respiratory motion. With these limitations in mind there is no evidence of central, lobar or proximal segmental sized pulmonary embolism. 2. Right lower lobe airspace consolidation concerning for infection. There also extensive areas of dependent atelectasis in the lower lobes of the lungs bilaterally. 3. Trace bilateral pleural effusions lying dependently. 4. Cardiomegaly with left ventricular dilatation. Electronically Signed   By: Trudie Reed M.D.   On: 09/09/2018 12:20    Medications: Scheduled Meds: .  diphenhydrAMINE  25 mg Intravenous Once  . enoxaparin (LOVENOX) injection  40 mg Subcutaneous Q24H  . HYDROmorphone   Intravenous Q4H  . hydroxyurea  1,500 mg Oral Daily  . ketorolac  30 mg Intravenous Q6H  . lisinopril  10 mg Oral Daily  . senna-docusate  1 tablet Oral BID   Continuous Infusions: . cefTRIAXone (ROCEPHIN)  IV Stopped (09/09/18 1238)  . dextrose 5 % and 0.45% NaCl 125 mL/hr at 09/10/18 0610  . vancomycin Stopped (09/10/18 0514)   PRN Meds:.acetaminophen, diphenhydrAMINE **OR** diphenhydrAMINE, naloxone **AND** sodium chloride flush, ondansetron (ZOFRAN) IV, oxyCODONE, polyethylene glycol   Assessment/Plan: Principal Problem:   Hb-SS disease with vaso-occlusive crisis (HCC) Active Problems:   Asthma   Sickle cell crisis (HCC)   Leukocytosis   Acute chest syndrome due to sickle  cell crisis (HCC)   Sickle cell anemia with crisis (HCC)   Fever  Acute chest syndrome vs. CAP Rocephin 1 g every 24 hours  Vancomycin per pharmacy consult Maintain oxygen saturation above 90%.  Therapeutic phlebotomy overnight, removed 300 cc and transfused 1 unit of PRBCs CBC pending Transfer to telemetry  Sickle cell anemia with pain crisis: Continue IV Dilaudid PCA per weight-based protocol Continue Toradol 30 mg IV every 6 hours as needed Continue hypotonic IV fluids Wean PCA as pain symptoms resolve Oxycodone 10 mg every 4 hours as needed for moderate to severe breakthrough pain Continue to monitor pain closely in the context of function Maintain oxygen saturation above 90%  Leukocytosis: WBC count 20,500, which is increased from baseline.  Maximum temperature 102  Continue to monitor closely.  CBC pending  Tachycardia:  Between 110-130 Continue to monitor closely  Sickle cell anemia: Continue folic acid and hydroxyurea Continue to monitor closely.   History of asthma: Continue MDIs as needed  Thrombocytosis: Platelet ct 633, reactive. Continue to monitor closely CBC pending  Code Status: Full Code Fami ly Communication: N/A Disposition Plan: Not yet ready for discharge  Nolon Nations  APRN, MSN, FNP-C [facility] [address] [place], [place] [address] [phone]  If 7PM-7AM, please contact night-coverage.  09/10/2018, 9:58 AM  LOS: 4 days

## 2018-09-11 ENCOUNTER — Inpatient Hospital Stay (HOSPITAL_COMMUNITY): Payer: Medicaid Other

## 2018-09-11 ENCOUNTER — Encounter: Payer: Self-pay | Admitting: Family Medicine

## 2018-09-11 DIAGNOSIS — F1190 Opioid use, unspecified, uncomplicated: Secondary | ICD-10-CM

## 2018-09-11 DIAGNOSIS — G894 Chronic pain syndrome: Secondary | ICD-10-CM

## 2018-09-11 DIAGNOSIS — D571 Sickle-cell disease without crisis: Secondary | ICD-10-CM

## 2018-09-11 LAB — CBC WITH DIFFERENTIAL/PLATELET
Abs Immature Granulocytes: 0.12 10*3/uL — ABNORMAL HIGH (ref 0.00–0.07)
Basophils Absolute: 0 10*3/uL (ref 0.0–0.1)
Basophils Relative: 0 %
Eosinophils Absolute: 0.1 10*3/uL (ref 0.0–0.5)
Eosinophils Relative: 1 %
HCT: 21.4 % — ABNORMAL LOW (ref 39.0–52.0)
Hemoglobin: 7.1 g/dL — ABNORMAL LOW (ref 13.0–17.0)
Immature Granulocytes: 1 %
Lymphocytes Relative: 46 %
Lymphs Abs: 9.4 10*3/uL — ABNORMAL HIGH (ref 0.7–4.0)
MCH: 30 pg (ref 26.0–34.0)
MCHC: 33.2 g/dL (ref 30.0–36.0)
MCV: 90.3 fL (ref 80.0–100.0)
Monocytes Absolute: 1.1 10*3/uL — ABNORMAL HIGH (ref 0.1–1.0)
Monocytes Relative: 5 %
Neutro Abs: 9.5 10*3/uL — ABNORMAL HIGH (ref 1.7–7.7)
Neutrophils Relative %: 47 %
Platelets: 645 10*3/uL — ABNORMAL HIGH (ref 150–400)
RBC: 2.37 MIL/uL — ABNORMAL LOW (ref 4.22–5.81)
RDW: 17.8 % — ABNORMAL HIGH (ref 11.5–15.5)
WBC: 20.1 10*3/uL — ABNORMAL HIGH (ref 4.0–10.5)
nRBC: 1.2 % — ABNORMAL HIGH (ref 0.0–0.2)

## 2018-09-11 LAB — BPAM RBC
Blood Product Expiration Date: 201911052359
Blood Product Expiration Date: 201911112359
ISSUE DATE / TIME: 201910160046
ISSUE DATE / TIME: 201910170536
Unit Type and Rh: 5100
Unit Type and Rh: 5100

## 2018-09-11 LAB — TYPE AND SCREEN
ABO/RH(D): O POS
Antibody Screen: NEGATIVE
Unit division: 0
Unit division: 0

## 2018-09-11 LAB — CULTURE, BLOOD (ROUTINE X 2)
Culture: NO GROWTH
Culture: NO GROWTH

## 2018-09-11 LAB — HEMOGLOBINOPATHY EVALUATION
Hgb A2 Quant: 3.8 % — ABNORMAL HIGH (ref 1.8–3.2)
Hgb A: 6.8 % — ABNORMAL LOW (ref 96.4–98.8)
Hgb C: 0 %
Hgb F Quant: 7.1 % — ABNORMAL HIGH (ref 0.0–2.0)
Hgb S Quant: 82.3 % — ABNORMAL HIGH
Hgb Variant: 0 %

## 2018-09-11 MED ORDER — ALBUTEROL SULFATE HFA 108 (90 BASE) MCG/ACT IN AERS: 2.0000 | INHALATION_SPRAY | RESPIRATORY_TRACT | 3 refills | Status: DC | PRN

## 2018-09-11 MED ORDER — OXYCODONE HCL 10 MG PO TABS: 10.0000 mg | ORAL_TABLET | ORAL | 0 refills | Status: DC | PRN

## 2018-09-11 NOTE — Progress Notes (Signed)
D/C instructions reviewed w/ pt. Pt verbalizes understanding and all questions answered. Pt ambulated off unit escorted by this Clinical research associate to [employer] car. Pt in stable condition, in possession of d/c packet and all personal belongings.

## 2018-09-11 NOTE — Plan of Care (Signed)
  Problem: Education: Goal: Knowledge of vaso-occlusive preventative measures will improve Outcome: Progressing Goal: Awareness of infection prevention will improve Outcome: Progressing Note:  On IV abx for pna.  Goal: Awareness of signs and symptoms of anemia will improve Outcome: Progressing Goal: Long-term complications will improve Outcome: Progressing   Problem: Self-Care: Goal: Ability to incorporate actions that prevent/reduce pain crisis will improve Outcome: Progressing   Problem: Bowel/Gastric: Goal: Gut motility will be maintained Outcome: Progressing   Problem: Tissue Perfusion: Goal: Complications related to inadequate tissue perfusion will be avoided or minimized Outcome: Progressing   Problem: Respiratory: Goal: Pulmonary complications will be avoided or minimized Outcome: Progressing Goal: Acute Chest Syndrome will be identified early to prevent complications Outcome: Progressing   Problem: Fluid Volume: Goal: Ability to maintain a balanced intake and output will improve Outcome: Progressing   Problem: Sensory: Goal: Pain level will decrease with appropriate interventions Outcome: Progressing Note:  Using dilaudid PCA minimally.    Problem: Health Behavior: Goal: Postive changes in compliance with treatment and prescription regimens will improve Outcome: Progressing   Problem: Education: Goal: Knowledge of General Education information will improve Description Including pain rating scale, medication(s)/side effects and non-pharmacologic comfort measures Outcome: Progressing   Problem: Health Behavior/Discharge Planning: Goal: Ability to manage health-related needs will improve Outcome: Progressing   Problem: Clinical Measurements: Goal: Ability to maintain clinical measurements within normal limits will improve Outcome: Progressing Goal: Will remain free from infection Outcome: Progressing Goal: Diagnostic test results will improve Outcome:  Progressing Goal: Respiratory complications will improve Outcome: Progressing Goal: Cardiovascular complication will be avoided Outcome: Progressing   Problem: Activity: Goal: Risk for activity intolerance will decrease Outcome: Progressing   Problem: Nutrition: Goal: Adequate nutrition will be maintained Outcome: Progressing   Problem: Coping: Goal: Level of anxiety will decrease Outcome: Progressing   Problem: Elimination: Goal: Will not experience complications related to bowel motility Outcome: Progressing Goal: Will not experience complications related to urinary retention Outcome: Progressing   Problem: Pain Managment: Goal: General experience of comfort will improve Outcome: Progressing   Problem: Safety: Goal: Ability to remain free from injury will improve Outcome: Progressing   Problem: Skin Integrity: Goal: Risk for impaired skin integrity will decrease Outcome: Progressing

## 2018-09-11 NOTE — Discharge Summary (Signed)
Physician Discharge Summary  [name] Hill ZOX:[id number] DOB: 26-Dec-2000 DOA: 09/06/2018  PCP: Kallie Locks, FNP  Admit date: 09/06/2018  Discharge date: 09/11/2018  Discharge Diagnoses:  Principal Problem:   Hb-SS disease with vaso-occlusive crisis (HCC) Active Problems:   Asthma   Sickle cell crisis (HCC)   Leukocytosis   Acute chest syndrome due to sickle cell crisis (HCC)   Sickle cell anemia with crisis (HCC)   Fever   Discharge Condition: Stable  Disposition:  Pt is discharged home in good condition and is to follow up with Kallie Locks, FNP in 1 week to have labs evaluated. [name] Hill is instructed to increase activity slowly and balance with rest for the next few days, and use prescribed medication to complete treatment of pain  Diet: Regular Wt Readings from Last 3 Encounters:  09/07/18 84.9 kg (88 %, Z= 1.16)*  08/27/18 83.6 kg (86 %, Z= 1.08)*  08/26/18 79.4 kg (79 %, Z= 0.81)*   * Growth percentiles are based on CDC (Boys, 2-20 Years) data.    History of present illness:  [name] Hill, an 17 year old male with a medical history significant for sickle cell anemia, chronic pain syndrome, and history of acute chest syndrome presented to emergency department with ongoing pain.  Pain intensity elevated over 3 days.  Patient taking and pain medications consistently without relief.  Pain intensity 10/10 on arrival primarily to lower extremities.  Patient was recently admitted with acute chest syndrome and was treated and discharged home patient is still continuing oral antibiotics for acute chest syndrome.  Symptoms are not completely resolved and patient now states that pain is worsening.  Patient denies dyspnea, chest pain, dysuria, nausea, vomiting, or diarrhea.  ER course.  No evidence of acute chest syndrome on admission.  Patient received 6 mg of IV Dilaudid, IV Toradol, and IV fluids, pain is persisted despite treatment.  Patient admitted for pain  management related to sickle cell crisis  Hospital Course:  Acute chest syndrome: Patient was admitted and sickle cell pain crisis.  On day 3 of admission, patient's vital signs changed dramatically.  Respirations range between 18, 33.  Patient required 4 L of oxygen to maintain saturation above 90%.  Chest x-ray showed new infiltrate in the right infrahilar lung base.  Procalcitonin was negative.  Patient was transferred to stepdown for acute chest syndrome.  IV Rocephin and IV vancomycin were added empirically.  MRSA per PCR negative, vancomycin discontinued.  IV Rocephin continue for a total of 4 days.  Patient also underwent therapeutic phlebotomy.  300 cc were removed and patient received 1 unit of packed red blood cells.  Current hemoglobin is currently 7.1.  WBC count continues to be elevated at 20.1, suspected to be related to receiving IV steroids prior to blood transfusion.  Also, patient has chronic leukocytosis.  Patient has been afebrile for greater than 24 hours.  Maintaining oxygen saturation at 99% on room air. Chest x-ray repeated this a.m., showed improving infiltrate. Recommend repeating chest xray in 4-6 weeks.   Vital signs of normalized.  Patient will follow-up in primary care in 1 week to repeat CBC and CMP.  Sickle cell anemia with crisis:  Patient was admitted for sickle cell pain crisis and managed appropriately with IVF, IV Dilaudid via PCA and IV Toradol, as well as other adjunct therapies per sickle cell pain management protocols.  Patient discharged home on oxycodone 10 mg every 4 hours as needed for moderate to severe pain, #42.  Patient to follow-up in primary care for further pain management and opiate prescriptions. Reviewed [facility] Reporting system prior to prescribing opiate medications. No inconsistencies noted.   Tachycardia: Patient monitored on telemetry unit.  Heart rate normalized.  Heart rate ranges between 80-90.  History of asthma: Continue home  MDIs.  Sent albuterol to pharmacy   Patient was discharged home today in a hemodynamically stable condition.   Discharge Exam: Vitals:   09/11/18 1003 09/11/18 1144  BP: (!) 143/92   Pulse: 73   Resp: 16 10  Temp:    SpO2: 100% 98%   Vitals:   09/11/18 0430 09/11/18 0737 09/11/18 1003 09/11/18 1144  BP: 111/63  (!) 143/92   Pulse: 84  73   Resp: (!) 30 19 16 10   Temp: 97.9 F (36.6 C)     TempSrc: Oral     SpO2: 98% 98% 100% 98%  Weight:      Height:        General appearance : Awake, alert, not in any distress. Speech Clear. Not toxic looking HEENT: Atraumatic and Normocephalic, pupils equally reactive to light and accomodation Neck: Supple, no JVD. No cervical lymphadenopathy.  Chest: Good air entry bilaterally, no added sounds  CVS: S1 S2 regular, no murmurs.  Abdomen: Bowel sounds present, Non tender and not distended with no gaurding, rigidity or rebound. Extremities: B/L Lower Ext shows no edema, both legs are warm to touch Neurology: Awake alert, and oriented X 3, CN II-XII intact, Non focal Skin: No Rash  Discharge Instructions  Discharge Instructions    Discharge patient   Complete by:  As directed    Discharge disposition:  01-Home or Self Care   Discharge patient date:  09/11/2018     Allergies as of 09/11/2018      Reactions   Other Cough   Seasonal allergies.      Medication List    STOP taking these medications   amoxicillin-clavulanate 875-125 MG tablet Commonly known as:  AUGMENTIN     TAKE these medications   acetaminophen 650 MG CR tablet Commonly known as:  TYLENOL Take 1,300 mg by mouth every 8 (eight) hours as needed for pain.   albuterol 108 (90 Base) MCG/ACT inhaler Commonly known as:  PROVENTIL HFA;VENTOLIN HFA Inhale 2 puffs into the lungs every 4 (four) hours as needed for wheezing or shortness of breath.   hydroxyurea 400 MG capsule Commonly known as:  DROXIA Take 4 capsules (1,600 mg total) by mouth daily.    lisinopril 10 MG tablet Commonly known as:  PRINIVIL,ZESTRIL Take 1 tablet (10 mg total) by mouth daily.   Oxycodone HCl 10 MG Tabs Take 1 tablet (10 mg total) by mouth every 4 (four) hours as needed for up to 7 days for severe pain. What changed:    medication strength  how much to take  when to take this       The results of significant diagnostics from this hospitalization (including imaging, microbiology, ancillary and laboratory) are listed below for reference.    Significant Diagnostic Studies: Dg Chest 2 View  Result Date: 09/11/2018 CLINICAL DATA:  Acute chest syndrome.  Hemoglobin S disease. EXAM: CHEST - 2 VIEW COMPARISON:  09/08/2018 FINDINGS: Right lower lobe airspace disease shows interval improvement. No new area of infiltrate or effusion. Cardiac enlargement with normal vascularity. IMPRESSION: Interval improvement in right lower lobe infiltrate/atelectasis. Electronically Signed   By: Marlan Palau M.D.   On: 09/11/2018 10:06   Dg Chest  2 View  Result Date: 09/08/2018 CLINICAL DATA:  Sickle cell crisis EXAM: CHEST - 2 VIEW COMPARISON:  09/06/2018 FINDINGS: Mild cardiomegaly. Linear atelectasis or scarring at the left lung base. Mild chronic elevation of the right hemidiaphragm is stable. New focal opacity in the right infrahilar region. No effusions. No acute bony abnormality. IMPRESSION: Mild cardiomegaly. New focal infiltrate in the right infrahilar lung base. Stable mild chronic elevation of the right hemidiaphragm. Left base atelectasis or scarring. Electronically Signed   By: Charlett Nose M.D.   On: 09/08/2018 09:53   Dg Chest 2 View  Result Date: 09/06/2018 CLINICAL DATA:  Diffuse pain, sickle cell crisis EXAM: CHEST - 2 VIEW COMPARISON:  08/27/2018 FINDINGS: Cardiac shadow is mildly prominent but stable. The lungs are well aerated bilaterally. No focal infiltrate or sizable effusion is seen. Changes are noted in the thoracic spine consistent with the known  history of sickle cell. IMPRESSION: No acute abnormality noted. Electronically Signed   By: Alcide Clever M.D.   On: 09/06/2018 10:01   Dg Chest 2 View  Result Date: 08/27/2018 CLINICAL DATA:  Sickle cell crisis, chest and back pain EXAM: CHEST - 2 VIEW COMPARISON:  05/16/2018 FINDINGS: Stable cardiomegaly and central vascular prominence. No focal pneumonia, collapse or consolidation. Negative for edema, effusion or pneumothorax. Trachea midline. Chronic osseous changes of sickle cell disease. IMPRESSION: Stable cardiomegaly without acute process. Electronically Signed   By: Judie Petit.  Shick M.D.   On: 08/27/2018 18:10   Ct Angio Chest Pe W Or Wo Contrast  Result Date: 09/09/2018 CLINICAL DATA:  17 year old male with history of sickle cell anemia presenting with fever and right anterior chest pain which occurs primarily with deep breathing. Dyspnea. EXAM: CT ANGIOGRAPHY CHEST WITH CONTRAST TECHNIQUE: Multidetector CT imaging of the chest was performed using the standard protocol during bolus administration of intravenous contrast. Multiplanar CT image reconstructions and MIPs were obtained to evaluate the vascular anatomy. CONTRAST:  ISOVUE-370 IOPAMIDOL (ISOVUE-370) INJECTION 76%, 50mL ISOVUE-370 IOPAMIDOL (ISOVUE-370) INJECTION 76% COMPARISON:  Chest CT 08/26/2018. FINDINGS: Comment: Today's study is limited by considerable patient respiratory motion. Cardiovascular: There are no central, lobar or proximal segmental sized filling defects in the pulmonary arterial tree to suggest pulmonary embolism. Unfortunately, respiratory motion prohibits accurate assessment of distal segmental and subsegmental sized vessels in the lungs bilaterally. Heart size is enlarged with left ventricular dilatation. No atherosclerotic calcifications in the thoracic aorta or the coronary arteries. Mediastinum/Nodes: No pathologically enlarged mediastinal or hilar lymph nodes. Esophagus is unremarkable in appearance. No axillary  lymphadenopathy. Lungs/Pleura: Trace bilateral pleural effusions lying dependently. There are areas of volume loss in the lower lobes of the lungs bilaterally, new compared to the recent prior study, compatible with areas of atelectasis. There also appears to be a small amount of airspace consolidation in the right lower lobe best appreciated on axial image 43 of series 10. No definite suspicious appearing pulmonary nodules or masses are noted. Upper Abdomen: Unremarkable. Musculoskeletal: Patchy areas of sclerosis throughout the visualized axial skeleton, as well as H-shaped vertebral bodies, indicative of chronic osseous changes from sickle cell disease. There are no aggressive appearing lytic or blastic lesions noted in the visualized portions of the skeleton. Review of the MIP images confirms the above findings. IMPRESSION: 1. Today's study is limited by significant respiratory motion. With these limitations in mind there is no evidence of central, lobar or proximal segmental sized pulmonary embolism. 2. Right lower lobe airspace consolidation concerning for infection. There also extensive areas of  dependent atelectasis in the lower lobes of the lungs bilaterally. 3. Trace bilateral pleural effusions lying dependently. 4. Cardiomegaly with left ventricular dilatation. Electronically Signed   By: Trudie Reed M.D.   On: 09/09/2018 12:20   Ct Angio Chest Pe W Or Wo Contrast  Result Date: 08/26/2018 CLINICAL DATA:  Chest and back pain in sickle cell crisis EXAM: CT ANGIOGRAPHY CHEST WITH CONTRAST TECHNIQUE: Multidetector CT imaging of the chest was performed using the standard protocol during bolus administration of intravenous contrast. Multiplanar CT image reconstructions and MIPs were obtained to evaluate the vascular anatomy. CONTRAST:  80mL ISOVUE-370 IOPAMIDOL (ISOVUE-370) INJECTION 76% COMPARISON:  CTA chest dated 05/16/2018 FINDINGS: Cardiovascular: Satisfactory opacification the bilateral pulmonary  arteries to the lobar level. No evidence of pulmonary embolism. Enlargement of the main pulmonary artery. No evidence of thoracic aortic aneurysm or dissection. The heart is normal in size.  No pericardial effusion. Mediastinum/Nodes: No suspicious mediastinal lymphadenopathy. Visualized thyroid is unremarkable. Lungs/Pleura: Evaluation of the lung parenchyma is constrained by respiratory motion. No suspicious pulmonary nodules. Mild mosaic attenuation. No focal consolidation. No pleural effusion or pneumothorax. Upper Abdomen: Visualized upper abdomen is notable for a hyperdense spleen, related to sickle cell disease. Musculoskeletal: H-shaped lower thoracic vertebral bodies, with endplate changes related to sickle cell disease. Review of the MIP images confirms the above findings. IMPRESSION: No evidence of pulmonary embolism. No evidence of acute cardiopulmonary disease. Electronically Signed   By: Charline Bills M.D.   On: 08/26/2018 01:23   Ct Abdomen Pelvis W Contrast  Result Date: 08/27/2018 CLINICAL DATA:  Sickle cell pain in chest and back for 2 days. EXAM: CT ABDOMEN AND PELVIS WITH CONTRAST TECHNIQUE: Multidetector CT imaging of the abdomen and pelvis was performed using the standard protocol following bolus administration of intravenous contrast. CONTRAST:  ISOVUE-300 IOPAMIDOL (ISOVUE-300) INJECTION 61% COMPARISON:  Right upper quadrant abdominal ultrasound dated 08/27/2018 FINDINGS: Lower chest: Partially visualized enlarged heart. Linear subpleural airspace consolidation versus atelectasis. Hepatobiliary: No focal liver abnormality is seen. No gallstones, gallbladder wall thickening, or biliary dilatation. Pancreas: Unremarkable. No pancreatic ductal dilatation or surrounding inflammatory changes. Spleen: Small spleen containing multiple circumscribed hypoattenuated lesions. Adrenals/Urinary Tract: Adrenal glands are unremarkable. Kidneys are normal, without renal calculi, focal lesion,  or hydronephrosis. Bladder is unremarkable. Stomach/Bowel: Stomach is within normal limits. Appendix appears normal. No evidence of bowel wall thickening, distention, or inflammatory changes. Vascular/Lymphatic: No significant vascular findings are present. No enlarged abdominal or pelvic lymph nodes. Reproductive: Prostate is unremarkable. Other: No abdominal wall hernia or abnormality. No abdominopelvic ascites. Musculoskeletal: H-shaped vertebral bodies, consistent with the given history of sickle cell disease. IMPRESSION: Partially visualized enlarged heart. Linear subpleural airspace consolidation versus atelectasis in bilateral lung bases. Small spleen containing multiple circumscribed hypoattenuated lesions. Given patient's history these may represent splenic infarcts. Alternatively splenic abscesses may have a similar appearance. H-shaped vertebral bodies, consistent with sickle cell disease changes. Electronically Signed   By: Ted Mcalpine M.D.   On: 08/27/2018 21:08   US Abdomen Limited Ruq  Result Date: 08/27/2018 CLINICAL DATA:  Generalized abdominal pain x2 days EXAM: ULTRASOUND ABDOMEN LIMITED RIGHT UPPER QUADRANT COMPARISON:  09/28/2016 FINDINGS: Gallbladder: No gallstones, gallbladder wall thickening, or pericholecystic fluid. Negative sonographic Murphy's sign. Common bile duct: Diameter: 4 mm Liver: No focal lesion identified. Within normal limits in parenchymal echogenicity. Portal vein is patent on color Doppler imaging with normal direction of blood flow towards the liver. IMPRESSION: Negative right upper quadrant ultrasound. Electronically Signed   By: Lurlean Horns  Rito Ehrlich M.D.   On: 08/27/2018 20:14    Microbiology: Recent Results (from the past 240 hour(s))  Culture, blood (routine x 2)     Status: None   Collection Time: 09/06/18  8:49 AM  Result Value Ref Range Status   Specimen Description   Final    BLOOD RIGHT ARM Performed at [facility], [address], [place], [place] [address]    Special Requests   Final    BOTTLES DRAWN AEROBIC AND ANAEROBIC Blood Culture results may not be optimal due to an excessive volume of blood received in culture bottles Performed at [facility], [address] [place], [place] [address]    Culture   Final    NO GROWTH 5 DAYS Performed at [facility], [address], [place], [place] [address]    Report Status 09/11/2018 FINAL  Final  Culture, blood (routine x 2)     Status: None   Collection Time: 09/06/18  8:49 AM  Result Value Ref Range Status   Specimen Description   Final    BLOOD RIGHT HAND Performed at [facility], [address] [place], [place] [address]    Special Requests   Final    BOTTLES DRAWN AEROBIC AND ANAEROBIC Blood Culture results may not be optimal due to an excessive volume of blood received in culture bottles Performed at [facility], [address], [place], [place] [address]    Culture   Final    NO GROWTH 5 DAYS Performed at [facility], [address] [place], [place] [address]    Report Status 09/11/2018 FINAL  Final  MRSA PCR Screening     Status: None   Collection Time: 09/10/18 11:43 AM  Result Value Ref Range Status   MRSA by PCR NEGATIVE NEGATIVE Final    Comment:        The GeneXpert MRSA Assay (FDA approved for NASAL specimens only), is one component of a comprehensive MRSA colonization surveillance program. It is not intended to diagnose MRSA infection nor to guide or monitor treatment for MRSA infections. Performed at [facility], [address] [place], [place] [address]      Labs: Basic Metabolic Panel: Recent Labs  Lab 09/06/18 0437 09/07/18 0530 09/09/18 0847 09/10/18 1115  NA 140 140 136 140  K 4.6 4.1 3.4* 4.0  CL 105 104 102 106  CO2 25 26 25 25   GLUCOSE 114* 131* 132* 129*  BUN 11 10 6 14   CREATININE 0.64 0.64 0.57* 0.56*   CALCIUM 9.3 9.1 8.0* 8.7*   Liver Function Tests: Recent Labs  Lab 09/06/18 0437 09/07/18 0530 09/09/18 0847  AST 42* 42* 27  ALT 27 28 22   ALKPHOS 83 79 102  BILITOT 2.3* 2.8* 3.1*  PROT 7.7 7.5 6.4*  ALBUMIN 3.9 3.9 3.0*   No results for input(s): LIPASE, AMYLASE in the last 168 hours. No results for input(s): AMMONIA in the last 168 hours. CBC: Recent Labs  Lab 09/06/18 0437 09/07/18 0530 09/08/18 0404 09/09/18 0847 09/10/18 1115 09/11/18 0924  WBC 19.3* 22.1* 20.5* 18.6* 17.7* 20.1*  NEUTROABS 8.4* 15.0*  --  14.1* 14.3* 9.5*  HGB 7.8* 7.6* 7.1* 7.2* 7.6* 7.1*  HCT 22.6* 21.8* 20.6* 20.8* 22.3* 21.4*  MCV 89.7 88.6 88.0 88.1 88.1 90.3  PLT 698* 694* 633* 669* 669* 645*   Cardiac Enzymes: Recent Labs  Lab 09/06/18 0849  TROPONINI <0.03  BNP: Invalid input(s): POCBNP CBG: No results for input(s): GLUCAP in the last 168 hours.  Time coordinating discharge: 50 minutes  Signed: Nolon Nations  APRN, MSN, FNP-C Patient Care [facility] [address] [place], [place] [address] [phone]  [facility] 09/11/2018, 12:42 PM

## 2018-09-11 NOTE — Plan of Care (Signed)
Problem: Education: Goal: Knowledge of vaso-occlusive preventative measures will improve 09/11/2018 1259 by Kerrin Mo, RN Outcome: Completed/Met 09/11/2018 0816 by Kerrin Mo, RN Outcome: Progressing Goal: Awareness of infection prevention will improve 09/11/2018 1259 by Kerrin Mo, RN Outcome: Completed/Met 09/11/2018 0816 by Kerrin Mo, RN Outcome: Progressing Note:  On IV abx for pna.  Goal: Awareness of signs and symptoms of anemia will improve 09/11/2018 1259 by Kerrin Mo, RN Outcome: Completed/Met 09/11/2018 0816 by Kerrin Mo, RN Outcome: Progressing Goal: Long-term complications will improve 09/11/2018 1259 by Kerrin Mo, RN Outcome: Completed/Met 09/11/2018 0816 by Kerrin Mo, RN Outcome: Progressing   Problem: Self-Care: Goal: Ability to incorporate actions that prevent/reduce pain crisis will improve 09/11/2018 1259 by Kerrin Mo, RN Outcome: Completed/Met 09/11/2018 0816 by Kerrin Mo, RN Outcome: Progressing   Problem: Bowel/Gastric: Goal: Gut motility will be maintained 09/11/2018 1259 by Kerrin Mo, RN Outcome: Completed/Met 09/11/2018 0816 by Kerrin Mo, RN Outcome: Progressing   Problem: Tissue Perfusion: Goal: Complications related to inadequate tissue perfusion will be avoided or minimized 09/11/2018 1259 by Kerrin Mo, RN Outcome: Completed/Met 09/11/2018 0816 by Kerrin Mo, RN Outcome: Progressing   Problem: Respiratory: Goal: Pulmonary complications will be avoided or minimized 09/11/2018 1259 by Kerrin Mo, RN Outcome: Completed/Met 09/11/2018 0816 by Kerrin Mo, RN Outcome: Progressing Goal: Acute Chest Syndrome will be identified early to prevent complications 23/76/2831 1259 by Kerrin Mo, RN Outcome: Completed/Met 09/11/2018 0816 by Kerrin Mo, RN Outcome: Progressing    Problem: Fluid Volume: Goal: Ability to maintain a balanced intake and output will improve 09/11/2018 1259 by Kerrin Mo, RN Outcome: Completed/Met 09/11/2018 0816 by Kerrin Mo, RN Outcome: Progressing   Problem: Sensory: Goal: Pain level will decrease with appropriate interventions 09/11/2018 1259 by Kerrin Mo, RN Outcome: Completed/Met 09/11/2018 0816 by Kerrin Mo, RN Outcome: Progressing Note:  Using dilaudid PCA minimally.    Problem: Health Behavior: Goal: Postive changes in compliance with treatment and prescription regimens will improve 09/11/2018 1259 by Kerrin Mo, RN Outcome: Completed/Met 09/11/2018 0816 by Kerrin Mo, RN Outcome: Progressing   Problem: Education: Goal: Knowledge of General Education information will improve Description Including pain rating scale, medication(s)/side effects and non-pharmacologic comfort measures 09/11/2018 1259 by Kerrin Mo, RN Outcome: Completed/Met 09/11/2018 0816 by Kerrin Mo, RN Outcome: Progressing   Problem: Health Behavior/Discharge Planning: Goal: Ability to manage health-related needs will improve 09/11/2018 1259 by Kerrin Mo, RN Outcome: Completed/Met 09/11/2018 0816 by Kerrin Mo, RN Outcome: Progressing   Problem: Clinical Measurements: Goal: Ability to maintain clinical measurements within normal limits will improve 09/11/2018 1259 by Kerrin Mo, RN Outcome: Completed/Met 09/11/2018 0816 by Kerrin Mo, RN Outcome: Progressing Goal: Will remain free from infection 09/11/2018 1259 by Kerrin Mo, RN Outcome: Completed/Met 09/11/2018 0816 by Kerrin Mo, RN Outcome: Progressing Goal: Diagnostic test results will improve 09/11/2018 1259 by Kerrin Mo, RN Outcome: Completed/Met 09/11/2018 0816 by Kerrin Mo, RN Outcome: Progressing Goal: Respiratory  complications will improve 09/11/2018 1259 by Kerrin Mo, RN Outcome: Completed/Met 09/11/2018 0816 by Kerrin Mo, RN Outcome: Progressing Goal: Cardiovascular complication will be avoided 09/11/2018 1259 by Kerrin Mo, RN Outcome: Completed/Met 09/11/2018 0816 by Kerrin Mo, RN Outcome: Progressing   Problem: Activity: Goal: Risk for activity intolerance will decrease 09/11/2018 1259 by Kerrin Mo, RN Outcome: Completed/Met 09/11/2018 0816 by  Kerrin Mo, RN Outcome: Progressing   Problem: Nutrition: Goal: Adequate nutrition will be maintained 09/11/2018 1259 by Kerrin Mo, RN Outcome: Completed/Met 09/11/2018 0816 by Kerrin Mo, RN Outcome: Progressing   Problem: Coping: Goal: Level of anxiety will decrease 09/11/2018 1259 by Kerrin Mo, RN Outcome: Completed/Met 09/11/2018 0816 by Kerrin Mo, RN Outcome: Progressing   Problem: Elimination: Goal: Will not experience complications related to bowel motility 09/11/2018 1259 by Kerrin Mo, RN Outcome: Completed/Met 09/11/2018 0816 by Kerrin Mo, RN Outcome: Progressing Goal: Will not experience complications related to urinary retention 09/11/2018 1259 by Kerrin Mo, RN Outcome: Completed/Met 09/11/2018 0816 by Kerrin Mo, RN Outcome: Progressing   Problem: Pain Managment: Goal: General experience of comfort will improve 09/11/2018 1259 by Kerrin Mo, RN Outcome: Completed/Met 09/11/2018 0816 by Kerrin Mo, RN Outcome: Progressing   Problem: Safety: Goal: Ability to remain free from injury will improve 09/11/2018 1259 by Kerrin Mo, RN Outcome: Completed/Met 09/11/2018 0816 by Kerrin Mo, RN Outcome: Progressing   Problem: Skin Integrity: Goal: Risk for impaired skin integrity will decrease 09/11/2018 1259 by Kerrin Mo, RN Outcome:  Completed/Met 09/11/2018 0816 by Kerrin Mo, RN Outcome: Progressing

## 2018-09-11 NOTE — Progress Notes (Signed)
Dilaudid 20ml wasted from PCA syringe. Witnessed by Jeanette Caprice, Char[employer] fundraiser.

## 2018-09-11 NOTE — Discharge Instructions (Signed)
Follow up in primary care in 1 week. Will call with scheduled appointment.  Continue to drink 64 ounces of fluid per day Avoid stressors that precipitate sickle cell crisis. Follow-up at [facility] cell day hospital in the event of sickle cell crisis.  Hours are 8/12 Monday through Friday for admission.  We typically close around 4:30 PM.  The patient was given clear instructions to go to ER or return to medical center if symptoms do not improve, worsen or new problems develop. The patient verbalized understanding.     Sickle Cell Anemia, Adult Sickle cell anemia is a condition where your red blood cells are shaped like sickles. Red blood cells carry oxygen through the body. Sickle-shaped red blood cells do not live as long as normal red blood cells. They also clump together and block blood from flowing through the blood vessels. These things prevent the body from getting enough oxygen. Sickle cell anemia causes organ damage and pain. It also increases the risk of infection. Follow these instructions at home:  Drink enough fluid to keep your pee (urine) clear or pale yellow. Drink more in hot weather and during exercise.  Do not smoke. Smoking lowers oxygen levels in the blood.  Only take over-the-counter or prescription medicines as told by your doctor.  Take antibiotic medicines as told by your doctor. Make sure you finish them even if you start to feel better.  Take supplements as told by your doctor.  Consider wearing a medical alert bracelet. This tells anyone caring for you in an emergency of your condition.  When traveling, keep your medical information, doctors' names, and the medicines you take with you at all times.  If you have a fever, do not take fever medicines right away. This could cover up a problem. Tell your doctor.  Keep all follow-up visits with your doctor. Sickle cell anemia requires regular medical care. Contact a doctor if: You have a fever. Get help right away  if:  You feel dizzy or faint.  You have new belly (abdominal) pain, especially on the left side near the stomach area.  You have a lasting, often uncomfortable and painful erection of the penis (priapism). If it is not treated right away, you will become unable to have sex (impotence).  You have numbness in your arms or legs or you have a hard time moving them.  You have a hard time talking.  You have a fever or lasting symptoms for more than 2-3 days.  You have a fever and your symptoms suddenly get worse.  You have signs or symptoms of infection. These include: ? Chills. ? Being more tired than normal (lethargy). ? Irritability. ? Poor eating. ? Throwing up (vomiting).  You have pain that is not helped with medicine.  You have shortness of breath.  You have pain in your chest.  You are coughing up pus-like or bloody mucus.  You have a stiff neck.  Your feet or hands swell or have pain.  Your belly looks bloated.  Your joints hurt. This information is not intended to replace advice given to you by your health care provider. Make sure you discuss any questions you have with your health care provider. Document Released: 09/01/2013 Document Revised: 04/18/2016 Document Reviewed: 06/23/2013 Elsevier Interactive Patient Education  2017 [employer].

## 2018-09-15 ENCOUNTER — Telehealth: Payer: Self-pay

## 2018-09-15 NOTE — Telephone Encounter (Signed)
Called and spoke with patient, advised that he has appointment in our office tomorrow. He will keep this appointment. Thanks!

## 2018-09-16 ENCOUNTER — Encounter: Payer: Self-pay | Admitting: Family Medicine

## 2018-09-16 ENCOUNTER — Ambulatory Visit (INDEPENDENT_AMBULATORY_CARE_PROVIDER_SITE_OTHER): Payer: Medicaid Other | Admitting: Family Medicine

## 2018-09-16 VITALS — BP 122/68 | HR 100 | Temp 98.6°F | Ht 66.0 in | Wt 184.0 lb

## 2018-09-16 DIAGNOSIS — D571 Sickle-cell disease without crisis: Secondary | ICD-10-CM | POA: Diagnosis not present

## 2018-09-16 DIAGNOSIS — G894 Chronic pain syndrome: Secondary | ICD-10-CM | POA: Diagnosis not present

## 2018-09-16 DIAGNOSIS — Z09 Encounter for follow-up examination after completed treatment for conditions other than malignant neoplasm: Secondary | ICD-10-CM

## 2018-09-16 DIAGNOSIS — F1190 Opioid use, unspecified, uncomplicated: Secondary | ICD-10-CM

## 2018-09-16 DIAGNOSIS — Z23 Encounter for immunization: Secondary | ICD-10-CM | POA: Diagnosis not present

## 2018-09-16 LAB — POCT URINALYSIS DIP (MANUAL ENTRY)
Bilirubin, UA: NEGATIVE
Blood, UA: NEGATIVE
Glucose, UA: NEGATIVE mg/dL
Ketones, POC UA: NEGATIVE mg/dL
Nitrite, UA: NEGATIVE
Protein Ur, POC: NEGATIVE mg/dL
Spec Grav, UA: 1.015 (ref 1.010–1.025)
Urobilinogen, UA: 0.2 E.U./dL
pH, UA: 6 (ref 5.0–8.0)

## 2018-09-16 NOTE — Progress Notes (Signed)
Sickle Cell Anemia Hospital Follow Up  Subjective:    Patient ID: [name] Hill, male    DOB: 27-Dec-2000, 17 y.o.   MRN: [id number]  Chief Complaint  Patient presents with  . Follow-up    Sickle cell    HPI  [name] Hill is a 17 male patient with a past medical history of Sickle Cell Anemia, Blurred Vision, and Acute Chest Syndrome. He is here today for Hospital Follow Up.   Current Status: Since his last office visit, he is doing well with no complaints. He states that he has pain in his arms and legs. He rates his pain today at 2/10.  He has been having frequent ED hospital visits for Sickle Cell Crisis lately. His last ED visit was on 09/06/2018 where he was treated and discharged on 09/11/2018. He states that he was experiencing chest discomfort at that time. He reports that his crisis pain occasionally starts in the early morning hours. He is currently taking all medications as prescribed and staying well hydrated. He reports occasional dizziness and headaches. He has moderate anxiety today, r/t pain management.   He denies fevers, chills, fatigue, recent infections, weight loss, and night sweats. He has not had any visual changes, and falls. No chest pain, heart palpitations, cough and shortness of breath reported. No reports of GI problems such as nausea, vomiting, diarrhea, and constipation. He has no reports of blood in stools, dysuria and hematuria.   Review of Systems  Constitutional: Negative.   HENT: Negative.   Respiratory: Negative.   Gastrointestinal: Negative.   Genitourinary: Negative.   Musculoskeletal: Positive for arthralgias (arms and legs).  Skin: Negative.   Neurological: Negative.   Psychiatric/Behavioral:       Moderate anxiety r/t health status.    Objective:   Physical Exam  Constitutional: He is oriented to person, place, and time. He appears well-developed and well-nourished.  HENT:  Head: Normocephalic and atraumatic.  Right Ear: External ear normal.   Left Ear: External ear normal.  Nose: Nose normal.  Mouth/Throat: Oropharynx is clear and moist.  Eyes: Pupils are equal, round, and reactive to light. Conjunctivae and EOM are normal.  Neck: Normal range of motion. Neck supple.  Cardiovascular: Normal rate, regular rhythm, normal heart sounds and intact distal pulses.  Pulmonary/Chest: Effort normal and breath sounds normal.  Abdominal: Soft. Bowel sounds are normal.  Musculoskeletal: Normal range of motion.  Neurological: He is alert and oriented to person, place, and time.  Skin: Skin is warm and dry.  Psychiatric: He has a normal mood and affect. His behavior is normal. Judgment and thought content normal.  Nursing note and vitals reviewed.  Assessment & Plan:   1. Hospital discharge follow-up Stable today.  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 We will increase Oxy-IR to 20 mg every 4 hours for better pain management at home, to minimize hospital visits.   He will continue to take pain medications as prescribed; will continue to avoid extreme heat and cold; will continue to eat a healthy diet and drink at least 64 ounces of water daily; continue stool softener as needed; will avoid colds and flu; will continue to get plenty of sleep and rest; will continue to avoid high stressful situations and remain infection free; will continue Folic Acid 1 mg daily to avoid sickle cell crisis.  - POCT urinalysis dipstick  3. Need for immunization against influenza - Flu Vaccine QUAD 36+ mos IM  4. Chronic pain  syndrome  5. Chronic, continuous use of opioids  6. Follow up He will follow up in 2 months.   Meds ordered this encounter  Medications  . DISCONTD: Oxycodone HCl 20 MG TABS    Sig: Take 20 tablets (400 mg total) by mouth every 4 (four) hours as needed.    Dispense:  90 tablet    Refill:  0    Dose Increase.    Order Specific Question:   Supervising Provider    Answer:   Quentin Angst  [id number]  . DISCONTD: Oxycodone HCl 20 MG TABS    Sig: Take 1 tablet (20 mg total) by mouth every 4 (four) hours as needed.    Dispense:  90 tablet    Refill:  0    Order Specific Question:   Supervising Provider    Answer:   Quentin Angst [id number]  . Oxycodone HCl 20 MG TABS    Sig: Take 1 tablet (20 mg total) by mouth every 4 (four) hours as needed for up to 15 days.    Dispense:  90 tablet    Refill:  0    Order Specific Question:   Supervising Provider    Answer:   Quentin Angst [[id number]]   Raliegh Ip,  MSN, FNP-C [facility] [address] [place], [place] [address] [phone]

## 2018-09-18 ENCOUNTER — Telehealth: Payer: Self-pay

## 2018-09-18 MED ORDER — OXYCODONE HCL 20 MG PO TABS: 1.0000 | ORAL_TABLET | ORAL | 0 refills | Status: DC | PRN

## 2018-09-18 MED ORDER — OXYCODONE HCL 20 MG PO TABS: 20.0000 | ORAL_TABLET | ORAL | 0 refills | Status: DC | PRN

## 2018-09-18 MED ORDER — OXYCODONE HCL 20 MG PO TABS: 1.0000 | ORAL_TABLET | ORAL | 0 refills | Status: AC | PRN

## 2018-09-18 NOTE — Telephone Encounter (Signed)
PA has been submitted for Oxycodone. 

## 2018-11-10 ENCOUNTER — Encounter (HOSPITAL_COMMUNITY): Payer: Self-pay | Admitting: Emergency Medicine

## 2018-11-10 ENCOUNTER — Telehealth (HOSPITAL_COMMUNITY): Payer: Self-pay | Admitting: General Practice

## 2018-11-10 ENCOUNTER — Emergency Department (HOSPITAL_COMMUNITY)
Admission: EM | Admit: 2018-11-10 | Discharge: 2018-11-10 | Disposition: A | Discharge status: Home / Self Care | Payer: Medicaid Other | Attending: Emergency Medicine | Admitting: Emergency Medicine

## 2018-11-10 DIAGNOSIS — M25531 Pain in right wrist: Secondary | ICD-10-CM | POA: Diagnosis present

## 2018-11-10 DIAGNOSIS — I10 Essential (primary) hypertension: Secondary | ICD-10-CM | POA: Diagnosis not present

## 2018-11-10 DIAGNOSIS — Z79899 Other long term (current) drug therapy: Secondary | ICD-10-CM | POA: Diagnosis not present

## 2018-11-10 DIAGNOSIS — D5700 Hb-SS disease with crisis, unspecified: Secondary | ICD-10-CM | POA: Insufficient documentation

## 2018-11-10 DIAGNOSIS — J45909 Unspecified asthma, uncomplicated: Secondary | ICD-10-CM | POA: Diagnosis not present

## 2018-11-10 LAB — BASIC METABOLIC PANEL
Anion gap: 8 (ref 5–15)
BUN: 11 mg/dL (ref 6–20)
CO2: 27 mmol/L (ref 22–32)
Calcium: 8.9 mg/dL (ref 8.9–10.3)
Chloride: 99 mmol/L (ref 98–111)
Creatinine, Ser: 0.69 mg/dL (ref 0.61–1.24)
GFR calc Af Amer: >60 mL/min (ref >60)
GFR calc non Af Amer: >60 mL/min (ref >60)
Glucose, Bld: 85 mg/dL (ref 70–99)
Potassium: 3.9 mmol/L (ref 3.5–5.1)
Sodium: 134 mmol/L — ABNORMAL LOW (ref 135–145)

## 2018-11-10 LAB — CBC WITH DIFFERENTIAL/PLATELET
Abs Immature Granulocytes: 0.03 10*3/uL (ref 0.00–0.07)
Basophils Absolute: 0.1 10*3/uL (ref 0.0–0.1)
Basophils Relative: 1 %
Eosinophils Absolute: 0.2 10*3/uL (ref 0.0–0.5)
Eosinophils Relative: 2 %
HCT: 24.4 % — ABNORMAL LOW (ref 39.0–52.0)
Hemoglobin: 8.7 g/dL — ABNORMAL LOW (ref 13.0–17.0)
Immature Granulocytes: 0 %
Lymphocytes Relative: 50 %
Lymphs Abs: 4.1 10*3/uL — ABNORMAL HIGH (ref 0.7–4.0)
MCH: 33.9 pg (ref 26.0–34.0)
MCHC: 35.7 g/dL (ref 30.0–36.0)
MCV: 94.9 fL (ref 80.0–100.0)
Monocytes Absolute: 0.9 10*3/uL (ref 0.1–1.0)
Monocytes Relative: 11 %
Neutro Abs: 2.9 10*3/uL (ref 1.7–7.7)
Neutrophils Relative %: 36 %
Platelets: 307 10*3/uL (ref 150–400)
RBC: 2.57 MIL/uL — ABNORMAL LOW (ref 4.22–5.81)
RDW: 17.9 % — ABNORMAL HIGH (ref 11.5–15.5)
WBC: 8.2 10*3/uL (ref 4.0–10.5)
nRBC: 0.5 % — ABNORMAL HIGH (ref 0.0–0.2)

## 2018-11-10 LAB — RETICULOCYTES
Immature Retic Fract: 31.8 % — ABNORMAL HIGH (ref 2.3–15.9)
RBC.: 2.57 MIL/uL — ABNORMAL LOW (ref 4.22–5.81)
Retic Count, Absolute: 79.6 10*3/uL (ref 19.0–186.0)
Retic Ct Pct: 6 % — ABNORMAL HIGH (ref 0.4–3.1)

## 2018-11-10 MED ORDER — KETOROLAC TROMETHAMINE 15 MG/ML IJ SOLN
15.0000 mg | Freq: Once | INTRAMUSCULAR | Status: AC
  Administered 2018-11-10: 15 mg via INTRAVENOUS
  Filled 2018-11-10: qty 1

## 2018-11-10 MED ORDER — HYDROMORPHONE HCL 2 MG/ML IJ SOLN: 2.0000 mg | INTRAMUSCULAR | Status: DC

## 2018-11-10 MED ORDER — HYDROMORPHONE HCL 2 MG/ML IJ SOLN: 2.0000 mg | INTRAMUSCULAR | Status: AC

## 2018-11-10 MED ORDER — DEXTROSE-NACL 5-0.45 % IV SOLN
INTRAVENOUS | Status: DC
  Administered 2018-11-10: 13:00:00 via INTRAVENOUS

## 2018-11-10 MED ORDER — HYDROMORPHONE HCL 2 MG/ML IJ SOLN
2.0000 mg | INTRAMUSCULAR | Status: AC
  Administered 2018-11-10: 2 mg via INTRAVENOUS
  Filled 2018-11-10: qty 1

## 2018-11-10 MED ORDER — DIPHENHYDRAMINE HCL 25 MG PO CAPS
25.0000 mg | ORAL_CAPSULE | ORAL | Status: DC | PRN
  Administered 2018-11-10 (×2): 25 mg via ORAL
  Filled 2018-11-10 (×2): qty 1

## 2018-11-10 MED ORDER — ONDANSETRON HCL 4 MG/2ML IJ SOLN
4.0000 mg | INTRAMUSCULAR | Status: DC | PRN
  Administered 2018-11-10: 4 mg via INTRAVENOUS
  Filled 2018-11-10: qty 2

## 2018-11-10 NOTE — ED Triage Notes (Signed)
Pt c/o bilat wrist sickle cell pains. reports clinic sent to ED for pain management.

## 2018-11-10 NOTE — ED Provider Notes (Signed)
[facility]-EMERGENCY DEPT Provider Note   CSN: [id number] Arrival date & time: 11/10/18  0944     History   Chief Complaint Chief Complaint  Patient presents with  . Sickle Cell Pain Crisis  . Wrist Pain    bilat    HPI [name] Hill is a 17 y.o. male.  HPI   17 year old male past medical history as below here with bilateral wrist pain.  The patient states that over the last day, he has had progressively worsening aching, throbbing, bilateral wrist pain.  The patient has sickle cell with history of previous crises in bilateral wrists.  He states he works with his hands a lot at work and this, in combination with weather changes, may have precipitated an acute crisis.  He is been taking oxycodone 10 mg every 4-6 hours at home without significant relief.  Denies any fevers.  No increased trauma.  No redness or warmth of bilateral joints.  No chest pain or shortness of breath.  No abdominal pain, nausea, or vomiting.  Pain is worse with any kind of moving or palpation.  No alleviating factors.  Past Medical History:  Diagnosis Date  . 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 Blurred vision   . HCAP (healthcare-associated pneumonia) 08/27/2018  . Sickle cell anemia (HCC)   . Sickle cell crisis (HCC)   . 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(HCC)   . Chronic pain syndrome   . Chronic, continuous use of opioids   . Fever   . Sickle cell anemia with crisis (HCC) 09/06/2018  . Generalized abdominal pain   . Abnormal CT of the abdomen   . 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) 08/27/2018  . Hypertension 08/27/2018  . HCAP (healthcare-associated pneumonia) 08/27/2018  . Acute chest pain   . Acute chest syndrome (HCC) 05/16/2018  . Leukocytosis   . Transaminitis   . 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 Family circumstance 07/03/2016  . Hypoxia   . Sickle cell crisis (HCC) 10/29/2015  . 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 Asthma 06/03/2012  . Abnormal presence of protein in urine 06/03/2012  . Sickle cell pain crisis (HCC) 04/21/2012    Past Surgical History:  Procedure Laterality Date  . CIRCUMCISION    . TONSILLECTOMY    . TONSILLECTOMY AND ADENOIDECTOMY          Home Medications    Prior to Admission medications   Medication Sig Start Date End Date Taking? Authorizing Provider  albuterol (PROVENTIL HFA;VENTOLIN HFA) 108 (90 Base) MCG/ACT inhaler Inhale 2 puffs into the lungs every 4 (four) hours as needed for wheezing or shortness of breath. 09/11/18  Yes Massie Maroon, FNP  hydroxyurea (DROXIA) 400 MG capsule Take 4 capsules (1,600 mg total) by mouth daily. 10/05/16  Yes [name], MD  lisinopril (PRINIVIL,ZESTRIL) 10 MG tablet Take 1 tablet (10 mg total) by mouth daily. 10/05/16  Yes [name], MD  Oxycodone HCl 10 MG TABS Take 10 mg by mouth every 4 (four) hours as needed (pain).   Yes [provider]    Family History Family History  Problem Relation Age of Onset  . Sickle cell anemia Brother   . Hypertension Maternal Grandmother   . Hyperlipidemia Maternal Grandmother   .  Anemia Mother     Social History Social History   Tobacco Use  . Smoking status: Never Smoker  . Smokeless tobacco: Never Used  Substance Use Topics  . Alcohol use: No  . Drug use: No     Allergies   Other   Review of Systems Review of Systems  Constitutional: Negative for chills, fatigue and fever.  HENT: Negative for congestion and rhinorrhea.   Eyes: Negative for visual disturbance.  Respiratory: Negative for cough, shortness of breath and wheezing.   Cardiovascular: Negative for chest pain and leg swelling.  Gastrointestinal: Negative for abdominal pain, diarrhea, nausea and vomiting.  Genitourinary:  Negative for dysuria and flank pain.  Musculoskeletal: Positive for arthralgias. Negative for neck pain and neck stiffness.  Skin: Negative for rash and wound.  Allergic/Immunologic: Negative for immunocompromised state.  Neurological: Negative for syncope, weakness and headaches.  All other systems reviewed and are negative.    Physical Exam Updated Vital Signs BP (!) 122/51   Pulse 79   Temp 98.8 F (37.1 C) (Oral)   Resp 17   SpO2 95%   Physical Exam Vitals signs and nursing note reviewed.  Constitutional:      General: He is not in acute distress.    Appearance: He is well-developed.  HENT:     Head: Normocephalic and atraumatic.  Eyes:     Conjunctiva/sclera: Conjunctivae normal.  Neck:     Musculoskeletal: Neck supple.  Cardiovascular:     Rate and Rhythm: Normal rate and regular rhythm.     Heart sounds: Normal heart sounds. No murmur. No friction rub.  Pulmonary:     Effort: Pulmonary effort is normal. No respiratory distress.     Breath sounds: Normal breath sounds. No wheezing or rales.  Abdominal:     General: There is no distension.  Musculoskeletal:     Comments: Mild tenderness to palpation over radial aspect of bilateral wrists, with no appreciable joint warmth or swelling.  Range of motion minimally painful bilateral wrist.  Skin:    General: Skin is warm.     Capillary Refill: Capillary refill takes less than 2 seconds.  Neurological:     Mental Status: He is alert and oriented to person, place, and time.     Motor: No abnormal muscle tone.     Comments: Strength 5 and 5 bilateral upper extremities.  Normal sensation to light touch.      ED Treatments / Results  Labs (all labs ordered are listed, but only abnormal results are displayed) Labs Reviewed  CBC WITH DIFFERENTIAL/PLATELET - Abnormal; Notable for the following components:      Result Value   RBC 2.57 (*)    Hemoglobin 8.7 (*)    HCT 24.4 (*)    RDW 17.9 (*)    nRBC 0.5 (*)     Lymphs Abs 4.1 (*)    All other components within normal limits  BASIC METABOLIC PANEL - Abnormal; Notable for the following components:   Sodium 134 (*)    All other components within normal limits  RETICULOCYTES - Abnormal; Notable for the following components:   Retic Ct Pct 6.0 (*)    RBC. 2.57 (*)    Immature Retic Fract 31.8 (*)    All other components within normal limits    EKG None  Radiology No results found.  Procedures Procedures (including critical care time)  Medications Ordered in ED Medications  HYDROmorphone (DILAUDID) injection 2 mg (2 mg Intravenous Given 11/10/18 1429)  Or  HYDROmorphone (DILAUDID) injection 2 mg ( Subcutaneous See Alternative 11/10/18 1429)  HYDROmorphone (DILAUDID) injection 2 mg (has no administration in time range)    Or  HYDROmorphone (DILAUDID) injection 2 mg (has no administration in time range)  diphenhydrAMINE (BENADRYL) capsule 25-50 mg (25 mg Oral Given 11/10/18 1433)  ondansetron (ZOFRAN) injection 4 mg (4 mg Intravenous Given 11/10/18 1308)  dextrose 5 %-0.45 % sodium chloride infusion ( Intravenous New Bag/Given 11/10/18 1307)  HYDROmorphone (DILAUDID) injection 2 mg (2 mg Intravenous Given 11/10/18 1308)    Or  HYDROmorphone (DILAUDID) injection 2 mg ( Subcutaneous See Alternative 11/10/18 1308)  HYDROmorphone (DILAUDID) injection 2 mg (2 mg Intravenous Given 11/10/18 1340)    Or  HYDROmorphone (DILAUDID) injection 2 mg ( Subcutaneous See Alternative 11/10/18 1340)  ketorolac (TORADOL) 15 MG/ML injection 15 mg (15 mg Intravenous Given 11/10/18 1429)     Initial Impression / Assessment and Plan / ED Course  I have reviewed the triage vital signs and the nursing notes.  Pertinent labs & imaging results that were available during my care of the patient were reviewed by me and considered in my medical decision making (see chart for details).  Clinical Course as of Nov 10 1509  Tue Nov 10, 2018  1244 17 yo M with HbSS  sickle cell disease here w/ bilateral wrist pain, similar to his prior crises. No joint warmth, swelling, or sx to suggest septic arthritis and sx are bilateral. No fever or other red flags. Will start fluids, analgesia, re-assess.   [CI]  1440 Feeling much better. Labs reassuring/at baseline. Will give third dose, toradol, d/c.   [CI]    Clinical Course User Index [CI] Shaune Pollack, MD      Final Clinical Impressions(s) / ED Diagnoses   Final diagnoses:  Sickle cell pain crisis [facility])    ED Discharge Orders    None       Shaune Pollack, MD 11/10/18 1510

## 2018-11-10 NOTE — Telephone Encounter (Signed)
Patient came to the patient care center to be triaged. However, the center was already full as determined by the provider. Patient told to take his prescribed pain medications as ordered, stay hydrated with 64 ounces of water and to go the ER if conditions do not improve. Patient verbalized understanding.

## 2018-11-19 ENCOUNTER — Ambulatory Visit (HOSPITAL_COMMUNITY)
Admission: EM | Admit: 2018-11-19 | Discharge: 2018-11-19 | Disposition: A | Discharge status: Home / Self Care | Payer: Medicaid Other | Attending: Family Medicine | Admitting: Family Medicine

## 2018-11-19 ENCOUNTER — Encounter (HOSPITAL_COMMUNITY): Payer: Self-pay

## 2018-11-19 DIAGNOSIS — R69 Illness, unspecified: Secondary | ICD-10-CM | POA: Diagnosis not present

## 2018-11-19 DIAGNOSIS — J111 Influenza due to unidentified influenza virus with other respiratory manifestations: Secondary | ICD-10-CM

## 2018-11-19 MED ORDER — OSELTAMIVIR PHOSPHATE 75 MG PO CAPS: 75.0000 mg | ORAL_CAPSULE | Freq: Two times a day (BID) | ORAL | 0 refills | Status: DC

## 2018-11-19 NOTE — Discharge Instructions (Addendum)
Drink lots of water It is important to NOT become dehydrated with the flu Take ibuprofen or tylenol for pain and fever Take the tamiflu 2 x a day for 5 days Take 2 doses today Return here or TO ER if worse

## 2018-11-19 NOTE — ED Provider Notes (Signed)
[facility]    CSN: [id number] Arrival date & time: 11/19/18  1041     History   Chief Complaint Chief Complaint  Patient presents with  . Generalized Body Aches  . Cough    HPI [name] is a 17 y.o. male.   HPI  Brother currently being treated for influenza Here with GM He has had body aches fever and chills since yesterday, mild cough.  NO sore throat or runny nose.  HE did have a flushot in October Is eating and drinking normally No NVD   Past Medical History:  Diagnosis Date  . 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 Blurred vision   . HCAP (healthcare-associated pneumonia) 08/27/2018  . Sickle cell anemia (HCC)   . Sickle cell crisis (HCC)   . Sickle cell nephropathy [facility](HCC)     Patient Active Problem List   Diagnosis Date Noted  . 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(HCC)   . Chronic pain syndrome   . Chronic, continuous use of opioids   . Fever   . Sickle cell anemia with crisis (HCC) 09/06/2018  . Generalized abdominal pain   . Abnormal CT of the abdomen   . 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) 08/27/2018  . Hypertension 08/27/2018  . HCAP (healthcare-associated pneumonia) 08/27/2018  . Acute chest pain   . Acute chest syndrome (HCC) 05/16/2018  . Leukocytosis   . Transaminitis   . 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 Family circumstance 07/03/2016  . Hypoxia   . Sickle cell crisis (HCC) 10/29/2015  . 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 Asthma 06/03/2012  . Abnormal presence of protein in urine 06/03/2012  . Sickle cell pain crisis (HCC) 04/21/2012    Past Surgical History:  Procedure Laterality Date  . CIRCUMCISION    . TONSILLECTOMY    . TONSILLECTOMY AND ADENOIDECTOMY         Home Medications    Prior to Admission medications   Medication Sig Start Date End Date Taking? Authorizing Provider  albuterol  (PROVENTIL HFA;VENTOLIN HFA) 108 (90 Base) MCG/ACT inhaler Inhale 2 puffs into the lungs every 4 (four) hours as needed for wheezing or shortness of breath. 09/11/18   Massie MaroonHollis, Lachina M, FNP  hydroxyurea (DROXIA) 400 MG capsule Take 4 capsules (1,600 mg total) by mouth daily. 10/05/16   Lelan PonsNewman, Caroline, MD  lisinopril (PRINIVIL,ZESTRIL) 10 MG tablet Take 1 tablet (10 mg total) by mouth daily. 10/05/16   Lelan PonsNewman, Caroline, MD  oseltamivir (TAMIFLU) 75 MG capsule Take 1 capsule (75 mg total) by mouth every 12 (twelve) hours. 11/19/18   Eustace MooreNelson, Cleotha Tsang Sue, MD  Oxycodone HCl 10 MG TABS Take 10 mg by mouth every 4 (four) hours as needed (pain).    [provider]    Family History Family History  Problem Relation Age of Onset  . Sickle cell anemia Brother   . Hypertension Maternal Grandmother   . Hyperlipidemia Maternal Grandmother   . Anemia Mother     Social History Social History   Tobacco Use  . Smoking status: Never Smoker  . Smokeless tobacco: Never Used  Substance Use Topics  . Alcohol use: No  . Drug use: No     Allergies   Other   Review of Systems Review of Systems  Constitutional: Positive for chills, fatigue and fever.  HENT: Negative for ear pain and  sore throat.   Eyes: Negative for pain and visual disturbance.  Respiratory: Positive for cough. Negative for shortness of breath.   Cardiovascular: Negative for chest pain and palpitations.  Gastrointestinal: Negative for abdominal pain and vomiting.  Genitourinary: Negative for dysuria and hematuria.  Musculoskeletal: Positive for myalgias. Negative for arthralgias and back pain.  Skin: Negative for color change and rash.  Neurological: Negative for seizures and syncope.  All other systems reviewed and are negative.    Physical Exam Triage Vital Signs ED Triage Vitals  Enc Vitals Group     BP 11/19/18 1204 (!) 117/50     Pulse Rate 11/19/18 1204 (!) 104     Resp 11/19/18 1204 18     Temp 11/19/18  1204 99.4 F (37.4 C)     Temp Source 11/19/18 1204 Oral     SpO2 11/19/18 1204 98 %     Weight --      Height 11/19/18 1205 5\' 7"  (1.702 m)     Head Circumference --      Peak Flow --      Pain Score 11/19/18 1205 0     Pain Loc --      Pain Edu? --      Excl. in GC? --    No data found.  Updated Vital Signs BP (!) 117/50 (BP Location: Right Arm)   Pulse (!) 104   Temp 99.4 F (37.4 C) (Oral)   Resp 18   Ht 5\' 7"  (1.702 m)   SpO2 98%   BMI 28.82 kg/m      Physical Exam Constitutional:      Appearance: He is well-developed. He is ill-appearing.     Comments: Appears ill, tired  HENT:     Head: Normocephalic and atraumatic.     Right Ear: Tympanic membrane and ear canal normal.     Left Ear: Tympanic membrane, ear canal and external ear normal.     Nose: Nose normal.     Mouth/Throat:     Mouth: Mucous membranes are moist.     Pharynx: No posterior oropharyngeal erythema.  Eyes:     Conjunctiva/sclera: Conjunctivae normal.     Pupils: Pupils are equal, round, and reactive to light.  Neck:     Musculoskeletal: Normal range of motion.  Cardiovascular:     Rate and Rhythm: Tachycardia present.     Heart sounds: Normal heart sounds.  Pulmonary:     Effort: Pulmonary effort is normal. No respiratory distress.     Breath sounds: Normal breath sounds.  Abdominal:     General: Abdomen is flat. There is no distension.     Palpations: Abdomen is soft.     Tenderness: There is no abdominal tenderness.  Musculoskeletal: Normal range of motion.  Lymphadenopathy:     Cervical: No cervical adenopathy.  Skin:    General: Skin is warm and dry.  Neurological:     General: No focal deficit present.     Mental Status: He is alert. Mental status is at baseline.  Psychiatric:        Mood and Affect: Mood normal.        Thought Content: Thought content normal.      UC Treatments / Results  Labs (all labs ordered are listed, but only abnormal results are displayed) Labs  Reviewed - No data to display  EKG None  Radiology No results found.  Procedures Procedures (including critical care time)  Medications Ordered in UC Medications -  No data to display  Initial Impression / Assessment and Plan / UC Course  I have reviewed the triage vital signs and the nursing notes.  Pertinent labs & imaging results that were available during my care of the patient were reviewed by me and considered in my medical decision making (see chart for details).     Discussed influenza like illness in spite of the flu shot Final Clinical Impressions(s) / UC Diagnoses   Final diagnoses:  Influenza-like illness     Discharge Instructions     Drink lots of water It is important to NOT become dehydrated with the flu Take ibuprofen or tylenol for pain and fever Take the tamiflu 2 x a day for 5 days Take 2 doses today Return here or TO ER if worse   ED Prescriptions    Medication Sig Dispense Auth. Provider   oseltamivir (TAMIFLU) 75 MG capsule Take 1 capsule (75 mg total) by mouth every 12 (twelve) hours. 10 capsule Eustace MooreNelson, Quilla Freeze Sue, MD     Controlled Substance Prescriptions [place] Controlled Substance Registry consulted? Not Applicable   Eustace MooreNelson, Treazure Nery Sue, MD 11/19/18 [phone]

## 2018-11-19 NOTE — ED Triage Notes (Signed)
Pt present body aches, headaches and fever. Symptoms started yesterday.

## 2018-11-27 ENCOUNTER — Ambulatory Visit: Payer: Medicaid Other | Admitting: Family Medicine

## 2018-12-02 DIAGNOSIS — D571 Sickle-cell disease without crisis: Secondary | ICD-10-CM | POA: Diagnosis not present

## 2018-12-02 DIAGNOSIS — R809 Proteinuria, unspecified: Secondary | ICD-10-CM | POA: Diagnosis not present

## 2019-03-04 DIAGNOSIS — D571 Sickle-cell disease without crisis: Secondary | ICD-10-CM | POA: Diagnosis not present

## 2019-03-12 NOTE — Telephone Encounter (Signed)
error 

## 2019-04-19 ENCOUNTER — Encounter (HOSPITAL_COMMUNITY): Payer: Self-pay | Admitting: Emergency Medicine

## 2019-04-19 ENCOUNTER — Other Ambulatory Visit: Payer: Self-pay

## 2019-04-19 ENCOUNTER — Emergency Department (HOSPITAL_COMMUNITY)
Admission: EM | Admit: 2019-04-19 | Discharge: 2019-04-19 | Disposition: A | Discharge status: Home / Self Care | Payer: Medicaid Other | Attending: Emergency Medicine | Admitting: Emergency Medicine

## 2019-04-19 DIAGNOSIS — D5700 Hb-SS disease with crisis, unspecified: Secondary | ICD-10-CM | POA: Insufficient documentation

## 2019-04-19 DIAGNOSIS — J45909 Unspecified asthma, uncomplicated: Secondary | ICD-10-CM | POA: Insufficient documentation

## 2019-04-19 DIAGNOSIS — M5489 Other dorsalgia: Secondary | ICD-10-CM | POA: Diagnosis present

## 2019-04-19 DIAGNOSIS — Z79899 Other long term (current) drug therapy: Secondary | ICD-10-CM | POA: Insufficient documentation

## 2019-04-19 DIAGNOSIS — I10 Essential (primary) hypertension: Secondary | ICD-10-CM | POA: Insufficient documentation

## 2019-04-19 LAB — CBC WITH DIFFERENTIAL/PLATELET
Abs Immature Granulocytes: 0.04 10*3/uL (ref 0.00–0.07)
Basophils Absolute: 0.1 10*3/uL (ref 0.0–0.1)
Basophils Relative: 1 %
Eosinophils Absolute: 0.2 10*3/uL (ref 0.0–0.5)
Eosinophils Relative: 2 %
HCT: 27.3 % — ABNORMAL LOW (ref 39.0–52.0)
Hemoglobin: 9.8 g/dL — ABNORMAL LOW (ref 13.0–17.0)
Immature Granulocytes: 0 %
Lymphocytes Relative: 49 %
Lymphs Abs: 6.1 10*3/uL — ABNORMAL HIGH (ref 0.7–4.0)
MCH: 34.5 pg — ABNORMAL HIGH (ref 26.0–34.0)
MCHC: 35.9 g/dL (ref 30.0–36.0)
MCV: 96.1 fL (ref 80.0–100.0)
Monocytes Absolute: 1.4 10*3/uL — ABNORMAL HIGH (ref 0.1–1.0)
Monocytes Relative: 12 %
Neutro Abs: 4.4 10*3/uL (ref 1.7–7.7)
Neutrophils Relative %: 36 %
Platelets: 443 10*3/uL — ABNORMAL HIGH (ref 150–400)
RBC: 2.84 MIL/uL — ABNORMAL LOW (ref 4.22–5.81)
RDW: 16.8 % — ABNORMAL HIGH (ref 11.5–15.5)
WBC: 12.2 10*3/uL — ABNORMAL HIGH (ref 4.0–10.5)
nRBC: 0.7 % — ABNORMAL HIGH (ref 0.0–0.2)

## 2019-04-19 LAB — COMPREHENSIVE METABOLIC PANEL
ALT: 26 U/L (ref 0–44)
AST: 40 U/L (ref 15–41)
Albumin: 4.5 g/dL (ref 3.5–5.0)
Alkaline Phosphatase: 100 U/L (ref 38–126)
Anion gap: 9 (ref 5–15)
BUN: 12 mg/dL (ref 6–20)
CO2: 24 mmol/L (ref 22–32)
Calcium: 9.1 mg/dL (ref 8.9–10.3)
Chloride: 106 mmol/L (ref 98–111)
Creatinine, Ser: 0.71 mg/dL (ref 0.61–1.24)
GFR calc Af Amer: >60 mL/min (ref >60)
GFR calc non Af Amer: >60 mL/min (ref >60)
Glucose, Bld: 94 mg/dL (ref 70–99)
Potassium: 4.1 mmol/L (ref 3.5–5.1)
Sodium: 139 mmol/L (ref 135–145)
Total Bilirubin: 2.4 mg/dL — ABNORMAL HIGH (ref 0.3–1.2)
Total Protein: 7.7 g/dL (ref 6.5–8.1)

## 2019-04-19 LAB — RETICULOCYTES
Immature Retic Fract: 33.5 % — ABNORMAL HIGH (ref 2.3–15.9)
RBC.: 2.84 MIL/uL — ABNORMAL LOW (ref 4.22–5.81)
Retic Count, Absolute: 190.6 10*3/uL — ABNORMAL HIGH (ref 19.0–186.0)
Retic Ct Pct: 6.7 % — ABNORMAL HIGH (ref 0.4–3.1)

## 2019-04-19 MED ORDER — HYDROMORPHONE HCL 1 MG/ML IJ SOLN: 1.0000 mg | INTRAMUSCULAR | Status: DC

## 2019-04-19 MED ORDER — HYDROMORPHONE HCL 1 MG/ML IJ SOLN
0.5000 mg | INTRAMUSCULAR | Status: AC
  Administered 2019-04-19: 18:00:00 0.5 mg via INTRAVENOUS
  Filled 2019-04-19: qty 1

## 2019-04-19 MED ORDER — HYDROMORPHONE HCL 1 MG/ML IJ SOLN: 0.5000 mg | INTRAMUSCULAR | Status: AC

## 2019-04-19 MED ORDER — KETOROLAC TROMETHAMINE 30 MG/ML IJ SOLN
30.0000 mg | INTRAMUSCULAR | Status: AC
  Administered 2019-04-19: 18:00:00 30 mg via INTRAVENOUS
  Filled 2019-04-19: qty 1

## 2019-04-19 MED ORDER — DEXTROSE-NACL 5-0.45 % IV SOLN
INTRAVENOUS | Status: DC
  Administered 2019-04-19: 18:00:00 via INTRAVENOUS

## 2019-04-19 MED ORDER — HYDROMORPHONE HCL 1 MG/ML IJ SOLN
1.0000 mg | INTRAMUSCULAR | Status: AC
  Filled 2019-04-19: qty 1

## 2019-04-19 MED ORDER — HYDROMORPHONE HCL 1 MG/ML IJ SOLN: 1.0000 mg | INTRAMUSCULAR | Status: AC

## 2019-04-19 MED ORDER — HYDROMORPHONE HCL 1 MG/ML IJ SOLN
1.0000 mg | INTRAMUSCULAR | Status: DC
  Administered 2019-04-19: 19:00:00 1 mg via INTRAVENOUS

## 2019-04-19 MED ORDER — HYDROMORPHONE HCL 1 MG/ML IJ SOLN
1.0000 mg | INTRAMUSCULAR | Status: AC
  Administered 2019-04-19: 20:00:00 1 mg via INTRAVENOUS

## 2019-04-19 NOTE — ED Triage Notes (Signed)
Pt c/o sickle cell pain in back that started this morning.

## 2019-04-19 NOTE — ED Provider Notes (Signed)
[facility]-EMERGENCY DEPT Provider Note   CSN: [id number] Arrival date & time: 04/19/19  1550    History   Chief Complaint Chief Complaint  Patient presents with  . Sickle Cell Pain Crisis  . Back Pain    HPI [name] Hill is a 18 y.o. male.     Patient is a 18 year old male who presents with a sickle cell crisis.  She has pain in her left upper back that started earlier today.  She is taking her home medications without improvement in symptoms.  She states she has had similar type pain previously with sickle cell disease.  She denies any chest pain or shortness of breath.  No fevers.  No cough or cold symptoms.  No vomiting or diarrhea.     Past Medical History:  Diagnosis Date  . 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 Blurred vision   . HCAP (healthcare-associated pneumonia) 08/27/2018  . Sickle cell anemia (HCC)   . Sickle cell crisis (HCC)   . 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(HCC)   . Chronic pain syndrome   . Chronic, continuous use of opioids   . Fever   . Sickle cell anemia with crisis (HCC) 09/06/2018  . Generalized abdominal pain   . Abnormal CT of the abdomen   . 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) 08/27/2018  . Hypertension 08/27/2018  . HCAP (healthcare-associated pneumonia) 08/27/2018  . Acute chest pain   . Acute chest syndrome (HCC) 05/16/2018  . Leukocytosis   . Transaminitis   . 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 Family circumstance 07/03/2016  . Hypoxia   . Sickle cell crisis (HCC) 10/29/2015  . 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 Asthma 06/03/2012  . Abnormal presence of protein in urine 06/03/2012  . Sickle cell pain crisis (HCC) 04/21/2012    Past Surgical History:  Procedure Laterality Date  .  CIRCUMCISION    . TONSILLECTOMY    . TONSILLECTOMY AND ADENOIDECTOMY          Home Medications    Prior to Admission medications   Medication Sig Start Date End Date Taking? Authorizing Provider  acetaminophen (TYLENOL) 500 MG tablet Take 1,000 mg by mouth daily as needed (severe pain).   Yes [provider]  albuterol (PROVENTIL HFA;VENTOLIN HFA) 108 (90 Base) MCG/ACT inhaler Inhale 2 puffs into the lungs every 4 (four) hours as needed for wheezing or shortness of breath. 09/11/18  Yes Massie Maroon, FNP  Cholecalciferol (VITAMIN D3) 25 MCG (1000 UT) CAPS Take 1,000 Units by mouth. 03/04/19  Yes [provider]  hydroxyurea (DROXIA) 400 MG capsule Take 4 capsules (1,600 mg total) by mouth daily. 10/05/16  Yes [name], MD  losartan (COZAAR) 50 MG tablet Take 50 mg by mouth. 03/04/19 03/03/20 Yes [provider]  Oxycodone HCl 10 MG TABS Take 10 mg by mouth every 4 (four) hours as needed (pain).   Yes [provider]  lisinopril (PRINIVIL,ZESTRIL) 10 MG tablet Take 1 tablet (10 mg total) by mouth daily. Patient not taking: Reported on 04/19/2019 10/05/16   [name], MD  oseltamivir (TAMIFLU) 75 MG capsule Take 1 capsule (75 mg total) by mouth every 12 (twelve) hours. Patient not taking: Reported on 04/19/2019 11/19/18  Eustace Moore, MD    Family History Family History  Problem Relation Age of Onset  . Sickle cell anemia Brother   . Hypertension Maternal Grandmother   . Hyperlipidemia Maternal Grandmother   . Anemia Mother     Social History Social History   Tobacco Use  . Smoking status: Never Smoker  . Smokeless tobacco: Never Used  Substance Use Topics  . Alcohol use: No  . Drug use: No     Allergies   Other and Lisinopril   Review of Systems Review of Systems  Constitutional: Negative for chills, diaphoresis, fatigue and fever.  HENT: Negative for congestion, rhinorrhea and sneezing.   Eyes: Negative.    Respiratory: Negative for cough, chest tightness and shortness of breath.   Cardiovascular: Negative for chest pain and leg swelling.  Gastrointestinal: Negative for abdominal pain, blood in stool, diarrhea, nausea and vomiting.  Genitourinary: Negative for difficulty urinating, flank pain, frequency and hematuria.  Musculoskeletal: Positive for back pain. Negative for arthralgias.  Skin: Negative for rash.  Neurological: Negative for dizziness, speech difficulty, weakness, numbness and headaches.     Physical Exam Updated Vital Signs BP (!) 110/44 (BP Location: Right Arm)   Pulse 65   Temp 98 F (36.7 C) (Oral)   Resp 15   Ht 5\' 7"  (1.702 m)   Wt 99.8 kg   SpO2 94%   BMI 34.46 kg/m   Physical Exam Constitutional:      Appearance: He is well-developed.  HENT:     Head: Normocephalic and atraumatic.  Eyes:     Pupils: Pupils are equal, round, and reactive to light.  Neck:     Musculoskeletal: Normal range of motion and neck supple.  Cardiovascular:     Rate and Rhythm: Normal rate and regular rhythm.     Heart sounds: Normal heart sounds.  Pulmonary:     Effort: Pulmonary effort is normal. No respiratory distress.     Breath sounds: Normal breath sounds. No wheezing or rales.  Chest:     Chest wall: No tenderness.  Abdominal:     General: Bowel sounds are normal.     Palpations: Abdomen is soft.     Tenderness: There is no abdominal tenderness. There is no guarding or rebound.  Musculoskeletal: Normal range of motion.     Comments: No joint swelling  Lymphadenopathy:     Cervical: No cervical adenopathy.  Skin:    General: Skin is warm and dry.     Findings: No rash.  Neurological:     Mental Status: He is alert and oriented to person, place, and time.      ED Treatments / Results  Labs (all labs ordered are listed, but only abnormal results are displayed) Labs Reviewed  COMPREHENSIVE METABOLIC PANEL - Abnormal; Notable for the following components:       Result Value   Total Bilirubin 2.4 (*)    All other components within normal limits  CBC WITH DIFFERENTIAL/PLATELET - Abnormal; Notable for the following components:   WBC 12.2 (*)    RBC 2.84 (*)    Hemoglobin 9.8 (*)    HCT 27.3 (*)    MCH 34.5 (*)    RDW 16.8 (*)    Platelets 443 (*)    nRBC 0.7 (*)    Lymphs Abs 6.1 (*)    Monocytes Absolute 1.4 (*)    All other components within normal limits  RETICULOCYTES - Abnormal; Notable for the following components:  Retic Ct Pct 6.7 (*)    RBC. 2.84 (*)    Retic Count, Absolute 190.6 (*)    Immature Retic Fract 33.5 (*)    All other components within normal limits    EKG None  Radiology No results found.  Procedures Procedures (including critical care time)  Medications Ordered in ED Medications  dextrose 5 %-0.45 % sodium chloride infusion ( Intravenous Stopped 04/19/19 1949)  HYDROmorphone (DILAUDID) injection 1 mg (1 mg Intravenous Not Given 04/19/19 1942)    Or  HYDROmorphone (DILAUDID) injection 1 mg ( Subcutaneous See Alternative 04/19/19 1942)  HYDROmorphone (DILAUDID) injection 1 mg (1 mg Intravenous Not Given 04/19/19 2045)    Or  HYDROmorphone (DILAUDID) injection 1 mg ( Subcutaneous See Alternative 04/19/19 2045)  ketorolac (TORADOL) 30 MG/ML injection 30 mg (30 mg Intravenous Given 04/19/19 1800)  HYDROmorphone (DILAUDID) injection 0.5 mg (0.5 mg Intravenous Given 04/19/19 1800)    Or  HYDROmorphone (DILAUDID) injection 0.5 mg ( Subcutaneous See Alternative 04/19/19 1800)  HYDROmorphone (DILAUDID) injection 1 mg (1 mg Intravenous Given 04/19/19 1943)    Or  HYDROmorphone (DILAUDID) injection 1 mg ( Subcutaneous See Alternative 04/19/19 1943)     Initial Impression / Assessment and Plan / ED Course  I have reviewed the triage vital signs and the nursing notes.  Pertinent labs & imaging results that were available during my care of the patient were reviewed by me and considered in my medical decision making (see  chart for details).        Patient is a 18 year old male who presents with back pain consistent with his sickle cell pain.  His labs show an elevated white count but he does not have any suggestions of infection.  He is afebrile without other symptoms.  There is no shortness of breath or hypoxia.  No concern for acute chest syndrome.  His pain is been well controlled in the emergency department and he is ready to go home.  He was discharged home in good condition with instructions to follow-up with his caregiver.  Return precautions were given.  Final Clinical Impressions(s) / ED Diagnoses   Final diagnoses:  Sickle cell anemia with pain [facility](HCC)    ED Discharge Orders    None       Rolan BuccoBelfi, Gage Weant, MD 04/19/19 2114

## 2019-04-21 ENCOUNTER — Other Ambulatory Visit: Payer: Self-pay

## 2019-04-21 ENCOUNTER — Ambulatory Visit (INDEPENDENT_AMBULATORY_CARE_PROVIDER_SITE_OTHER): Payer: Medicaid Other | Admitting: Family Medicine

## 2019-04-21 ENCOUNTER — Encounter: Payer: Self-pay | Admitting: Family Medicine

## 2019-04-21 VITALS — BP 116/60 | HR 86 | Temp 98.0°F | Ht 67.0 in | Wt 224.0 lb

## 2019-04-21 DIAGNOSIS — G894 Chronic pain syndrome: Secondary | ICD-10-CM

## 2019-04-21 DIAGNOSIS — D571 Sickle-cell disease without crisis: Secondary | ICD-10-CM

## 2019-04-21 DIAGNOSIS — F1190 Opioid use, unspecified, uncomplicated: Secondary | ICD-10-CM

## 2019-04-21 DIAGNOSIS — Z09 Encounter for follow-up examination after completed treatment for conditions other than malignant neoplasm: Secondary | ICD-10-CM | POA: Diagnosis not present

## 2019-04-21 LAB — POCT URINALYSIS DIP (MANUAL ENTRY)
Bilirubin, UA: NEGATIVE
Blood, UA: NEGATIVE
Glucose, UA: NEGATIVE mg/dL
Ketones, POC UA: NEGATIVE mg/dL
Nitrite, UA: NEGATIVE
Protein Ur, POC: 30 mg/dL — AB
Spec Grav, UA: 1.015 (ref 1.010–1.025)
Urobilinogen, UA: 1 E.U./dL
pH, UA: 7 (ref 5.0–8.0)

## 2019-04-21 NOTE — Progress Notes (Signed)
[facility]   Hospital Follow Up--Sickle Cell Anemia   Subjective:  Patient ID: QUATAVIUS FISH, male    DOB: 2001/02/03  Age: 18 y.o. MRN: [id number]  CC:  Chief Complaint  Patient presents with  . Hospitalization Follow-up    HPI DEMAJ BATTERSBY is a 36 male who presents for Hospital Follow Up today.   Past Medical History:  Diagnosis Date  . 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 Blurred vision   . HCAP (healthcare-associated pneumonia) 08/27/2018  . Sickle cell anemia (HCC)   . Sickle cell crisis (HCC)   . Sickle cell nephropathy (HCC)    Current Status: Since his last office visit, he is doing well with no complaints. He states that he has pain in his left flank area. He rates his pain today at 2/10. He has not had a hospital visit for Sickle Cell Crisis since 04/19/2019 where he was treated and discharged the same day. He is currently taking all medications as prescribed and staying well hydrated. He reports occasional nausea, constipation, dizziness and headaches.   He denies fevers, chills, fatigue, recent infections, weight loss, and night sweats. He has not had any visual changes, and falls. No chest pain, heart palpitations, cough and shortness of breath reported. No reports of GI problems such as diarrhea. She has no reports of blood in stools, dysuria and hematuria. No depression or anxiety reported.   Past Surgical History:  Procedure Laterality Date  . CIRCUMCISION    . TONSILLECTOMY    . TONSILLECTOMY AND ADENOIDECTOMY      Family History  Problem Relation Age of Onset  . Sickle cell anemia Brother   . Hypertension Maternal Grandmother   . Hyperlipidemia Maternal Grandmother   . Anemia Mother     Social History   Socioeconomic History  . Marital status: Single    Spouse name: Not on file  . Number of children: Not on file  . Years  of education: Not on file  . Highest education level: Not on file  Occupational History  . Not on file  Social Needs  . Financial resource strain: Not on file  . Food insecurity:    Worry: Not on file    Inability: Not on file  . Transportation needs:    Medical: Not on file    Non-medical: Not on file  Tobacco Use  . Smoking status: Never Smoker  . Smokeless tobacco: Never Used  Substance and Sexual Activity  . Alcohol use: No  . Drug use: No  . Sexual activity: Not on file  Lifestyle  . Physical activity:    Days per week: Not on file    Minutes per session: Not on file  . Stress: Not on file  Relationships  . Social connections:    Talks on phone: Not on file    Gets together: Not on file    Attends religious service: Not on file    Active member of club or organization: Not on file    Attends meetings of clubs or organizations: Not on file    Relationship status: Not on file  . Intimate partner violence:    Fear of current or ex partner: Not on file    Emotionally abused: Not on file    Physically abused: Not on file    Forced sexual activity: Not on file  Other Topics Concern  . Not on file  Social History Narrative   Lives at home with mother, 6 siblings and sometimes grandmother.    Outpatient Medications Prior to Visit  Medication Sig Dispense Refill  . acetaminophen (TYLENOL) 500 MG tablet Take 1,000 mg by mouth daily as needed (severe pain).    Marland Kitchen albuterol (PROVENTIL HFA;VENTOLIN HFA) 108 (90 Base) MCG/ACT inhaler Inhale 2 puffs into the lungs every 4 (four) hours as needed for wheezing or shortness of breath. 1 Inhaler 3  . Cholecalciferol (VITAMIN D3) 25 MCG (1000 UT) CAPS Take 1,000 Units by mouth.    . hydroxyurea (DROXIA) 400 MG capsule Take 4 capsules (1,600 mg total) by mouth daily. 40 capsule 0  . losartan (COZAAR) 50 MG tablet Take 50 mg by mouth.    . Oxycodone HCl 10 MG TABS Take 10 mg by mouth every 4 (four) hours as needed (pain).    Marland Kitchen  lisinopril (PRINIVIL,ZESTRIL) 10 MG tablet Take 1 tablet (10 mg total) by mouth daily. (Patient not taking: Reported on 04/19/2019) 30 tablet 0  . oseltamivir (TAMIFLU) 75 MG capsule Take 1 capsule (75 mg total) by mouth every 12 (twelve) hours. (Patient not taking: Reported on 04/19/2019) 10 capsule 0   No facility-administered medications prior to visit.     Allergies  Allergen Reactions  . Other Cough    Seasonal allergies.  . Lisinopril Cough    ROS Review of Systems  Constitutional: Negative.   HENT: Negative.   Eyes: Negative.   Respiratory: Negative.   Cardiovascular: Negative.   Gastrointestinal: Negative.   Endocrine: Negative.   Genitourinary: Negative.   Musculoskeletal: Positive for arthralgias (generalized joint pain).  Skin: Negative.   Allergic/Immunologic: Negative.   Neurological: Positive for dizziness and headaches.  Hematological: Negative.   Psychiatric/Behavioral: Negative.       Objective:    Physical Exam  Constitutional: He is oriented to person, place, and time. He appears well-developed and well-nourished.  HENT:  Head: Normocephalic and atraumatic.  Eyes: Conjunctivae are normal.  Neck: Normal range of motion. Neck supple.  Cardiovascular: Normal rate, regular rhythm, normal heart sounds and intact distal pulses.  Pulmonary/Chest: Effort normal and breath sounds normal.  Abdominal: Soft. Bowel sounds are normal.  Musculoskeletal: Normal range of motion.  Neurological: He is alert and oriented to person, place, and time. He has normal reflexes.  Psychiatric: He has a normal mood and affect. His behavior is normal. Judgment and thought content normal.  Nursing note and vitals reviewed.   BP 116/60 (BP Location: Right Arm, Patient Position: Sitting, Cuff Size: Large)   Pulse 86   Temp 98 F (36.7 C) (Oral)   Ht  (1.702 m)   Wt 224 lb (101.6 kg)   SpO2 94%   BMI 35.08 kg/m  Wt Readings from Last 3 Encounters:  04/21/19 224 lb  (101.6 kg) (97 %, Z= 1.95)*  04/19/19 220 lb (99.8 kg) (97 %, Z= 1.87)*  09/16/18 184 lb (83.5 kg) (86 %, Z= 1.07)*   * Growth percentiles are based on CDC (Boys, 2-20 Years) data.     There are no preventive care reminders to display for this patient.  There are no preventive care reminders to display for this patient.  No results found for: TSH Lab Results  Component Value Date   WBC 12.2 (H) 04/19/2019   HGB 9.8 (L) 04/19/2019   HCT 27.3 (L) 04/19/2019   MCV 96.1 04/19/2019   PLT 443 (H) 04/19/2019  Lab Results  Component Value Date   NA 139 04/19/2019   K 4.1 04/19/2019   CO2 24 04/19/2019   GLUCOSE 94 04/19/2019   BUN 12 04/19/2019   CREATININE 0.71 04/19/2019   BILITOT 2.4 (H) 04/19/2019   ALKPHOS 100 04/19/2019   AST 40 04/19/2019   ALT 26 04/19/2019   PROT 7.7 04/19/2019   ALBUMIN 4.5 04/19/2019   CALCIUM 9.1 04/19/2019   ANIONGAP 9 04/19/2019   No results found for: CHOL No results found for: HDL No results found for: LDLCALC No results found for: TRIG No results found for: CHOLHDL No results found for: [id number]    Assessment & Plan:   1. Hospital discharge follow-up  2. 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[facility](HCC) He is doing well today. He will continue to take pain medications as prescribed; will continue to avoid extreme heat and cold; will continue to eat a healthy diet and drink at least 64 ounces of water daily; continue stool softener as needed; will avoid colds and flu; will continue to get plenty of sleep and rest; will continue to avoid high stressful situations and remain infection free; will continue Folic Acid 1 mg daily to avoid sickle cell crisis.   3. Chronic pain syndrome  4. Chronic, continuous use of opioids  5. Follow up He will follow up in 2 months.  - POCT urinalysis dipstick  No orders of the defined types were placed in this encounter.   Orders Placed This Encounter  Procedures  . POCT urinalysis dipstick   Referral Orders   No referral(s) requested today    Raliegh IpNatalie Lashya Passe,  MSN, FNP-BC [name] [address]  [place], [place] [id number] [phone]   Problem List Items Addressed This Visit      Other   Chronic pain syndrome   Chronic, continuous use of opioids   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[facility](HCC)    Other Visit Diagnoses    Hospital discharge follow-up    -  Primary   Follow up       Relevant Orders   POCT urinalysis dipstick (Completed)      No orders of the defined types were placed in this encounter.   Follow-up: Return in about 2 months (around 06/21/2019).    Kallie LocksNatalie M Malone Admire, FNP

## 2019-06-16 DIAGNOSIS — D571 Sickle-cell disease without crisis: Secondary | ICD-10-CM | POA: Diagnosis not present

## 2019-06-26 DIAGNOSIS — K047 Periapical abscess without sinus: Secondary | ICD-10-CM

## 2019-06-26 HISTORY — DX: Periapical abscess without sinus: K04.7

## 2019-06-28 ENCOUNTER — Ambulatory Visit: Payer: Medicaid Other | Admitting: Family Medicine

## 2019-07-10 ENCOUNTER — Encounter (HOSPITAL_COMMUNITY): Payer: Self-pay | Admitting: Emergency Medicine

## 2019-07-10 ENCOUNTER — Emergency Department (HOSPITAL_COMMUNITY)
Admission: EM | Admit: 2019-07-10 | Discharge: 2019-07-10 | Disposition: A | Discharge status: Home / Self Care | Payer: Medicaid Other | Attending: Emergency Medicine | Admitting: Emergency Medicine

## 2019-07-10 ENCOUNTER — Other Ambulatory Visit: Payer: Self-pay

## 2019-07-10 DIAGNOSIS — M25511 Pain in right shoulder: Secondary | ICD-10-CM
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
## 2019-07-28 ENCOUNTER — Ambulatory Visit (INDEPENDENT_AMBULATORY_CARE_PROVIDER_SITE_OTHER): Payer: Medicaid Other | Admitting: Family Medicine

## 2019-07-28 ENCOUNTER — Other Ambulatory Visit: Payer: Self-pay

## 2019-07-28 ENCOUNTER — Encounter: Payer: Self-pay | Admitting: Family Medicine

## 2019-07-28 ENCOUNTER — Ambulatory Visit: Payer: Medicaid Other | Admitting: Family Medicine

## 2019-07-28 DIAGNOSIS — K0889 Other specified disorders of teeth and supporting structures: Secondary | ICD-10-CM

## 2019-07-28 DIAGNOSIS — Z09 Encounter for follow-up examination after completed treatment for conditions other than malignant neoplasm: Secondary | ICD-10-CM | POA: Diagnosis not present

## 2019-07-28 DIAGNOSIS — G894 Chronic pain syndrome: Secondary | ICD-10-CM

## 2019-07-28 DIAGNOSIS — K047 Periapical abscess without sinus: Secondary | ICD-10-CM

## 2019-07-28 DIAGNOSIS — D571 Sickle-cell disease without crisis: Secondary | ICD-10-CM | POA: Diagnosis not present

## 2019-07-28 DIAGNOSIS — F1190 Opioid use, unspecified, uncomplicated: Secondary | ICD-10-CM

## 2019-07-28 MED ORDER — OXYCODONE HCL 10 MG PO TABS: 10.0000 mg | ORAL_TABLET | ORAL | 0 refills | Status: DC | PRN

## 2019-07-28 MED ORDER — IBUPROFEN 800 MG PO TABS: 800.0000 mg | ORAL_TABLET | Freq: Three times a day (TID) | ORAL | 3 refills | Status: DC | PRN

## 2019-07-28 MED ORDER — AMOXICILLIN 500 MG PO TABS: 500.0000 mg | ORAL_TABLET | Freq: Two times a day (BID) | ORAL | 0 refills | Status: DC

## 2019-07-28 NOTE — Patient Instructions (Signed)
Ibuprofen tablets and capsules What is this medicine? IBUPROFEN (eye BYOO proe fen) is a non-steroidal anti-inflammatory drug (NSAID). It is used for dental pain, fever, headaches or migraines, osteoarthritis, rheumatoid arthritis, or painful monthly periods. It can also relieve minor aches and pains caused by a cold, flu, or sore throat. This medicine may be used for other purposes; ask your health care provider or pharmacist if you have questions. COMMON BRAND NAME(S): Advil, Advil Junior Strength, Advil Migraine, Genpril, Ibren, IBU, Midol, Midol Cramps and Body Aches, Motrin, Motrin IB, Motrin Junior Strength, Motrin Migraine Pain, Samson-8, Toxicology Saliva Collection What should I tell my health care provider before I take this medicine? They need to know if you have any of these conditions:  cigarette smoker  coronary artery bypass graft (CABG) surgery within the past 2 weeks  drink more than 3 alcohol-containing drinks a day  heart disease  high blood pressure  history of stomach bleeding  kidney disease  liver disease  lung or breathing disease, like asthma  an unusual or allergic reaction to ibuprofen, aspirin, other NSAIDs, other medicines, foods, dyes, or preservatives  pregnant or trying to get pregnant  breast-feeding How should I use this medicine? Take this medicine by mouth with a glass of water. Follow the directions on the prescription label. Take this medicine with food if your stomach gets upset. Try to not lie down for at least 10 minutes after you take the medicine. Take your medicine at regular intervals. Do not take your medicine more often than directed. A special MedGuide will be given to you by the pharmacist with each prescription and refill. Be sure to read this information carefully each time. Talk to your pediatrician regarding the use of this medicine in children. Special care may be needed. Overdosage: If you think you have taken too much of this  medicine contact a poison control center or emergency room at once. NOTE: This medicine is only for you. Do not share this medicine with others. What if I miss a dose? If you miss a dose, take it as soon as you can. If it is almost time for your next dose, take only that dose. Do not take double or extra doses. What may interact with this medicine? Do not take this medicine with any of the following medications:  cidofovir  ketorolac  methotrexate  pemetrexed This medicine may also interact with the following medications:  alcohol  aspirin  diuretics  lithium  other drugs for inflammation like prednisone  warfarin This list may not describe all possible interactions. Give your health care provider a list of all the medicines, herbs, non-prescription drugs, or dietary supplements you use. Also tell them if you smoke, drink alcohol, or use illegal drugs. Some items may interact with your medicine. What should I watch for while using this medicine? Tell your doctor or healthcare provider if your symptoms do not start to get better or if they get worse. This medicine may cause serious skin reactions. They can happen weeks to months after starting the medicine. Contact your healthcare provider right away if you notice fevers or flu-like symptoms with a rash. The rash may be red or purple and then turn into blisters or peeling of the skin. Or, you might notice a red rash with swelling of the face, lips or lymph nodes in your neck or under your arms. This medicine does not prevent heart attack or stroke. In fact, this medicine may increase the chance of a heart  attack or stroke. The chance may increase with longer use of this medicine and in people who have heart disease. If you take aspirin to prevent heart attack or stroke, talk with your doctor or healthcare provider. Do not take other medicines that contain aspirin, ibuprofen, or naproxen with this medicine. Side effects such as stomach  upset, nausea, or ulcers may be more likely to occur. Many medicines available without a prescription should not be taken with this medicine. This medicine can cause ulcers and bleeding in the stomach and intestines at any time during treatment. Ulcers and bleeding can happen without warning symptoms and can cause death. To reduce your risk, do not smoke cigarettes or drink alcohol while you are taking this medicine. You may get drowsy or dizzy. Do not drive, use machinery, or do anything that needs mental alertness until you know how this medicine affects you. Do not stand or sit up quickly, especially if you are an older patient. This reduces the risk of dizzy or fainting spells. This medicine can cause you to bleed more easily. Try to avoid damage to your teeth and gums when you brush or floss your teeth. This medicine may be used to treat migraines. If you take migraine medicines for 10 or more days a month, your migraines may get worse. Keep a diary of headache days and medicine use. Contact your healthcare provider if your migraine attacks occur more frequently. What side effects may I notice from receiving this medicine? Side effects that you should report to your doctor or health care professional as soon as possible:  allergic reactions like skin rash, itching or hives, swelling of the face, lips, or tongue  redness, blistering, peeling or loosening of the skin, including inside the mouth  severe stomach pain  signs and symptoms of bleeding such as bloody or black, tarry stools; red or dark-brown urine; spitting up blood or brown material that looks like coffee grounds; red spots on the skin; unusual bruising or bleeding from the eye, gums, or nose  signs and symptoms of a blood clot such as changes in vision; chest pain; severe, sudden headache; trouble speaking; sudden numbness or weakness of the face, arm, or leg  unexplained weight gain or swelling  unusually weak or tired   yellowing of eyes or skin Side effects that usually do not require medical attention (report to your doctor or health care professional if they continue or are bothersome):  bruising  diarrhea  dizziness, drowsiness  headache  nausea, vomiting This list may not describe all possible side effects. Call your doctor for medical advice about side effects. You may report side effects to FDA at 1-800-FDA-1088. Where should I keep my medicine? Keep out of the reach of children. Store at room temperature between 15 and 30 degrees C (59 and 86 degrees F). Keep container tightly closed. Throw away any unused medicine after the expiration date. NOTE: This sheet is a summary. It may not cover all possible information. If you have questions about this medicine, talk to your doctor, pharmacist, or health care provider.  2020 Elsevier/Gold Standard (2019-01-27 14:11:00) Dental Abscess  A dental abscess is an area of pus in or around a tooth. It comes from an infection. It can cause pain and other symptoms. Treatment will help with symptoms and prevent the infection from spreading. Follow these instructions at home: Medicines  Take over-the-counter and prescription medicines only as told by your dentist.  If you were prescribed an antibiotic medicine, take  it as told by your dentist. Do not stop taking it even if you start to feel better.  If you were prescribed a gel that has numbing medicine in it, use it exactly as told.  Do not drive or use heavy machinery (like a Surveyor, mininglawn mower) while taking prescription pain medicine. General instructions  Rinse out your mouth often with salt water. ? To make salt water, dissolve -1 tsp of salt in 1 cup of warm water.  Eat a soft diet while your mouth is healing.  Drink enough fluid to keep your urine pale yellow.  Do not apply heat to the outside of your mouth.  Do not use any products that contain nicotine or tobacco. These include cigarettes and  e-cigarettes. If you need help quitting, ask your doctor.  Keep all follow-up visits as told by your dentist. This is important. Prevent an abscess  Brush your teeth every morning and every night. Use fluoride toothpaste.  Floss your teeth each day.  Get dental cleanings as often as told by your dentist.  Think about getting dental sealant put on teeth that have deep holes (decay).  Drink water that has fluoride in it. ? Most tap water has fluoride. ? Check the label on bottled water to see if it has fluoride in it.  Drink water instead of sugary drinks.  Eat healthy meals and snacks.  Wear a mouth guard or face shield when you play sports. Contact a doctor if:  Your pain is worse, and medicine does not help. Get help right away if:  You have a fever or chills.  Your symptoms suddenly get worse.  You have a very bad headache.  You have problems breathing or swallowing.  You have trouble opening your mouth.  You have swelling in your neck or close to your eye. Summary  A dental abscess is an area of pus in or around a tooth. It is caused by an infection.  Treatment will help with symptoms and prevent the infection from spreading.  Take over-the-counter and prescription medicines only as told by your dentist.  To prevent an abscess, take good care of your teeth. Brush your teeth every morning and night. Use floss every day.  Get dental cleanings as often as told by your dentist. This information is not intended to replace advice given to you by your health care provider. Make sure you discuss any questions you have with your health care provider. Document Released: 03/28/2015 Document Revised: 03/03/2019 Document Reviewed: 07/14/2017 Elsevier Patient Education  2020 [employer] Amoxicillin capsules or tablets What is this medicine? AMOXICILLIN (a mox i SIL in) is a penicillin antibiotic. It is used to treat certain kinds of bacterial infections. It will not work  for colds, flu, or other viral infections. This medicine may be used for other purposes; ask your health care provider or pharmacist if you have questions. COMMON BRAND NAME(S): Amoxil, Moxilin, Sumox, Trimox What should I tell my health care provider before I take this medicine? They need to know if you have any of these conditions:  kidney disease  an unusual or allergic reaction to amoxicillin, other penicillins, cephalosporin antibiotics, other medicines, foods, dyes, or preservatives  pregnant or trying to get pregnant  breast-feeding How should I use this medicine? Take this medicine by mouth with a glass of water. Follow the directions on your prescription label. You can take it with or without food. If it upsets your stomach, take it with food. Take your medicine  at regular intervals. Do not take your medicine more often than directed. Take all of your medicine as directed even if you think you are better. Do not skip doses or stop your medicine early. Talk to your pediatrician regarding the use of this medicine in children. While this drug may be prescribed for selected conditions, precautions do apply. Overdosage: If you think you have taken too much of this medicine contact a poison control center or emergency room at once. NOTE: This medicine is only for you. Do not share this medicine with others. What if I miss a dose? If you miss a dose, take it as soon as you can. If it is almost time for your next dose, take only that dose. Do not take double or extra doses. What may interact with this medicine?  allopurinol  birth control pills  certain antibiotics like chloramphenicol, erythromycin, sulfamethoxazole, tetracycline  certain medicines that treat or prevent blood clots like warfarin This list may not describe all possible interactions. Give your health care provider a list of all the medicines, herbs, non-prescription drugs, or dietary supplements you use. Also tell them  if you smoke, drink alcohol, or use illegal drugs. Some items may interact with your medicine. What should I watch for while using this medicine? Tell your health care professional if your symptoms do not start to get better or if they get worse. Do not treat diarrhea with over the counter products. Contact your health care professional if you have diarrhea that lasts more than 2 days or if it is severe and watery. If you have diabetes, you may get a false-positive result for sugar in your urine. Check with your health care professional. Birth control may not work properly while you are taking this medicine. Talk to your health care professional about using an extra method of birth control. This medicine may cause serious skin reactions. They can happen weeks to months after starting the medicine. Contact your health care provider right away if you notice fevers or flu-like symptoms with a rash. The rash may be red or purple and then turn into blisters or peeling of the skin. Or, you might notice a red rash with swelling of the face, lips or lymph nodes in your neck or under your arms. What side effects may I notice from receiving this medicine? Side effects that you should report to your doctor or health care professional as soon as possible:  allergic reactions like skin rash, itching or hives, swelling of the face, lips, or tongue  bloody or watery diarrhea  breathing problems  feeling faint; lightheaded, falls  fever  redness, blistering, peeling or loosening of the skin, including inside the mouth  seizures  signs and symptoms of kidney injury like trouble passing urine or change in the amount of urine  signs and symptoms of liver injury like dark yellow or brown urine; general ill feeling or flu-like symptoms; light-colored stools; loss of appetite; nausea; right upper belly pain; unusually weak or tired; yellowing of the eyes or skin  unusual bleeding or bruising  unusually weak  or tired Side effects that usually do not require medical attention (report to your doctor or health care professional if they continue or are bothersome):  anxious  confusion  diarrhea  dizziness  headache  nausea, vomiting  stomach upset  trouble sleeping This list may not describe all possible side effects. Call your doctor for medical advice about side effects. You may report side effects to FDA at  1-800-FDA-1088. Where should I keep my medicine? Keep out of the reach of children. Store at room temperature between 20 and 25 degrees C (68 and 77 degrees F). Throw away any unused medicine after the expiration date. NOTE: This sheet is a summary. It may not cover all possible information. If you have questions about this medicine, talk to your doctor, pharmacist, or health care provider.  2020 [employer]/Gold Standard (2019-01-22 14:32:29)
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
## 2019-07-29 DIAGNOSIS — K0889 Other specified disorders of teeth and supporting structures: Secondary | ICD-10-CM | POA: Insufficient documentation

## 2019-07-29 DIAGNOSIS — K047 Periapical abscess without sinus: Secondary | ICD-10-CM | POA: Insufficient documentation

## 2019-08-19 ENCOUNTER — Other Ambulatory Visit: Payer: Self-pay

## 2019-08-20 NOTE — Telephone Encounter (Signed)
Previous patient has not been seen in some time.

## 2019-08-20 NOTE — Telephone Encounter (Signed)
Refill request received for hydroxyuria  Last seen 2 years ago Last seen for this problem-sickle cell disease  If patient would like a refill, patient should call the PCP at internal medicine   Refill not approved.   Please call family to let them know that the prescription was sent the the previous clinic and not their current clinic

## 2019-08-20 NOTE — Telephone Encounter (Signed)
Attempted to notify. Phone hung up after I identified the clinic calling.Marland Kitchen

## 2019-10-26 DIAGNOSIS — S0300XA Dislocation of jaw, unspecified side, initial encounter: Secondary | ICD-10-CM

## 2019-10-26 DIAGNOSIS — M79671 Pain in right foot: Secondary | ICD-10-CM

## 2019-10-26 HISTORY — DX: Dislocation of jaw, unspecified side, initial encounter: S03.00XA

## 2019-10-26 HISTORY — DX: Pain in right foot: M79.671

## 2019-10-27 ENCOUNTER — Encounter: Payer: Self-pay | Admitting: Family Medicine

## 2019-10-27 ENCOUNTER — Ambulatory Visit (INDEPENDENT_AMBULATORY_CARE_PROVIDER_SITE_OTHER): Payer: Medicaid Other | Admitting: Family Medicine

## 2019-10-27 ENCOUNTER — Other Ambulatory Visit: Payer: Self-pay

## 2019-10-27 VITALS — BP 135/51 | HR 78 | Temp 98.1°F | Ht 67.0 in | Wt 216.4 lb

## 2019-10-27 DIAGNOSIS — N390 Urinary tract infection, site not specified: Secondary | ICD-10-CM

## 2019-10-27 DIAGNOSIS — I10 Essential (primary) hypertension: Secondary | ICD-10-CM | POA: Diagnosis not present

## 2019-10-27 DIAGNOSIS — G894 Chronic pain syndrome: Secondary | ICD-10-CM

## 2019-10-27 DIAGNOSIS — F1190 Opioid use, unspecified, uncomplicated: Secondary | ICD-10-CM | POA: Diagnosis not present

## 2019-10-27 DIAGNOSIS — R8290 Unspecified abnormal findings in urine: Secondary | ICD-10-CM

## 2019-10-27 DIAGNOSIS — M79671 Pain in right foot: Secondary | ICD-10-CM

## 2019-10-27 DIAGNOSIS — D571 Sickle-cell disease without crisis: Secondary | ICD-10-CM | POA: Diagnosis not present

## 2019-10-27 DIAGNOSIS — Z09 Encounter for follow-up examination after completed treatment for conditions other than malignant neoplasm: Secondary | ICD-10-CM

## 2019-10-27 LAB — POCT URINALYSIS DIPSTICK
Bilirubin, UA: NEGATIVE
Glucose, UA: NEGATIVE
Ketones, UA: NEGATIVE
Nitrite, UA: NEGATIVE
Protein, UA: POSITIVE — AB
Spec Grav, UA: 1.02 (ref 1.010–1.025)
Urobilinogen, UA: 4 E.U./dL — AB
pH, UA: 6 (ref 5.0–8.0)

## 2019-10-27 MED ORDER — SULFAMETHOXAZOLE-TRIMETHOPRIM 800-160 MG PO TABS: 1.0000 | ORAL_TABLET | Freq: Two times a day (BID) | ORAL | 0 refills | Status: DC
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
## 2019-10-28 ENCOUNTER — Encounter: Payer: Self-pay | Admitting: Family Medicine

## 2019-10-28 DIAGNOSIS — M79671 Pain in right foot: Secondary | ICD-10-CM | POA: Insufficient documentation

## 2019-10-29 LAB — URINE CULTURE: Organism ID, Bacteria: NO GROWTH

## 2019-11-03 ENCOUNTER — Encounter: Payer: Self-pay | Admitting: Family Medicine

## 2019-11-03 ENCOUNTER — Ambulatory Visit (INDEPENDENT_AMBULATORY_CARE_PROVIDER_SITE_OTHER): Payer: Medicaid Other | Admitting: Family Medicine

## 2019-11-03 ENCOUNTER — Other Ambulatory Visit: Payer: Self-pay

## 2019-11-03 VITALS — BP 128/62 | HR 81 | Temp 98.1°F | Ht 67.0 in | Wt 216.4 lb

## 2019-11-03 DIAGNOSIS — R8290 Unspecified abnormal findings in urine: Secondary | ICD-10-CM | POA: Diagnosis not present

## 2019-11-03 DIAGNOSIS — S0300XA Dislocation of jaw, unspecified side, initial encounter: Secondary | ICD-10-CM

## 2019-11-03 DIAGNOSIS — R6884 Jaw pain: Secondary | ICD-10-CM

## 2019-11-03 DIAGNOSIS — D571 Sickle-cell disease without crisis: Secondary | ICD-10-CM | POA: Diagnosis not present

## 2019-11-03 DIAGNOSIS — Z09 Encounter for follow-up examination after completed treatment for conditions other than malignant neoplasm: Secondary | ICD-10-CM

## 2019-11-03 DIAGNOSIS — I10 Essential (primary) hypertension: Secondary | ICD-10-CM | POA: Diagnosis not present

## 2019-11-03 LAB — POCT URINALYSIS DIPSTICK
Bilirubin, UA: NEGATIVE
Glucose, UA: NEGATIVE
Ketones, UA: NEGATIVE
Nitrite, UA: NEGATIVE
Protein, UA: POSITIVE — AB
Spec Grav, UA: >=1.03 — AB (ref 1.010–1.025)
Urobilinogen, UA: 0.2 E.U./dL
pH, UA: 6.5 (ref 5.0–8.0)

## 2019-11-03 MED ORDER — CYCLOBENZAPRINE HCL 10 MG PO TABS: 10.0000 mg | ORAL_TABLET | Freq: Every day | ORAL | 3 refills | Status: DC

## 2019-11-03 MED ORDER — NAPROXEN 500 MG PO TABS: 500.0000 mg | ORAL_TABLET | Freq: Two times a day (BID) | ORAL | 3 refills | Status: DC

## 2019-11-03 NOTE — Patient Instructions (Signed)
Jaw Range of Motion Exercises Jaw range of motion exercises are exercises that help your jaw move better. Exercises that help you have good posture (postural exercises) also help relieve jaw discomfort. These are often done along with range of motion exercises. These exercises can help prevent or improve:  Difficulty opening your mouth.  Pain in your jaw while it is open or closed.  Temporomandibular joint (TMJ) pain.  Headache caused by jaw tension. Take other actions to prevent or relieve jaw pain, such as:  Avoiding things that cause or increase jaw pain. This may include: ? Chewing gum or eating hard foods. ? Clenching your jaw or teeth, grinding your teeth, or keeping tension in your jaw muscles. ? Opening your mouth wide, such as for a big yawn. ? Leaning on your jaw, such as resting your jaw in your hand while leaning on a desk.  Putting ice on your jaw. ? Put ice in a plastic bag. ? Place a towel between your skin and the bag. ? Leave the ice on for 10-15 minutes, 2-3 times a day. Only do jaw exercises that your health care provider approves of. Only move your jaw as far as it can comfortably go in each direction. Do not move your jaw into positions that cause pain. Range of motion exercises Repeat each of these exercises 8 times, 1-2 times a day, or as told by your health care provider. Exercise A: Forward protrusion 1. Push your jaw forward. Hold this position for 1-2 seconds. 2. Allow your jaw to return to its normal position and rest it there for 1-2 seconds. Exercise B: Controlled opening 1. Stand or sit in front of a mirror. Place your tongue on the roof of your mouth, just behind your top teeth. 2. Keeping your tongue on the roof of your mouth, slowly open and close your mouth. 3. While you open and close your mouth, watch your jaw in the mirror. Try to keep your jaw from moving to one side or the other. Exercise C: Right and left motion 1. Move your jaw right. Hold  this position for 1-2 seconds. Allow your jaw to return to its normal position, and rest it there for 1-2 seconds. 2. Move your jaw left. Hold this position for 1-2 seconds. Allow your jaw to return to its normal position, and rest it there for 1-2 seconds. Postural exercises Exercise A: Chin tucks 1. You can do this exercise sitting, standing, or lying down. 2. Move your head straight back, keeping your head level. You can guide the movement by placing your fingers on your chin to push your jaw back in an even motion. You should be able to feel a double chin form at the end of the motion. 3. Hold this position for 5 seconds. Repeat 10-15 times. Exercise B: Shoulder blade squeeze 1. Sit or stand. 2. Bend your elbows to about 90 degrees, which is the shape of a capital letter "L." Keep your upper arms by your body. 3. Squeeze your shoulder blades down and back, as though you were trying to touch your elbows behind you. Do not shrug your shoulders or move your head. 4. Hold this position for 5 seconds. Repeat 10-15 times. Exercise C: Chest stretch 1. Stand facing a corner. 2. Put both of your hands and your forearms on the wall, with your arms wide apart. 3. Make sure your arms are at a 90-degree angle to your body. This means that you should hold your arms straight   out from your body, level with the floor. 4. Step in toward the corner. Do not lean in. 5. Hold this position for 30 seconds. Repeat 3 times. Contact a health care provider if you have:  Jaw pain that is new or gets worse.  Clicking or popping sounds while doing the exercises. Get help right away if:  Your jaw is stuck in one place and you cannot move it.  You cannot open or close your mouth. This information is not intended to replace advice given to you by your health care provider. Make sure you discuss any questions you have with your health care provider. Document Released: 10/24/2008 Document Revised: 03/05/2019 Document  Reviewed: 10/08/2017 Elsevier Patient Education  2020 [employer] Jaw Dislocation  A jaw dislocation is when the joint that connects the jaw bones (temporomandibular joint) moves out of place (gets dislocated). This can be very painful. Your jaw must be moved back into place by your doctor (manual reduction). What are the causes? Common causes of this condition include:  A hard, direct hit or injury (trauma) to the jaw.  Opening the mouth too wide. This can be caused by: ? Eating. ? Yawning. ? Throwing up (vomiting). ? Having a dental procedure. ? Receiving medicine by mouth to make you fall asleep (general anesthetic). What increases the risk? You are more likely to get this condition if:  Your jaw has moved out of place in the past.  You play contact sports.  You have a jaw that is loose or can move beyond the normal range. What are the signs or symptoms? Symptoms of this condition may include:  Very bad pain.  Not being able to move your jaw or close your mouth all the way.  Feeling that your teeth do not line up as they normally do when you bite.  Drooling.  Trouble speaking or swallowing. How is this treated? The most common treatment for this condition is to have your doctor move your jaw back into place. Your doctor may then wrap a bandage around your head and jaw. This will help to hold your jaw in place while it heals. Your doctor may also recommend:  Medicines to reduce pain and swelling.  Medicines to relax your muscles.  A neck brace to support your jaw. Follow these instructions at home: Managing pain, stiffness, and swelling   If told, put ice on the injured area: ? Put ice in a plastic bag. ? Place a towel between your skin and the bag. ? Leave the ice on for 20 minutes, 2-3 times a day. If you have a neck brace or jaw bandage:  Wear the brace or bandage as told by your doctor. Remove it only as told by your doctor.  Keep the brace or bandage  clean.  If the brace or bandage is not waterproof: ? Do not let it get wet. ? Cover it with a watertight covering when you take a bath or shower. Eating and drinking  Follow instructions from your doctor about what you can eat and drink while your jaw is healing. You may be asked to: ? Eat soft foods. ? Eat liquid food. ? Eat food that has been cut into small pieces. Activity  Do not open your mouth wide until your doctor says that you can.  Rest your jaw as told by your doctor.  Avoid activities that are like the one that caused your injury.  When you sneeze or yawn, put a hand under your  jaw. This will keep your mouth from opening too wide. General instructions  Take over-the-counter and prescription medicines only as told by your doctor.  Do not take baths, swim, or use a hot tub until your doctor approves. Ask your doctor if you may take showers. You may only be allowed to take sponge baths.  Keep all follow-up visits as told by your doctor. This is important. Contact a doctor if:  You have pain that gets worse, and medicines do not help your pain. Get help right away if:  Your jaw moves out of place again.  You have trouble breathing. Summary  A jaw dislocation is when the joint that connects the jaw bones (temporomandibular joint) moves out of place.  Jaw dislocation can be very painful.  The most common treatment for this condition is for your jaw to be moved back into place by your doctor (manual reduction). This information is not intended to replace advice given to you by your health care provider. Make sure you discuss any questions you have with your health care provider. Document Released: 07/24/2011 Document Revised: 09/09/2018 Document Reviewed: 09/09/2018 Elsevier Patient Education  2020 [employer] Cyclobenzaprine tablets What is this medicine? CYCLOBENZAPRINE (sye kloe BEN za preen) is a muscle relaxer. It is used to treat muscle pain, spasms, and  stiffness. This medicine may be used for other purposes; ask your health care provider or pharmacist if you have questions. COMMON BRAND NAME(S): Fexmid, Flexeril What should I tell my health care provider before I take this medicine? They need to know if you have any of these conditions:  heart disease, irregular heartbeat, or previous heart attack  liver disease  thyroid problem  an unusual or allergic reaction to cyclobenzaprine, tricyclic antidepressants, lactose, other medicines, foods, dyes, or preservatives  pregnant or trying to get pregnant  breast-feeding How should I use this medicine? Take this medicine by mouth with a glass of water. Follow the directions on the prescription label. If this medicine upsets your stomach, take it with food or milk. Take your medicine at regular intervals. Do not take it more often than directed. Talk to your pediatrician regarding the use of this medicine in children. Special care may be needed. Overdosage: If you think you have taken too much of this medicine contact a poison control center or emergency room at once. NOTE: This medicine is only for you. Do not share this medicine with others. What if I miss a dose? If you miss a dose, take it as soon as you can. If it is almost time for your next dose, take only that dose. Do not take double or extra doses. What may interact with this medicine? Do not take this medicine with any of the following medications:  MAOIs like Carbex, Eldepryl, Marplan, Nardil, and Parnate  narcotic medicines for cough  safinamide This medicine may also interact with the following medications:  alcohol  bupropion  antihistamines for allergy, cough and cold  certain medicines for anxiety or sleep  certain medicines for bladder problems like oxybutynin, tolterodine  certain medicines for depression like amitriptyline, fluoxetine, sertraline  certain medicines for Parkinson's disease like benztropine,  trihexyphenidyl  certain medicines for seizures like phenobarbital, primidone  certain medicines for stomach problems like dicyclomine, hyoscyamine  certain medicines for travel sickness like scopolamine  general anesthetics like halothane, isoflurane, methoxyflurane, propofol  ipratropium  local anesthetics like lidocaine, pramoxine, tetracaine  medicines that relax muscles for surgery  narcotic medicines for pain  phenothiazines  like chlorpromazine, mesoridazine, prochlorperazine, thioridazine  verapamil This list may not describe all possible interactions. Give your health care provider a list of all the medicines, herbs, non-prescription drugs, or dietary supplements you use. Also tell them if you smoke, drink alcohol, or use illegal drugs. Some items may interact with your medicine. What should I watch for while using this medicine? Tell your doctor or health care professional if your symptoms do not start to get better or if they get worse. You may get drowsy or dizzy. Do not drive, use machinery, or do anything that needs mental alertness until you know how this medicine affects you. Do not stand or sit up quickly, especially if you are an older patient. This reduces the risk of dizzy or fainting spells. Alcohol may interfere with the effect of this medicine. Avoid alcoholic drinks. If you are taking another medicine that also causes drowsiness, you may have more side effects. Give your health care provider a list of all medicines you use. Your doctor will tell you how much medicine to take. Do not take more medicine than directed. Call emergency for help if you have problems breathing or unusual sleepiness. Your mouth may get dry. Chewing sugarless gum or sucking hard candy, and drinking plenty of water may help. Contact your doctor if the problem does not go away or is severe. What side effects may I notice from receiving this medicine? Side effects that you should report to your  doctor or health care professional as soon as possible:  allergic reactions like skin rash, itching or hives, swelling of the face, lips, or tongue  breathing problems  chest pain  fast, irregular heartbeat  hallucinations  seizures  unusually weak or tired Side effects that usually do not require medical attention (report to your doctor or health care professional if they continue or are bothersome):  headache  nausea, vomiting This list may not describe all possible side effects. Call your doctor for medical advice about side effects. You may report side effects to FDA at 1-800-FDA-1088. Where should I keep my medicine? Keep out of the reach of children. Store at room temperature between 15 and 30 degrees C (59 and 86 degrees F). Keep container tightly closed. Throw away any unused medicine after the expiration date. NOTE: This sheet is a summary. It may not cover all possible information. If you have questions about this medicine, talk to your doctor, pharmacist, or health care provider.  2020 Elsevier/Gold Standard (2018-10-14 12:49:26) Naproxen delayed-release tablets What is this medicine? NAPROXEN (na PROX en) is a non-steroidal anti-inflammatory drug (NSAID). It is used to reduce swelling and to treat pain. This medicine may be used for dental pain, headache, or painful monthly periods. It is also used for painful joint and muscular problems such as arthritis, tendinitis, bursitis, and gout. This medicine may be used for other purposes; ask your health care provider or pharmacist if you have questions. COMMON BRAND NAME(S): EC-Naprosyn What should I tell my health care provider before I take this medicine? They need to know if you have any of these conditions:  cigarette smoker  coronary artery bypass graft (CABG) surgery within the past 2 weeks  drink more than 3 alcohol-containing drinks a day  heart disease  high blood pressure  history of stomach bleeding   kidney disease  liver disease  lung or breathing disease, like asthma  an unusual or allergic reaction to naproxen, aspirin, other NSAIDs, other medicines, foods, dyes, or preservatives  pregnant  or trying to get pregnant  breast-feeding How should I use this medicine? Take this medicine by mouth with a glass of water. Follow the directions on the prescription label. Do not cut, crush or chew this medicine. Take it with food if your stomach gets upset. Try to not lie down for at least 10 minutes after you take it. Take your medicine at regular intervals. Do not take your medicine more often than directed. Long-term, continuous use may increase the risk of heart attack or stroke. A special MedGuide will be given to you by the pharmacist with each prescription and refill. Be sure to read this information carefully each time. Talk to your pediatrician regarding the use of this medicine in children. Special care may be needed. Overdosage: If you think you have taken too much of this medicine contact a poison control center or emergency room at once. NOTE: This medicine is only for you. Do not share this medicine with others. What if I miss a dose? If you miss a dose, take it as soon as you can. If it is almost time for your next dose, take only that dose. Do not take double or extra doses. What may interact with this medicine?  alcohol  antacids  aspirin  cidofovir  diuretics  lithium  medicines for stomach, or intestine problems, like acid reflux or GERD  methotrexate  other drugs for inflammation like ketorolac or prednisone  pemetrexed  probenecid  sucralfate  warfarin This list may not describe all possible interactions. Give your health care provider a list of all the medicines, herbs, non-prescription drugs, or dietary supplements you use. Also tell them if you smoke, drink alcohol, or use illegal drugs. Some items may interact with your medicine. What should I watch  for while using this medicine? Tell your doctor or healthcare provider if your pain does not get better. Talk to your doctor before taking another medicine for pain. Do not treat yourself. This medicine does not prevent heart attack or stroke. In fact, this medicine may increase the chance of a heart attack or stroke. The chance may increase with longer use of this medicine and in people who have heart disease. If you take aspirin to prevent heart attack or stroke, talk with your doctor or healthcare provider. This medicine may cause serious skin reactions. They can happen weeks to months after starting the medicine. Contact your healthcare provider right away if you notice fevers or flu-like symptoms with a rash. The rash may be red or purple and then turn into blisters or peeling of the skin. Or, you might notice a red rash with swelling of the face, lips or lymph nodes in your neck or under your arms. Do not take other medicines that contain aspirin, ibuprofen, or naproxen with this medicine. Side effects such as stomach upset, nausea, or ulcers may be more likely to occur. Many medicines available without a prescription should not be taken with this medicine. This medicine can cause ulcers and bleeding in the stomach and intestines at any time during treatment. Do not smoke cigarettes or drink alcohol. These increase irritation to your stomach and can make it more susceptible to damage from this medicine. Ulcers and bleeding can happen without warning symptoms and can cause death. You may get drowsy or dizzy. Do not drive, use machinery, or do anything that needs mental alertness until you know how this medicine affects you. Do not stand or sit up quickly, especially if you are an older  patient. This reduces the risk of dizzy or fainting spells. This medicine can cause you to bleed more easily. Try to avoid damage to your teeth and gums when you brush or floss your teeth. What side effects may I notice  from receiving this medicine? Side effects that you should report to your doctor or health care professional as soon as possible:  black or bloody stools, blood in the urine or vomit  blurred vision  chest pain  difficulty breathing or wheezing  nausea or vomiting  redness, blistering, peeling, or loosening of the skin, including inside the mouth  skin rash, hives, or itching  slurred speech or weakness on one side of the body  swelling of eyelids, throat, lips  unexplained weight gain or swelling  unusually weak or tired  yellowing of eyes or skin Side effects that usually do not require medical attention (report to your doctor or health care professional if they continue or are bothersome):  constipation  headache  heartburn This list may not describe all possible side effects. Call your doctor for medical advice about side effects. You may report side effects to FDA at 1-800-FDA-1088. Where should I keep my medicine? Keep out of the reach of children. Store at room temperature between 15 and 30 degrees C (59 and 86 degrees F). Keep container tightly closed. Throw away any unused medicine after the expiration date. NOTE: This sheet is a summary. It may not cover all possible information. If you have questions about this medicine, talk to your doctor, pharmacist, or health care provider.  2020 [employer]vier/Gold Standard (2019-02-02 07:44:23)
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
## 2019-11-04 ENCOUNTER — Encounter: Payer: Self-pay | Admitting: Family Medicine

## 2019-11-04 DIAGNOSIS — S0300XA Dislocation of jaw, unspecified side, initial encounter: Secondary | ICD-10-CM | POA: Insufficient documentation

## 2019-11-04 DIAGNOSIS — R6884 Jaw pain: Secondary | ICD-10-CM | POA: Insufficient documentation

## 2019-11-05 LAB — URINE CULTURE: Organism ID, Bacteria: NO GROWTH

## 2019-11-15 ENCOUNTER — Other Ambulatory Visit: Payer: Self-pay

## 2019-11-15 DIAGNOSIS — Z79899 Other long term (current) drug therapy: Secondary | ICD-10-CM | POA: Insufficient documentation

## 2019-11-15 DIAGNOSIS — D5700 Hb-SS disease with crisis, unspecified: Secondary | ICD-10-CM | POA: Diagnosis not present

## 2019-11-15 DIAGNOSIS — D57219 Sickle-cell/Hb-C disease with crisis, unspecified: Secondary | ICD-10-CM | POA: Insufficient documentation

## 2019-11-15 DIAGNOSIS — R079 Chest pain, unspecified: Secondary | ICD-10-CM | POA: Diagnosis present

## 2019-11-16 ENCOUNTER — Emergency Department (HOSPITAL_COMMUNITY): Payer: Medicaid Other

## 2019-11-16 ENCOUNTER — Emergency Department (HOSPITAL_COMMUNITY)
Admission: EM | Admit: 2019-11-16 | Discharge: 2019-11-16 | Disposition: A | Discharge status: Home / Self Care | Payer: Medicaid Other | Attending: Emergency Medicine | Admitting: Emergency Medicine

## 2019-11-16 ENCOUNTER — Encounter (HOSPITAL_COMMUNITY): Payer: Self-pay | Admitting: *Deleted

## 2019-11-16 ENCOUNTER — Emergency Department (HOSPITAL_COMMUNITY)
Admission: EM | Admit: 2019-11-16 | Discharge: 2019-11-16 | Discharge status: Against Medical Advice | Payer: Medicaid Other | Source: Home / Self Care

## 2019-11-16 ENCOUNTER — Encounter (HOSPITAL_COMMUNITY): Payer: Self-pay

## 2019-11-16 ENCOUNTER — Other Ambulatory Visit: Payer: Self-pay | Admitting: Family Medicine

## 2019-11-16 ENCOUNTER — Other Ambulatory Visit: Payer: Self-pay

## 2019-11-16 DIAGNOSIS — D571 Sickle-cell disease without crisis: Secondary | ICD-10-CM

## 2019-11-16 DIAGNOSIS — D5700 Hb-SS disease with crisis, unspecified: Secondary | ICD-10-CM

## 2019-11-16 DIAGNOSIS — D57219 Sickle-cell/Hb-C disease with crisis, unspecified: Secondary | ICD-10-CM | POA: Diagnosis not present

## 2019-11-16 LAB — COMPREHENSIVE METABOLIC PANEL
ALT: 24 U/L (ref 0–44)
AST: 39 U/L (ref 15–41)
Albumin: 4.4 g/dL (ref 3.5–5.0)
Alkaline Phosphatase: 73 U/L (ref 38–126)
Anion gap: 5 (ref 5–15)
BUN: 11 mg/dL (ref 6–20)
CO2: 29 mmol/L (ref 22–32)
Calcium: 9.1 mg/dL (ref 8.9–10.3)
Chloride: 103 mmol/L (ref 98–111)
Creatinine, Ser: 0.65 mg/dL (ref 0.61–1.24)
GFR calc Af Amer: >60 mL/min (ref >60)
GFR calc non Af Amer: >60 mL/min (ref >60)
Glucose, Bld: 99 mg/dL (ref 70–99)
Potassium: 4.3 mmol/L (ref 3.5–5.1)
Sodium: 137 mmol/L (ref 135–145)
Total Bilirubin: 3.4 mg/dL — ABNORMAL HIGH (ref 0.3–1.2)
Total Protein: 7.3 g/dL (ref 6.5–8.1)

## 2019-11-16 LAB — CBC WITH DIFFERENTIAL/PLATELET
Abs Immature Granulocytes: 0.26 10*3/uL — ABNORMAL HIGH (ref 0.00–0.07)
Basophils Absolute: 0.1 10*3/uL (ref 0.0–0.1)
Basophils Relative: 1 %
Eosinophils Absolute: 0.3 10*3/uL (ref 0.0–0.5)
Eosinophils Relative: 2 %
HCT: 29.3 % — ABNORMAL LOW (ref 39.0–52.0)
Hemoglobin: 10.4 g/dL — ABNORMAL LOW (ref 13.0–17.0)
Immature Granulocytes: 2 %
Lymphocytes Relative: 38 %
Lymphs Abs: 4.9 10*3/uL — ABNORMAL HIGH (ref 0.7–4.0)
MCH: 32.1 pg (ref 26.0–34.0)
MCHC: 35.5 g/dL (ref 30.0–36.0)
MCV: 90.4 fL (ref 80.0–100.0)
Monocytes Absolute: 1.4 10*3/uL — ABNORMAL HIGH (ref 0.1–1.0)
Monocytes Relative: 11 %
Neutro Abs: 5.8 10*3/uL (ref 1.7–7.7)
Neutrophils Relative %: 46 %
Platelets: 389 10*3/uL (ref 150–400)
RBC: 3.24 MIL/uL — ABNORMAL LOW (ref 4.22–5.81)
RDW: 17.6 % — ABNORMAL HIGH (ref 11.5–15.5)
WBC: 12.8 10*3/uL — ABNORMAL HIGH (ref 4.0–10.5)
nRBC: 0.6 % — ABNORMAL HIGH (ref 0.0–0.2)

## 2019-11-16 LAB — RETICULOCYTES
Immature Retic Fract: 41.4 % — ABNORMAL HIGH (ref 2.3–15.9)
RBC.: 3.24 MIL/uL — ABNORMAL LOW (ref 4.22–5.81)
Retic Count, Absolute: 288 10*3/uL — ABNORMAL HIGH (ref 19.0–186.0)
Retic Ct Pct: 8.9 % — ABNORMAL HIGH (ref 0.4–3.1)

## 2019-11-16 MED ORDER — HYDROMORPHONE HCL 1 MG/ML IJ SOLN
1.0000 mg | INTRAMUSCULAR | Status: AC
  Administered 2019-11-16: 1 mg via INTRAVENOUS
  Filled 2019-11-16: qty 1

## 2019-11-16 MED ORDER — HYDROMORPHONE HCL 1 MG/ML IJ SOLN
0.5000 mg | Freq: Once | INTRAMUSCULAR | Status: AC
  Administered 2019-11-16: 0.5 mg via SUBCUTANEOUS
  Filled 2019-11-16: qty 1

## 2019-11-16 MED ORDER — DIPHENHYDRAMINE HCL 25 MG PO CAPS
25.0000 mg | ORAL_CAPSULE | Freq: Once | ORAL | Status: AC
  Administered 2019-11-16: 25 mg via ORAL
  Filled 2019-11-16: qty 1

## 2019-11-16 MED ORDER — HYDROMORPHONE HCL 1 MG/ML IJ SOLN: 1.0000 mg | INTRAMUSCULAR | Status: AC

## 2019-11-16 MED ORDER — OXYCODONE HCL 10 MG PO TABS: 10.0000 mg | ORAL_TABLET | ORAL | 0 refills | Status: DC | PRN

## 2019-11-16 MED ORDER — SODIUM CHLORIDE 0.45 % IV SOLN: INTRAVENOUS | Status: DC

## 2019-11-16 MED ORDER — KETOROLAC TROMETHAMINE 30 MG/ML IJ SOLN
30.0000 mg | INTRAMUSCULAR | Status: AC
  Administered 2019-11-16: 30 mg via INTRAVENOUS
  Filled 2019-11-16: qty 1

## 2019-11-16 MED ORDER — SODIUM CHLORIDE 0.9% FLUSH: 3.0000 mL | Freq: Once | INTRAVENOUS | Status: DC

## 2019-11-16 NOTE — ED Notes (Signed)
Called for room replacement no response.

## 2019-11-16 NOTE — Discharge Instructions (Addendum)
Continue your home pain regimen. Follow-up with your primary care doctor. Return here for any new/acute changes.

## 2019-11-16 NOTE — ED Triage Notes (Addendum)
Pt seen last night for SCC, thought he felt well enough to go home, woke this morning in severe pain. During triage kept texting. PT HAS A CARE PLAN ON HIS PHONE FROM [facility].

## 2019-11-16 NOTE — ED Triage Notes (Signed)
Pt complains of sickle cell pain in his chest and back
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
## 2019-11-17 ENCOUNTER — Other Ambulatory Visit: Payer: Self-pay | Admitting: Family Medicine

## 2019-11-17 ENCOUNTER — Non-Acute Institutional Stay (HOSPITAL_COMMUNITY)
Admission: AD | Admit: 2019-11-17 | Discharge: 2019-11-17 | Disposition: A | Discharge status: Home / Self Care | Payer: Medicaid Other | Source: Ambulatory Visit | Attending: Internal Medicine | Admitting: Internal Medicine

## 2019-11-17 DIAGNOSIS — Z79899 Other long term (current) drug therapy: Secondary | ICD-10-CM | POA: Insufficient documentation

## 2019-11-17 DIAGNOSIS — D5700 Hb-SS disease with crisis, unspecified: Secondary | ICD-10-CM | POA: Diagnosis not present

## 2019-11-17 DIAGNOSIS — R52 Pain, unspecified: Secondary | ICD-10-CM | POA: Diagnosis present

## 2019-11-17 LAB — COMPREHENSIVE METABOLIC PANEL
ALT: 23 U/L (ref 0–44)
AST: 36 U/L (ref 15–41)
Albumin: 4.3 g/dL (ref 3.5–5.0)
Alkaline Phosphatase: 82 U/L (ref 38–126)
Anion gap: 9 (ref 5–15)
BUN: 8 mg/dL (ref 6–20)
CO2: 24 mmol/L (ref 22–32)
Calcium: 8.9 mg/dL (ref 8.9–10.3)
Chloride: 104 mmol/L (ref 98–111)
Creatinine, Ser: 0.64 mg/dL (ref 0.61–1.24)
GFR calc Af Amer: >60 mL/min (ref >60)
GFR calc non Af Amer: >60 mL/min (ref >60)
Glucose, Bld: 102 mg/dL — ABNORMAL HIGH (ref 70–99)
Potassium: 3.5 mmol/L (ref 3.5–5.1)
Sodium: 137 mmol/L (ref 135–145)
Total Bilirubin: 4.9 mg/dL — ABNORMAL HIGH (ref 0.3–1.2)
Total Protein: 7.3 g/dL (ref 6.5–8.1)

## 2019-11-17 LAB — CBC WITH DIFFERENTIAL/PLATELET
Abs Immature Granulocytes: 0.11 10*3/uL — ABNORMAL HIGH (ref 0.00–0.07)
Basophils Absolute: 0.1 10*3/uL (ref 0.0–0.1)
Basophils Relative: 1 %
Eosinophils Absolute: 0.2 10*3/uL (ref 0.0–0.5)
Eosinophils Relative: 1 %
HCT: 27.3 % — ABNORMAL LOW (ref 39.0–52.0)
Hemoglobin: 9.7 g/dL — ABNORMAL LOW (ref 13.0–17.0)
Immature Granulocytes: 1 %
Lymphocytes Relative: 20 %
Lymphs Abs: 3.6 10*3/uL (ref 0.7–4.0)
MCH: 32.1 pg (ref 26.0–34.0)
MCHC: 35.5 g/dL (ref 30.0–36.0)
MCV: 90.4 fL (ref 80.0–100.0)
Monocytes Absolute: 2 10*3/uL — ABNORMAL HIGH (ref 0.1–1.0)
Monocytes Relative: 11 %
Neutro Abs: 11.7 10*3/uL — ABNORMAL HIGH (ref 1.7–7.7)
Neutrophils Relative %: 66 %
Platelets: 367 10*3/uL (ref 150–400)
RBC: 3.02 MIL/uL — ABNORMAL LOW (ref 4.22–5.81)
RDW: 18.7 % — ABNORMAL HIGH (ref 11.5–15.5)
WBC: 17.6 10*3/uL — ABNORMAL HIGH (ref 4.0–10.5)
nRBC: 1.3 % — ABNORMAL HIGH (ref 0.0–0.2)

## 2019-11-17 MED ORDER — DIPHENHYDRAMINE HCL 25 MG PO CAPS
25.0000 mg | ORAL_CAPSULE | ORAL | Status: DC | PRN
  Administered 2019-11-17: 25 mg via ORAL
  Filled 2019-11-17: qty 1

## 2019-11-17 MED ORDER — HYDROMORPHONE HCL 1 MG/ML IJ SOLN
1.0000 mg | Freq: Once | INTRAMUSCULAR | Status: AC
  Administered 2019-11-17: 1 mg via INTRAVENOUS
  Filled 2019-11-17: qty 1

## 2019-11-17 MED ORDER — SODIUM CHLORIDE 0.9 % IV SOLN
25.0000 mg | INTRAVENOUS | Status: DC | PRN
  Filled 2019-11-17: qty 0.5

## 2019-11-17 MED ORDER — ONDANSETRON HCL 4 MG/2ML IJ SOLN: 4.0000 mg | Freq: Four times a day (QID) | INTRAMUSCULAR | Status: DC | PRN

## 2019-11-17 MED ORDER — DEXTROSE-NACL 5-0.45 % IV SOLN: INTRAVENOUS | Status: DC

## 2019-11-17 MED ORDER — HYDROMORPHONE 1 MG/ML IV SOLN
INTRAVENOUS | Status: DC
  Administered 2019-11-17: 30 mg via INTRAVENOUS
  Administered 2019-11-17: 5.5 mg via INTRAVENOUS
  Filled 2019-11-17: qty 30

## 2019-11-17 MED ORDER — ACETAMINOPHEN 500 MG PO TABS
1000.0000 mg | ORAL_TABLET | Freq: Once | ORAL | Status: AC
  Administered 2019-11-17: 09:00:00 1000 mg via ORAL
  Filled 2019-11-17: qty 2

## 2019-11-17 MED ORDER — KETOROLAC TROMETHAMINE 15 MG/ML IJ SOLN
15.0000 mg | Freq: Once | INTRAMUSCULAR | Status: AC
  Administered 2019-11-17: 15 mg via INTRAVENOUS
  Filled 2019-11-17: qty 1

## 2019-11-17 MED ORDER — SODIUM CHLORIDE 0.9% FLUSH: 9.0000 mL | INTRAVENOUS | Status: DC | PRN

## 2019-11-17 MED ORDER — NALOXONE HCL 0.4 MG/ML IJ SOLN: 0.4000 mg | INTRAMUSCULAR | Status: DC | PRN

## 2019-11-17 NOTE — Discharge Summary (Signed)
[facility] Discharge Summary   Patient ID: [name] Hill MRN: [id number] DOB/AGE: 2001-08-30 18 y.o.  Admit date: 11/17/2019 Discharge date: 11/17/2019  Primary Care Physician:  Kallie Locks, FNP  Admission Diagnoses:  Active Problems:   Sickle cell pain crisis [facility])   Discharge Medications:  Allergies as of 11/17/2019      Reactions   Other Cough   Seasonal allergies.   Lisinopril Cough      Medication List    TAKE these medications   acetaminophen 500 MG tablet Commonly known as: TYLENOL Take 1,000 mg by mouth daily as needed (severe pain).   albuterol 108 (90 Base) MCG/ACT inhaler Commonly known as: VENTOLIN HFA Inhale 2 puffs into the lungs every 4 (four) hours as needed for wheezing or shortness of breath.   cyclobenzaprine 10 MG tablet Commonly known as: FLEXERIL Take 1 tablet (10 mg total) by mouth at bedtime.   hydroxyurea 400 MG capsule Commonly known as: DROXIA Take 4 capsules (1,600 mg total) by mouth daily.   ibuprofen 800 MG tablet Commonly known as: ADVIL Take 1 tablet (800 mg total) by mouth every 8 (eight) hours as needed.   losartan 50 MG tablet Commonly known as: COZAAR Take 50 mg by mouth.   naproxen 500 MG tablet Commonly known as: Naprosyn Take 1 tablet (500 mg total) by mouth 2 (two) times daily with a meal. As needed   Oxycodone HCl 10 MG Tabs Take 1 tablet (10 mg total) by mouth every 4 (four) hours as needed (pain).   sulfamethoxazole-trimethoprim 800-160 MG tablet Commonly known as: BACTRIM DS Take 1 tablet by mouth 2 (two) times daily.   Vitamin D3 25 MCG (1000 UT) Caps Take 1,000 Units by mouth.        Consults:  None  Significant Diagnostic Studies:  DG Chest 2 View  Result Date: 11/16/2019 CLINICAL DATA:  Sickle cell pain crisis EXAM: CHEST - 2 VIEW COMPARISON:  Four days ago FINDINGS: Stable enlarged heart size. There is no edema, consolidation, effusion, or pneumothorax. Chronic thoracic  endplate deformities correlating with the history. IMPRESSION: 1. No evidence of active disease. 2. Chronic cardiomegaly Electronically Signed   By: Marnee Spring M.D.   On: 11/16/2019 04:30   History of present illness: [name] Hill, a 18 year old male with a medical history significant for sickle cell disease presents complaining of generalized pain that is consistent with his typical pain crisis.  Went to the emergency department on 11/16/2019 for evaluation, and left prior to being seen.  Patient states that pain intensity increased several days ago and has been uncontrolled by home medications.  He attributes current pain crisis to changes in weather and increased work stressors.  Current pain intensity is 9/10 primarily to mid chest, low back, and lower extremities.  Patient last had oxycodone this a.m. with moderate relief.  Patient characterizes pain as constant and aching.  He denies headache, shortness of breath, persistent cough, sore throat, dizziness, urinary symptoms, nausea, vomiting, or diarrhea.  No sick contacts, recent travel, or exposure to COVID-19.   [facility] Course: Patient admitted to sickle cell day infusion center for management of pain crisis. All laboratory values reviewed.  WBCs increased from previous is 17.7.  Patient afebrile, no signs of infection or inflammation.  Suspected to be reactive.  No antibiotics warranted during admission. Hemoglobin 9.7, consistent with patient's baseline.  No clinical indication for blood transfusion. Pain managed with IV Dilaudid via PCA.  Settings  of 0.5 mg, 10-minute lockout, and 3 mg/h. Toradol 15 mg IV x1 dose Tylenol 1000 mg by mouth x1 dose IV fluids, D5 0.45% saline at 125 mL/h Pain intensity decreased to 3/10.  Patient does not warrant admission at this time. Discussed the importance of following medication regimen consistently in order to achieve good outcomes.  Patient expressed understanding. He is alert,  oriented, and ambulating without assistance. Patient will discharge home in a hemodynamically stable condition.  Discharge instructions: Resume all home medications.  Follow up with PCP as previously  scheduled.   Discussed the importance of drinking 64 ounces of water daily to  help prevent pain crises, it is important to drink plenty of water throughout the day. This is because dehydration of red blood cells may lead further sickling.   Avoid all stressors that precipitate sickle cell pain crisis.     The patient was given clear instructions to go to ER or return to medical center if symptoms do not improve, worsen or new problems develop.     Physical Exam at Discharge:  BP 128/66 (BP Location: Left Arm)   Pulse 78   Temp 98.2 F (36.8 C) (Oral)   Resp 20   SpO2 93%   Physical Exam Constitutional:      Appearance: Normal appearance.  HENT:     Mouth/Throat:     Mouth: Mucous membranes are moist.  Eyes:     Pupils: Pupils are equal, round, and reactive to light.  Cardiovascular:     Rate and Rhythm: Normal rate and regular rhythm.  Pulmonary:     Effort: Pulmonary effort is normal.     Breath sounds: Normal breath sounds.  Abdominal:     General: Bowel sounds are normal.     Palpations: Abdomen is soft.  Skin:    General: Skin is warm.  Neurological:     General: No focal deficit present.     Mental Status: He is alert. Mental status is at baseline.  Psychiatric:        Mood and Affect: Mood normal.        Behavior: Behavior normal.        Thought Content: Thought content normal.        Judgment: Judgment normal.       Disposition at Discharge: Discharge disposition: 01-Home or Self Care       Discharge Orders: Discharge Instructions    Discharge patient   Complete by: As directed    Discharge disposition: 01-Home or Self Care   Discharge patient date: 11/17/2019      Condition at Discharge:   Stable  Time spent on Discharge:  Greater than  30 minutes.  Signed:   Donia Pounds  APRN, MSN, FNP-C [facility] [address] [place], [place] [address] [phone]  11/17/2019, 3:53 PM

## 2019-11-17 NOTE — Progress Notes (Signed)
Patient admitted to the day infusion hospital for sickle cell pain crisis. Initially, patient reported back and chest pain rated 7/10. For pain management, patient placed on Dilaudid PCA, given 15 mg Toradol, 1000 mg Tylenol and hydrated with IV fluids. At discharge, patient rated pain at 3/10. Vital signs stable. Discharge instructions given. Patient alert, oriented and ambulatory at discharge.

## 2019-11-17 NOTE — Discharge Instructions (Signed)
Discussed medication regimen at length.  Continue oxycodone as prescribed.  Recommend that you take medication consistently over the next 24 hours.  Ibuprofen 800 mg every 8 hours as needed with food.  Also, Tylenol 1000 mg every 6 hours as needed for mild to moderate pain. Hydrate with 64 ounces of fluid as discussed.  Sickle Cell Anemia, Adult  Sickle cell anemia is a condition in which red blood cells have an abnormal "sickle" shape. Red blood cells carry oxygen through the body. Sickle-shaped red blood cells do not live as long as normal red blood cells. They also clump together and block blood from flowing through the blood vessels. This condition prevents the body from getting enough oxygen. Sickle cell anemia causes organ damage and pain. It also increases the risk of infection. What are the causes? This condition is caused by a gene that is passed from parent to child (inherited). Receiving two copies of the gene causes the disease. Receiving one copy causes the "trait," which means that symptoms are milder or not present. What increases the risk? This condition is more likely to develop if your ancestors were from [place], the [place], [place] or [place], the [place], [place], or the [place]. What are the signs or symptoms? Symptoms of this condition include:  Episodes of pain (crises), especially in the hands and feet, joints, back, chest, or abdomen. The pain can be triggered by: ? An illness, especially if there is dehydration. ? Doing an activity with great effort (overexertion). ? Exposure to extreme temperature changes. ? High altitude.  Fatigue.  Shortness of breath or difficulty breathing.  Dizziness.  Pale skin or yellowed skin (jaundice).  Frequent bacterial infections.  Pain and swelling in the hands and feet (hand-food syndrome).  Prolonged, painful erection of the penis (priapism).  Acute chest syndrome. Symptoms of this include: ? Chest  pain. ? Fever. ? Cough. ? Fast breathing.  Stroke.  Decreased activity.  Loss of appetite.  Change in behavior.  Headaches.  Seizures.  Vision changes.  Skin ulcers.  Heart disease.  High blood pressure.  Gallstones.  Liver and kidney problems. How is this diagnosed? This condition is diagnosed with blood tests that check for the gene that causes this condition. How is this treated? There is no cure for most cases of this condition. Treatment focuses on managing your symptoms and preventing complications of the disease. Your health care provider will work with you to identify the best treatment options for you based on an assessment of your condition. Treatment may include:  Medicines, including: ? Pain medicines. ? Antibiotic medicines for infection. ? Medicines to increase the production of a protein in red blood cells that helps carry oxygen in the body (hemoglobin).  Fluids to treat pain and swelling.  Oxygen to treat acute chest syndrome.  Blood transfusions to treat symptoms such as fatigue, stroke, and acute chest syndrome.  Massage and physical therapy for pain.  Regular tests to monitor your condition, such as blood tests, X-rays, CT scans, MRI scans, ultrasounds, and lung function tests. These should be done every 3-12 months, depending on your age.  Hematopoietic stem cell transplant. This is a procedure to replace abnormal stem cells with healthy stem cells from a donor's bone marrow. Stem cells are cells that can develop into blood cells, and bone marrow is the spongy tissue inside the bones. Follow these instructions at home: Medicines  Take over-the-counter and prescription medicines only as told by your health care provider.  If you were prescribed an antibiotic medicine, take it as told by your health care provider. Do not stop taking the antibiotic even if you start to feel better.  If you develop a fever, do not take medicines to reduce the  fever right away. This could cover up another problem. Notify your health care provider. Managing pain, stiffness, and swelling  Try these methods to help ease your pain: ? Using a heating pad. ? Taking a warm bath. ? Distracting yourself, such as by watching TV. Eating and drinking  Drink enough fluid to keep your urine clear or pale yellow. Drink more in hot weather and during exercise.  Limit or avoid drinking alcohol.  Eat a balanced and nutritious diet. Eat plenty of fruits, vegetables, whole grains, and lean protein.  Take vitamins and supplements as directed by your health care provider. Traveling  When traveling, keep these with you: ? Your medical information. ? The names of your health care providers. ? Your medicines.  If you have to travel by air, ask about precautions you should take. Activity  Get plenty of rest.  Avoid activities that will lower your oxygen levels, such as exercising vigorously. General instructions  Do not use any products that contain nicotine or tobacco, such as cigarettes and e-cigarettes. They lower blood oxygen levels. If you need help quitting, ask your health care provider.  Consider wearing a medical alert bracelet.  Avoid high altitudes.  Avoid extreme temperatures and extreme temperature changes.  Keep all follow-up visits as told by your health care provider. This is important. Contact a health care provider if:  You develop joint pain.  Your feet or hands swell or have pain.  You have fatigue. Get help right away if:  You have symptoms of infection. These include: ? Fever. ? Chills. ? Extreme tiredness. ? Irritability. ? Poor eating. ? Vomiting.  You feel dizzy or faint.  You have new abdominal pain, especially on the left side near the stomach area.  You develop priapism.  You have numbness in your arms or legs or have trouble moving them.  You have trouble talking.  You develop pain that cannot be  controlled with medicine.  You become short of breath.  You have rapid breathing.  You have a persistent cough.  You have pain in your chest.  You develop a severe headache or stiff neck.  You feel bloated without eating or after eating a small amount of food.  Your skin is pale.  You suddenly lose vision. Summary  Sickle cell anemia is a condition in which red blood cells have an abnormal "sickle" shape. This disease can cause organ damage and chronic pain, and it can raise your risk of infection.  Sickle cell anemia is a genetic disorder.  Treatment focuses on managing your symptoms and preventing complications of the disease.  Get medical help right away if you have any signs of infection, such as a fever. This information is not intended to replace advice given to you by your health care provider. Make sure you discuss any questions you have with your health care provider. Document Released: 02/19/2006 Document Revised: 03/05/2019 Document Reviewed: 12/17/2016 Elsevier Patient Education  2020 [employer].
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
## 2019-11-19 ENCOUNTER — Inpatient Hospital Stay (HOSPITAL_COMMUNITY)
Admission: EM | Admit: 2019-11-19 | Discharge: 2019-11-24 | DRG: 193 | Disposition: A | Discharge status: Home / Self Care | Payer: Medicaid Other | Attending: Internal Medicine | Admitting: Internal Medicine

## 2019-11-19 DIAGNOSIS — R0902 Hypoxemia: Secondary | ICD-10-CM

## 2019-11-19 DIAGNOSIS — Z888 Allergy status to other drugs, medicaments and biological substances status: Secondary | ICD-10-CM

## 2019-11-19 DIAGNOSIS — Y95 Nosocomial condition: Secondary | ICD-10-CM

## 2019-11-19 DIAGNOSIS — J9601 Acute respiratory failure with hypoxia: Secondary | ICD-10-CM | POA: Diagnosis present

## 2019-11-19 DIAGNOSIS — Z79891 Long term (current) use of opiate analgesic: Secondary | ICD-10-CM

## 2019-11-19 DIAGNOSIS — D5701 Hb-SS disease with acute chest syndrome: Secondary | ICD-10-CM | POA: Diagnosis present

## 2019-11-19 DIAGNOSIS — Z209 Contact with and (suspected) exposure to unspecified communicable disease: Secondary | ICD-10-CM | POA: Diagnosis not present

## 2019-11-19 DIAGNOSIS — Z91048 Other nonmedicinal substance allergy status: Secondary | ICD-10-CM

## 2019-11-19 DIAGNOSIS — R079 Chest pain, unspecified: Secondary | ICD-10-CM | POA: Diagnosis not present

## 2019-11-19 DIAGNOSIS — D638 Anemia in other chronic diseases classified elsewhere: Secondary | ICD-10-CM | POA: Diagnosis present

## 2019-11-19 DIAGNOSIS — Z79899 Other long term (current) drug therapy: Secondary | ICD-10-CM

## 2019-11-19 DIAGNOSIS — Z20828 Contact with and (suspected) exposure to other viral communicable diseases: Secondary | ICD-10-CM | POA: Diagnosis present

## 2019-11-19 DIAGNOSIS — R0602 Shortness of breath: Secondary | ICD-10-CM | POA: Diagnosis not present

## 2019-11-19 DIAGNOSIS — Z832 Family history of diseases of the blood and blood-forming organs and certain disorders involving the immune mechanism: Secondary | ICD-10-CM

## 2019-11-19 DIAGNOSIS — J189 Pneumonia, unspecified organism: Principal | ICD-10-CM | POA: Diagnosis present

## 2019-11-19 DIAGNOSIS — G894 Chronic pain syndrome: Secondary | ICD-10-CM | POA: Diagnosis present

## 2019-11-19 DIAGNOSIS — R0789 Other chest pain: Secondary | ICD-10-CM | POA: Diagnosis not present

## 2019-11-19 DIAGNOSIS — F1120 Opioid dependence, uncomplicated: Secondary | ICD-10-CM | POA: Diagnosis present

## 2019-11-19 NOTE — ED Triage Notes (Signed)
Pt coming from home after developing generalized weakness with fever and N/V for 3 days. Hx of sickle cell trait. 65% RA upon EMS arrival, placed on nonrebreather and increased to 100%. Weaned to 3L [place] @ 97%. 141/95, 104 HR, RR 22. Received 1,000mg  Tylenol

## 2019-11-20 ENCOUNTER — Encounter (HOSPITAL_COMMUNITY): Payer: Self-pay | Admitting: Emergency Medicine

## 2019-11-20 ENCOUNTER — Other Ambulatory Visit: Payer: Self-pay

## 2019-11-20 ENCOUNTER — Emergency Department (HOSPITAL_COMMUNITY): Payer: Medicaid Other

## 2019-11-20 DIAGNOSIS — G894 Chronic pain syndrome: Secondary | ICD-10-CM | POA: Diagnosis not present

## 2019-11-20 DIAGNOSIS — Z20828 Contact with and (suspected) exposure to other viral communicable diseases: Secondary | ICD-10-CM | POA: Diagnosis present

## 2019-11-20 DIAGNOSIS — R0602 Shortness of breath: Secondary | ICD-10-CM | POA: Diagnosis not present

## 2019-11-20 DIAGNOSIS — J9601 Acute respiratory failure with hypoxia: Secondary | ICD-10-CM | POA: Diagnosis present

## 2019-11-20 DIAGNOSIS — D638 Anemia in other chronic diseases classified elsewhere: Secondary | ICD-10-CM | POA: Diagnosis present

## 2019-11-20 DIAGNOSIS — R509 Fever, unspecified: Secondary | ICD-10-CM | POA: Diagnosis not present

## 2019-11-20 DIAGNOSIS — Z79899 Other long term (current) drug therapy: Secondary | ICD-10-CM | POA: Diagnosis not present

## 2019-11-20 DIAGNOSIS — Z91048 Other nonmedicinal substance allergy status: Secondary | ICD-10-CM | POA: Diagnosis not present

## 2019-11-20 DIAGNOSIS — J189 Pneumonia, unspecified organism: Secondary | ICD-10-CM | POA: Diagnosis not present

## 2019-11-20 DIAGNOSIS — D5701 Hb-SS disease with acute chest syndrome: Secondary | ICD-10-CM | POA: Diagnosis not present

## 2019-11-20 DIAGNOSIS — F1120 Opioid dependence, uncomplicated: Secondary | ICD-10-CM | POA: Diagnosis present

## 2019-11-20 DIAGNOSIS — Z888 Allergy status to other drugs, medicaments and biological substances status: Secondary | ICD-10-CM | POA: Diagnosis not present

## 2019-11-20 DIAGNOSIS — R0902 Hypoxemia: Secondary | ICD-10-CM | POA: Diagnosis not present

## 2019-11-20 DIAGNOSIS — Z79891 Long term (current) use of opiate analgesic: Secondary | ICD-10-CM | POA: Diagnosis not present

## 2019-11-20 DIAGNOSIS — Z832 Family history of diseases of the blood and blood-forming organs and certain disorders involving the immune mechanism: Secondary | ICD-10-CM | POA: Diagnosis not present

## 2019-11-20 LAB — COMPREHENSIVE METABOLIC PANEL
ALT: 21 U/L (ref 0–44)
AST: 49 U/L — ABNORMAL HIGH (ref 15–41)
Albumin: 3.3 g/dL — ABNORMAL LOW (ref 3.5–5.0)
Alkaline Phosphatase: 70 U/L (ref 38–126)
Anion gap: 12 (ref 5–15)
BUN: 12 mg/dL (ref 6–20)
CO2: 22 mmol/L (ref 22–32)
Calcium: 8.5 mg/dL — ABNORMAL LOW (ref 8.9–10.3)
Chloride: 101 mmol/L (ref 98–111)
Creatinine, Ser: 0.92 mg/dL (ref 0.61–1.24)
GFR calc Af Amer: >60 mL/min (ref >60)
GFR calc non Af Amer: >60 mL/min (ref >60)
Glucose, Bld: 133 mg/dL — ABNORMAL HIGH (ref 70–99)
Potassium: 4.6 mmol/L (ref 3.5–5.1)
Sodium: 135 mmol/L (ref 135–145)
Total Bilirubin: 7.5 mg/dL — ABNORMAL HIGH (ref 0.3–1.2)
Total Protein: 6.3 g/dL — ABNORMAL LOW (ref 6.5–8.1)

## 2019-11-20 LAB — CBC
HCT: 23.7 % — ABNORMAL LOW (ref 39.0–52.0)
Hemoglobin: 8.4 g/dL — ABNORMAL LOW (ref 13.0–17.0)
MCH: 30.3 pg (ref 26.0–34.0)
MCHC: 35.4 g/dL (ref 30.0–36.0)
MCV: 85.6 fL (ref 80.0–100.0)
Platelets: 307 10*3/uL (ref 150–400)
RBC: 2.77 MIL/uL — ABNORMAL LOW (ref 4.22–5.81)
RDW: 20.5 % — ABNORMAL HIGH (ref 11.5–15.5)
WBC: 21.1 10*3/uL — ABNORMAL HIGH (ref 4.0–10.5)
nRBC: 0.9 % — ABNORMAL HIGH (ref 0.0–0.2)

## 2019-11-20 LAB — CBC WITH DIFFERENTIAL/PLATELET
Abs Immature Granulocytes: 0.1 10*3/uL — ABNORMAL HIGH (ref 0.00–0.07)
Basophils Absolute: 0.1 10*3/uL (ref 0.0–0.1)
Basophils Relative: 0 %
Eosinophils Absolute: 0 10*3/uL (ref 0.0–0.5)
Eosinophils Relative: 0 %
HCT: 21.1 % — ABNORMAL LOW (ref 39.0–52.0)
Hemoglobin: 7.7 g/dL — ABNORMAL LOW (ref 13.0–17.0)
Immature Granulocytes: 1 %
Lymphocytes Relative: 15 %
Lymphs Abs: 2.8 10*3/uL (ref 0.7–4.0)
MCH: 31.3 pg (ref 26.0–34.0)
MCHC: 36.5 g/dL — ABNORMAL HIGH (ref 30.0–36.0)
MCV: 85.8 fL (ref 80.0–100.0)
Monocytes Absolute: 2.5 10*3/uL — ABNORMAL HIGH (ref 0.1–1.0)
Monocytes Relative: 13 %
Neutro Abs: 13 10*3/uL — ABNORMAL HIGH (ref 1.7–7.7)
Neutrophils Relative %: 71 %
Platelets: 275 10*3/uL (ref 150–400)
RBC: 2.46 MIL/uL — ABNORMAL LOW (ref 4.22–5.81)
RDW: 20.1 % — ABNORMAL HIGH (ref 11.5–15.5)
WBC: 18.4 10*3/uL — ABNORMAL HIGH (ref 4.0–10.5)
nRBC: 1.1 % — ABNORMAL HIGH (ref 0.0–0.2)

## 2019-11-20 LAB — URINALYSIS, ROUTINE W REFLEX MICROSCOPIC
Bacteria, UA: NONE SEEN
Bilirubin Urine: NEGATIVE
Glucose, UA: NEGATIVE mg/dL
Ketones, ur: NEGATIVE mg/dL
Leukocytes,Ua: NEGATIVE
Nitrite: NEGATIVE
Protein, ur: 100 mg/dL — AB
Specific Gravity, Urine: 1.021 (ref 1.005–1.030)
pH: 6 (ref 5.0–8.0)

## 2019-11-20 LAB — PREPARE RBC (CROSSMATCH)

## 2019-11-20 LAB — LACTIC ACID, PLASMA: Lactic Acid, Venous: 1.3 mmol/L (ref 0.5–1.9)

## 2019-11-20 LAB — D-DIMER, QUANTITATIVE: D-Dimer, Quant: 2.12 ug/mL-FEU — ABNORMAL HIGH (ref 0.00–0.50)

## 2019-11-20 LAB — RETICULOCYTES
Immature Retic Fract: 56.6 % — ABNORMAL HIGH (ref 2.3–15.9)
RBC.: 2.42 MIL/uL — ABNORMAL LOW (ref 4.22–5.81)
Retic Count, Absolute: 200.6 10*3/uL — ABNORMAL HIGH (ref 19.0–186.0)
Retic Ct Pct: 8.3 % — ABNORMAL HIGH (ref 0.4–3.1)

## 2019-11-20 LAB — SARS CORONAVIRUS 2 (TAT 6-24 HRS): SARS Coronavirus 2: NEGATIVE

## 2019-11-20 MED ORDER — SODIUM CHLORIDE 0.9% IV SOLUTION: Freq: Once | INTRAVENOUS | Status: DC

## 2019-11-20 MED ORDER — ONDANSETRON HCL 4 MG/2ML IJ SOLN
4.0000 mg | INTRAMUSCULAR | Status: DC | PRN
  Filled 2019-11-20: qty 2

## 2019-11-20 MED ORDER — POLYETHYLENE GLYCOL 3350 17 G PO PACK
17.0000 g | PACK | Freq: Every day | ORAL | Status: DC | PRN
  Administered 2019-11-23 – 2019-11-24 (×2): 17 g via ORAL
  Filled 2019-11-20 (×2): qty 1

## 2019-11-20 MED ORDER — LOSARTAN POTASSIUM 50 MG PO TABS
50.0000 mg | ORAL_TABLET | Freq: Every day | ORAL | Status: DC
  Administered 2019-11-21 – 2019-11-24 (×4): 50 mg via ORAL
  Filled 2019-11-20 (×4): qty 1

## 2019-11-20 MED ORDER — ACETAMINOPHEN 325 MG PO TABS
650.0000 mg | ORAL_TABLET | Freq: Four times a day (QID) | ORAL | Status: DC | PRN
  Administered 2019-11-20: 650 mg via ORAL
  Filled 2019-11-20: qty 2

## 2019-11-20 MED ORDER — SODIUM CHLORIDE 0.9% FLUSH: 9.0000 mL | INTRAVENOUS | Status: DC | PRN

## 2019-11-20 MED ORDER — SODIUM CHLORIDE 0.9 % IV SOLN
500.0000 mg | Freq: Once | INTRAVENOUS | Status: AC
  Administered 2019-11-20: 500 mg via INTRAVENOUS
  Filled 2019-11-20: qty 500

## 2019-11-20 MED ORDER — ALBUTEROL SULFATE (2.5 MG/3ML) 0.083% IN NEBU: 2.5000 mg | INHALATION_SOLUTION | RESPIRATORY_TRACT | Status: DC | PRN

## 2019-11-20 MED ORDER — ACETAMINOPHEN 500 MG PO TABS
1000.0000 mg | ORAL_TABLET | Freq: Once | ORAL | Status: AC
  Administered 2019-11-20: 1000 mg via ORAL
  Filled 2019-11-20: qty 2

## 2019-11-20 MED ORDER — HYDROMORPHONE 1 MG/ML IV SOLN
INTRAVENOUS | Status: DC
  Administered 2019-11-20: 2 mg via INTRAVENOUS
  Administered 2019-11-20: 30 mg via INTRAVENOUS
  Administered 2019-11-21: 1 mg via INTRAVENOUS
  Administered 2019-11-21: 0.5 mg via INTRAVENOUS
  Administered 2019-11-21: 1.5 mg via INTRAVENOUS
  Administered 2019-11-21: 2.5 mg via INTRAVENOUS
  Administered 2019-11-21: 0.5 mg via INTRAVENOUS
  Administered 2019-11-21: 2 mg via INTRAVENOUS
  Administered 2019-11-21: 3 mg via INTRAVENOUS
  Administered 2019-11-22: 30 mg via INTRAVENOUS
  Administered 2019-11-22: 2 mg via INTRAVENOUS
  Administered 2019-11-22: 0 mg via INTRAVENOUS
  Administered 2019-11-22: 2.5 mg via INTRAVENOUS
  Administered 2019-11-23: 0 mg via INTRAVENOUS
  Administered 2019-11-23: 6 mg via INTRAVENOUS
  Filled 2019-11-20 (×2): qty 30

## 2019-11-20 MED ORDER — ENOXAPARIN SODIUM 40 MG/0.4ML ~~LOC~~ SOLN
40.0000 mg | SUBCUTANEOUS | Status: DC
  Filled 2019-11-20 (×4): qty 0.4

## 2019-11-20 MED ORDER — SODIUM CHLORIDE 0.9 % IV SOLN
500.0000 mg | INTRAVENOUS | Status: DC
  Administered 2019-11-21 – 2019-11-24 (×4): 500 mg via INTRAVENOUS
  Filled 2019-11-20 (×4): qty 500

## 2019-11-20 MED ORDER — HYDROXYUREA 500 MG PO CAPS
1500.0000 mg | ORAL_CAPSULE | Freq: Every day | ORAL | Status: DC
  Administered 2019-11-21 – 2019-11-24 (×4): 1500 mg via ORAL
  Filled 2019-11-20 (×4): qty 3

## 2019-11-20 MED ORDER — ONDANSETRON HCL 4 MG/2ML IJ SOLN
4.0000 mg | Freq: Four times a day (QID) | INTRAMUSCULAR | Status: DC | PRN
  Administered 2019-11-20: 4 mg via INTRAVENOUS

## 2019-11-20 MED ORDER — MORPHINE SULFATE (PF) 4 MG/ML IV SOLN
4.0000 mg | INTRAVENOUS | Status: DC | PRN
  Administered 2019-11-20 (×2): 4 mg via INTRAVENOUS
  Filled 2019-11-20 (×2): qty 1

## 2019-11-20 MED ORDER — DIPHENHYDRAMINE HCL 25 MG PO CAPS: 25.0000 mg | ORAL_CAPSULE | ORAL | Status: DC | PRN

## 2019-11-20 MED ORDER — CHLORHEXIDINE GLUCONATE CLOTH 2 % EX PADS
6.0000 | MEDICATED_PAD | Freq: Every day | CUTANEOUS | Status: DC
  Administered 2019-11-21: 6 via TOPICAL

## 2019-11-20 MED ORDER — IOHEXOL 350 MG/ML SOLN
100.0000 mL | Freq: Once | INTRAVENOUS | Status: AC | PRN
  Administered 2019-11-20: 100 mL via INTRAVENOUS

## 2019-11-20 MED ORDER — SODIUM CHLORIDE 0.9 % IV SOLN
1.0000 g | Freq: Once | INTRAVENOUS | Status: AC
  Administered 2019-11-20: 1 g via INTRAVENOUS
  Filled 2019-11-20: qty 10

## 2019-11-20 MED ORDER — KETOROLAC TROMETHAMINE 30 MG/ML IJ SOLN
30.0000 mg | Freq: Four times a day (QID) | INTRAMUSCULAR | Status: DC
  Administered 2019-11-20 – 2019-11-24 (×18): 30 mg via INTRAVENOUS
  Filled 2019-11-20 (×19): qty 1

## 2019-11-20 MED ORDER — SENNOSIDES-DOCUSATE SODIUM 8.6-50 MG PO TABS
1.0000 | ORAL_TABLET | Freq: Two times a day (BID) | ORAL | Status: DC
  Administered 2019-11-20 – 2019-11-24 (×7): 1 via ORAL
  Filled 2019-11-20 (×8): qty 1

## 2019-11-20 MED ORDER — KETOROLAC TROMETHAMINE 30 MG/ML IJ SOLN
30.0000 mg | INTRAMUSCULAR | Status: AC
  Administered 2019-11-20: 01:00:00 30 mg via INTRAVENOUS
  Filled 2019-11-20: qty 1

## 2019-11-20 MED ORDER — SODIUM CHLORIDE 0.9 % IV SOLN
1.0000 g | INTRAVENOUS | Status: DC
  Administered 2019-11-21 – 2019-11-24 (×4): 1 g via INTRAVENOUS
  Filled 2019-11-20: qty 10
  Filled 2019-11-20 (×3): qty 1
  Filled 2019-11-20: qty 10

## 2019-11-20 MED ORDER — HYDROXYUREA 300 MG PO CAPS: 1600.0000 mg | ORAL_CAPSULE | Freq: Every day | ORAL | Status: DC

## 2019-11-20 MED ORDER — DEXTROSE-NACL 5-0.45 % IV SOLN: INTRAVENOUS | Status: DC

## 2019-11-20 MED ORDER — NALOXONE HCL 0.4 MG/ML IJ SOLN: 0.4000 mg | INTRAMUSCULAR | Status: DC | PRN

## 2019-11-20 MED ORDER — SODIUM CHLORIDE 0.9 % IV SOLN: 25.0000 mg | INTRAVENOUS | Status: DC | PRN

## 2019-11-20 NOTE — Progress Notes (Addendum)
[name] Hill, a 18 year old male with a medical history significant for sickle cell disease, chronic pain syndrome, opiate dependence and tolerance, and anemia of chronic disease was admitted with acute chest syndrome in the setting of sickle cell pain crisis.   Will transfer to [facility] med-surg unit. . Patient stable alert, oriented, and ambulating without assistance. Assessment benign.    Plan:  Add Dilaudid PCA with settings of 0.5 mg, 10 minute lockout, and 3 mg per hour.  Continue IV antibiotics Patient currently receiving blood transfusion.     Donia Pounds  APRN, MSN, FNP-C Patient [facility] [address] [place], [place] [address] [phone]
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
## 2019-11-20 NOTE — ED Notes (Signed)
Blood bank representative stated that it would be up to a day to get blood shipped that is adequate for pt. Patient aware. MD aware. Stated he would call back with an updated time frame when able
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
## 2019-11-21 ENCOUNTER — Encounter (HOSPITAL_COMMUNITY): Payer: Self-pay | Admitting: Internal Medicine

## 2019-11-21 DIAGNOSIS — J189 Pneumonia, unspecified organism: Principal | ICD-10-CM

## 2019-11-21 DIAGNOSIS — D5701 Hb-SS disease with acute chest syndrome: Secondary | ICD-10-CM

## 2019-11-21 DIAGNOSIS — J9601 Acute respiratory failure with hypoxia: Secondary | ICD-10-CM

## 2019-11-21 LAB — COMPREHENSIVE METABOLIC PANEL
ALT: 18 U/L (ref 0–44)
AST: 30 U/L (ref 15–41)
Albumin: 3 g/dL — ABNORMAL LOW (ref 3.5–5.0)
Alkaline Phosphatase: 63 U/L (ref 38–126)
Anion gap: 11 (ref 5–15)
BUN: 13 mg/dL (ref 6–20)
CO2: 24 mmol/L (ref 22–32)
Calcium: 8.1 mg/dL — ABNORMAL LOW (ref 8.9–10.3)
Chloride: 101 mmol/L (ref 98–111)
Creatinine, Ser: 0.8 mg/dL (ref 0.61–1.24)
GFR calc Af Amer: >60 mL/min (ref >60)
GFR calc non Af Amer: >60 mL/min (ref >60)
Glucose, Bld: 105 mg/dL — ABNORMAL HIGH (ref 70–99)
Potassium: 4.1 mmol/L (ref 3.5–5.1)
Sodium: 136 mmol/L (ref 135–145)
Total Bilirubin: 7.2 mg/dL — ABNORMAL HIGH (ref 0.3–1.2)
Total Protein: 6.2 g/dL — ABNORMAL LOW (ref 6.5–8.1)

## 2019-11-21 LAB — TYPE AND SCREEN
ABO/RH(D): O POS
ABO/RH(D): O POS
Antibody Screen: NEGATIVE
Antibody Screen: NEGATIVE
Unit division: 0
Unit division: 0
Unit tag comment: NEGATIVE
Unit tag comment: NEGATIVE

## 2019-11-21 LAB — BPAM RBC
Blood Product Expiration Date: 202101132359
Blood Product Expiration Date: 202101252359
ISSUE DATE / TIME: 202012261403
Unit Type and Rh: 5100
Unit Type and Rh: 5100

## 2019-11-21 LAB — CBC
HCT: 22.5 % — ABNORMAL LOW (ref 39.0–52.0)
Hemoglobin: 8.1 g/dL — ABNORMAL LOW (ref 13.0–17.0)
MCH: 30.8 pg (ref 26.0–34.0)
MCHC: 36 g/dL (ref 30.0–36.0)
MCV: 85.6 fL (ref 80.0–100.0)
Platelets: 294 10*3/uL (ref 150–400)
RBC: 2.63 MIL/uL — ABNORMAL LOW (ref 4.22–5.81)
RDW: 20.6 % — ABNORMAL HIGH (ref 11.5–15.5)
WBC: 26.4 10*3/uL — ABNORMAL HIGH (ref 4.0–10.5)
nRBC: 0.6 % — ABNORMAL HIGH (ref 0.0–0.2)

## 2019-11-21 LAB — URINE CULTURE: Culture: NO GROWTH

## 2019-11-21 MED ORDER — OXYCODONE HCL 5 MG PO TABS: 10.0000 mg | ORAL_TABLET | ORAL | Status: DC | PRN

## 2019-11-21 NOTE — Progress Notes (Signed)
Patient refusing Lovenox

## 2019-11-21 NOTE — Plan of Care (Signed)
  Problem: Education: Goal: Knowledge of vaso-occlusive preventative measures will improve Outcome: Progressing Goal: Awareness of infection prevention will improve Outcome: Progressing Goal: Awareness of signs and symptoms of anemia will improve Outcome: Progressing Goal: Long-term complications will improve Outcome: Progressing   

## 2019-11-21 NOTE — Progress Notes (Signed)
Had to reorder labs as they could not see order

## 2019-11-21 NOTE — Progress Notes (Signed)
Subjective: [name] Hill, a 18 year old male with a medical history significant for sickle cell disease type SS, chronic pain syndrome, opiate dependence and tolerance, and anemia of chronic disease was admitted for acute chest syndrome and sickle cell pain crisis.  Patient states that pain has improved overnight.  Current pain intensity is 5/10 primarily to mid chest and low back.  He characterizes pain as intermittent and aching.  Patient's hemoglobin was 7.7, which is slightly below patient's baseline.  He was transfused 1 unit of packed red blood cells.  On today, hemoglobin has improved to 8.1.  WBCs 18.4.  Patient periodically febrile.  Max temperature 101.2.  Reviewed patient's CT angiogram, which showed multifocal areas of opacity seen at the posterior bilateral lower lungs, left greater than right, which could be acute chest syndrome with superimposed multifocal pneumonia.  No pulmonary embolism visualized.  IV antibiotics initiated.  Patient denies dizziness, shortness of breath, headache, paresthesias, urinary symptoms, nausea, vomiting, or diarrhea.  Objective:  Vital signs in last 24 hours:  Vitals:   11/21/19 0700 11/21/19 0800 11/21/19 0822 11/21/19 0901  BP: 131/63 130/62    Pulse: 76 78    Resp:      Temp:    98.4 F (36.9 C)  TempSrc:    Oral  SpO2: 97% 98% 95%   Weight:      Height:        Intake/Output from previous day:   Intake/Output Summary (Last 24 hours) at 11/21/2019 0915 Last data filed at 11/21/2019 0400 Gross per 24 hour  Intake 1406.79 ml  Output 1850 ml  Net -443.21 ml    Physical Exam: General: Alert, awake, oriented x3, in no acute distress.  HEENT: [place]/AT PEERL, EOMI Neck: Trachea midline,  no masses, no thyromegal,y no JVD, no carotid bruit OROPHARYNX:  Moist, No exudate/ erythema/lesions.  Heart: Regular rate and rhythm, without murmurs, rubs, gallops, PMI non-displaced, no heaves or thrills on palpation.  Lungs: Clear to auscultation,  no wheezing or rhonchi noted. No increased vocal fremitus resonant to percussion  Abdomen: Soft, nontender, nondistended, positive bowel sounds, no masses no hepatosplenomegaly noted..  Neuro: No focal neurological deficits noted cranial nerves II through XII grossly intact. DTRs 2+ bilaterally upper and lower extremities. Strength 5 out of 5 in bilateral upper and lower extremities. Musculoskeletal: No warm swelling or erythema around joints, no spinal tenderness noted. Psychiatric: Patient alert and oriented x3, good insight and cognition, good recent to remote recall. Lymph node survey: No cervical axillary or inguinal lymphadenopathy noted.  Lab Results:  Basic Metabolic Panel:    Component Value Date/Time   NA 135 11/20/2019 0025   NA 142 06/26/2018 1416   K 4.6 11/20/2019 0025   CL 101 11/20/2019 0025   CO2 22 11/20/2019 0025   BUN 12 11/20/2019 0025   BUN 7 06/26/2018 1416   CREATININE 0.92 11/20/2019 0025   GLUCOSE 133 (H) 11/20/2019 0025   CALCIUM 8.5 (L) 11/20/2019 0025   CBC:    Component Value Date/Time   WBC 26.4 (H) 11/21/2019 0519   HGB 8.1 (L) 11/21/2019 0519   HGB 9.6 (L) 06/26/2018 1416   HCT 22.5 (L) 11/21/2019 0519   HCT 28.5 (L) 06/26/2018 1416   PLT 294 11/21/2019 0519   PLT 405 06/26/2018 1416   MCV 85.6 11/21/2019 0519   MCV 92 06/26/2018 1416   NEUTROABS 13.0 (H) 11/20/2019 0025   NEUTROABS 3.7 06/26/2018 1416   LYMPHSABS 2.8 11/20/2019 0025   LYMPHSABS 3.3 (H)  06/26/2018 1416   MONOABS 2.5 (H) 11/20/2019 0025   EOSABS 0.0 11/20/2019 0025   EOSABS 0.2 06/26/2018 1416   BASOSABS 0.1 11/20/2019 0025   BASOSABS 0.1 06/26/2018 1416    Recent Results (from the past 240 hour(s))  Blood culture (routine x 2)     Status: None (Preliminary result)   Collection Time: 11/20/19  1:50 AM   Specimen: BLOOD LEFT HAND  Result Value Ref Range Status   Specimen Description BLOOD LEFT HAND  Final   Special Requests   Final    BOTTLES DRAWN AEROBIC AND  ANAEROBIC Blood Culture adequate volume   Culture   Final    NO GROWTH 1 DAY Performed at [facility], [address] [place], [place] [address]    Report Status PENDING  Incomplete  SARS CORONAVIRUS 2 (TAT 6-24 HRS) Nasopharyngeal Nasopharyngeal Swab     Status: None   Collection Time: 11/20/19  4:14 AM   Specimen: Nasopharyngeal Swab  Result Value Ref Range Status   SARS Coronavirus 2 NEGATIVE NEGATIVE Final    Comment: (NOTE) SARS-CoV-2 target nucleic acids are NOT DETECTED. The SARS-CoV-2 RNA is generally detectable in upper and lower respiratory specimens during the acute phase of infection. Negative results do not preclude SARS-CoV-2 infection, do not rule out co-infections with other pathogens, and should not be used as the sole basis for treatment or other patient management decisions. Negative results must be combined with clinical observations, patient history, and epidemiological information. The expected result is Negative. Fact Sheet for Patients: HairSlick.no Fact Sheet for Healthcare Providers: quierodirigir.com This test is not yet approved or cleared by the [place] FDA and  has been authorized for detection and/or diagnosis of SARS-CoV-2 by FDA under an Emergency Use Authorization (EUA). This EUA will remain  in effect (meaning this test can be used) for the duration of the COVID-19 declaration under Section 56 4(b)(1) of the Act, 21 U.S.C. section 360bbb-3(b)(1), unless the authorization is terminated or revoked sooner. Performed at [facility], [address], [place], [place] [address]   Urine culture     Status: None   Collection Time: 11/20/19  5:25 AM   Specimen: Urine, Random  Result Value Ref Range Status   Specimen Description URINE, RANDOM  Final   Special Requests NONE  Final   Culture   Final    NO GROWTH Performed at [facility], [address] [place],  [place] [address]    Report Status 11/21/2019 FINAL  Final    Studies/Results: CT Angio Chest PE W and/or Wo Contrast  Result Date: 11/20/2019 CLINICAL DATA:  Shortness of breath, history of sickle cell EXAM: CT ANGIOGRAPHY CHEST WITH CONTRAST TECHNIQUE: Multidetector CT imaging of the chest was performed using the standard protocol during bolus administration of intravenous contrast. Multiplanar CT image reconstructions and MIPs were obtained to evaluate the vascular anatomy. CONTRAST:  OMNIPAQUE IOHEXOL 350 MG/ML SOLN COMPARISON:  Radiograph same day, CT chest September 09, 2018 FINDINGS: Cardiovascular: There is a optimal opacification of the pulmonary arteries. There is no central,segmental, or subsegmental filling defects within the pulmonary arteries. There is mild cardiomegaly. No evidence of right ventricular heart strain. There is normal three-vessel brachiocephalic anatomy without proximal stenosis. The thoracic aorta is normal in appearance. Mediastinum/Nodes: No hilar, mediastinal, or axillary adenopathy. Thyroid gland, trachea, and esophagus demonstrate no significant findings. Lungs/Pleura: Patchy/ground-glass opacity with focal areas of consolidation seen posterior lower lungs. There is more wedge-shaped area  of consolidation seen in the posterior right lower lung. This is left greater than right. No pleural effusion. Upper Abdomen: Again noted is atrophic spleen with small low-density lesions. Musculoskeletal: Again noted is heterogeneous appearance to the osseous structures with H-shaped vertebrae, consistent with the patient's history of sickle cell. No fracture seen. Review of the MIP images confirms the above findings. IMPRESSION: Multifocal areas opacity seen at the posterior bilateral lower lungs, left greater than right. This could be due to acute chest syndrome with superimposed multifocal pneumonia. No central, segmental, or subsegmental pulmonary embolism. Mild cardiomegaly  Electronically Signed   By: Prudencio Pair M.D.   On: 11/20/2019 03:39   DG Chest Port 1 View  Result Date: 11/20/2019 CLINICAL DATA:  Shortness of breath, fever, history of sickle cell EXAM: PORTABLE CHEST 1 VIEW COMPARISON:  Radiograph 11/16/2019 FINDINGS: Interval development of patchy airspace disease in the right infrahilar lung and retrocardiac space. No pneumothorax or visible effusion. Suspect slight increase in size of the cardiac silhouette accounting for differences in technique. No acute osseous or soft tissue abnormality. IMPRESSION: 1. Interval development of patchy airspace disease in the right infrahilar lung and retrocardiac space compatible with pneumonia or features of acute chest syndrome. 2. Likely increase in size of the cardiac silhouette. Could reflect cardiomegaly or developing pericardial effusion. Electronically Signed   By: Lovena Le M.D.   On: 11/20/2019 00:58    Medications: Scheduled Meds: . sodium chloride   Intravenous Once  . Chlorhexidine Gluconate Cloth  6 each Topical Q0600  . enoxaparin (LOVENOX) injection  40 mg Subcutaneous Q24H  . HYDROmorphone   Intravenous Q4H  . hydroxyurea  1,500 mg Oral Daily  . ketorolac  30 mg Intravenous Q6H  . losartan  50 mg Oral Daily  . senna-docusate  1 tablet Oral BID   Continuous Infusions: . azithromycin Stopped (11/21/19 0646)  . cefTRIAXone (ROCEPHIN)  IV Stopped (11/21/19 0601)  . dextrose 5 % and 0.45% NaCl 125 mL/hr at 11/20/19 0049  . 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  Oxygen saturation 100% on RA at this time. Patient status post 1 unit PRBCs, continue to follow CBC. Lactic acid negative and blood cultures negative. Patient stable does not warrant stepdown, transfer to telemetry   Sickle cell disease with pain crisis: Continue IV Dilaudid PCA with settings of 0.5 mg, 10-minute lockout, and 2 mg/h. Oxycodone 10 mg every 4 hours for severe, breakthrough pain. Monitor vital signs closely and reevaluate pain scale regularly Pain intensity will be reevaluated in context of patient's functioning in relationship to baseline as his care progresses.  Sickle cell anemia: Hemoglobin 8.1, consistent with patient's baseline.  No clinical indication for further blood transfusion at this time.  Continue to follow CBC.  Leukocytosis: WBCs 18.4.  Patient periodically febrile.  Currently afebrile.  Lactic acid negative.  Blood cultures negative.  Urine culture shows no growth.  Coronavirus.  Continue IV antibiotics.  Follow CBC, repeat in a.m.  Chronic pain syndrome:  Stable.  Continue home  medications.   Code Status: Full Code Family Communication: N/A Disposition Plan: Not yet ready for discharge  Laszlo Ellerby Rennis Petty  APRN, MSN, FNP-C [facility] [address] [place], [place] [address] [phone]  If 7PM-7AM, please contact night-coverage.  11/21/2019, 9:15 AM  LOS: 1 day

## 2019-11-22 LAB — CBC
HCT: 23.3 % — ABNORMAL LOW (ref 39.0–52.0)
Hemoglobin: 8.1 g/dL — ABNORMAL LOW (ref 13.0–17.0)
MCH: 30.3 pg (ref 26.0–34.0)
MCHC: 34.8 g/dL (ref 30.0–36.0)
MCV: 87.3 fL (ref 80.0–100.0)
Platelets: 282 10*3/uL (ref 150–400)
RBC: 2.67 MIL/uL — ABNORMAL LOW (ref 4.22–5.81)
RDW: 20.5 % — ABNORMAL HIGH (ref 11.5–15.5)
WBC: 16.9 10*3/uL — ABNORMAL HIGH (ref 4.0–10.5)
nRBC: 0.5 % — ABNORMAL HIGH (ref 0.0–0.2)

## 2019-11-22 LAB — COMPREHENSIVE METABOLIC PANEL
ALT: 17 U/L (ref 0–44)
AST: 29 U/L (ref 15–41)
Albumin: 3 g/dL — ABNORMAL LOW (ref 3.5–5.0)
Alkaline Phosphatase: 65 U/L (ref 38–126)
Anion gap: 10 (ref 5–15)
BUN: 12 mg/dL (ref 6–20)
CO2: 23 mmol/L (ref 22–32)
Calcium: 8.7 mg/dL — ABNORMAL LOW (ref 8.9–10.3)
Chloride: 104 mmol/L (ref 98–111)
Creatinine, Ser: 0.65 mg/dL (ref 0.61–1.24)
GFR calc Af Amer: >60 mL/min (ref >60)
GFR calc non Af Amer: >60 mL/min (ref >60)
Glucose, Bld: 98 mg/dL (ref 70–99)
Potassium: 4.8 mmol/L (ref 3.5–5.1)
Sodium: 137 mmol/L (ref 135–145)
Total Bilirubin: 5.3 mg/dL — ABNORMAL HIGH (ref 0.3–1.2)
Total Protein: 6.5 g/dL (ref 6.5–8.1)

## 2019-11-22 NOTE — Progress Notes (Signed)
Subjective: Patient is a 18 year old with sickle cell disease admitted with sickle cell acute chest syndrome as well as crisis.  Patient has done much better.  Was in the intensive care unit and now out to the floor.  Is still on oxygen.  Pain is only down to 4 out of 5.  No fever or chills.  Continue with antibiotics.  Objective: Vital signs in last 24 hours: Temp:  [98.1 F (36.7 C)-99.5 F (37.5 C)] 98.2 F (36.8 C) (12/28 1604) Pulse Rate:  [76-89] 89 (12/28 1604) Resp:  [17-29] 24 (12/28 1701) BP: (107-132)/(52-89) 129/89 (12/28 1604) SpO2:  [86 %-98 %] 96 % (12/28 1701) Weight change:  Last BM Date: 11/19/19  Intake/Output from previous day: 12/27 0701 - 12/28 0700 In: 295.4 [IV Piggyback:295.4] Out: 900 [Urine:900] Intake/Output this shift: Total I/O In: 1535 [P.O.:1535] Out: -   General appearance: alert, cooperative, appears stated age and no distress Head: Normocephalic, without obvious abnormality, atraumatic Neck: no adenopathy, no carotid bruit, no JVD, supple, symmetrical, trachea midline and thyroid not enlarged, symmetric, no tenderness/mass/nodules Back: symmetric, no curvature. ROM normal. No CVA tenderness. Resp: clear to auscultation bilaterally Cardio: regular rate and rhythm, S1, S2 normal, no murmur, click, rub or gallop GI: soft, non-tender; bowel sounds normal; no masses,  no organomegaly Skin: Skin color, texture, turgor normal. No rashes or lesions Neurologic: Grossly normal  Lab Results: Recent Labs    11/21/19 0519 11/22/19 0532  WBC 26.4* 16.9*  HGB 8.1* 8.1*  HCT 22.5* 23.3*  PLT 294 282   BMET Recent Labs    11/21/19 0519 11/22/19 0532  NA 136 137  K 4.1 4.8  CL 101 104  CO2 24 23  GLUCOSE 105* 98  BUN 13 12  CREATININE 0.80 0.65  CALCIUM 8.1* 8.7*    Studies/Results: No results found.  Medications: I have reviewed the patient's current medications.  Assessment/Plan: A 18 year old with history of sickle cell disease  admitted with sickle cell painful crisis.  #1 acute chest syndrome: Appears to have resolved.  Patient still on oxygen.  Titrate him off oxygen.  #2 sickle cell painful crisis, still on Dilaudid PCA.  Also on oral oxycodone every 4 hours.  Slowly titrate patient off and down to his home regimen.  #3 leukocytosis: Follow white count.  #4 chronic pain syndrome: Continue with home regimen.  #5 anemia of chronic disease, continue treatment    LOS: 2 days   Sharnae Winfree,LAWAL 11/22/2019, 6:43 PM

## 2019-11-23 LAB — COMPREHENSIVE METABOLIC PANEL
ALT: 24 U/L (ref 0–44)
AST: 39 U/L (ref 15–41)
Albumin: 2.8 g/dL — ABNORMAL LOW (ref 3.5–5.0)
Alkaline Phosphatase: 80 U/L (ref 38–126)
Anion gap: 9 (ref 5–15)
BUN: 10 mg/dL (ref 6–20)
CO2: 23 mmol/L (ref 22–32)
Calcium: 8.5 mg/dL — ABNORMAL LOW (ref 8.9–10.3)
Chloride: 104 mmol/L (ref 98–111)
Creatinine, Ser: 0.61 mg/dL (ref 0.61–1.24)
GFR calc Af Amer: >60 mL/min (ref >60)
GFR calc non Af Amer: >60 mL/min (ref >60)
Glucose, Bld: 91 mg/dL (ref 70–99)
Potassium: 4.9 mmol/L (ref 3.5–5.1)
Sodium: 136 mmol/L (ref 135–145)
Total Bilirubin: 2.3 mg/dL — ABNORMAL HIGH (ref 0.3–1.2)
Total Protein: 6.3 g/dL — ABNORMAL LOW (ref 6.5–8.1)

## 2019-11-23 LAB — CBC
HCT: 21 % — ABNORMAL LOW (ref 39.0–52.0)
Hemoglobin: 7.4 g/dL — ABNORMAL LOW (ref 13.0–17.0)
MCH: 30.1 pg (ref 26.0–34.0)
MCHC: 35.2 g/dL (ref 30.0–36.0)
MCV: 85.4 fL (ref 80.0–100.0)
Platelets: 322 10*3/uL (ref 150–400)
RBC: 2.46 MIL/uL — ABNORMAL LOW (ref 4.22–5.81)
RDW: 18.8 % — ABNORMAL HIGH (ref 11.5–15.5)
WBC: 14.5 10*3/uL — ABNORMAL HIGH (ref 4.0–10.5)
nRBC: 0.8 % — ABNORMAL HIGH (ref 0.0–0.2)

## 2019-11-23 NOTE — Progress Notes (Signed)
Patient dropped to 81 percent oxygen saturation while ambulating on room air, but quickly returned to greater than 90 percent on 2 liters at rest.

## 2019-11-23 NOTE — Progress Notes (Signed)
Subjective: Patient is doing much better.  Pain is down to 3 out of 10.  Low-grade temperature but no significant shortness of breath.  Appears to have been much improved from his acute chest.  No nausea vomiting or diarrhea.  Objective: Vital signs in last 24 hours: Temp:  [98.4 F (36.9 C)-100 F (37.8 C)] 98.4 F (36.9 C) (12/29 1225) Pulse Rate:  [75-90] 84 (12/29 1225) Resp:  [17-26] 20 (12/29 1609) BP: (124-134)/(62-80) 134/80 (12/29 1225) SpO2:  [92 %-100 %] 92 % (12/29 1609) Weight change:  Last BM Date: 11/19/19  Intake/Output from previous day: 12/28 0701 - 12/29 0700 In: 1535 [P.O.:1535] Out: -  Intake/Output this shift: No intake/output data recorded.  General appearance: alert, cooperative, appears stated age and no distress Head: Normocephalic, without obvious abnormality, atraumatic Neck: no adenopathy, no carotid bruit, no JVD, supple, symmetrical, trachea midline and thyroid not enlarged, symmetric, no tenderness/mass/nodules Back: symmetric, no curvature. ROM normal. No CVA tenderness. Resp: clear to auscultation bilaterally Cardio: regular rate and rhythm, S1, S2 normal, no murmur, click, rub or gallop GI: soft, non-tender; bowel sounds normal; no masses,  no organomegaly Skin: Skin color, texture, turgor normal. No rashes or lesions Neurologic: Grossly normal  Lab Results: Recent Labs    11/22/19 0532 11/23/19 0550  WBC 16.9* 14.5*  HGB 8.1* 7.4*  HCT 23.3* 21.0*  PLT 282 322   BMET Recent Labs    11/22/19 0532 11/23/19 0550  NA 137 136  K 4.8 4.9  CL 104 104  CO2 23 23  GLUCOSE 98 91  BUN 12 10  CREATININE 0.65 0.61  CALCIUM 8.7* 8.5*    Studies/Results: No results found.  Medications: I have reviewed the patient's current medications.  Assessment/Plan: A 18 year old with history of sickle cell disease admitted with sickle cell painful crisis.  #1 acute chest syndrome: Appears to have resolved.  Patient still on oxygen.  Titrate  him off oxygen. Mobilize patient today.   #2 sickle cell painful crisis, still on Dilaudid PCA.  Also on oral oxycodone every 4 hours.  Slowly titrate patient off and down to his home regimen.  #3 leukocytosis: Follow white count.  #4 chronic pain syndrome: Continue with home regimen.  #5 anemia of chronic disease, continue treatment    LOS: 3 days   Kyeisha Janowicz,LAWAL 11/23/2019, 8:50 PM

## 2019-11-24 DIAGNOSIS — R0902 Hypoxemia: Secondary | ICD-10-CM

## 2019-11-24 DIAGNOSIS — G894 Chronic pain syndrome: Secondary | ICD-10-CM

## 2019-11-24 LAB — COMPREHENSIVE METABOLIC PANEL
ALT: 35 U/L (ref 0–44)
AST: 39 U/L (ref 15–41)
Albumin: 3.2 g/dL — ABNORMAL LOW (ref 3.5–5.0)
Alkaline Phosphatase: 91 U/L (ref 38–126)
Anion gap: 7 (ref 5–15)
BUN: 14 mg/dL (ref 6–20)
CO2: 24 mmol/L (ref 22–32)
Calcium: 8.7 mg/dL — ABNORMAL LOW (ref 8.9–10.3)
Chloride: 107 mmol/L (ref 98–111)
Creatinine, Ser: 0.64 mg/dL (ref 0.61–1.24)
GFR calc Af Amer: >60 mL/min (ref >60)
GFR calc non Af Amer: >60 mL/min (ref >60)
Glucose, Bld: 99 mg/dL (ref 70–99)
Potassium: 5 mmol/L (ref 3.5–5.1)
Sodium: 138 mmol/L (ref 135–145)
Total Bilirubin: 3.5 mg/dL — ABNORMAL HIGH (ref 0.3–1.2)
Total Protein: 6.9 g/dL (ref 6.5–8.1)

## 2019-11-24 LAB — CBC
HCT: 21.8 % — ABNORMAL LOW (ref 39.0–52.0)
Hemoglobin: 7.4 g/dL — ABNORMAL LOW (ref 13.0–17.0)
MCH: 29.8 pg (ref 26.0–34.0)
MCHC: 33.9 g/dL (ref 30.0–36.0)
MCV: 87.9 fL (ref 80.0–100.0)
Platelets: 419 10*3/uL — ABNORMAL HIGH (ref 150–400)
RBC: 2.48 MIL/uL — ABNORMAL LOW (ref 4.22–5.81)
RDW: 19.3 % — ABNORMAL HIGH (ref 11.5–15.5)
WBC: 13.2 10*3/uL — ABNORMAL HIGH (ref 4.0–10.5)
nRBC: 0.6 % — ABNORMAL HIGH (ref 0.0–0.2)

## 2019-11-24 MED ORDER — OXYCODONE HCL 5 MG PO TABS
10.0000 mg | ORAL_TABLET | ORAL | Status: DC
  Administered 2019-11-24 (×2): 10 mg via ORAL
  Filled 2019-11-24 (×2): qty 2

## 2019-11-24 MED ORDER — HYDROMORPHONE 1 MG/ML IV SOLN
INTRAVENOUS | Status: DC
  Administered 2019-11-24: 2.5 mg via INTRAVENOUS

## 2019-11-24 NOTE — Progress Notes (Signed)
Patient walked the hallway x2. No complaints. He stated that he was a little short of breath but not enough to need a break. Patient O2 sats stayed around 92-97% during the walk.   Garlon Hatchet, LPN

## 2019-11-24 NOTE — Progress Notes (Signed)
Went over discharge instructions w/ pt and his grandmother. Pt and grandmother verbalized understanding.

## 2019-11-24 NOTE — Discharge Instructions (Signed)
Sickle Cell Anemia, Adult ° °Sickle cell anemia is a condition where your red blood cells are shaped like sickles. Red blood cells carry oxygen through the body. Sickle-shaped cells do not live as long as normal red blood cells. They also clump together and block blood from flowing through the blood vessels. This prevents the body from getting enough oxygen. Sickle cell anemia causes organ damage and pain. It also increases the risk of infection. °Follow these instructions at home: °Medicines °· Take over-the-counter and prescription medicines only as told by your doctor. °· If you were prescribed an antibiotic medicine, take it as told by your doctor. Do not stop taking the antibiotic even if you start to feel better. °· If you develop a fever, do not take medicines to lower the fever right away. Tell your doctor about the fever. °Managing pain, stiffness, and swelling °· Try these methods to help with pain: °? Use a heating pad. °? Take a warm bath. °? Distract yourself, such as by watching TV. °Eating and drinking °· Drink enough fluid to keep your pee (urine) clear or pale yellow. Drink more in hot weather and during exercise. °· Limit or avoid alcohol. °· Eat a healthy diet. Eat plenty of fruits, vegetables, whole grains, and lean protein. °· Take vitamins and supplements as told by your doctor. °Traveling °· When traveling, keep these with you: °? Your medical information. °? The names of your doctors. °? Your medicines. °· If you need to take an airplane, talk to your doctor first. °Activity °· Rest often. °· Avoid exercises that make your heart beat much faster, such as jogging. °General instructions °· Do not use products that have nicotine or tobacco, such as cigarettes and e-cigarettes. If you need help quitting, ask your doctor. °· Consider wearing a medical alert bracelet. °· Avoid being in high places (high altitudes), such as mountains. °· Avoid very hot or cold temperatures. °· Avoid places where the  temperature changes a lot. °· Keep all follow-up visits as told by your doctor. This is important. °Contact a doctor if: °· A joint hurts. °· Your feet or hands hurt or swell. °· You feel tired (fatigued). °Get help right away if: °· You have symptoms of infection. These include: °? Fever. °? Chills. °? Being very tired. °? Irritability. °? Poor eating. °? Throwing up (vomiting). °· You feel dizzy or faint. °· You have new stomach pain, especially on the left side. °· You have a an erection (priapism) that lasts more than 4 hours. °· You have numbness in your arms or legs. °· You have a hard time moving your arms or legs. °· You have trouble talking. °· You have pain that does not go away when you take medicine. °· You are short of breath. °· You are breathing fast. °· You have a long-term cough. °· You have pain in your chest. °· You have a bad headache. °· You have a stiff neck. °· Your stomach looks bloated even though you did not eat much. °· Your skin is pale. °· You suddenly cannot see well. °Summary °· Sickle cell anemia is a condition where your red blood cells are shaped like sickles. °· Follow your doctor's advice on ways to manage pain, food to eat, activities to do, and steps to take for safe travel. °· Get medical help right away if you have any signs of infection, such as a fever. °This information is not intended to replace advice given to you by   your health care provider. Make sure you discuss any questions you have with your health care provider. °Document Released: 09/01/2013 Document Revised: 03/05/2019 Document Reviewed: 12/17/2016 °Elsevier Patient Education © 2020 [employer] ° °

## 2019-11-25 LAB — CULTURE, BLOOD (ROUTINE X 2)
Culture: NO GROWTH
Special Requests: ADEQUATE

## 2019-11-25 NOTE — Discharge Summary (Signed)
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   Specialty: Family Medicine Why: CBC with differential and CMP Contact information: [address] [place] [id number] [phone]          Pt is discharged home in good condition and is to follow up with [name] LocksStroud, [name] M, FNP this week to have labs evaluated. [name] Hill is instructed to increase activity slowly and balance with rest for the next few days, and use prescribed medication to complete treatment of pain  Diet: Regular Wt Readings from Last 3 Encounters:  11/20/19 97.5 kg  11/16/19 97.5 kg  11/16/19 97.5 kg    History of present illness:  [name] Hill, a 18 year old male with a medical history significant for sickle cell disease, chronic pain syndrome, opiate dependence and tolerance, and prior history of acute chest syndrome presented to ER with complaints of a 3 day history of fever, chest and back pain, vomiting, and body aches. Patient denied know COVID exposure. Patient denies history of PE or DVT.   ER Course:  Oxygen saturation 65% with EMS, initially on NRB on arrival to ER oxygen saturation in upper 90s on 3 L. CT negative for PE but does show multifocal bilateral infiltrates confirming acute chest syndrome (infectious versus occlusive).  Rocephin and azithromycin initiated empirically in ER.  Covid test pending.  WBCs 18,000, maximum temperature 100.2, hemoglobin 7.7, which is down from 9.73 days prior.  Total bilirubin elevated at 7.5  Hospital Course:   Acute chest  syndrome with multifocal pneumonia: IV Rocephin and azithromycin were continued for a total of 5 days, no further antibiotic therapy warranted at discharge.  Ambulating pulse ox shows 93% on RA.  Patient utilizing incentive spirometer up to 1000.  Advised to continue incentive spirometer 10 times hourly while awake. Patient also advised to follow-up with PCP in 1 week. Leukocytosis: On admission, WBC is elevated at 21.1.  Prior to discharge, WBCs 13.2 after IV antibiotics x5 days.  Patient advised to follow-up with PCP for CBC with differential in 1 week.  Sickle cell disease with pain crisis: Patient was admitted for sickle cell pain crisis and managed appropriately with IVF, IV Dilaudid via PCA and IV Toradol, as well as other adjunct therapies per sickle cell pain management protocols.  IV Dilaudid PCA was weaned appropriately.  Patient transition to oxycodone 10 mg every 4 hours and will resume at discharge. Pain intensity is 2/10.  Patient is requesting discharge.  Patient is alert, oriented, and ambulating without assistance.  Oxygen saturation 90% on RA Patient was discharged home today in a hemodynamically stable condition.   Discharge Exam: Vitals:   11/24/19 1506 11/24/19 1602  BP:  (!) 142/89  Pulse: 85 (!) 103  Resp:  20  Temp:  99.6 F (37.6 C)  SpO2: 93% 90%   Vitals:   11/24/19 1217 11/24/19 1312 11/24/19 1506 11/24/19 1602  BP:    (!) 142/89  Pulse:   85 (!) 103  Resp: (!) 22   20  Temp:    99.6 F (37.6 C)  TempSrc:    Oral  SpO2: 93% 92% 93% 90%  Weight:      Height:       Physical Exam Constitutional:      Appearance: Normal appearance. He is obese.  HENT:     Mouth/Throat:     Pharynx: Oropharynx is clear.  Eyes:     General: Scleral icterus present.     Pupils: Pupils are equal, round, and reactive to light.  Cardiovascular:     Rate and Rhythm: Normal rate and regular rhythm.     Pulses: Normal pulses.  Pulmonary:     Effort: Pulmonary effort is  normal. No respiratory distress.     Breath sounds: Normal breath sounds. No wheezing.  Abdominal:     General: Bowel sounds are normal.     Palpations: Abdomen is soft.  Skin:    General: Skin is warm.  Neurological:     General: No focal deficit present.     Mental Status: He is alert. Mental status is at baseline.  Psychiatric:        Mood and Affect: Mood normal.        Behavior: Behavior normal.        Thought Content: Thought content normal.        Judgment: Judgment normal.     Discharge Instructions  Discharge Instructions    Discharge patient   Complete by: As directed    Discharge disposition: 01-Home or Self Care   Discharge patient date: 11/24/2019     Allergies as of 11/24/2019      Reactions   Other Cough   Seasonal allergies.   Lisinopril Cough      Medication List    TAKE these medications   acetaminophen 500 MG tablet Commonly known as: TYLENOL Take 1,000 mg by mouth daily as needed (severe pain).   albuterol 108 (90 Base) MCG/ACT inhaler Commonly known as: VENTOLIN HFA Inhale 2 puffs into the lungs every 4 (four) hours as needed for wheezing or shortness of breath.   hydroxyurea 400 MG capsule Commonly known as: DROXIA Take 4 capsules (1,600 mg total) by mouth daily.   losartan 50 MG tablet Commonly known as: COZAAR Take 50 mg by mouth daily.   Oxycodone HCl 10 MG Tabs Take 1 tablet (10 mg total) by mouth every 4 (four) hours as needed (pain).   Vitamin D3 25 MCG (1000 UT) Caps Take 1,000 Units by mouth daily.       The results of significant diagnostics from this hospitalization (including imaging, microbiology, ancillary and laboratory) are listed below for reference.    Significant Diagnostic Studies: DG Chest 2 View  Result Date: 11/16/2019 CLINICAL DATA:  Sickle cell pain crisis EXAM: CHEST - 2 VIEW COMPARISON:  Four days ago FINDINGS: Stable enlarged heart size. There is no edema, consolidation, effusion, or pneumothorax.  Chronic thoracic endplate deformities correlating with the history. IMPRESSION: 1. No evidence of active disease. 2. Chronic cardiomegaly Electronically Signed   By: Marnee Spring Hill.D.   On: 11/16/2019 04:30   CT Angio Chest PE W and/or Wo Contrast  Result Date: 11/20/2019 CLINICAL DATA:  Shortness of breath, history of sickle cell EXAM: CT ANGIOGRAPHY CHEST WITH CONTRAST TECHNIQUE: Multidetector CT imaging of the chest was performed using the standard protocol during bolus administration of intravenous contrast. Multiplanar CT image reconstructions and MIPs were obtained to evaluate the vascular anatomy. CONTRAST:  OMNIPAQUE IOHEXOL 350 MG/ML SOLN COMPARISON:  Radiograph same day, CT chest September 09, 2018 FINDINGS: Cardiovascular: There is a optimal opacification of the pulmonary arteries. There is no central,segmental, or subsegmental filling defects within the pulmonary arteries. There is mild cardiomegaly. No evidence of right ventricular heart strain. There is normal three-vessel brachiocephalic anatomy without proximal stenosis. The thoracic aorta is normal in appearance. Mediastinum/Nodes: No hilar, mediastinal, or axillary adenopathy. Thyroid gland, trachea, and esophagus demonstrate no significant findings. Lungs/Pleura: Patchy/ground-glass opacity with focal areas of consolidation seen posterior lower lungs. There is more wedge-shaped area of consolidation seen in the posterior right lower lung. This is left greater than right. No pleural effusion. Upper Abdomen: Again noted is atrophic spleen with small low-density lesions. Musculoskeletal: Again noted is heterogeneous appearance to the osseous structures with H-shaped vertebrae, consistent with the patient's history of sickle cell. No fracture seen. Review of the MIP images confirms the above findings. IMPRESSION: Multifocal areas opacity seen at the posterior bilateral lower lungs, left greater than right. This could be due to acute chest  syndrome with superimposed multifocal pneumonia. No central, segmental, or subsegmental pulmonary embolism. Mild cardiomegaly Electronically Signed   By: Jonna Clark Hill.D.   On: 11/20/2019 03:39   DG Chest Port 1 View  Result Date: 11/20/2019 CLINICAL DATA:  Shortness of breath, fever, history of sickle cell EXAM: PORTABLE CHEST 1 VIEW COMPARISON:  Radiograph 11/16/2019 FINDINGS: Interval development of patchy airspace disease in the right infrahilar lung and retrocardiac space. No pneumothorax or visible effusion. Suspect slight increase in size of the cardiac silhouette accounting for differences in technique. No acute osseous or soft tissue abnormality. IMPRESSION: 1. Interval development of patchy airspace disease in the right infrahilar lung and retrocardiac space compatible with pneumonia or features of acute chest syndrome. 2. Likely increase in size of the cardiac silhouette. Could reflect cardiomegaly or developing pericardial effusion. Electronically Signed   By: Kreg Shropshire Hill.D.   On: 11/20/2019 00:58    Microbiology: Recent Results (from the past 240 hour(s))  Blood culture (routine x 2)     Status: None   Collection Time: 11/20/19  1:50 AM   Specimen: BLOOD LEFT HAND  Result Value Ref Range Status   Specimen Description BLOOD LEFT HAND  Final   Special Requests   Final    BOTTLES DRAWN AEROBIC AND ANAEROBIC Blood Culture adequate volume   Culture   Final    NO GROWTH 5 DAYS Performed at [facility], [address], [place], [place] [address]    Report Status 11/25/2019 FINAL  Final  SARS CORONAVIRUS 2 (TAT 6-24 HRS) Nasopharyngeal Nasopharyngeal Swab     Status: None   Collection Time: 11/20/19  4:14 AM   Specimen: Nasopharyngeal Swab  Result Value Ref Range Status   SARS Coronavirus 2 NEGATIVE NEGATIVE Final    Comment: (NOTE) SARS-CoV-2 target nucleic acids are NOT DETECTED. The SARS-CoV-2 RNA is generally detectable in upper and lower respiratory specimens during  the acute phase of infection. Negative results do not preclude SARS-CoV-2 infection, do not rule out co-infections with other pathogens, and should not be used as the sole basis for treatment or other patient management decisions. Negative results must be combined with clinical observations, patient history, and epidemiological information. The expected result is Negative. Fact Sheet for Patients: HairSlick.no Fact Sheet for Healthcare Providers: quierodirigir.com This test is not yet approved or cleared by the [place] FDA and  has been authorized for detection and/or diagnosis of SARS-CoV-2 by FDA under an Emergency Use Authorization (EUA). This EUA will  remain  in effect (meaning this test can be used) for the duration of the COVID-19 declaration under Section 56 4(b)(1) of the Act, 21 U.S.C. section 360bbb-3(b)(1), unless the authorization is terminated or revoked sooner. Performed at [facility], [place] [address], [place], [place] [address]   Urine culture     Status: None   Collection Time: 11/20/19  5:25 AM   Specimen: Urine, Random  Result Value Ref Range Status   Specimen Description URINE, RANDOM  Final   Special Requests NONE  Final   Culture   Final    NO GROWTH Performed at [facility], [place] [address], [place], [place] [address]    Report Status 11/21/2019 FINAL  Final     Labs: Basic Metabolic Panel: Recent Labs  Lab 11/20/19 0025 11/21/19 0519 11/22/19 0532 11/23/19 0550 11/24/19 0610  NA 135 136 137 136 138  K 4.6 4.1 4.8 4.9 5.0  CL 101 101 104 104 107  CO2 22 24 23 23 24   GLUCOSE 133* 105* 98 91 99  BUN 12 13 12 10 14   CREATININE 0.92 0.80 0.65 0.61 0.64  CALCIUM 8.5* 8.1* 8.7* 8.5* 8.7*   Liver Function Tests: Recent Labs  Lab 11/20/19 0025 11/21/19 0519 11/22/19 0532 11/23/19 0550 11/24/19 0610  AST 49* 30 29 39 39  ALT 21 18 17 24  35  ALKPHOS 70 63 65 80 91   BILITOT 7.5* 7.2* 5.3* 2.3* 3.5*  PROT 6.3* 6.2* 6.5 6.3* 6.9  ALBUMIN 3.3* 3.0* 3.0* 2.8* 3.2*   No results for input(s): LIPASE, AMYLASE in the last 168 hours. No results for input(s): AMMONIA in the last 168 hours. CBC: Recent Labs  Lab 11/20/19 0025 11/20/19 2222 11/21/19 0519 11/22/19 0532 11/23/19 0550 11/24/19 0610  WBC 18.4* 21.1* 26.4* 16.9* 14.5* 13.2*  NEUTROABS 13.0*  --   --   --   --   --   HGB 7.7* 8.4* 8.1* 8.1* 7.4* 7.4*  HCT 21.1* 23.7* 22.5* 23.3* 21.0* 21.8*  MCV 85.8 85.6 85.6 87.3 85.4 87.9  PLT 275 307 294 282 322 419*   Cardiac Enzymes: No results for input(s): CKTOTAL, CKMB, CKMBINDEX, TROPONINI in the last 168 hours. BNP: Invalid input(s): POCBNP CBG: No results for input(s): GLUCAP in the last 168 hours.  Time coordinating discharge: 50 minutes  Signed:  Donia Pounds  APRN, MSN, FNP-C [facility] [place], [place] [address] [phone]   [facility] 11/25/2019, 1:51 PM

## 2019-12-01 ENCOUNTER — Ambulatory Visit (INDEPENDENT_AMBULATORY_CARE_PROVIDER_SITE_OTHER): Payer: Medicaid Other | Admitting: Family Medicine

## 2019-12-01 ENCOUNTER — Encounter: Payer: Self-pay | Admitting: Family Medicine

## 2019-12-01 ENCOUNTER — Other Ambulatory Visit: Payer: Self-pay

## 2019-12-01 VITALS — BP 121/71 | HR 88 | Temp 98.5°F | Ht 67.0 in | Wt 200.2 lb

## 2019-12-01 DIAGNOSIS — G894 Chronic pain syndrome: Secondary | ICD-10-CM

## 2019-12-01 DIAGNOSIS — D571 Sickle-cell disease without crisis: Secondary | ICD-10-CM

## 2019-12-01 DIAGNOSIS — Z09 Encounter for follow-up examination after completed treatment for conditions other than malignant neoplasm: Secondary | ICD-10-CM

## 2019-12-01 DIAGNOSIS — Z7689 Persons encountering health services in other specified circumstances: Secondary | ICD-10-CM

## 2019-12-01 DIAGNOSIS — F1190 Opioid use, unspecified, uncomplicated: Secondary | ICD-10-CM | POA: Diagnosis not present

## 2019-12-01 LAB — POCT URINALYSIS DIPSTICK
Bilirubin, UA: NEGATIVE
Blood, UA: NEGATIVE
Glucose, UA: NEGATIVE
Ketones, UA: NEGATIVE
Leukocytes, UA: NEGATIVE
Nitrite, UA: NEGATIVE
Protein, UA: POSITIVE — AB
Spec Grav, UA: 1.02 (ref 1.010–1.025)
Urobilinogen, UA: 1 E.U./dL
pH, UA: 6 (ref 5.0–8.0)
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
## 2019-12-03 DIAGNOSIS — H52223 Regular astigmatism, bilateral: Secondary | ICD-10-CM | POA: Diagnosis not present

## 2019-12-03 DIAGNOSIS — H5213 Myopia, bilateral: Secondary | ICD-10-CM | POA: Diagnosis not present

## 2019-12-03 DIAGNOSIS — H538 Other visual disturbances: Secondary | ICD-10-CM | POA: Diagnosis not present

## 2019-12-13 ENCOUNTER — Other Ambulatory Visit: Payer: Self-pay

## 2019-12-13 DIAGNOSIS — D571 Sickle-cell disease without crisis: Secondary | ICD-10-CM

## 2019-12-13 DIAGNOSIS — H5213 Myopia, bilateral: Secondary | ICD-10-CM | POA: Diagnosis not present

## 2019-12-14 ENCOUNTER — Ambulatory Visit: Payer: Medicaid Other | Admitting: Family Medicine

## 2019-12-14 ENCOUNTER — Other Ambulatory Visit: Payer: Self-pay

## 2019-12-14 DIAGNOSIS — R8290 Unspecified abnormal findings in urine: Secondary | ICD-10-CM

## 2019-12-20 ENCOUNTER — Other Ambulatory Visit: Payer: Self-pay

## 2019-12-20 MED ORDER — HYDROXYUREA 400 MG PO CAPS: 1600.0000 mg | ORAL_CAPSULE | Freq: Every day | ORAL | 0 refills | Status: DC

## 2019-12-21 ENCOUNTER — Other Ambulatory Visit: Payer: Self-pay | Admitting: Family Medicine

## 2019-12-21 DIAGNOSIS — D571 Sickle-cell disease without crisis: Secondary | ICD-10-CM

## 2019-12-21 MED ORDER — HYDROXYUREA 400 MG PO CAPS: 1600.0000 mg | ORAL_CAPSULE | Freq: Every day | ORAL | 6 refills | Status: DC

## 2019-12-27 ENCOUNTER — Other Ambulatory Visit: Payer: Self-pay | Admitting: Family Medicine

## 2019-12-27 ENCOUNTER — Telehealth: Payer: Self-pay | Admitting: Family Medicine

## 2019-12-27 DIAGNOSIS — D571 Sickle-cell disease without crisis: Secondary | ICD-10-CM

## 2019-12-27 MED ORDER — OXYCODONE HCL 10 MG PO TABS: 10.0000 mg | ORAL_TABLET | ORAL | 0 refills | Status: DC | PRN

## 2019-12-27 NOTE — Telephone Encounter (Signed)
Done

## 2019-12-28 ENCOUNTER — Telehealth: Payer: Self-pay | Admitting: Family Medicine

## 2019-12-28 ENCOUNTER — Ambulatory Visit: Payer: Medicaid Other | Admitting: Family Medicine

## 2019-12-28 NOTE — Telephone Encounter (Signed)
error 

## 2019-12-31 ENCOUNTER — Ambulatory Visit (INDEPENDENT_AMBULATORY_CARE_PROVIDER_SITE_OTHER): Payer: Medicaid Other | Admitting: Family Medicine

## 2019-12-31 ENCOUNTER — Other Ambulatory Visit: Payer: Self-pay

## 2019-12-31 ENCOUNTER — Encounter: Payer: Self-pay | Admitting: Family Medicine

## 2019-12-31 VITALS — BP 132/60 | HR 84 | Temp 98.6°F | Ht 67.0 in | Wt 207.2 lb

## 2019-12-31 DIAGNOSIS — T7840XA Allergy, unspecified, initial encounter: Secondary | ICD-10-CM | POA: Diagnosis not present

## 2019-12-31 DIAGNOSIS — R519 Headache, unspecified: Secondary | ICD-10-CM | POA: Diagnosis not present

## 2019-12-31 DIAGNOSIS — Z09 Encounter for follow-up examination after completed treatment for conditions other than malignant neoplasm: Secondary | ICD-10-CM

## 2019-12-31 DIAGNOSIS — R220 Localized swelling, mass and lump, head: Secondary | ICD-10-CM | POA: Diagnosis not present

## 2019-12-31 DIAGNOSIS — D571 Sickle-cell disease without crisis: Secondary | ICD-10-CM | POA: Diagnosis not present

## 2019-12-31 DIAGNOSIS — R52 Pain, unspecified: Secondary | ICD-10-CM | POA: Diagnosis not present

## 2019-12-31 DIAGNOSIS — R8290 Unspecified abnormal findings in urine: Secondary | ICD-10-CM

## 2019-12-31 LAB — POCT URINALYSIS DIPSTICK
Glucose, UA: NEGATIVE
Ketones, UA: NEGATIVE
Nitrite, UA: NEGATIVE
Protein, UA: POSITIVE — AB
Spec Grav, UA: 1.025 (ref 1.010–1.025)
Urobilinogen, UA: 4 E.U./dL — AB
pH, UA: 6.5 (ref 5.0–8.0)

## 2019-12-31 MED ORDER — DIPHENHYDRAMINE HCL 25 MG PO CAPS: 25.0000 mg | ORAL_CAPSULE | Freq: Four times a day (QID) | ORAL | 0 refills | Status: DC | PRN

## 2019-12-31 MED ORDER — IBUPROFEN 800 MG PO TABS: 800.0000 mg | ORAL_TABLET | Freq: Three times a day (TID) | ORAL | 3 refills | Status: DC | PRN
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
## 2020-01-01 ENCOUNTER — Encounter: Payer: Self-pay | Admitting: Family Medicine

## 2020-01-01 DIAGNOSIS — R52 Pain, unspecified: Secondary | ICD-10-CM | POA: Insufficient documentation

## 2020-01-01 DIAGNOSIS — R519 Headache, unspecified: Secondary | ICD-10-CM | POA: Insufficient documentation

## 2020-01-01 DIAGNOSIS — R220 Localized swelling, mass and lump, head: Secondary | ICD-10-CM | POA: Insufficient documentation

## 2020-01-01 DIAGNOSIS — T7840XA Allergy, unspecified, initial encounter: Secondary | ICD-10-CM | POA: Insufficient documentation

## 2020-01-02 LAB — URINE CULTURE: Organism ID, Bacteria: NO GROWTH

## 2020-01-03 ENCOUNTER — Ambulatory Visit: Payer: Medicaid Other | Admitting: Family Medicine

## 2020-01-11 ENCOUNTER — Ambulatory Visit: Payer: Medicaid Other | Admitting: Family Medicine

## 2020-01-24 ENCOUNTER — Other Ambulatory Visit: Payer: Self-pay | Admitting: Family Medicine

## 2020-01-24 ENCOUNTER — Telehealth: Payer: Self-pay | Admitting: Family Medicine

## 2020-01-24 DIAGNOSIS — D571 Sickle-cell disease without crisis: Secondary | ICD-10-CM

## 2020-01-24 MED ORDER — OXYCODONE HCL 10 MG PO TABS: 10.0000 mg | ORAL_TABLET | ORAL | 0 refills | Status: DC | PRN

## 2020-01-25 NOTE — Telephone Encounter (Signed)
Done

## 2020-01-28 ENCOUNTER — Ambulatory Visit: Payer: Medicaid Other | Admitting: Family Medicine

## 2020-01-30 ENCOUNTER — Inpatient Hospital Stay (HOSPITAL_COMMUNITY)
Admission: EM | Admit: 2020-01-30 | Discharge: 2020-02-04 | DRG: 811 | Disposition: A | Discharge status: Home / Self Care | Payer: Medicaid Other | Attending: Internal Medicine | Admitting: Internal Medicine

## 2020-01-30 ENCOUNTER — Inpatient Hospital Stay (HOSPITAL_COMMUNITY): Payer: Medicaid Other

## 2020-01-30 ENCOUNTER — Emergency Department (HOSPITAL_COMMUNITY): Payer: Medicaid Other

## 2020-01-30 ENCOUNTER — Other Ambulatory Visit: Payer: Self-pay

## 2020-01-30 DIAGNOSIS — R0902 Hypoxemia: Secondary | ICD-10-CM | POA: Diagnosis present

## 2020-01-30 DIAGNOSIS — Z79899 Other long term (current) drug therapy: Secondary | ICD-10-CM | POA: Diagnosis not present

## 2020-01-30 DIAGNOSIS — N08 Glomerular disorders in diseases classified elsewhere: Secondary | ICD-10-CM | POA: Diagnosis present

## 2020-01-30 DIAGNOSIS — D5700 Hb-SS disease with crisis, unspecified: Secondary | ICD-10-CM | POA: Diagnosis not present

## 2020-01-30 DIAGNOSIS — Z832 Family history of diseases of the blood and blood-forming organs and certain disorders involving the immune mechanism: Secondary | ICD-10-CM | POA: Diagnosis not present

## 2020-01-30 DIAGNOSIS — Z8349 Family history of other endocrine, nutritional and metabolic diseases: Secondary | ICD-10-CM | POA: Diagnosis not present

## 2020-01-30 DIAGNOSIS — I10 Essential (primary) hypertension: Secondary | ICD-10-CM | POA: Diagnosis not present

## 2020-01-30 DIAGNOSIS — I499 Cardiac arrhythmia, unspecified: Secondary | ICD-10-CM | POA: Diagnosis not present

## 2020-01-30 DIAGNOSIS — Y95 Nosocomial condition: Secondary | ICD-10-CM | POA: Diagnosis not present

## 2020-01-30 DIAGNOSIS — D72829 Elevated white blood cell count, unspecified: Secondary | ICD-10-CM | POA: Diagnosis not present

## 2020-01-30 DIAGNOSIS — R918 Other nonspecific abnormal finding of lung field: Secondary | ICD-10-CM | POA: Diagnosis not present

## 2020-01-30 DIAGNOSIS — G894 Chronic pain syndrome: Secondary | ICD-10-CM | POA: Diagnosis not present

## 2020-01-30 DIAGNOSIS — Z8249 Family history of ischemic heart disease and other diseases of the circulatory system: Secondary | ICD-10-CM

## 2020-01-30 DIAGNOSIS — J189 Pneumonia, unspecified organism: Secondary | ICD-10-CM | POA: Diagnosis present

## 2020-01-30 DIAGNOSIS — R079 Chest pain, unspecified: Secondary | ICD-10-CM | POA: Diagnosis not present

## 2020-01-30 DIAGNOSIS — Z20822 Contact with and (suspected) exposure to covid-19: Secondary | ICD-10-CM | POA: Diagnosis not present

## 2020-01-30 DIAGNOSIS — R52 Pain, unspecified: Secondary | ICD-10-CM | POA: Diagnosis not present

## 2020-01-30 DIAGNOSIS — R0602 Shortness of breath: Secondary | ICD-10-CM | POA: Diagnosis not present

## 2020-01-30 DIAGNOSIS — J4520 Mild intermittent asthma, uncomplicated: Secondary | ICD-10-CM | POA: Diagnosis present

## 2020-01-30 DIAGNOSIS — R0689 Other abnormalities of breathing: Secondary | ICD-10-CM | POA: Diagnosis not present

## 2020-01-30 LAB — HIV ANTIBODY (ROUTINE TESTING W REFLEX): HIV Screen 4th Generation wRfx: NONREACTIVE

## 2020-01-30 LAB — COMPREHENSIVE METABOLIC PANEL
ALT: 21 U/L (ref 0–44)
AST: 56 U/L — ABNORMAL HIGH (ref 15–41)
Albumin: 4.3 g/dL (ref 3.5–5.0)
Alkaline Phosphatase: 80 U/L (ref 38–126)
Anion gap: 8 (ref 5–15)
BUN: 9 mg/dL (ref 6–20)
CO2: 24 mmol/L (ref 22–32)
Calcium: 8.4 mg/dL — ABNORMAL LOW (ref 8.9–10.3)
Chloride: 105 mmol/L (ref 98–111)
Creatinine, Ser: 0.57 mg/dL — ABNORMAL LOW (ref 0.61–1.24)
GFR calc Af Amer: >60 mL/min (ref >60)
GFR calc non Af Amer: >60 mL/min (ref >60)
Glucose, Bld: 122 mg/dL — ABNORMAL HIGH (ref 70–99)
Potassium: 3.8 mmol/L (ref 3.5–5.1)
Sodium: 137 mmol/L (ref 135–145)
Total Bilirubin: 4.6 mg/dL — ABNORMAL HIGH (ref 0.3–1.2)
Total Protein: 7.3 g/dL (ref 6.5–8.1)

## 2020-01-30 LAB — CBC
HCT: 26.7 % — ABNORMAL LOW (ref 39.0–52.0)
Hemoglobin: 9.4 g/dL — ABNORMAL LOW (ref 13.0–17.0)
MCH: 29.9 pg (ref 26.0–34.0)
MCHC: 35.2 g/dL (ref 30.0–36.0)
MCV: 85 fL (ref 80.0–100.0)
Platelets: 322 10*3/uL (ref 150–400)
RBC: 3.14 MIL/uL — ABNORMAL LOW (ref 4.22–5.81)
RDW: 19.5 % — ABNORMAL HIGH (ref 11.5–15.5)
WBC: 20.3 10*3/uL — ABNORMAL HIGH (ref 4.0–10.5)
nRBC: 2.1 % — ABNORMAL HIGH (ref 0.0–0.2)

## 2020-01-30 LAB — SARS CORONAVIRUS 2 (TAT 6-24 HRS): SARS Coronavirus 2: NEGATIVE

## 2020-01-30 LAB — CBC WITH DIFFERENTIAL/PLATELET
Abs Immature Granulocytes: 0.4 10*3/uL — ABNORMAL HIGH (ref 0.00–0.07)
Basophils Absolute: 0.2 10*3/uL — ABNORMAL HIGH (ref 0.0–0.1)
Basophils Relative: 1 %
Eosinophils Absolute: 0.2 10*3/uL (ref 0.0–0.5)
Eosinophils Relative: 2 %
HCT: 28.4 % — ABNORMAL LOW (ref 39.0–52.0)
Hemoglobin: 10.2 g/dL — ABNORMAL LOW (ref 13.0–17.0)
Immature Granulocytes: 3 %
Lymphocytes Relative: 42 %
Lymphs Abs: 5.6 10*3/uL — ABNORMAL HIGH (ref 0.7–4.0)
MCH: 30.6 pg (ref 26.0–34.0)
MCHC: 35.9 g/dL (ref 30.0–36.0)
MCV: 85.3 fL (ref 80.0–100.0)
Monocytes Absolute: 1.1 10*3/uL — ABNORMAL HIGH (ref 0.1–1.0)
Monocytes Relative: 9 %
Neutro Abs: 5.9 10*3/uL (ref 1.7–7.7)
Neutrophils Relative %: 43 %
Platelets: 372 10*3/uL (ref 150–400)
RBC: 3.33 MIL/uL — ABNORMAL LOW (ref 4.22–5.81)
RDW: 19.5 % — ABNORMAL HIGH (ref 11.5–15.5)
WBC: 13.5 10*3/uL — ABNORMAL HIGH (ref 4.0–10.5)
nRBC: 0.7 % — ABNORMAL HIGH (ref 0.0–0.2)

## 2020-01-30 LAB — RETICULOCYTES
Immature Retic Fract: 42.7 % — ABNORMAL HIGH (ref 2.3–15.9)
RBC.: 3.31 MIL/uL — ABNORMAL LOW (ref 4.22–5.81)
Retic Count, Absolute: 255.2 K/uL — ABNORMAL HIGH (ref 19.0–186.0)
Retic Ct Pct: 7.7 % — ABNORMAL HIGH (ref 0.4–3.1)

## 2020-01-30 LAB — BLOOD GAS, ARTERIAL
Acid-base deficit: 0.6 mmol/L (ref 0.0–2.0)
Bicarbonate: 25 mmol/L (ref 20.0–28.0)
O2 Content: 6 L/min
O2 Saturation: 95.3 %
Patient temperature: 99.6
pCO2 arterial: 50.4 mmHg — ABNORMAL HIGH (ref 32.0–48.0)
pH, Arterial: 7.319 — ABNORMAL LOW (ref 7.350–7.450)
pO2, Arterial: 97.4 mmHg (ref 83.0–108.0)

## 2020-01-30 LAB — COMPREHENSIVE METABOLIC PANEL WITH GFR
ALT: 25 U/L (ref 0–44)
AST: 48 U/L — ABNORMAL HIGH (ref 15–41)
Albumin: 4.3 g/dL (ref 3.5–5.0)
Alkaline Phosphatase: 77 U/L (ref 38–126)
Anion gap: 11 (ref 5–15)
BUN: 8 mg/dL (ref 6–20)
CO2: 23 mmol/L (ref 22–32)
Calcium: 9.4 mg/dL (ref 8.9–10.3)
Chloride: 105 mmol/L (ref 98–111)
Creatinine, Ser: 0.57 mg/dL — ABNORMAL LOW (ref 0.61–1.24)
GFR calc Af Amer: >60 mL/min (ref >60)
GFR calc non Af Amer: >60 mL/min (ref >60)
Glucose, Bld: 104 mg/dL — ABNORMAL HIGH (ref 70–99)
Potassium: 4 mmol/L (ref 3.5–5.1)
Sodium: 139 mmol/L (ref 135–145)
Total Bilirubin: 3.3 mg/dL — ABNORMAL HIGH (ref 0.3–1.2)
Total Protein: 7.4 g/dL (ref 6.5–8.1)

## 2020-01-30 MED ORDER — HYDROMORPHONE HCL 2 MG/ML IJ SOLN
2.0000 mg | INTRAMUSCULAR | Status: AC
  Administered 2020-01-30: 2 mg via INTRAVENOUS
  Filled 2020-01-30: qty 1

## 2020-01-30 MED ORDER — SENNOSIDES-DOCUSATE SODIUM 8.6-50 MG PO TABS
1.0000 | ORAL_TABLET | Freq: Two times a day (BID) | ORAL | Status: DC
  Administered 2020-01-30 – 2020-02-04 (×10): 1 via ORAL
  Filled 2020-01-30 (×11): qty 1

## 2020-01-30 MED ORDER — IPRATROPIUM-ALBUTEROL 0.5-2.5 (3) MG/3ML IN SOLN
3.0000 mL | RESPIRATORY_TRACT | Status: DC | PRN
  Administered 2020-01-30 – 2020-01-31 (×2): 3 mL via RESPIRATORY_TRACT
  Filled 2020-01-30 (×2): qty 3

## 2020-01-30 MED ORDER — LOSARTAN POTASSIUM 50 MG PO TABS
50.0000 mg | ORAL_TABLET | Freq: Every day | ORAL | Status: DC
  Administered 2020-01-30 – 2020-02-04 (×6): 50 mg via ORAL
  Filled 2020-01-30 (×6): qty 1

## 2020-01-30 MED ORDER — KETOROLAC TROMETHAMINE 15 MG/ML IJ SOLN
15.0000 mg | INTRAMUSCULAR | Status: AC
  Administered 2020-01-30: 15 mg via INTRAVENOUS
  Filled 2020-01-30: qty 1

## 2020-01-30 MED ORDER — SODIUM CHLORIDE 0.9 % IV SOLN
25.0000 mg | INTRAVENOUS | Status: DC | PRN
  Filled 2020-01-30: qty 0.5

## 2020-01-30 MED ORDER — DEXTROSE-NACL 5-0.45 % IV SOLN: INTRAVENOUS | Status: DC

## 2020-01-30 MED ORDER — SODIUM CHLORIDE 0.9 % IV BOLUS
1000.0000 mL | Freq: Once | INTRAVENOUS | Status: AC
  Administered 2020-01-30: 1000 mL via INTRAVENOUS

## 2020-01-30 MED ORDER — HYDROXYUREA 300 MG PO CAPS: 1500.0000 mg | ORAL_CAPSULE | Freq: Every day | ORAL | Status: DC

## 2020-01-30 MED ORDER — POLYETHYLENE GLYCOL 3350 17 G PO PACK
17.0000 g | PACK | Freq: Every day | ORAL | Status: DC | PRN
  Administered 2020-02-02: 17 g via ORAL
  Filled 2020-01-30: qty 1

## 2020-01-30 MED ORDER — OXYCODONE HCL 5 MG PO TABS
10.0000 mg | ORAL_TABLET | ORAL | Status: DC | PRN
  Administered 2020-01-30 – 2020-02-03 (×2): 10 mg via ORAL
  Filled 2020-01-30 (×2): qty 2

## 2020-01-30 MED ORDER — KETOROLAC TROMETHAMINE 15 MG/ML IJ SOLN
15.0000 mg | Freq: Four times a day (QID) | INTRAMUSCULAR | Status: AC
  Administered 2020-01-30 – 2020-02-04 (×20): 15 mg via INTRAVENOUS
  Filled 2020-01-30 (×20): qty 1

## 2020-01-30 MED ORDER — HYDROMORPHONE 1 MG/ML IV SOLN
INTRAVENOUS | Status: DC
  Administered 2020-01-30: 1.5 mg via INTRAVENOUS
  Administered 2020-01-30: 3.5 mg via INTRAVENOUS
  Administered 2020-01-30: 1.5 mg via INTRAVENOUS
  Administered 2020-01-30: 30 mg via INTRAVENOUS
  Administered 2020-01-31: 5.5 mg via INTRAVENOUS
  Administered 2020-01-31: 1.5 mg via INTRAVENOUS
  Administered 2020-01-31: 0.5 mg via INTRAVENOUS
  Administered 2020-02-01: 30 mg via INTRAVENOUS
  Administered 2020-02-01: 1.5 mg via INTRAVENOUS
  Administered 2020-02-01: 4 mg via INTRAVENOUS
  Administered 2020-02-01: 2.5 mg via INTRAVENOUS
  Filled 2020-01-30 (×2): qty 30

## 2020-01-30 MED ORDER — NALOXONE HCL 0.4 MG/ML IJ SOLN: 0.4000 mg | INTRAMUSCULAR | Status: DC | PRN

## 2020-01-30 MED ORDER — HYDROMORPHONE HCL 2 MG/ML IJ SOLN
2.0000 mg | Freq: Once | INTRAMUSCULAR | Status: AC
  Administered 2020-01-30: 2 mg via INTRAVENOUS
  Filled 2020-01-30: qty 1

## 2020-01-30 MED ORDER — DIPHENHYDRAMINE HCL 25 MG PO CAPS: 25.0000 mg | ORAL_CAPSULE | ORAL | Status: DC | PRN

## 2020-01-30 MED ORDER — HYDROXYUREA 500 MG PO CAPS
1500.0000 mg | ORAL_CAPSULE | Freq: Every day | ORAL | Status: DC
  Administered 2020-01-30 – 2020-02-04 (×6): 1500 mg via ORAL
  Filled 2020-01-30 (×6): qty 3

## 2020-01-30 MED ORDER — ONDANSETRON HCL 4 MG/2ML IJ SOLN: 4.0000 mg | Freq: Four times a day (QID) | INTRAMUSCULAR | Status: DC | PRN

## 2020-01-30 MED ORDER — ENOXAPARIN SODIUM 40 MG/0.4ML ~~LOC~~ SOLN
40.0000 mg | SUBCUTANEOUS | Status: DC
  Filled 2020-01-30 (×5): qty 0.4

## 2020-01-30 MED ORDER — SODIUM CHLORIDE 0.9% FLUSH: 9.0000 mL | INTRAVENOUS | Status: DC | PRN

## 2020-01-30 MED ORDER — DIPHENHYDRAMINE HCL 25 MG PO CAPS
25.0000 mg | ORAL_CAPSULE | ORAL | Status: DC | PRN
  Administered 2020-01-30: 50 mg via ORAL
  Filled 2020-01-30: qty 2

## 2020-01-30 MED ORDER — FOLIC ACID 1 MG PO TABS
1.0000 mg | ORAL_TABLET | Freq: Every day | ORAL | Status: DC
  Administered 2020-01-30 – 2020-02-04 (×6): 1 mg via ORAL
  Filled 2020-01-30 (×6): qty 1

## 2020-01-30 NOTE — ED Notes (Signed)
This RN entered room to find pts spO2 sats to be 81%.  Pt laying supine, on phone, able to speak in full sentences. Nasal cannula laying next to pt in bed.  [place] replaced, pt educated on importance of leaving [place] in place.  SpO2 96% on 3L [place]

## 2020-01-30 NOTE — Progress Notes (Signed)
CSW acknowledging consult regarding legal guardianship.  CSW met with patient at bedside to introduce self and role. Patient states they are their own legal guardian and any indication that they have a guardian probably remains in their chart from when they were a minor. CSW reviewed chart as well, no indication of guardianship.  Patient states they have no other questions, concerns, or needs from CSW.  CSW signing off.  Stephanie Acre, MSW, LCSW-A Clinical Social Worker

## 2020-01-30 NOTE — Significant Event (Signed)
Rapid Response Event Note  Overview: Time Called: 2130 Arrival Time: 2200 Event Type: Respiratory  Initial Focused Assessment: RRT called to for increased MEWS score and patient's saturation dropping. Upon arrival to room, patients saturation at 87-89 on 4 liters of oxygen. Increased oxygen to 6 liters. Primary nurse notified Blount NP    Interventions:  Encouraged IS at least every hour while awake. Encouraged patient to take deep breaths and to use PCA pump to improve pain.  ABG 7. 3, Co2 50.4  CXR  Lower lung volumes and increased bibasilar pulmonary opacity since this morning. Differential considerations include atelectasis, acute chest syndrome, aspiration. Lab Pending.  Bolus of NS  Dilaudid 2 mg x 1. E\   Plan of Care (if not transferred): Transfer to telemetry for closer monitoring.  2245 Blount at bedside.   Event Summary: Name of Physician Notified: Bruna Potter NP at 2200  [facility] MSN, RN-BC  [facility] ICU/Stepdown  [facility]

## 2020-01-30 NOTE — Progress Notes (Signed)
Received report at 109
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
## 2020-01-30 NOTE — ED Triage Notes (Signed)
Pt BIBA from home.   Per EMS- C/o back pain starting this AM.  Pt reports home meds providing no relief.

## 2020-01-30 NOTE — Progress Notes (Signed)
Patient just triggered yellow MEWS score. His pulse has been up all day, but just now indicated yellow MEWS. Patient's saturation was dropping due to patient taking off cannula. This has now been taped on him, and oxygen increased to 4 L. He now has a saturation of 93 (4L) Patient only complains of pain. He was given a breakthrough oral medication, and encouraged to press him pump. He was also educated on why it was important to keep on nasal cannula.  [facility] hospitalist was notified. Rapid Response RN Britta Mccreedy was also notified. She will be up shortly to assess patient.Priscille Kluver   Vital Signs MEWS/VS Documentation      01/30/2020 1545 01/30/2020 1802 01/30/2020 2049 01/30/2020 2110   MEWS Score:  1  1  1  2    MEWS Score Color:   Green  Green  Yellow   Resp:  20  20  14  20    Pulse:  --  (!) 105  --  (!) 114   BP:  --  (!) 147/87  --  (!) 141/88   Temp:  --  98 F (36.7 C)  --  99.1 F (37.3 C)   O2 Device:  Nasal Cannula  Room Air  Nasal Cannula  Nasal Cannula   O2 Flow Rate (L/min):  2 L/min  --  4 L/min  4 L/min           Chilton Si M 01/30/2020,9:28 PM
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
## 2020-01-30 NOTE — Progress Notes (Addendum)
Rapid Response RN , Respiratory, and MD came to bedside. Patient was bolused 1L, given 2 mg IV dilaudid x1, Chest xray obtained, Neb given, and ABG collected. Patient is 94 percent on 5 L at this time. Pulse still tachy, but improved.  Patient does have elevated respiratory rate (upper 20's).  Report was given to 4th floor RN and patient was transferred to telemetry per new orders. Priscille Kluver

## 2020-01-31 ENCOUNTER — Inpatient Hospital Stay (HOSPITAL_COMMUNITY): Payer: Medicaid Other

## 2020-01-31 ENCOUNTER — Encounter (HOSPITAL_COMMUNITY): Payer: Self-pay | Admitting: Internal Medicine

## 2020-01-31 DIAGNOSIS — D5700 Hb-SS disease with crisis, unspecified: Principal | ICD-10-CM

## 2020-01-31 DIAGNOSIS — G894 Chronic pain syndrome: Secondary | ICD-10-CM

## 2020-01-31 DIAGNOSIS — R0902 Hypoxemia: Secondary | ICD-10-CM

## 2020-01-31 LAB — COMPREHENSIVE METABOLIC PANEL
ALT: 23 U/L (ref 0–44)
AST: 68 U/L — ABNORMAL HIGH (ref 15–41)
Albumin: 3.9 g/dL (ref 3.5–5.0)
Alkaline Phosphatase: 78 U/L (ref 38–126)
Anion gap: 8 (ref 5–15)
BUN: 9 mg/dL (ref 6–20)
CO2: 23 mmol/L (ref 22–32)
Calcium: 8.3 mg/dL — ABNORMAL LOW (ref 8.9–10.3)
Chloride: 105 mmol/L (ref 98–111)
Creatinine, Ser: 0.6 mg/dL — ABNORMAL LOW (ref 0.61–1.24)
GFR calc Af Amer: >60 mL/min (ref >60)
GFR calc non Af Amer: >60 mL/min (ref >60)
Glucose, Bld: 130 mg/dL — ABNORMAL HIGH (ref 70–99)
Potassium: 3.7 mmol/L (ref 3.5–5.1)
Sodium: 136 mmol/L (ref 135–145)
Total Bilirubin: 4 mg/dL — ABNORMAL HIGH (ref 0.3–1.2)
Total Protein: 6.8 g/dL (ref 6.5–8.1)

## 2020-01-31 LAB — CBC WITH DIFFERENTIAL/PLATELET
Abs Immature Granulocytes: 0.09 10*3/uL — ABNORMAL HIGH (ref 0.00–0.07)
Basophils Absolute: 0.1 10*3/uL (ref 0.0–0.1)
Basophils Relative: 1 %
Eosinophils Absolute: 0 10*3/uL (ref 0.0–0.5)
Eosinophils Relative: 0 %
HCT: 26.3 % — ABNORMAL LOW (ref 39.0–52.0)
Hemoglobin: 9.3 g/dL — ABNORMAL LOW (ref 13.0–17.0)
Immature Granulocytes: 1 %
Lymphocytes Relative: 20 %
Lymphs Abs: 3.7 10*3/uL (ref 0.7–4.0)
MCH: 30 pg (ref 26.0–34.0)
MCHC: 35.4 g/dL (ref 30.0–36.0)
MCV: 84.8 fL (ref 80.0–100.0)
Monocytes Absolute: 1.8 10*3/uL — ABNORMAL HIGH (ref 0.1–1.0)
Monocytes Relative: 10 %
Neutro Abs: 12.8 10*3/uL — ABNORMAL HIGH (ref 1.7–7.7)
Neutrophils Relative %: 68 %
Platelets: 314 10*3/uL (ref 150–400)
RBC: 3.1 MIL/uL — ABNORMAL LOW (ref 4.22–5.81)
RDW: 19.9 % — ABNORMAL HIGH (ref 11.5–15.5)
WBC: 18.4 10*3/uL — ABNORMAL HIGH (ref 4.0–10.5)
nRBC: 2 % — ABNORMAL HIGH (ref 0.0–0.2)

## 2020-01-31 LAB — MRSA PCR SCREENING: MRSA by PCR: NEGATIVE

## 2020-01-31 LAB — URINALYSIS, ROUTINE W REFLEX MICROSCOPIC
Bacteria, UA: NONE SEEN
Bilirubin Urine: NEGATIVE
Glucose, UA: NEGATIVE mg/dL
Ketones, ur: NEGATIVE mg/dL
Leukocytes,Ua: NEGATIVE
Nitrite: NEGATIVE
Protein, ur: 100 mg/dL — AB
Specific Gravity, Urine: 1.011 (ref 1.005–1.030)
pH: 6 (ref 5.0–8.0)

## 2020-01-31 LAB — STREP PNEUMONIAE URINARY ANTIGEN: Strep Pneumo Urinary Antigen: NEGATIVE

## 2020-01-31 MED ORDER — DIPHENHYDRAMINE HCL 50 MG/ML IJ SOLN
25.0000 mg | Freq: Once | INTRAMUSCULAR | Status: AC
  Administered 2020-01-31: 25 mg via INTRAVENOUS
  Filled 2020-01-31: qty 1

## 2020-01-31 MED ORDER — SODIUM CHLORIDE 0.9 % IV SOLN
2.0000 g | Freq: Three times a day (TID) | INTRAVENOUS | Status: DC
  Administered 2020-01-31 – 2020-02-03 (×9): 2 g via INTRAVENOUS
  Filled 2020-01-31 (×10): qty 2

## 2020-01-31 MED ORDER — IOHEXOL 350 MG/ML SOLN
100.0000 mL | Freq: Once | INTRAVENOUS | Status: AC | PRN
  Administered 2020-01-31: 100 mL via INTRAVENOUS

## 2020-01-31 MED ORDER — SODIUM CHLORIDE (PF) 0.9 % IJ SOLN
INTRAMUSCULAR | Status: AC
  Filled 2020-01-31: qty 50

## 2020-01-31 MED ORDER — VANCOMYCIN HCL 2000 MG/400ML IV SOLN
2000.0000 mg | Freq: Once | INTRAVENOUS | Status: AC
  Administered 2020-01-31: 2000 mg via INTRAVENOUS
  Filled 2020-01-31: qty 400

## 2020-01-31 MED ORDER — VANCOMYCIN HCL 1500 MG/300ML IV SOLN
1500.0000 mg | Freq: Two times a day (BID) | INTRAVENOUS | Status: DC
  Administered 2020-02-01: 1500 mg via INTRAVENOUS
  Filled 2020-01-31 (×2): qty 300

## 2020-01-31 NOTE — Progress Notes (Signed)
MD notified and aware of patient's condition. MD order to continue to monitor patient and continue to use IS and NRB. Marcelle Overlie, RN

## 2020-01-31 NOTE — Progress Notes (Signed)
Patient has received breathing treatment, patient is currently on 15 liters of O2 per NRB, sat is ranging between 94-97%. Respirations 28, and pulse 108. Patient is stable and being mentored, Marcelle Overlie, RN

## 2020-01-31 NOTE — Plan of Care (Signed)
  Problem: Self-Care: Goal: Ability to incorporate actions that prevent/reduce pain crisis will improve Outcome: Progressing   Problem: Bowel/Gastric: Goal: Gut motility will be maintained Outcome: Progressing   Problem: Tissue Perfusion: Goal: Complications related to inadequate tissue perfusion will be avoided or minimized Outcome: Progressing   Problem: Education: Goal: Knowledge of General Education information will improve Description: Including pain rating scale, medication(s)/side effects and non-pharmacologic comfort measures Outcome: Progressing   Problem: Activity: Goal: Risk for activity intolerance will decrease Outcome: Progressing   Problem: Elimination: Goal: Will not experience complications related to bowel motility Outcome: Progressing   Problem: Skin Integrity: Goal: Risk for impaired skin integrity will decrease Outcome: Progressing

## 2020-01-31 NOTE — Progress Notes (Signed)
Pharmacy Antibiotic Note  [name] Hill is a 19 y.o. male admitted on 01/30/2020 with HAP.  Pharmacy has been consulted for vanc/cefepime dosing.  Plan:  Vanc 2g x 1 then 1500mg  IV q12 - goal AUC 400-550  Cefepime 2g IV q8  Follow up MRSA PCR - if negative, d/c vanc  Height: 5\' 8"  (172.7 cm) Weight: 210 lb 8.6 oz (95.5 kg) IBW/kg (Calculated) : 68.4  Temp (24hrs), Avg:98.7 F (37.1 C), Min:98 F (36.7 C), Max:99.3 F (37.4 C)  Recent Labs  Lab 01/30/20 0739 01/30/20 2229 01/31/20 0523  WBC 13.5* 20.3* 18.4*  CREATININE 0.57* 0.57* 0.60*    Estimated Creatinine Clearance: 165 mL/min (A) (by C-G formula based on SCr of 0.6 mg/dL (L)).    Allergies  Allergen Reactions  . Other Cough    Seasonal allergies.  . Lisinopril Cough     Thank you for allowing pharmacy to be a part of this patient's care.  2230 01/31/2020 1:15 PM

## 2020-01-31 NOTE — Progress Notes (Signed)
Rapid response called due to patients rapid respirations and shallow breathing. Respirations increasing to upper 40s, but fluctuates back to normal when asked to take deep breaths. Patient is on 5 liters O2 and sat is reading upper 80s to lower 90s. O2 increased to 6 liters. Respiratory called and breathing treatment currently being administered. Marcelle Overlie, RN

## 2020-01-31 NOTE — Significant Event (Signed)
Rapid Response Event Note  Overview: Time Called: 0445 Arrival Time: 0450 Event Type: Respiratory, MEWS  Initial Focused Assessment: Rapid Response called for elevated MEWS, Respirations increasing to 30-40. Oxygen saturation at 88-90 % on 5 liters of oxygen. Patient is sleepy, but easily aroused with verbal stimulation. Patient  States pain level is at 5. A/O x4 and obeys commands. Respiratory therapy at bedside PRN albuterol treatment given.   Interventions: PRN albertol treatment Non-rebreather @ 15 liters maintaining O2 saturations at 93-97%.  Encouraged patient to continue to use IS   Plan of Care (if not transferred): Continue to monitor patient and have patient to use IS and NRB.   Event Summary: Primary nurse notified [place] at [phone]   Outcome: Stayed in room and stabalized  Event End Time: 0540 [facility] MSN, RN-BC  [facility] ICU/Stepdown  [facility]

## 2020-01-31 NOTE — Progress Notes (Addendum)
Subjective: [name] Hill, a 19 year old male with a medical history significant for sickle cell disease type SS, chronic pain syndrome, opiate dependence and tolerance, history of mild intermittent asthma, and history of anemia of chronic disease was admitted for sickle cell pain crisis.  Overnight, patient's oxygen needs have increased.  Saturation 88-90%.  At this time, patient is on nonrebreather at 15 L maintaining oxygen saturation at 92-97%.  Chest x-ray shows lower lung volumes and increased bibasilar pulmonary opacity.  Possibly acute chest syndrome. Patient negative for COVID-19 infection.  Patient endorses shortness of breath.  He is afebrile.  Current oxygen saturation is 93% on NRB 15 L.  Patient also endorses chest pain.  Pain intensity is 5/10.  No headache, dizziness, paresthesias, nausea, vomiting, or diarrhea.  Patient endorsed some urinary retention earlier.  He says it was difficult to start a urine stream.  Problem has resolved.  Objective:  Vital signs in last 24 hours:  Vitals:   01/31/20 0500 01/31/20 0542 01/31/20 0740 01/31/20 0852  BP:  (!) 142/76 140/72   Pulse:  100 79   Resp:  (!) 26 (!) 28 (!) 27  Temp:  99.3 F (37.4 C) 98.2 F (36.8 C)   TempSrc:  Oral Axillary   SpO2: 97% 100% 98% 99%  Weight:      Height:        Intake/Output from previous day:   Intake/Output Summary (Last 24 hours) at 01/31/2020 1014 Last data filed at 01/31/2020 0854 Gross per 24 hour  Intake 4549.39 ml  Output 1275 ml  Net 3274.39 ml   Physical Exam Constitutional:      Appearance: Normal appearance.  Eyes:     Pupils: Pupils are equal, round, and reactive to light.  Cardiovascular:     Rate and Rhythm: Normal rate and regular rhythm.     Pulses: Normal pulses.  Pulmonary:     Effort: Tachypnea and accessory muscle usage present. No respiratory distress.     Breath sounds: Normal breath sounds. No decreased breath sounds or wheezing.  Abdominal:     General: Bowel  sounds are normal.  Neurological:     Mental Status: He is alert.  Psychiatric:        Mood and Affect: Mood normal.        Judgment: Judgment normal.     Lab Results:  Basic Metabolic Panel:    Component Value Date/Time   NA 136 01/31/2020 0523   NA 142 06/26/2018 1416   K 3.7 01/31/2020 0523   CL 105 01/31/2020 0523   CO2 23 01/31/2020 0523   BUN 9 01/31/2020 0523   BUN 7 06/26/2018 1416   CREATININE 0.60 (L) 01/31/2020 0523   GLUCOSE 130 (H) 01/31/2020 0523   CALCIUM 8.3 (L) 01/31/2020 0523   CBC:    Component Value Date/Time   WBC 18.4 (H) 01/31/2020 0523   HGB 9.3 (L) 01/31/2020 0523   HGB 9.6 (L) 06/26/2018 1416   HCT 26.3 (L) 01/31/2020 0523   HCT 28.5 (L) 06/26/2018 1416   PLT 314 01/31/2020 0523   PLT 405 06/26/2018 1416   MCV 84.8 01/31/2020 0523   MCV 92 06/26/2018 1416   NEUTROABS 12.8 (H) 01/31/2020 0523   NEUTROABS 3.7 06/26/2018 1416   LYMPHSABS 3.7 01/31/2020 0523   LYMPHSABS 3.3 (H) 06/26/2018 1416   MONOABS 1.8 (H) 01/31/2020 0523   EOSABS 0.0 01/31/2020 0523   EOSABS 0.2 06/26/2018 1416   BASOSABS 0.1 01/31/2020 0523   BASOSABS 0.1  06/26/2018 1416    Recent Results (from the past 240 hour(s))  SARS CORONAVIRUS 2 (TAT 6-24 HRS) Nasopharyngeal Nasopharyngeal Swab     Status: None   Collection Time: 01/30/20  1:38 PM   Specimen: Nasopharyngeal Swab  Result Value Ref Range Status   SARS Coronavirus 2 NEGATIVE NEGATIVE Final    Comment: (NOTE) SARS-CoV-2 target nucleic acids are NOT DETECTED. The SARS-CoV-2 RNA is generally detectable in upper and lower respiratory specimens during the acute phase of infection. Negative results do not preclude SARS-CoV-2 infection, do not rule out co-infections with other pathogens, and should not be used as the sole basis for treatment or other patient management decisions. Negative results must be combined with clinical observations, patient history, and epidemiological information. The expected result is  Negative. Fact Sheet for Patients: HairSlick.no Fact Sheet for Healthcare Providers: quierodirigir.com This test is not yet approved or cleared by the [place] FDA and  has been authorized for detection and/or diagnosis of SARS-CoV-2 by FDA under an Emergency Use Authorization (EUA). This EUA will remain  in effect (meaning this test can be used) for the duration of the COVID-19 declaration under Section 56 4(b)(1) of the Act, 21 U.S.C. section 360bbb-3(b)(1), unless the authorization is terminated or revoked sooner. Performed at [facility], [address] [place], [place] [address]     Studies/Results: DG Chest 2 View  Result Date: 01/30/2020 CLINICAL DATA:  Chest pain EXAM: CHEST - 2 VIEW COMPARISON:  11/20/2019 FINDINGS: Mild cardiomegaly. Both lungs are clear. No pleural effusion or pneumothorax. Morphologic sequelae of sickle cell anemia noted within the spine. IMPRESSION: No active cardiopulmonary disease. Electronically Signed   By: Guadlupe Spanish M.D.   On: 01/30/2020 08:16   DG CHEST PORT 1 VIEW  Result Date: 01/30/2020 CLINICAL DATA:  19 year old male with cardiac arrhythmia, lethargy. Sickle cell disease. EXAM: PORTABLE CHEST 1 VIEW COMPARISON:  Chest radiographs 0804 hours today and earlier. FINDINGS: Lower lung volumes. Stable cardiomegaly and mediastinal contours. Increased streaky lower lobe opacity in the medial lung bases greater on the left. No superimposed pneumothorax, pulmonary edema or definite effusion. Paucity of bowel gas. Stable visualized osseous structures. IMPRESSION: Lower lung volumes and increased bibasilar pulmonary opacity since this morning. Differential considerations include atelectasis, acute chest syndrome, aspiration. Electronically Signed   By: Odessa Fleming M.D.   On: 01/30/2020 22:31    Medications: Scheduled Meds: . enoxaparin (LOVENOX) injection  40 mg Subcutaneous Q24H  . folic acid   1 mg Oral Daily  . HYDROmorphone   Intravenous Q4H  . hydroxyurea  1,500 mg Oral Daily  . ketorolac  15 mg Intravenous Q6H  . losartan  50 mg Oral Daily  . senna-docusate  1 tablet Oral BID   Continuous Infusions: . dextrose 5 % and 0.45% NaCl 125 mL/hr at 01/31/20 0618  . diphenhydrAMINE     PRN Meds:.diphenhydrAMINE **OR** diphenhydrAMINE, ipratropium-albuterol, naloxone **AND** sodium chloride flush, ondansetron (ZOFRAN) IV, oxyCODONE, polyethylene glycol  Consultants:  None  Procedures:  None  Antibiotics:  None  Assessment/Plan: Principal Problem:   Sickle cell anemia with crisis (HCC) Active Problems:   Leukocytosis   Chronic pain syndrome  Sickle cell disease with pain crisis: Decrease IV fluids.  D5 0.45% saline at 75 mL/h Continue IV Dilaudid PCA, no changes in settings on today IV Toradol 15 mg every 6 hours for total of 5 days Monitor vital signs closely, reevaluate pain scale regularly  Acute hypoxic: Oxygen saturation 93% nonrebreather 15.  Chest x-ray showed low lung volumes and increased bibasilar pulmonary opacity.  Patient continues complain of shortness of breath and chest pain.  Review CT of chest as results become available  Leukocytosis: WBC is 18.5.  Patient is afebrile.  Urinalysis unremarkable.    Sickle cell anemia: Hemoglobin 9.3, consistent with patient's baseline.  No clinical indication for blood transfusion at this time.  Continue to follow closely.  Probable acute chest syndrome: Supplemental oxygen, incentive spirometer, continue to monitor hemoglobin, may warrant blood transfusion.  Follow CTA  Chronic pain syndrome: Continue home medications  Code Status: Full Code Family Communication: N/A Disposition Plan: Not yet ready for discharge   Monish Haliburton Rennis Petty  APRN, MSN, FNP-C [facility] [address] [place], [place] [address] [phone]  If 5PM-7AM, please contact  night-coverage.  01/31/2020, 10:14 AM  LOS: 1 day
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
## 2020-01-31 NOTE — Progress Notes (Signed)
Patient was transferred from 6E due to Sat, respirations, and HR. Will be on telemetry monitoring. Patient had yellow MEWs score on arrival, this is not an acute change, MD was notified on previous unit and rapid response and MD accessed patient at bedside. HR became elevated once when patient got up to use urinal and mentioned that he was straining to urinate. HR went back to normal when patient sat down, patient is currently being monitored and patient is stable. MEWs protocol is being followed. Marcelle Overlie, RN

## 2020-01-31 NOTE — Progress Notes (Signed)
Yellow MEWS score acute and MD aware

## 2020-02-01 DIAGNOSIS — R0602 Shortness of breath: Secondary | ICD-10-CM

## 2020-02-01 LAB — CBC
HCT: 25.2 % — ABNORMAL LOW (ref 39.0–52.0)
Hemoglobin: 9 g/dL — ABNORMAL LOW (ref 13.0–17.0)
MCH: 30.3 pg (ref 26.0–34.0)
MCHC: 35.7 g/dL (ref 30.0–36.0)
MCV: 84.8 fL (ref 80.0–100.0)
Platelets: 307 10*3/uL (ref 150–400)
RBC: 2.97 MIL/uL — ABNORMAL LOW (ref 4.22–5.81)
RDW: 18.8 % — ABNORMAL HIGH (ref 11.5–15.5)
WBC: 13 10*3/uL — ABNORMAL HIGH (ref 4.0–10.5)
nRBC: 3.3 % — ABNORMAL HIGH (ref 0.0–0.2)

## 2020-02-01 LAB — CREATININE, SERUM
Creatinine, Ser: 0.51 mg/dL — ABNORMAL LOW (ref 0.61–1.24)
GFR calc Af Amer: >60 mL/min (ref >60)
GFR calc non Af Amer: >60 mL/min (ref >60)

## 2020-02-01 MED ORDER — HYDROMORPHONE 1 MG/ML IV SOLN
INTRAVENOUS | Status: DC
  Administered 2020-02-01: 2 mg via INTRAVENOUS
  Administered 2020-02-01: 0.5 mg via INTRAVENOUS
  Administered 2020-02-01: 3.5 mg via INTRAVENOUS
  Administered 2020-02-02: 1.5 mg via INTRAVENOUS

## 2020-02-01 NOTE — Progress Notes (Signed)
Subjective: [name] Hill, a 19 year old male with a medical history significant for sickle cell disease type SS, chronic pain syndrome, opiate dependence and tolerance, history of mild intermittent asthma, and history of anemia of chronic disease was admitted for sickle cell pain crisis.  Patient continues to have increased oxygen needs.  Oxygen saturation 95% on NRB at 15 L.  Patient underwent CT angiogram, showed no demonstrable pulmonary embolism, no thoracic aortic aneurysm or dissection.  Mild cardiomegaly.  Multifocal pneumonia, most severe in the lower lobes bilaterally.  Also, prominence of the main pulmonary outflow tract, indicative of pulmonary arterial hypertension. Patient says pain has improved overnight.  Pain is primarily to mid chest, low back, and lower extremities.  Pain intensity is 5/10.  Patient denies headache, dizziness, paresthesias, nausea, vomiting, or diarrhea.  Objective:  Vital signs in last 24 hours:  Vitals:   02/01/20 1019 02/01/20 1148 02/01/20 1356 02/01/20 1622  BP: (!) 145/81  129/60   Pulse: 78  82   Resp: (!) 30 (!) 36 (!) 28 (!) 22  Temp: 99.7 F (37.6 C)  (!) 97.1 F (36.2 C)   TempSrc: Axillary  Axillary   SpO2: 100% 94% 100% 95%  Weight:      Height:        Intake/Output from previous day:   Intake/Output Summary (Last 24 hours) at 02/01/2020 1653 Last data filed at 02/01/2020 1626 Gross per 24 hour  Intake 1662.65 ml  Output 3020 ml  Net -1357.35 ml    Physical Exam: General: Alert, awake, oriented x3, in no acute distress.  HEENT: [place]/AT PEERL, EOMI Neck: Trachea midline,  no masses, no thyromegal,y no JVD, no carotid bruit OROPHARYNX:  Moist, No exudate/ erythema/lesions.  Heart: Regular rate and rhythm, without murmurs, rubs, gallops, PMI non-displaced, no heaves or thrills on palpation.  Lungs: Clear to auscultation, no wheezing or rhonchi noted. No increased vocal fremitus resonant to percussion  Abdomen: Soft, nontender,  nondistended, positive bowel sounds, no masses no hepatosplenomegaly noted..  Neuro: No focal neurological deficits noted cranial nerves II through XII grossly intact. DTRs 2+ bilaterally upper and lower extremities. Strength 5 out of 5 in bilateral upper and lower extremities. Musculoskeletal: No warm swelling or erythema around joints, no spinal tenderness noted. Psychiatric: Patient alert and oriented x3, good insight and cognition, good recent to remote recall. Lymph node survey: No cervical axillary or inguinal lymphadenopathy noted.  Lab Results:  Basic Metabolic Panel:    Component Value Date/Time   NA 136 01/31/2020 0523   NA 142 06/26/2018 1416   K 3.7 01/31/2020 0523   CL 105 01/31/2020 0523   CO2 23 01/31/2020 0523   BUN 9 01/31/2020 0523   BUN 7 06/26/2018 1416   CREATININE 0.51 (L) 02/01/2020 0434   GLUCOSE 130 (H) 01/31/2020 0523   CALCIUM 8.3 (L) 01/31/2020 0523   CBC:    Component Value Date/Time   WBC 13.0 (H) 02/01/2020 0433   HGB 9.0 (L) 02/01/2020 0433   HGB 9.6 (L) 06/26/2018 1416   HCT 25.2 (L) 02/01/2020 0433   HCT 28.5 (L) 06/26/2018 1416   PLT 307 02/01/2020 0433   PLT 405 06/26/2018 1416   MCV 84.8 02/01/2020 0433   MCV 92 06/26/2018 1416   NEUTROABS 12.8 (H) 01/31/2020 0523   NEUTROABS 3.7 06/26/2018 1416   LYMPHSABS 3.7 01/31/2020 0523   LYMPHSABS 3.3 (H) 06/26/2018 1416   MONOABS 1.8 (H) 01/31/2020 0523   EOSABS 0.0 01/31/2020 0523   EOSABS 0.2 06/26/2018  1416   BASOSABS 0.1 01/31/2020 0523   BASOSABS 0.1 06/26/2018 1416    Recent Results (from the past 240 hour(s))  SARS CORONAVIRUS 2 (TAT 6-24 HRS) Nasopharyngeal Nasopharyngeal Swab     Status: None   Collection Time: 01/30/20  1:38 PM   Specimen: Nasopharyngeal Swab  Result Value Ref Range Status   SARS Coronavirus 2 NEGATIVE NEGATIVE Final    Comment: (NOTE) SARS-CoV-2 target nucleic acids are NOT DETECTED. The SARS-CoV-2 RNA is generally detectable in upper and lower respiratory  specimens during the acute phase of infection. Negative results do not preclude SARS-CoV-2 infection, do not rule out co-infections with other pathogens, and should not be used as the sole basis for treatment or other patient management decisions. Negative results must be combined with clinical observations, patient history, and epidemiological information. The expected result is Negative. Fact Sheet for Patients: HairSlick.no Fact Sheet for Healthcare Providers: quierodirigir.com This test is not yet approved or cleared by the [place] FDA and  has been authorized for detection and/or diagnosis of SARS-CoV-2 by FDA under an Emergency Use Authorization (EUA). This EUA will remain  in effect (meaning this test can be used) for the duration of the COVID-19 declaration under Section 56 4(b)(1) of the Act, 21 U.S.C. section 360bbb-3(b)(1), unless the authorization is terminated or revoked sooner. Performed at [facility], [address] [place], [place] [address]   MRSA PCR Screening     Status: None   Collection Time: 01/31/20  1:03 PM   Specimen: Nasal Mucosa; Nasopharyngeal  Result Value Ref Range Status   MRSA by PCR NEGATIVE NEGATIVE Final    Comment:        The GeneXpert MRSA Assay (FDA approved for NASAL specimens only), is one component of a comprehensive MRSA colonization surveillance program. It is not intended to diagnose MRSA infection nor to guide or monitor treatment for MRSA infections. Performed at [facility], [address], [place], [place] [address]     Studies/Results: CT ANGIO CHEST PE W OR WO CONTRAST  Result Date: 01/31/2020 CLINICAL DATA:  Shortness of breath/hypoxia.  Sickle cell disease EXAM: CT ANGIOGRAPHY CHEST WITH CONTRAST TECHNIQUE: Multidetector CT imaging of the chest was performed using the standard protocol during bolus administration of intravenous contrast.  Multiplanar CT image reconstructions and MIPs were obtained to evaluate the vascular anatomy. CONTRAST:  OMNIPAQUE IOHEXOL 350 MG/ML SOLN COMPARISON:  Chest radiograph January 30, 2020; CT angiogram chest November 20, 2019 FINDINGS: Cardiovascular: There is no demonstrable pulmonary embolus. There is no thoracic aortic aneurysm or dissection. Visualized great vessels appear unremarkable. Heart is mildly enlarged. There is no pericardial effusion or pericardial thickening. The main pulmonary outflow tract measures 3.3 cm in diameter, prominent. Mediastinum/Nodes: Visualized thyroid appears normal. No evident thoracic adenopathy. There are a few calcifications in subcarinal lymph nodes which may indicate prior granulomatous disease. No esophageal lesions are appreciable. Lungs/Pleura: There is airspace consolidation in each lower lobe. There is also airspace consolidation in a portion of the posterior segment of the left upper lobe. Mild patchy infiltrate is noted in the inferior lingula. No appreciable pleural effusions. Upper Abdomen: Spleen appears inhomogeneous with diffuse increased attenuation consistent with a degree of auto infarction from known sickle cell disease. Visualized upper abdominal structures otherwise appear unremarkable. Musculoskeletal: Bones are diffusely sclerotic consistent with known sickle cell disease. There are multiple endplate infarcts throughout multiple thoracic vertebral bodies. Review of the MIP images confirms the above findings. IMPRESSION: 1.  No demonstrable pulmonary embolus. No thoracic aortic aneurysm or dissection. Mild cardiomegaly present. 2. Multifocal pneumonia, most severe in the lower lobes bilaterally. 3. Prominence of the main pulmonary outflow tract, a finding indicative of pulmonary arterial hypertension. 4.  No appreciable adenopathy. 5. Bony changes indicative of sickle cell disease. Appearance of spleen also indicative of sickle cell disease. Electronically  Signed   By: Lowella Grip III M.D.   On: 01/31/2020 12:29   DG CHEST PORT 1 VIEW  Result Date: 01/30/2020 CLINICAL DATA:  19 year old male with cardiac arrhythmia, lethargy. Sickle cell disease. EXAM: PORTABLE CHEST 1 VIEW COMPARISON:  Chest radiographs 0804 hours today and earlier. FINDINGS: Lower lung volumes. Stable cardiomegaly and mediastinal contours. Increased streaky lower lobe opacity in the medial lung bases greater on the left. No superimposed pneumothorax, pulmonary edema or definite effusion. Paucity of bowel gas. Stable visualized osseous structures. IMPRESSION: Lower lung volumes and increased bibasilar pulmonary opacity since this morning. Differential considerations include atelectasis, acute chest syndrome, aspiration. Electronically Signed   By: Genevie Ann M.D.   On: 01/30/2020 22:31    Medications: Scheduled Meds: . enoxaparin (LOVENOX) injection  40 mg Subcutaneous Q24H  . folic acid  1 mg Oral Daily  . HYDROmorphone   Intravenous Q4H  . hydroxyurea  1,500 mg Oral Daily  . ketorolac  15 mg Intravenous Q6H  . losartan  50 mg Oral Daily  . senna-docusate  1 tablet Oral BID   Continuous Infusions: . ceFEPime (MAXIPIME) IV 2 g (02/01/20 1407)  . dextrose 5 % and 0.45% NaCl 20 mL/hr at 02/01/20 0725  . diphenhydrAMINE     PRN Meds:.diphenhydrAMINE **OR** diphenhydrAMINE, ipratropium-albuterol, naloxone **AND** sodium chloride flush, ondansetron (ZOFRAN) IV, oxyCODONE, polyethylene glycol  Consultants:  None  Procedures:  None  Antibiotics:  IV cefepime per pharmacy consult  Assessment/Plan: Principal Problem:   Sickle cell anemia with crisis (HCC) Active Problems:   Leukocytosis   Chronic pain syndrome  Sickle cell pain crisis: Continue IV fluids at [place] IV Dilaudid PCA, decreased settings IV Toradol 15 mg every 6 hours for total of 5 days. Monitor vital signs closely, reevaluate pain scale regularly.  Acute hypoxia: Oxygen saturation 95% on  nonrebreather at 15 L.  CT of chest shows multifocal pneumonia primarily in the lower lobes.  Continue IV antibiotics.  Incentive spirometer.  Maintain oxygen saturation above 90.  Continue to wean oxygen as tolerated.  Leukocytosis: WBCs decreased to 13.0.  Patient afebrile.  Continue IV antibiotics.  Repeat CBC in a.m.  Sickle cell anemia: Hemoglobin 9.0, consistent with patient's baseline.  No clinical indication for blood transfusion at this time.  Continue to follow closely.   CAP vs HCAP Chest CT shows multifocal pneumonia.  Continue IV cefepime per pharmacy consult.  IV vancomycin discontinued.  MRSA by PCR negative.  COVID-19 negative.  Continue supplemental oxygen  Chronic pain syndrome: Continue home medications    Code Status: Full Code Family Communication: N/A Disposition Plan: Not yet ready for discharge  [place], MSN, FNP-C Patient [place] [address] [place], [place] [address] [phone]  If 5PM-7AM, please contact night-coverage.  02/01/2020, 4:53 PM  LOS: 2 days

## 2020-02-02 DIAGNOSIS — J189 Pneumonia, unspecified organism: Secondary | ICD-10-CM

## 2020-02-02 DIAGNOSIS — Y95 Nosocomial condition: Secondary | ICD-10-CM

## 2020-02-02 LAB — CREATININE, SERUM
Creatinine, Ser: 0.51 mg/dL — ABNORMAL LOW (ref 0.61–1.24)
GFR calc Af Amer: >60 mL/min (ref >60)
GFR calc non Af Amer: >60 mL/min (ref >60)

## 2020-02-02 MED ORDER — CYCLOBENZAPRINE HCL 5 MG PO TABS: 5.0000 mg | ORAL_TABLET | Freq: Three times a day (TID) | ORAL | Status: DC | PRN

## 2020-02-02 MED ORDER — HYDROMORPHONE 1 MG/ML IV SOLN
INTRAVENOUS | Status: DC
  Administered 2020-02-02: 1.6 mg via INTRAVENOUS
  Administered 2020-02-02: 0.3 mg via INTRAVENOUS
  Administered 2020-02-03: 0.6 mL via INTRAVENOUS
  Administered 2020-02-03: 0.6 mg via INTRAVENOUS
  Administered 2020-02-03: 1.5 mg via INTRAVENOUS

## 2020-02-02 NOTE — Progress Notes (Addendum)
Subjective: [name] Hill, a 19 year old male with a medical history significant for sickle cell disease type SS, chronic pain syndrome, opiate dependence and tolerance, history of mild intermittent asthma, and history of anemia of chronic disease was admitted for sickle cell pain crisis.  Currently, patient's oxygen saturation is 98% on 3 L, which is much improved.  Patient continues to complain of pain merrily to low back.  He also endorses some back spasms. He denies headache, dizziness, paresthesias, nausea, vomiting, or diarrhea. Objective:  Vital signs in last 24 hours:  Vitals:   02/02/20 0925 02/02/20 0929 02/02/20 1310 02/02/20 1420  BP:  135/72  136/74  Pulse:  86  82  Resp: 20 (!) 25 (!) 21 14  Temp:  98.6 F (37 C)  99.3 F (37.4 C)  TempSrc:    Oral  SpO2: 97% 97% 98% 98%  Weight:      Height:        Intake/Output from previous day:   Intake/Output Summary (Last 24 hours) at 02/02/2020 1510 Last data filed at 02/02/2020 4193 Gross per 24 hour  Intake 857.91 ml  Output 1600 ml  Net -742.09 ml    Physical Exam: General: Alert, awake, oriented x3, in no acute distress.  HEENT: [place]/AT PEERL, EOMI Neck: Trachea midline,  no masses, no thyromegal,y no JVD, no carotid bruit OROPHARYNX:  Moist, No exudate/ erythema/lesions.  Heart: Regular rate and rhythm, without murmurs, rubs, gallops, PMI non-displaced, no heaves or thrills on palpation.  Lungs: Clear to auscultation, no wheezing or rhonchi noted. No increased vocal fremitus resonant to percussion  Abdomen: Soft, nontender, nondistended, positive bowel sounds, no masses no hepatosplenomegaly noted..  Neuro: No focal neurological deficits noted cranial nerves II through XII grossly intact. DTRs 2+ bilaterally upper and lower extremities. Strength 5 out of 5 in bilateral upper and lower extremities. Musculoskeletal: No warm swelling or erythema around joints, no spinal tenderness noted. Psychiatric: Patient alert and  oriented x3, good insight and cognition, good recent to remote recall. Lymph node survey: No cervical axillary or inguinal lymphadenopathy noted.  Lab Results:  Basic Metabolic Panel:    Component Value Date/Time   NA 136 01/31/2020 0523   NA 142 06/26/2018 1416   K 3.7 01/31/2020 0523   CL 105 01/31/2020 0523   CO2 23 01/31/2020 0523   BUN 9 01/31/2020 0523   BUN 7 06/26/2018 1416   CREATININE 0.51 (L) 02/02/2020 0519   GLUCOSE 130 (H) 01/31/2020 0523   CALCIUM 8.3 (L) 01/31/2020 0523   CBC:    Component Value Date/Time   WBC 13.0 (H) 02/01/2020 0433   HGB 9.0 (L) 02/01/2020 0433   HGB 9.6 (L) 06/26/2018 1416   HCT 25.2 (L) 02/01/2020 0433   HCT 28.5 (L) 06/26/2018 1416   PLT 307 02/01/2020 0433   PLT 405 06/26/2018 1416   MCV 84.8 02/01/2020 0433   MCV 92 06/26/2018 1416   NEUTROABS 12.8 (H) 01/31/2020 0523   NEUTROABS 3.7 06/26/2018 1416   LYMPHSABS 3.7 01/31/2020 0523   LYMPHSABS 3.3 (H) 06/26/2018 1416   MONOABS 1.8 (H) 01/31/2020 0523   EOSABS 0.0 01/31/2020 0523   EOSABS 0.2 06/26/2018 1416   BASOSABS 0.1 01/31/2020 0523   BASOSABS 0.1 06/26/2018 1416    Recent Results (from the past 240 hour(s))  SARS CORONAVIRUS 2 (TAT 6-24 HRS) Nasopharyngeal Nasopharyngeal Swab     Status: None   Collection Time: 01/30/20  1:38 PM   Specimen: Nasopharyngeal Swab  Result Value Ref Range  Status   SARS Coronavirus 2 NEGATIVE NEGATIVE Final    Comment: (NOTE) SARS-CoV-2 target nucleic acids are NOT DETECTED. The SARS-CoV-2 RNA is generally detectable in upper and lower respiratory specimens during the acute phase of infection. Negative results do not preclude SARS-CoV-2 infection, do not rule out co-infections with other pathogens, and should not be used as the sole basis for treatment or other patient management decisions. Negative results must be combined with clinical observations, patient history, and epidemiological information. The expected result is  Negative. Fact Sheet for Patients: HairSlick.no Fact Sheet for Healthcare Providers: quierodirigir.com This test is not yet approved or cleared by the [place] FDA and  has been authorized for detection and/or diagnosis of SARS-CoV-2 by FDA under an Emergency Use Authorization (EUA). This EUA will remain  in effect (meaning this test can be used) for the duration of the COVID-19 declaration under Section 56 4(b)(1) of the Act, 21 U.S.C. section 360bbb-3(b)(1), unless the authorization is terminated or revoked sooner. Performed at [facility], [address], [place], [place] [address]   MRSA PCR Screening     Status: None   Collection Time: 01/31/20  1:03 PM   Specimen: Nasal Mucosa; Nasopharyngeal  Result Value Ref Range Status   MRSA by PCR NEGATIVE NEGATIVE Final    Comment:        The GeneXpert MRSA Assay (FDA approved for NASAL specimens only), is one component of a comprehensive MRSA colonization surveillance program. It is not intended to diagnose MRSA infection nor to guide or monitor treatment for MRSA infections. Performed at [facility], [address], [place], [place] [address]     Studies/Results: No results found.  Medications: Scheduled Meds: . enoxaparin (LOVENOX) injection  40 mg Subcutaneous Q24H  . folic acid  1 mg Oral Daily  . HYDROmorphone   Intravenous Q4H  . hydroxyurea  1,500 mg Oral Daily  . ketorolac  15 mg Intravenous Q6H  . losartan  50 mg Oral Daily  . senna-docusate  1 tablet Oral BID   Continuous Infusions: . ceFEPime (MAXIPIME) IV 2 g (02/02/20 1508)  . dextrose 5 % and 0.45% NaCl 20 mL/hr at 02/01/20 0725  . diphenhydrAMINE     PRN Meds:.cyclobenzaprine, diphenhydrAMINE **OR** diphenhydrAMINE, ipratropium-albuterol, naloxone **AND** sodium chloride flush, ondansetron (ZOFRAN) IV, oxyCODONE, polyethylene  glycol  Consultants:  None  Procedures:  None  Antibiotics:  IV cefepime  Assessment/Plan: Principal Problem:   Sickle cell anemia with crisis (HCC) Active Problems:   Leukocytosis   HAP (hospital-acquired pneumonia)   Chronic pain syndrome   SOB (shortness of breath)  Sickle cell disease with pain crisis: Continue IV fluids at [facility] Weaning IV Dilaudid PCA IV Toradol 15 mg every 6 hours for total of 5 days.  Monitor vital signs closely, reevaluate pain scale regularly.  Hypoxia: Oxygen saturation improved.  Saturation 98% on 3 L supplemental oxygen.  Continue to titrate. Ambulating pulse ox today. Continue incentive spirometer.  Maintain oxygen saturation above 90.  Leukocytosis: WBCs improving.  Patient afebrile.  Continue IV antibiotics.  Repeat CBC in a.m.  CAP vs HCAP Continue IV cefepime per pharmacy consult.  Will consider transitioning to oral antibiotics on tomorrow.  Chronic pain syndrome: Continue home medications  Code Status: Full Code Family Communication: N/A Disposition Plan: Not yet ready for discharge  Nolon Nations  APRN, MSN, FNP-C [facility] [address] [place], [place] [address] [phone]  If 5PM-8AM,  please contact night-coverage.  02/02/2020, 3:10 PM  LOS: 3 days

## 2020-02-02 NOTE — Progress Notes (Signed)
Assumed care of patient from off going RN.No changes initial am assessment at this time. Cont with plan of care. Pain controlled at this time with PCA

## 2020-02-03 LAB — CREATININE, SERUM
Creatinine, Ser: 0.49 mg/dL — ABNORMAL LOW (ref 0.61–1.24)
GFR calc Af Amer: >60 mL/min (ref >60)
GFR calc non Af Amer: >60 mL/min (ref >60)

## 2020-02-03 LAB — CBC
HCT: 24.9 % — ABNORMAL LOW (ref 39.0–52.0)
Hemoglobin: 8.8 g/dL — ABNORMAL LOW (ref 13.0–17.0)
MCH: 29.8 pg (ref 26.0–34.0)
MCHC: 35.3 g/dL (ref 30.0–36.0)
MCV: 84.4 fL (ref 80.0–100.0)
Platelets: 379 10*3/uL (ref 150–400)
RBC: 2.95 MIL/uL — ABNORMAL LOW (ref 4.22–5.81)
RDW: 18.9 % — ABNORMAL HIGH (ref 11.5–15.5)
WBC: 10.3 10*3/uL (ref 4.0–10.5)
nRBC: 0.9 % — ABNORMAL HIGH (ref 0.0–0.2)

## 2020-02-03 MED ORDER — AMOXICILLIN-POT CLAVULANATE 875-125 MG PO TABS
1.0000 | ORAL_TABLET | Freq: Two times a day (BID) | ORAL | Status: DC
  Administered 2020-02-03 – 2020-02-04 (×3): 1 via ORAL
  Filled 2020-02-03 (×3): qty 1

## 2020-02-03 MED ORDER — OXYCODONE HCL 5 MG PO TABS
10.0000 mg | ORAL_TABLET | ORAL | Status: DC
  Administered 2020-02-03 – 2020-02-04 (×6): 10 mg via ORAL
  Filled 2020-02-03 (×6): qty 2

## 2020-02-03 NOTE — Progress Notes (Signed)
Subjective: [name] Hill, a 19 year old male with a medical history significant for sickle cell disease type SS, chronic pain syndrome, opiate dependence and tolerance, history of mild intermittent asthma, and history of anemia of chronic disease was admitted for sickle cell pain crisis.  Patient continues to maintain oxygen saturation above 90 on 3 L.  Pulse oximetry was performed while ambulating on yesterday, oxygen saturation dropped in the low 80s on RA.Marland Kitchen  Patient has some pain to low back, characterizes it as mild.  Pain intensity is 4/10.  Patient denies headache, shortness of breath, chest pain, urinary symptoms, nausea, vomiting, or diarrhea.  Objective:  Vital signs in last 24 hours:  Vitals:   02/03/20 0011 02/03/20 0403 02/03/20 0515 02/03/20 1022  BP: 129/71  130/78   Pulse: 87  78   Resp: 20 18 20 20   Temp: 99.4 F (37.4 C)  98.9 F (37.2 C)   TempSrc: Oral  Oral   SpO2: 96% 96% 96% 99%  Weight:      Height:        Intake/Output from previous day:   Intake/Output Summary (Last 24 hours) at 02/03/2020 1105 Last data filed at 02/03/2020 0600 Gross per 24 hour  Intake 1680.89 ml  Output 1075 ml  Net 605.89 ml    Physical Exam: General: Alert, awake, oriented x3, in no acute distress.  HEENT: [place]/AT PEERL, EOMI Neck: Trachea midline,  no masses, no thyromegal,y no JVD, no carotid bruit OROPHARYNX:  Moist, No exudate/ erythema/lesions.  Heart: Regular rate and rhythm, without murmurs, rubs, gallops, PMI non-displaced, no heaves or thrills on palpation.  Lungs: Clear to auscultation, no wheezing or rhonchi noted. No increased vocal fremitus resonant to percussion  Abdomen: Soft, nontender, nondistended, positive bowel sounds, no masses no hepatosplenomegaly noted..  Neuro: No focal neurological deficits noted cranial nerves II through XII grossly intact. DTRs 2+ bilaterally upper and lower extremities. Strength 5 out of 5 in bilateral upper and lower  extremities. Musculoskeletal: No warm swelling or erythema around joints, no spinal tenderness noted. Psychiatric: Patient alert and oriented x3, good insight and cognition, good recent to remote recall. Lymph node survey: No cervical axillary or inguinal lymphadenopathy noted.  Lab Results:  Basic Metabolic Panel:    Component Value Date/Time   NA 136 01/31/2020 0523   NA 142 06/26/2018 1416   K 3.7 01/31/2020 0523   CL 105 01/31/2020 0523   CO2 23 01/31/2020 0523   BUN 9 01/31/2020 0523   BUN 7 06/26/2018 1416   CREATININE 0.49 (L) 02/03/2020 0437   GLUCOSE 130 (H) 01/31/2020 0523   CALCIUM 8.3 (L) 01/31/2020 0523   CBC:    Component Value Date/Time   WBC 10.3 02/03/2020 0437   HGB 8.8 (L) 02/03/2020 0437   HGB 9.6 (L) 06/26/2018 1416   HCT 24.9 (L) 02/03/2020 0437   HCT 28.5 (L) 06/26/2018 1416   PLT 379 02/03/2020 0437   PLT 405 06/26/2018 1416   MCV 84.4 02/03/2020 0437   MCV 92 06/26/2018 1416   NEUTROABS 12.8 (H) 01/31/2020 0523   NEUTROABS 3.7 06/26/2018 1416   LYMPHSABS 3.7 01/31/2020 0523   LYMPHSABS 3.3 (H) 06/26/2018 1416   MONOABS 1.8 (H) 01/31/2020 0523   EOSABS 0.0 01/31/2020 0523   EOSABS 0.2 06/26/2018 1416   BASOSABS 0.1 01/31/2020 0523   BASOSABS 0.1 06/26/2018 1416    Recent Results (from the past 240 hour(s))  SARS CORONAVIRUS 2 (TAT 6-24 HRS) Nasopharyngeal Nasopharyngeal Swab     Status:  None   Collection Time: 01/30/20  1:38 PM   Specimen: Nasopharyngeal Swab  Result Value Ref Range Status   SARS Coronavirus 2 NEGATIVE NEGATIVE Final    Comment: (NOTE) SARS-CoV-2 target nucleic acids are NOT DETECTED. The SARS-CoV-2 RNA is generally detectable in upper and lower respiratory specimens during the acute phase of infection. Negative results do not preclude SARS-CoV-2 infection, do not rule out co-infections with other pathogens, and should not be used as the sole basis for treatment or other patient management decisions. Negative results  must be combined with clinical observations, patient history, and epidemiological information. The expected result is Negative. Fact Sheet for Patients: SugarRoll.be Fact Sheet for Healthcare Providers: https://www.woods-mathews.com/ This test is not yet approved or cleared by the [place] FDA and  has been authorized for detection and/or diagnosis of SARS-CoV-2 by FDA under an Emergency Use Authorization (EUA). This EUA will remain  in effect (meaning this test can be used) for the duration of the COVID-19 declaration under Section 56 4(b)(1) of the Act, 21 U.S.C. section 360bbb-3(b)(1), unless the authorization is terminated or revoked sooner. Performed at [facility], [place] [address] [place], [place] [address]   MRSA PCR Screening     Status: None   Collection Time: 01/31/20  1:03 PM   Specimen: Nasal Mucosa; Nasopharyngeal  Result Value Ref Range Status   MRSA by PCR NEGATIVE NEGATIVE Final    Comment:        The GeneXpert MRSA Assay (FDA approved for NASAL specimens only), is one component of a comprehensive MRSA colonization surveillance program. It is not intended to diagnose MRSA infection nor to guide or monitor treatment for MRSA infections. Performed at [facility], [place] [address] [place], [place] [address]     Studies/Results: No results found.  Medications: Scheduled Meds: . amoxicillin-clavulanate  1 tablet Oral Q12H  . enoxaparin (LOVENOX) injection  40 mg Subcutaneous Q24H  . folic acid  1 mg Oral Daily  . hydroxyurea  1,500 mg Oral Daily  . ketorolac  15 mg Intravenous Q6H  . losartan  50 mg Oral Daily  . oxyCODONE  10 mg Oral Q4H while awake  . senna-docusate  1 tablet Oral BID   Continuous Infusions: PRN Meds:.cyclobenzaprine, ipratropium-albuterol, polyethylene glycol  Consultants:  None  Procedures:  None  Antibiotics:  None  Assessment/Plan: Principal  Problem:   Sickle cell anemia with crisis (HCC) Active Problems:   Leukocytosis   HAP (hospital-acquired pneumonia)   Chronic pain syndrome   SOB (shortness of breath)  Sickle cell disease with pain crisis: Discontinue IV fluids and IV Dilaudid PCA.  Oxycodone 10 mg every 4 hours while awake.  IV Toradol 15 mg every 6 hours for total of 5 days.  Monitor vital signs closely and reevaluate pain scale regularly.  Hypoxia: Patient maintaining oxygen saturation above 90 on 3 L supplemental oxygen.  Patient underwent pulse oximetry while ambulating on yesterday, oxygen saturation decreased to low 80s on room air. Continue to titrate as tolerated.  Repeat ambulating pulse ox on today.  Continue incentive spirometer.  Maintain oxygen saturation above 90.  Patient may warrant home oxygen.  Leukocytosis: WBC stable.  Patient continues to be afebrile.  Discontinue IV antibiotics.  Initiate Augmentin 875-125 mg every 12 hours.  CAP vs HCAP: Continue antibiotics.  Incentive spirometer encouraged.  Chronic pain syndrome: Continue home medications  Code Status: Full Code Family Communication: N/A Disposition Plan: Not yet ready for discharge  If 5PM-7AM, please contact night-coverage.  02/03/2020, 11:05 AM  LOS: 4 days

## 2020-02-03 NOTE — Progress Notes (Signed)
SATURATION QUALIFICATIONS: (This note is used to comply with regulatory documentation for home oxygen)  Patient Saturations on Room Air at Rest = 98%  Patient Saturations on Room Air while Ambulating = 84%  Patient Saturations on 3 Liters of oxygen while Ambulating = 93%  Please briefly explain why patient needs home oxygen:  Patient oxygen saturation drops to 84% on ambulation.

## 2020-02-03 NOTE — Progress Notes (Signed)
Pharmacy Antibiotic Note  [name] Hill is a 19 y.o. male admitted on 01/30/2020 with HAP.  Pharmacy has been consulted for cefepime dosing.  Today, 02/03/20  WBC WNL  SCr - stable, WNL. CrCl > 60 mL/min  Afebrile  Day #4 of IV antibiotics.   Plan:  Continue cefepime 2 g IV q8h  Height: 5\' 8"  (172.7 cm) Weight: 210 lb 8.6 oz (95.5 kg) IBW/kg (Calculated) : 68.4  Temp (24hrs), Avg:99 F (37.2 C), Min:98.6 F (37 C), Max:99.4 F (37.4 C)  Recent Labs  Lab 01/30/20 0739 01/30/20 0739 01/30/20 2229 01/31/20 0523 02/01/20 0433 02/01/20 0434 02/02/20 0519 02/03/20 0437  WBC 13.5*  --  20.3* 18.4* 13.0*  --   --  10.3  CREATININE 0.57*   < > 0.57* 0.60*  --  0.51* 0.51* 0.49*   < > = values in this interval not displayed.    Estimated Creatinine Clearance: 165 mL/min (A) (by C-G formula based on SCr of 0.49 mg/dL (L)).    Allergies  Allergen Reactions  . Other Cough    Seasonal allergies.  . Lisinopril Cough    04/04/20, PharmD 02/03/2020 9:26 AM

## 2020-02-04 MED ORDER — ALBUTEROL SULFATE HFA 108 (90 BASE) MCG/ACT IN AERS: 2.0000 | INHALATION_SPRAY | Freq: Four times a day (QID) | RESPIRATORY_TRACT | 0 refills | Status: DC | PRN

## 2020-02-04 MED ORDER — FOLIC ACID 1 MG PO TABS: 1.0000 mg | ORAL_TABLET | Freq: Every day | ORAL | 11 refills | Status: DC

## 2020-02-04 MED ORDER — AMOXICILLIN-POT CLAVULANATE 875-125 MG PO TABS: 1.0000 | ORAL_TABLET | Freq: Two times a day (BID) | ORAL | 0 refills | Status: DC

## 2020-02-04 NOTE — Discharge Summary (Signed)
Physician Discharge Summary  [name] Hill ZOX:[id number] DOB: [birth year] DOA: 01/30/2020  PCP: Kallie LocksStroud, [name] M, FNP  Admit date: 01/30/2020  Discharge date: 02/04/2020  Discharge Diagnoses:  Principal Problem:   Sickle cell anemia with crisis (HCC) Active Problems:   Leukocytosis   HAP (hospital-acquired pneumonia)   Chronic pain syndrome   SOB (shortness of breath)   Discharge Condition: Stable  Disposition:  Follow-up Information    Kallie LocksStroud, [name] M, FNP Follow up in 1 week(s).   Specialty: Family Medicine Why: 6 minute walk test and reassess oxygen needs Contact information: [address] [place] [address] [phone]          Pt is discharged home in good condition and is to follow up with Kallie LocksStroud, [name] M, FNP  In 1 week to have labs evaluated and assess oxygen needs.   [name] Hill is instructed to increase activity slowly and balance with rest for the next few days, and use prescribed medication to complete treatment of pain  Diet: Regular Wt Readings from Last 3 Encounters:  01/30/20 95.5 kg  12/31/19 94 kg  12/01/19 90.8 kg    History of present illness:  [name] ObeyKaileb Hill, is a 19 year old male with a medical history significant for sickle cell disease, multiple episodes of acute chest syndrome, airway hyperreactivity diagnosed as asthma, sickle cell nephropathy with proteinuria who presented in the ER today with major complaints of pain to back and chest area, which started early this morning.  He characterizes the pain as achy, throbbing, and constant.  He took his home medications of hydrocodone 10 mg this a.m. with no sustained relief.  His last admission was December 2020 for sickle cell pain crisis.  ER course: Patient was found to be saturating around 90% on RA when he arrived to the ER.  Improved to 95% on 2 L of oxygen.  He was found to be uncomfortable, endorsing chest pain.  CMP largely unremarkable except for slightly elevated AST, CBC  with mild leukocytosis.  Chest x-ray showed no acute changes.  He was hemodynamically stable, but after repeated doses of Dilaudid IV, there was no significant improvement for pain.  Patient will be admitted to hospital for further evaluation and pain management. Hospital Course:   Multifocal pneumonia in the setting of sickle cell pain crisis: Patient noted to have acute hypoxia.  Patient's oxygen needs continue to increase.  Increased to a maximum of 15 L on NRB.  Patient titrated down to 3 L.  On ambulating pulse oximetry, oxygen saturation decreased below 90% when ambulating on room air.  Also, patient's oxygen saturation drops below 90 at rest on RA.  Patient will discharge home on 3 L of supplemental oxygen.  He will follow-up with PCP in 1 week.  Patient underwent CT angio of chest on 01/31/2020.  Showed multifocal pneumonia, most severe in the lower lobes bilaterally.  There was no demonstrable pulmonary embolism at that time. IV cefepime and vancomycin were initiated.  Patient eventually transition to Augmentin 875-125 mg every 12 hours.  Patient will continue Augmentin for an additional 6 days to complete treatment.  Also, continuation of incentive spirometer encouraged.  Sickle cell disease with pain crisis: Patient was admitted for sickle cell pain crisis and managed appropriately with IVF, IV Dilaudid via PCA and IV Toradol, as well as other adjunct therapies per sickle cell pain management protocols.  Patient was transitioned to home medication of oxycodone 10 mg every 4 hours as needed for severe breakthrough  pain.  Patient will resume all home medications. Follow-up with PCP in 1 week to repeat labs.  Hemoglobin at discharge is 8.8.  Leukocytosis resolved.  Patient is aware of all upcoming appointments.  He is alert, oriented, and ambulating without assistance.  He is currently afebrile.  Patient was discharged home today in a hemodynamically stable condition.   Discharge Exam: Vitals:    02/04/20 0039 02/04/20 0501  BP: 117/68 109/60  Pulse: 91 82  Resp: 20 20  Temp: 98.7 F (37.1 C) 98.2 F (36.8 C)  SpO2: 97% (!) 89%   Vitals:   02/03/20 1836 02/03/20 2140 02/04/20 0039 02/04/20 0501  BP: (!) 144/77 (!) 101/56 117/68 109/60  Pulse: 96 79 91 82  Resp: 18 20 20 20   Temp: 99.8 F (37.7 C) 98.6 F (37 C) 98.7 F (37.1 C) 98.2 F (36.8 C)  TempSrc: Oral     SpO2: 96% 97% 97% (!) 89%  Weight:      Height:        General appearance : Awake, alert, not in any distress. Speech Clear. Not toxic looking HEENT: Atraumatic and Normocephalic, pupils equally reactive to light and accomodation Neck: Supple, no JVD. No cervical lymphadenopathy.  Chest: Good air entry bilaterally, no added sounds  CVS: S1 S2 regular, no murmurs.  Abdomen: Bowel sounds present, Non tender and not distended with no gaurding, rigidity or rebound. Extremities: B/L Lower Ext shows no edema, both legs are warm to touch Neurology: Awake alert, and oriented X 3, CN II-XII intact, Non focal Skin: No Rash  Discharge Instructions  Discharge Instructions    Discharge patient   Complete by: As directed    Discharge disposition: 01-Home or Self Care   Discharge patient date: 02/04/2020     Allergies as of 02/04/2020      Reactions   Other Cough   Seasonal allergies.   Lisinopril Cough      Medication List    TAKE these medications   acetaminophen 500 MG tablet Commonly known as: TYLENOL Take 1,000 mg by mouth daily as needed (severe pain).   albuterol 108 (90 Base) MCG/ACT inhaler Commonly known as: VENTOLIN HFA Inhale 2 puffs into the lungs every 4 (four) hours as needed for wheezing or shortness of breath. What changed: Another medication with the same name was added. Make sure you understand how and when to take each.   albuterol 108 (90 Base) MCG/ACT inhaler Commonly known as: VENTOLIN HFA Inhale 2 puffs into the lungs every 6 (six) hours as needed for wheezing or shortness of  breath. What changed: You were already taking a medication with the same name, and this prescription was added. Make sure you understand how and when to take each.   diphenhydrAMINE 25 mg capsule Commonly known as: Diphenhist Take 1 capsule (25 mg total) by mouth every 6 (six) hours as needed. What changed: reasons to take this   folic acid 1 MG tablet Commonly known as: FOLVITE Take 1 tablet (1 mg total) by mouth daily.   hydroxyurea 400 MG capsule Commonly known as: DROXIA Take 4 capsules (1,600 mg total) by mouth daily.   ibuprofen 800 MG tablet Commonly known as: ADVIL Take 1 tablet (800 mg total) by mouth every 8 (eight) hours as needed.   losartan 50 MG tablet Commonly known as: COZAAR Take 50 mg by mouth daily.   Oxycodone HCl 10 MG Tabs Take 1 tablet (10 mg total) by mouth every 4 (four) hours as needed (  pain).            Durable Medical Equipment  (From admission, onward)         Start     Ordered   02/04/20 1134  For home use only DME oxygen  Once    Question Answer Comment  Length of Need 6 Months   Mode or (Route) Nasal cannula   Liters per Minute 2   Frequency Continuous (stationary and portable oxygen unit needed)   Oxygen conserving device No   Oxygen delivery system Gas      02/04/20 1134          The results of significant diagnostics from this hospitalization (including imaging, microbiology, ancillary and laboratory) are listed below for reference.    Significant Diagnostic Studies: DG Chest 2 View  Result Date: 01/30/2020 CLINICAL DATA:  Chest pain EXAM: CHEST - 2 VIEW COMPARISON:  11/20/2019 FINDINGS: Mild cardiomegaly. Both lungs are clear. No pleural effusion or pneumothorax. Morphologic sequelae of sickle cell anemia noted within the spine. IMPRESSION: No active cardiopulmonary disease. Electronically Signed   By: Guadlupe Spanish M.D.   On: 01/30/2020 08:16   CT ANGIO CHEST PE W OR WO CONTRAST  Result Date: 01/31/2020 CLINICAL DATA:   Shortness of breath/hypoxia.  Sickle cell disease EXAM: CT ANGIOGRAPHY CHEST WITH CONTRAST TECHNIQUE: Multidetector CT imaging of the chest was performed using the standard protocol during bolus administration of intravenous contrast. Multiplanar CT image reconstructions and MIPs were obtained to evaluate the vascular anatomy. CONTRAST:  OMNIPAQUE IOHEXOL 350 MG/ML SOLN COMPARISON:  Chest radiograph January 30, 2020; CT angiogram chest November 20, 2019 FINDINGS: Cardiovascular: There is no demonstrable pulmonary embolus. There is no thoracic aortic aneurysm or dissection. Visualized great vessels appear unremarkable. Heart is mildly enlarged. There is no pericardial effusion or pericardial thickening. The main pulmonary outflow tract measures 3.3 cm in diameter, prominent. Mediastinum/Nodes: Visualized thyroid appears normal. No evident thoracic adenopathy. There are a few calcifications in subcarinal lymph nodes which may indicate prior granulomatous disease. No esophageal lesions are appreciable. Lungs/Pleura: There is airspace consolidation in each lower lobe. There is also airspace consolidation in a portion of the posterior segment of the left upper lobe. Mild patchy infiltrate is noted in the inferior lingula. No appreciable pleural effusions. Upper Abdomen: Spleen appears inhomogeneous with diffuse increased attenuation consistent with a degree of auto infarction from known sickle cell disease. Visualized upper abdominal structures otherwise appear unremarkable. Musculoskeletal: Bones are diffusely sclerotic consistent with known sickle cell disease. There are multiple endplate infarcts throughout multiple thoracic vertebral bodies. Review of the MIP images confirms the above findings. IMPRESSION: 1. No demonstrable pulmonary embolus. No thoracic aortic aneurysm or dissection. Mild cardiomegaly present. 2. Multifocal pneumonia, most severe in the lower lobes bilaterally. 3. Prominence of the main  pulmonary outflow tract, a finding indicative of pulmonary arterial hypertension. 4.  No appreciable adenopathy. 5. Bony changes indicative of sickle cell disease. Appearance of spleen also indicative of sickle cell disease. Electronically Signed   By: Bretta Bang III M.D.   On: 01/31/2020 12:29   DG CHEST PORT 1 VIEW  Result Date: 01/30/2020 CLINICAL DATA:  19 year old male with cardiac arrhythmia, lethargy. Sickle cell disease. EXAM: PORTABLE CHEST 1 VIEW COMPARISON:  Chest radiographs 0804 hours today and earlier. FINDINGS: Lower lung volumes. Stable cardiomegaly and mediastinal contours. Increased streaky lower lobe opacity in the medial lung bases greater on the left. No superimposed pneumothorax, pulmonary edema or definite effusion. Paucity of bowel gas.  Stable visualized osseous structures. IMPRESSION: Lower lung volumes and increased bibasilar pulmonary opacity since this morning. Differential considerations include atelectasis, acute chest syndrome, aspiration. Electronically Signed   By: Odessa Fleming M.D.   On: 01/30/2020 22:31    Microbiology: Recent Results (from the past 240 hour(s))  SARS CORONAVIRUS 2 (TAT 6-24 HRS) Nasopharyngeal Nasopharyngeal Swab     Status: None   Collection Time: 01/30/20  1:38 PM   Specimen: Nasopharyngeal Swab  Result Value Ref Range Status   SARS Coronavirus 2 NEGATIVE NEGATIVE Final    Comment: (NOTE) SARS-CoV-2 target nucleic acids are NOT DETECTED. The SARS-CoV-2 RNA is generally detectable in upper and lower respiratory specimens during the acute phase of infection. Negative results do not preclude SARS-CoV-2 infection, do not rule out co-infections with other pathogens, and should not be used as the sole basis for treatment or other patient management decisions. Negative results must be combined with clinical observations, patient history, and epidemiological information. The expected result is Negative. Fact Sheet for  Patients: HairSlick.no Fact Sheet for Healthcare Providers: quierodirigir.com This test is not yet approved or cleared by the [place] FDA and  has been authorized for detection and/or diagnosis of SARS-CoV-2 by FDA under an Emergency Use Authorization (EUA). This EUA will remain  in effect (meaning this test can be used) for the duration of the COVID-19 declaration under Section 56 4(b)(1) of the Act, 21 U.S.C. section 360bbb-3(b)(1), unless the authorization is terminated or revoked sooner. Performed at [facility], [address] [place], [place] [address]   MRSA PCR Screening     Status: None   Collection Time: 01/31/20  1:03 PM   Specimen: Nasal Mucosa; Nasopharyngeal  Result Value Ref Range Status   MRSA by PCR NEGATIVE NEGATIVE Final    Comment:        The GeneXpert MRSA Assay (FDA approved for NASAL specimens only), is one component of a comprehensive MRSA colonization surveillance program. It is not intended to diagnose MRSA infection nor to guide or monitor treatment for MRSA infections. Performed at [facility], [address], [place], [place] [address]      Labs: Basic Metabolic Panel: Recent Labs  Lab 01/30/20 [id number] 01/30/20 0739 01/30/20 2229 01/31/20 0523 02/01/20 0434 02/02/20 0519 02/03/20 0437  NA 139  --  137 136  --   --   --   K 4.0   < > 3.8 3.7  --   --   --   CL 105  --  105 105  --   --   --   CO2 23  --  24 23  --   --   --   GLUCOSE 104*  --  122* 130*  --   --   --   BUN 8  --  9 9  --   --   --   CREATININE 0.57*   < > 0.57* 0.60* 0.51* 0.51* 0.49*  CALCIUM 9.4  --  8.4* 8.3*  --   --   --    < > = values in this interval not displayed.   Liver Function Tests: Recent Labs  Lab 01/30/20 0739 01/30/20 2229 01/31/20 0523  AST 48* 56* 68*  ALT 25 21 23   ALKPHOS 77 80 78  BILITOT 3.3* 4.6* 4.0*  PROT 7.4 7.3 6.8  ALBUMIN 4.3 4.3 3.9   No  results for input(s): LIPASE, AMYLASE in the last 168 hours. No results for input(s):  AMMONIA in the last 168 hours. CBC: Recent Labs  Lab 01/30/20 0739 01/30/20 2229 01/31/20 0523 02/01/20 0433 02/03/20 0437  WBC 13.5* 20.3* 18.4* 13.0* 10.3  NEUTROABS 5.9  --  12.8*  --   --   HGB 10.2* 9.4* 9.3* 9.0* 8.8*  HCT 28.4* 26.7* 26.3* 25.2* 24.9*  MCV 85.3 85.0 84.8 84.8 84.4  PLT 372 322 314 307 379   Cardiac Enzymes: No results for input(s): CKTOTAL, CKMB, CKMBINDEX, TROPONINI in the last 168 hours. BNP: Invalid input(s): POCBNP CBG: No results for input(s): GLUCAP in the last 168 hours.  Time coordinating discharge: 50 minutes  Signed:   Donia Pounds  APRN, MSN, FNP-C [facility] [address] [place], [place] [address] [phone]  [facility] 02/04/2020, 2:12 PM

## 2020-02-04 NOTE — TOC Transition Note (Signed)
Transition of Care [facility]) - CM/SW Discharge Note   Patient Details  Name: [name] Hill MRN: [id number] Date of Birth: 02-05-2001  [facility] CM/SW Contact:  Amada Jupiter, LCSW Phone Number: 02/04/2020, 3:22 PM   Clinical Narrative: Pt ready for d/c home this afternoon once O2 delivered.  No further needs.      Final next level of care: Home/Self Care Barriers to Discharge: Barriers Resolved   Patient Goals and CMS Choice Patient states their goals for this hospitalization and ongoing recovery are:: return home CMS Medicare.gov Compare Post Acute Care list provided to:: Patient Choice offered to / list presented to : Patient  Discharge Placement                       Discharge Plan and Services   Discharge Planning Services: CM Consult            DME Arranged: Oxygen DME Agency: [employer] Date DME Agency Contacted: 02/04/20 Time DME Agency Contacted: [phone] Representative spoke with at DME Agency: [place]            Social Determinants of Health (SDOH) Interventions     Readmission Risk Interventions No flowsheet data found.

## 2020-02-04 NOTE — Progress Notes (Cosign Needed)
    Durable Medical Equipment  (From admission, onward)         Start     Ordered   02/04/20 1134  For home use only DME oxygen  Once    Question Answer Comment  Length of Need 6 Months   Mode or (Route) Nasal cannula   Liters per Minute 2   Frequency Continuous (stationary and portable oxygen unit needed)   Oxygen conserving device No   Oxygen delivery system Gas      02/04/20 1134

## 2020-02-04 NOTE — Discharge Instructions (Signed)
Home oxygen, 2 Liters as discussed. Follow up with PCP in 1 week to discuss oxygen needs.   Home Oxygen Use, Adult When a medical condition keeps you from getting enough oxygen, your health care provider may instruct you to take extra oxygen at home. Your health care provider will let you know:  When to take oxygen.  For how long to take oxygen.  How quickly oxygen should be delivered (flow rate), in liters per minute (LPM or L/M). Home oxygen can be given through:  A mask.  A nasal cannula. This is a device or tube that goes in the nostrils.  A transtracheal catheter. This is a small, flexible tube placed in the trachea.  A tracheostomy. This is a surgically made opening in the trachea. These devices are connected with tubing to an oxygen source, such as:  A tank. Tanks hold oxygen in gas form. They must be replaced when the oxygen is used up.  A liquid oxygen device. This holds oxygen in liquid form. It must be replaced when the oxygen is used up.  An oxygen concentrator machine. This filters oxygen in the room. It uses electricity, so you must have a backup cylinder of oxygen in case the power goes out. Supplies needed: To use oxygen, you will need:  A mask, nasal cannula, transtracheal catheter, or tracheostomy.  An oxygen tank, a liquid oxygen device, or an oxygen concentrator.  The tape that your health care provider recommends (optional). If you use a transtracheal catheter and your prescribed flow rate is 1 LPM or greater, you will also need a humidifier. Risks and complications  Fire. This can happen if the oxygen is exposed to a heat source, flame, or spark.  Injury to skin. This can happen if liquid oxygen touches your skin.  Organ damage. This can happen if you get too little oxygen. How to use oxygen Your health care provider or a representative from your medical device company will show you how to use your oxygen device. Follow her or his instructions. The  instructions may look something like this: 1. Wash your hands. 2. If you use an oxygen concentrator, make sure it is plugged in. 3. Place one end of the tube into the port on the tank, device, or machine. 4. Place the mask over your nose and mouth. Or, place the nasal cannula and secure it with tape if instructed. If you use a tracheostomy or transtracheal catheter, connect it to the oxygen source as directed. 5. Make sure the liter-flow setting on the machine is at the level prescribed by your health care provider. 6. Turn on the machine or adjust the knob on the tank or device to the correct liter-flow setting. 7. When you are done, turn off and unplug the machine, or turn the knob to OFF. How to clean and care for the oxygen supplies Nasal cannula  Clean it with a warm, wet cloth daily or as needed.  Wash it with a liquid soap once a week.  Rinse it thoroughly once or twice a week.  Replace it every 2-4 weeks.  If you have an infection, such as a cold or pneumonia, change the cannula when you get better. Mask  Replace it every 2-4 weeks.  If you have an infection, such as a cold or pneumonia, change the mask when you get better. Humidifier bottle  Wash the bottle between each refill: ? Wash it with soap and warm water. ? Rinse it thoroughly. ? Disinfect it and  its top. ? Air-dry it.  Make sure it is dry before you refill it. Oxygen concentrator  Clean the air filter at least twice a week according to directions from your home medical equipment and service company.  Wipe down the cabinet every day. To do this: ? Unplug the unit. ? Wipe down the cabinet with a damp cloth. ? Dry the cabinet. Other equipment  Change any extra tubing every 1-3 months.  Follow instructions from your health care provider about taking care of any other equipment. Safety tips Fire safety tips   Keep your oxygen and oxygen supplies at least 5 ft away from sources of heat, flames, and  sparks at all times.  Do not allow smoking near your oxygen. Put up "no smoking" signs in your home. Avoid smoking areas when in public.  Do not use materials that can burn (are flammable) while you use oxygen.  When you go to a restaurant with portable oxygen, ask to be seated in the nonsmoking section.  Keep a Data processing manager close by. Let your fire department know that you have oxygen in your home.  Test your home smoke detectors regularly. Traveling  Secure your oxygen tank in the vehicle so that it does not move around. Follow instructions from your medical device company about how to safely secure your tank.  Make sure you have enough oxygen for the amount of time you will be away from home.  If you are planning air travel, contact the airline to find out if they allow the use of an approved portable oxygen concentrator. You may also need documents from your health care provider and medical device company before you travel. General safety tips  If you use an oxygen cylinder, make sure it is in a stand or secured to an object that will not move (fixed object).  If you use liquid oxygen, make sure its container is kept upright.  If you use an oxygen concentrator: ? [employer] movement psychotherapist company. Make sure you are given priority service in the event that your power goes out. ? Avoid using extension cords, if possible. Follow these instructions at home:  Use oxygen only as told by your health care provider.  Do not use alcohol or other drugs that make you relax (sedating drugs) unless instructed. They can slow down your breathing rate and make it hard to get in enough oxygen.  Know how and when to order a refill of oxygen.  Always keep a spare tank of oxygen. Plan ahead for holidays when you may not be able to get a prescription filled.  Use water-based lubricants on your lips or nostrils. Do not use oil-based products like petroleum jelly.  To prevent skin irritation on your  cheeks or behind your ears, tuck some gauze under the tubing. Contact a health care provider if:  You get headaches often.  You have shortness of breath.  You have a lasting cough.  You have anxiety.  You are sleepy all the time.  You develop an illness that affects your breathing.  You cannot exercise at your regular level.  You are restless.  You have difficult or irregular breathing, and it is getting worse.  You have a fever.  You have persistent redness under your nose. Get help right away if:  You are confused.  You have blue lips or fingernails.  You are struggling to breathe. Summary  Your health care provider or a representative from your medical device company will show you  how to use your oxygen device. Follow her or his instructions.  If you use an oxygen concentrator, make sure it is plugged in.  Make sure the liter-flow setting on the machine is at the level prescribed by your health care provider.  Keep your oxygen and oxygen supplies at least 5 ft away from sources of heat, flames, and sparks at all times. This information is not intended to replace advice given to you by your health care provider. Make sure you discuss any questions you have with your health care provider. Document Revised: 04/30/2018 Document Reviewed: 06/04/2016 Elsevier Patient Education  2020 [employer]  Community-Acquired Pneumonia, Adult Pneumonia is a type of lung infection that causes swelling in the airways of the lungs. Mucus and fluid may also build up inside the airways. This may cause coughing and difficulty breathing. There are different types of pneumonia. One type can develop while a person is in a hospital. A different type is called community-acquired pneumonia. It develops in people who are not, and have not recently been, in the hospital or another type of health care facility. What are the causes? This condition may be caused by:  Viruses. This is the most  common cause of pneumonia.  Bacteria. Community-acquired pneumonia is often caused by Streptococcus pneumoniae bacteria. These bacteria are often passed from one person to another by breathing in droplets from the cough or sneeze of an infected person.  Fungi. This is the least common cause of pneumonia. What increases the risk? The following factors may make you more likely to develop this condition:  Having a chronic disease, such as chronic obstructive pulmonary disease (COPD), asthma, congestive heart failure, cystic fibrosis, diabetes, or kidney disease.  Having early-stage or late-stage HIV.  Having sickle cell disease.  Having had your spleen removed (splenectomy).  Having poor dental hygiene.  Having a medical condition that increases the risk of breathing in (aspirating) secretions from your own mouth and nose.  Having a weakened body defense system (immune system).  Being a smoker.  Traveling to areas where pneumonia-causing germs commonly exist.  Being around animal habitats or animals that have pneumonia-causing germs, including birds, bats, rabbits, cats, and farm animals. What are the signs or symptoms? Symptoms of this condition include:  A dry cough.  A wet (productive) cough.  Fever.  Sweating.  Chest pain, especially when breathing deeply or coughing.  Rapid breathing or difficulty breathing.  Shortness of breath.  Shaking chills.  Fatigue.  Muscle aches. How is this diagnosed? This condition may be diagnosed based on:  Your medical history.  A physical exam. You may also have tests, including:  Chest X-rays.  Tests of your blood oxygen level and other blood gases.  Tests on blood, mucus (sputum), fluid around your lungs (pleural fluid), and urine. If your pneumonia is severe, other tests may be done to find the exact cause of your illness. How is this treated? Treatment for this condition depends on many factors, such as the cause of  your pneumonia, the medicines you take, and other medical conditions that you have. For most adults, treatment and recovery from pneumonia may occur at home. In some cases, treatment must happen in a hospital. Treatment may include:  Medicines that are given by mouth or through an IV, including: ? Antibiotic medicines, if the pneumonia was caused by bacteria. ? Antiviral medicines, if the pneumonia was caused by a virus.  Being given extra oxygen.  Respiratory therapy. Although rare, treating severe pneumonia  may include:  Using a machine to help you breathe (mechanical ventilation). This is done if you are not breathing well on your own and you cannot maintain a safe blood oxygen level.  Thoracentesis. This is a procedure to remove fluid from around one lung or both lungs to help you breathe better. Follow these instructions at home:  Medicines  Take over-the-counter and prescription medicines only as told by your health care provider. ? Only take cough medicine if you are losing sleep. Be aware that cough medicine can prevent your body's natural ability to remove mucus from your lungs.  If you were prescribed an antibiotic medicine, take it as told by your health care provider. Do not stop taking the antibiotic even if you start to feel better. General instructions  Sleep in a semi-upright position at night. Try sleeping in a reclining chair, or place a few pillows under your head.  Rest as needed and get at least 8 hours of sleep each night.  Drink enough water to keep your urine pale yellow. This will help to thin out mucus secretions in your lungs.  Eat a healthy diet that includes plenty of vegetables, fruits, whole grains, low-fat dairy products, and lean protein.  Do not use any products that contain nicotine or tobacco, such as cigarettes, e-cigarettes, and chewing tobacco. If you need help quitting, ask your health care provider.  Keep all follow-up visits as told by your  health care provider. This is important. How is this prevented? You can lower your risk of developing community-acquired pneumonia by:  Getting a pneumococcal vaccine. There are different types and schedules of pneumococcal vaccines. Ask your health care provider which option is best for you. Consider getting the vaccine if: ? You are older than 21 years of age. ? You are older than 21 years of age and are undergoing cancer treatment, have chronic lung disease, or have other medical conditions that affect your immune system. Ask your health care provider if this applies to you.  Getting an influenza vaccine every year. Ask your health care provider which type of vaccine is best for you.  Getting regular checkups from your dentist.  Washing your hands often. If soap and water are not available, use hand sanitizer. Contact a health care provider if:  You have a fever.  You are losing sleep because you cannot control your cough with cough medicine. Get help right away if:  You have worsening shortness of breath.  You have increased chest pain.  Your sickness becomes worse, especially if you are an older adult or have a weakened immune system.  You cough up blood. Summary  Pneumonia is an infection of the lungs.  Community-acquired pneumonia develops in people who have not been in the hospital. It can be caused by bacteria, viruses, or fungi.  This condition may be treated with antibiotics or antiviral medicines.  Severe cases may require hospitalization, mechanical ventilation, and other procedures to drain fluid from the lungs. This information is not intended to replace advice given to you by your health care provider. Make sure you discuss any questions you have with your health care provider. Document Revised: 07/09/2018 Document Reviewed: 07/09/2018 Elsevier Patient Education  2020 [employer].

## 2020-02-04 NOTE — Plan of Care (Signed)
  Problem: Education: Goal: Knowledge of vaso-occlusive preventative measures will improve Outcome: Adequate for Discharge Goal: Awareness of infection prevention will improve Outcome: Adequate for Discharge Goal: Awareness of signs and symptoms of anemia will improve Outcome: Adequate for Discharge Goal: Long-term complications will improve Outcome: Adequate for Discharge   Problem: Self-Care: Goal: Ability to incorporate actions that prevent/reduce pain crisis will improve Outcome: Adequate for Discharge   Problem: Bowel/Gastric: Goal: Gut motility will be maintained Outcome: Adequate for Discharge   Problem: Tissue Perfusion: Goal: Complications related to inadequate tissue perfusion will be avoided or minimized Outcome: Adequate for Discharge   Problem: Respiratory: Goal: Pulmonary complications will be avoided or minimized Outcome: Adequate for Discharge Goal: Acute Chest Syndrome will be identified early to prevent complications Outcome: Adequate for Discharge   Problem: Fluid Volume: Goal: Ability to maintain a balanced intake and output will improve Outcome: Adequate for Discharge   Problem: Sensory: Goal: Pain level will decrease with appropriate interventions Outcome: Adequate for Discharge   Problem: Health Behavior: Goal: Postive changes in compliance with treatment and prescription regimens will improve Outcome: Adequate for Discharge   Problem: Education: Goal: Knowledge of General Education information will improve Description: Including pain rating scale, medication(s)/side effects and non-pharmacologic comfort measures Outcome: Adequate for Discharge   Problem: Health Behavior/Discharge Planning: Goal: Ability to manage health-related needs will improve Outcome: Adequate for Discharge   Problem: Clinical Measurements: Goal: Ability to maintain clinical measurements within normal limits will improve Outcome: Adequate for Discharge Goal: Will remain  free from infection Outcome: Adequate for Discharge Goal: Diagnostic test results will improve Outcome: Adequate for Discharge Goal: Respiratory complications will improve Outcome: Adequate for Discharge Goal: Cardiovascular complication will be avoided Outcome: Adequate for Discharge   Problem: Activity: Goal: Risk for activity intolerance will decrease Outcome: Adequate for Discharge   Problem: Nutrition: Goal: Adequate nutrition will be maintained Outcome: Adequate for Discharge   Problem: Coping: Goal: Level of anxiety will decrease Outcome: Adequate for Discharge   Problem: Elimination: Goal: Will not experience complications related to bowel motility Outcome: Adequate for Discharge Goal: Will not experience complications related to urinary retention Outcome: Adequate for Discharge   Problem: Pain Managment: Goal: General experience of comfort will improve Outcome: Adequate for Discharge   Problem: Safety: Goal: Ability to remain free from injury will improve Outcome: Adequate for Discharge   Problem: Skin Integrity: Goal: Risk for impaired skin integrity will decrease Outcome: Adequate for Discharge   

## 2020-02-08 DIAGNOSIS — D5700 Hb-SS disease with crisis, unspecified: Secondary | ICD-10-CM | POA: Diagnosis not present

## 2020-02-08 DIAGNOSIS — R0602 Shortness of breath: Secondary | ICD-10-CM | POA: Diagnosis not present

## 2020-02-10 DIAGNOSIS — H5213 Myopia, bilateral: Secondary | ICD-10-CM | POA: Diagnosis not present

## 2020-02-11 ENCOUNTER — Encounter: Payer: Self-pay | Admitting: Family Medicine

## 2020-02-11 ENCOUNTER — Other Ambulatory Visit: Payer: Self-pay

## 2020-02-11 ENCOUNTER — Ambulatory Visit (INDEPENDENT_AMBULATORY_CARE_PROVIDER_SITE_OTHER): Payer: Medicaid Other | Admitting: Family Medicine

## 2020-02-11 VITALS — BP 120/60 | HR 82 | Temp 98.2°F | Ht 68.0 in | Wt 205.2 lb

## 2020-02-11 DIAGNOSIS — Z09 Encounter for follow-up examination after completed treatment for conditions other than malignant neoplasm: Secondary | ICD-10-CM

## 2020-02-11 DIAGNOSIS — G894 Chronic pain syndrome: Secondary | ICD-10-CM

## 2020-02-11 DIAGNOSIS — D571 Sickle-cell disease without crisis: Secondary | ICD-10-CM

## 2020-02-11 DIAGNOSIS — F1190 Opioid use, unspecified, uncomplicated: Secondary | ICD-10-CM

## 2020-02-11 DIAGNOSIS — R220 Localized swelling, mass and lump, head: Secondary | ICD-10-CM

## 2020-02-11 LAB — POCT URINALYSIS DIPSTICK
Bilirubin, UA: NEGATIVE
Blood, UA: NEGATIVE
Glucose, UA: NEGATIVE
Ketones, UA: NEGATIVE
Leukocytes, UA: NEGATIVE
Nitrite, UA: NEGATIVE
Protein, UA: POSITIVE — AB
Spec Grav, UA: 1.015 (ref 1.010–1.025)
Urobilinogen, UA: 0.2 E.U./dL
pH, UA: 5 (ref 5.0–8.0)
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
## 2020-02-24 DIAGNOSIS — R0602 Shortness of breath: Secondary | ICD-10-CM | POA: Diagnosis not present

## 2020-02-24 DIAGNOSIS — D5700 Hb-SS disease with crisis, unspecified: Secondary | ICD-10-CM | POA: Diagnosis not present

## 2020-03-06 ENCOUNTER — Telehealth: Payer: Self-pay | Admitting: Family Medicine

## 2020-03-06 NOTE — Telephone Encounter (Signed)
Pt called in medication refill for oxycodone. Pt currently in pain and was unable to be admitted into the day hospital

## 2020-03-07 ENCOUNTER — Other Ambulatory Visit: Payer: Self-pay | Admitting: Family Medicine

## 2020-03-07 DIAGNOSIS — D571 Sickle-cell disease without crisis: Secondary | ICD-10-CM

## 2020-03-07 MED ORDER — OXYCODONE HCL 10 MG PO TABS: 10.0000 mg | ORAL_TABLET | ORAL | 0 refills | Status: DC | PRN

## 2020-03-15 ENCOUNTER — Emergency Department (HOSPITAL_COMMUNITY)
Admission: EM | Admit: 2020-03-15 | Discharge: 2020-03-15 | Disposition: A | Discharge status: Home / Self Care | Payer: Medicaid Other | Attending: Emergency Medicine | Admitting: Emergency Medicine

## 2020-03-15 ENCOUNTER — Encounter (HOSPITAL_COMMUNITY): Payer: Self-pay

## 2020-03-15 ENCOUNTER — Other Ambulatory Visit: Payer: Self-pay

## 2020-03-15 DIAGNOSIS — R519 Headache, unspecified: Secondary | ICD-10-CM | POA: Insufficient documentation

## 2020-03-15 DIAGNOSIS — H9202 Otalgia, left ear: Secondary | ICD-10-CM | POA: Insufficient documentation

## 2020-03-15 DIAGNOSIS — Z79899 Other long term (current) drug therapy: Secondary | ICD-10-CM | POA: Diagnosis not present

## 2020-03-15 DIAGNOSIS — I10 Essential (primary) hypertension: Secondary | ICD-10-CM | POA: Diagnosis not present

## 2020-03-15 MED ORDER — NAPROXEN 375 MG PO TABS
375.0000 mg | ORAL_TABLET | Freq: Once | ORAL | Status: AC
  Administered 2020-03-15: 375 mg via ORAL
  Filled 2020-03-15: qty 1

## 2020-03-15 MED ORDER — METHOCARBAMOL 500 MG PO TABS: 500.0000 mg | ORAL_TABLET | Freq: Two times a day (BID) | ORAL | 0 refills | Status: DC

## 2020-03-15 MED ORDER — NAPROXEN 500 MG PO TABS: 500.0000 mg | ORAL_TABLET | Freq: Two times a day (BID) | ORAL | 0 refills | Status: DC

## 2020-03-15 NOTE — ED Triage Notes (Addendum)
Patient arrived stating this morning he began having tingling/pain around his left eye to his ear. Reports taking an Ibuprofen for pain at 8pm with no relief. In no distress.
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
## 2020-03-15 NOTE — Discharge Instructions (Addendum)
Take naproxen as directed.  Take Robaxin as prescribed. This medication will make you drowsy so do not drive or drink alcohol when taking it.  As we discussed, mouthguard may help with your symptoms.  As we discussed, limiting the amount of gum that you are chewing may help with your symptoms.  Return emergency department for any redness or swelling of the face, fever, difficulty breathing, difficulty swallowing, vomiting or any other worsening or concerning symptoms.

## 2020-03-25 ENCOUNTER — Emergency Department (HOSPITAL_COMMUNITY)
Admission: EM | Admit: 2020-03-25 | Discharge: 2020-03-25 | Disposition: A | Discharge status: Home / Self Care | Payer: Medicaid Other | Attending: Emergency Medicine | Admitting: Emergency Medicine

## 2020-03-25 ENCOUNTER — Other Ambulatory Visit: Payer: Self-pay

## 2020-03-25 DIAGNOSIS — M79604 Pain in right leg: Secondary | ICD-10-CM | POA: Diagnosis present

## 2020-03-25 DIAGNOSIS — D5700 Hb-SS disease with crisis, unspecified: Secondary | ICD-10-CM | POA: Insufficient documentation

## 2020-03-25 DIAGNOSIS — R0602 Shortness of breath: Secondary | ICD-10-CM | POA: Diagnosis not present

## 2020-03-25 DIAGNOSIS — Z79899 Other long term (current) drug therapy: Secondary | ICD-10-CM | POA: Diagnosis not present

## 2020-03-25 DIAGNOSIS — M79605 Pain in left leg: Secondary | ICD-10-CM | POA: Insufficient documentation

## 2020-03-25 DIAGNOSIS — J45909 Unspecified asthma, uncomplicated: Secondary | ICD-10-CM | POA: Diagnosis not present

## 2020-03-25 LAB — CBC WITH DIFFERENTIAL/PLATELET
Abs Immature Granulocytes: 0.05 10*3/uL (ref 0.00–0.07)
Basophils Absolute: 0.1 10*3/uL (ref 0.0–0.1)
Basophils Relative: 1 %
Eosinophils Absolute: 0.1 10*3/uL (ref 0.0–0.5)
Eosinophils Relative: 1 %
HCT: 28.1 % — ABNORMAL LOW (ref 39.0–52.0)
Hemoglobin: 10.1 g/dL — ABNORMAL LOW (ref 13.0–17.0)
Immature Granulocytes: 0 %
Lymphocytes Relative: 38 %
Lymphs Abs: 4.4 10*3/uL — ABNORMAL HIGH (ref 0.7–4.0)
MCH: 30.5 pg (ref 26.0–34.0)
MCHC: 35.9 g/dL (ref 30.0–36.0)
MCV: 84.9 fL (ref 80.0–100.0)
Monocytes Absolute: 1.6 10*3/uL — ABNORMAL HIGH (ref 0.1–1.0)
Monocytes Relative: 14 %
Neutro Abs: 5.4 10*3/uL (ref 1.7–7.7)
Neutrophils Relative %: 46 %
Platelets: 495 10*3/uL — ABNORMAL HIGH (ref 150–400)
RBC: 3.31 MIL/uL — ABNORMAL LOW (ref 4.22–5.81)
RDW: 18.7 % — ABNORMAL HIGH (ref 11.5–15.5)
WBC: 11.7 10*3/uL — ABNORMAL HIGH (ref 4.0–10.5)
nRBC: 0.7 % — ABNORMAL HIGH (ref 0.0–0.2)

## 2020-03-25 LAB — RETICULOCYTES
Immature Retic Fract: 42.7 % — ABNORMAL HIGH (ref 2.3–15.9)
RBC.: 3.29 MIL/uL — ABNORMAL LOW (ref 4.22–5.81)
Retic Count, Absolute: 141 10*3/uL (ref 19.0–186.0)
Retic Ct Pct: 4.5 % — ABNORMAL HIGH (ref 0.4–3.1)

## 2020-03-25 LAB — BASIC METABOLIC PANEL
Anion gap: 7 (ref 5–15)
BUN: 8 mg/dL (ref 6–20)
CO2: 25 mmol/L (ref 22–32)
Calcium: 9 mg/dL (ref 8.9–10.3)
Chloride: 105 mmol/L (ref 98–111)
Creatinine, Ser: 0.62 mg/dL (ref 0.61–1.24)
GFR calc Af Amer: >60 mL/min (ref >60)
GFR calc non Af Amer: >60 mL/min (ref >60)
Glucose, Bld: 90 mg/dL (ref 70–99)
Potassium: 3.6 mmol/L (ref 3.5–5.1)
Sodium: 137 mmol/L (ref 135–145)

## 2020-03-25 MED ORDER — HYDROMORPHONE HCL 2 MG/ML IJ SOLN
2.0000 mg | INTRAMUSCULAR | Status: AC
  Administered 2020-03-25: 2 mg via INTRAVENOUS
  Filled 2020-03-25: qty 1

## 2020-03-25 MED ORDER — DIPHENHYDRAMINE HCL 50 MG/ML IJ SOLN
25.0000 mg | Freq: Once | INTRAMUSCULAR | Status: AC
  Administered 2020-03-25: 25 mg via INTRAVENOUS
  Filled 2020-03-25: qty 1

## 2020-03-25 MED ORDER — ONDANSETRON HCL 4 MG/2ML IJ SOLN
4.0000 mg | INTRAMUSCULAR | Status: DC | PRN
  Administered 2020-03-25: 4 mg via INTRAVENOUS
  Filled 2020-03-25: qty 2

## 2020-03-25 MED ORDER — KETOROLAC TROMETHAMINE 15 MG/ML IJ SOLN
15.0000 mg | INTRAMUSCULAR | Status: AC
  Administered 2020-03-25: 15 mg via INTRAVENOUS
  Filled 2020-03-25: qty 1

## 2020-03-25 MED ORDER — SODIUM CHLORIDE 0.45 % IV SOLN: INTRAVENOUS | Status: DC

## 2020-03-25 NOTE — ED Triage Notes (Signed)
Pt c/o bilat leg sickle cell pain that started early this morning after work.
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
## 2020-03-25 NOTE — Discharge Instructions (Signed)
Take your At Home pain medications.  Follow-up with the sickle cell clinic.  Return the emergency department for fever, chest pain, difficulty breathing, worsening pain or any other worsening concerning symptoms.

## 2020-04-12 ENCOUNTER — Ambulatory Visit: Payer: Medicaid Other | Admitting: Family Medicine

## 2020-08-19 ENCOUNTER — Emergency Department (HOSPITAL_COMMUNITY): Payer: Medicaid Other

## 2020-08-19 ENCOUNTER — Encounter (HOSPITAL_COMMUNITY): Payer: Self-pay | Admitting: Emergency Medicine

## 2020-08-19 ENCOUNTER — Other Ambulatory Visit: Payer: Self-pay

## 2020-08-19 ENCOUNTER — Inpatient Hospital Stay (HOSPITAL_COMMUNITY)
Admission: EM | Admit: 2020-08-19 | Discharge: 2020-08-21 | DRG: 177 | Discharge status: Against Medical Advice | Payer: Medicaid Other | Attending: Internal Medicine | Admitting: Internal Medicine

## 2020-08-19 DIAGNOSIS — Z79891 Long term (current) use of opiate analgesic: Secondary | ICD-10-CM | POA: Diagnosis not present

## 2020-08-19 DIAGNOSIS — J9601 Acute respiratory failure with hypoxia: Secondary | ICD-10-CM | POA: Diagnosis not present

## 2020-08-19 DIAGNOSIS — D5700 Hb-SS disease with crisis, unspecified: Secondary | ICD-10-CM | POA: Diagnosis not present

## 2020-08-19 DIAGNOSIS — F1190 Opioid use, unspecified, uncomplicated: Secondary | ICD-10-CM | POA: Diagnosis present

## 2020-08-19 DIAGNOSIS — R739 Hyperglycemia, unspecified: Secondary | ICD-10-CM | POA: Diagnosis not present

## 2020-08-19 DIAGNOSIS — G894 Chronic pain syndrome: Secondary | ICD-10-CM | POA: Diagnosis present

## 2020-08-19 DIAGNOSIS — R5081 Fever presenting with conditions classified elsewhere: Secondary | ICD-10-CM | POA: Diagnosis present

## 2020-08-19 DIAGNOSIS — J9691 Respiratory failure, unspecified with hypoxia: Secondary | ICD-10-CM | POA: Diagnosis present

## 2020-08-19 DIAGNOSIS — T380X5A Adverse effect of glucocorticoids and synthetic analogues, initial encounter: Secondary | ICD-10-CM | POA: Diagnosis not present

## 2020-08-19 DIAGNOSIS — R0902 Hypoxemia: Secondary | ICD-10-CM | POA: Diagnosis present

## 2020-08-19 DIAGNOSIS — U071 COVID-19: Principal | ICD-10-CM | POA: Diagnosis present

## 2020-08-19 DIAGNOSIS — Z832 Family history of diseases of the blood and blood-forming organs and certain disorders involving the immune mechanism: Secondary | ICD-10-CM

## 2020-08-19 DIAGNOSIS — Z79899 Other long term (current) drug therapy: Secondary | ICD-10-CM | POA: Diagnosis not present

## 2020-08-19 DIAGNOSIS — J1282 Pneumonia due to coronavirus disease 2019: Secondary | ICD-10-CM | POA: Diagnosis present

## 2020-08-19 DIAGNOSIS — Z8249 Family history of ischemic heart disease and other diseases of the circulatory system: Secondary | ICD-10-CM | POA: Diagnosis not present

## 2020-08-19 DIAGNOSIS — R0602 Shortness of breath: Secondary | ICD-10-CM | POA: Diagnosis present

## 2020-08-19 DIAGNOSIS — R609 Edema, unspecified: Secondary | ICD-10-CM | POA: Diagnosis not present

## 2020-08-19 DIAGNOSIS — Z888 Allergy status to other drugs, medicaments and biological substances status: Secondary | ICD-10-CM | POA: Diagnosis not present

## 2020-08-19 DIAGNOSIS — D571 Sickle-cell disease without crisis: Secondary | ICD-10-CM | POA: Diagnosis not present

## 2020-08-19 DIAGNOSIS — B342 Coronavirus infection, unspecified: Secondary | ICD-10-CM

## 2020-08-19 DIAGNOSIS — R509 Fever, unspecified: Secondary | ICD-10-CM | POA: Diagnosis not present

## 2020-08-19 HISTORY — DX: Coronavirus infection, unspecified: B34.2

## 2020-08-19 LAB — COMPREHENSIVE METABOLIC PANEL
ALT: 22 U/L (ref 0–44)
AST: 41 U/L (ref 15–41)
Albumin: 4.1 g/dL (ref 3.5–5.0)
Alkaline Phosphatase: 48 U/L (ref 38–126)
Anion gap: 8 (ref 5–15)
BUN: 10 mg/dL (ref 6–20)
CO2: 25 mmol/L (ref 22–32)
Calcium: 8.8 mg/dL — ABNORMAL LOW (ref 8.9–10.3)
Chloride: 103 mmol/L (ref 98–111)
Creatinine, Ser: 0.83 mg/dL (ref 0.61–1.24)
GFR calc Af Amer: >60 mL/min (ref >60)
GFR calc non Af Amer: >60 mL/min (ref >60)
Glucose, Bld: 97 mg/dL (ref 70–99)
Potassium: 4.1 mmol/L (ref 3.5–5.1)
Sodium: 136 mmol/L (ref 135–145)
Total Bilirubin: 4 mg/dL — ABNORMAL HIGH (ref 0.3–1.2)
Total Protein: 6.9 g/dL (ref 6.5–8.1)

## 2020-08-19 LAB — CBC
HCT: 25.9 % — ABNORMAL LOW (ref 39.0–52.0)
Hemoglobin: 9.3 g/dL — ABNORMAL LOW (ref 13.0–17.0)
MCH: 30.1 pg (ref 26.0–34.0)
MCHC: 35.9 g/dL (ref 30.0–36.0)
MCV: 83.8 fL (ref 80.0–100.0)
Platelets: 361 10*3/uL (ref 150–400)
RBC: 3.09 MIL/uL — ABNORMAL LOW (ref 4.22–5.81)
RDW: 19.9 % — ABNORMAL HIGH (ref 11.5–15.5)
WBC: 8.4 10*3/uL (ref 4.0–10.5)
nRBC: 0.6 % — ABNORMAL HIGH (ref 0.0–0.2)

## 2020-08-19 LAB — RESPIRATORY PANEL BY RT PCR (FLU A&B, COVID)
Influenza A by PCR: NEGATIVE
Influenza B by PCR: NEGATIVE
SARS Coronavirus 2 by RT PCR: POSITIVE — AB

## 2020-08-19 LAB — RETICULOCYTES
Immature Retic Fract: 28.2 % — ABNORMAL HIGH (ref 2.3–15.9)
RBC.: 3.09 MIL/uL — ABNORMAL LOW (ref 4.22–5.81)
Retic Count, Absolute: 212 10*3/uL — ABNORMAL HIGH (ref 19.0–186.0)
Retic Ct Pct: 6.9 % — ABNORMAL HIGH (ref 0.4–3.1)

## 2020-08-19 LAB — D-DIMER, QUANTITATIVE: D-Dimer, Quant: 2.57 ug/mL-FEU — ABNORMAL HIGH (ref 0.00–0.50)

## 2020-08-19 LAB — DIFFERENTIAL
Abs Immature Granulocytes: 0.03 10*3/uL (ref 0.00–0.07)
Basophils Absolute: 0.1 10*3/uL (ref 0.0–0.1)
Basophils Relative: 1 %
Eosinophils Absolute: 0.1 10*3/uL (ref 0.0–0.5)
Eosinophils Relative: 1 %
Immature Granulocytes: 0 %
Lymphocytes Relative: 15 %
Lymphs Abs: 1.2 10*3/uL (ref 0.7–4.0)
Monocytes Absolute: 2 10*3/uL — ABNORMAL HIGH (ref 0.1–1.0)
Monocytes Relative: 24 %
Neutro Abs: 5 10*3/uL (ref 1.7–7.7)
Neutrophils Relative %: 59 %

## 2020-08-19 LAB — PROCALCITONIN: Procalcitonin: 0.21 ng/mL

## 2020-08-19 LAB — FIBRINOGEN: Fibrinogen: 184 mg/dL — ABNORMAL LOW (ref 210–475)

## 2020-08-19 LAB — TRIGLYCERIDES: Triglycerides: 80 mg/dL (ref <150)

## 2020-08-19 LAB — LACTIC ACID, PLASMA: Lactic Acid, Venous: 1.2 mmol/L (ref 0.5–1.9)

## 2020-08-19 LAB — LACTATE DEHYDROGENASE: LDH: 448 U/L — ABNORMAL HIGH (ref 98–192)

## 2020-08-19 MED ORDER — ONDANSETRON HCL 4 MG/2ML IJ SOLN: 4.0000 mg | INTRAMUSCULAR | Status: DC | PRN

## 2020-08-19 MED ORDER — POLYETHYLENE GLYCOL 3350 17 G PO PACK: 17.0000 g | PACK | Freq: Every day | ORAL | Status: DC | PRN

## 2020-08-19 MED ORDER — SODIUM CHLORIDE 0.9 % IV SOLN
200.0000 mg | Freq: Once | INTRAVENOUS | Status: AC
  Administered 2020-08-20: 200 mg via INTRAVENOUS
  Filled 2020-08-19: qty 200

## 2020-08-19 MED ORDER — INSULIN ASPART 100 UNIT/ML ~~LOC~~ SOLN
0.0000 [IU] | Freq: Three times a day (TID) | SUBCUTANEOUS | Status: DC
  Administered 2020-08-20 – 2020-08-21 (×3): 3 [IU] via SUBCUTANEOUS
  Filled 2020-08-19: qty 0.2

## 2020-08-19 MED ORDER — ONDANSETRON HCL 4 MG PO TABS: 4.0000 mg | ORAL_TABLET | ORAL | Status: DC | PRN

## 2020-08-19 MED ORDER — HYDROCOD POLST-CPM POLST ER 10-8 MG/5ML PO SUER: 5.0000 mL | Freq: Two times a day (BID) | ORAL | Status: DC | PRN

## 2020-08-19 MED ORDER — OXYCODONE HCL 5 MG PO TABS: 5.0000 mg | ORAL_TABLET | ORAL | Status: DC | PRN

## 2020-08-19 MED ORDER — METHYLPREDNISOLONE SODIUM SUCC 125 MG IJ SOLR
1.0000 mg/kg | Freq: Two times a day (BID) | INTRAMUSCULAR | Status: DC
  Administered 2020-08-20: 100 mg via INTRAVENOUS
  Filled 2020-08-19: qty 2

## 2020-08-19 MED ORDER — PANTOPRAZOLE SODIUM 40 MG PO TBEC
40.0000 mg | DELAYED_RELEASE_TABLET | Freq: Every day | ORAL | Status: DC
  Administered 2020-08-20: 40 mg via ORAL
  Filled 2020-08-19 (×2): qty 1

## 2020-08-19 MED ORDER — ACETAMINOPHEN 325 MG PO TABS: 650.0000 mg | ORAL_TABLET | Freq: Four times a day (QID) | ORAL | Status: DC | PRN

## 2020-08-19 MED ORDER — ACETAMINOPHEN 325 MG PO TABS
650.0000 mg | ORAL_TABLET | Freq: Once | ORAL | Status: AC
  Administered 2020-08-19: 650 mg via ORAL
  Filled 2020-08-19: qty 2

## 2020-08-19 MED ORDER — DEXAMETHASONE SODIUM PHOSPHATE 10 MG/ML IJ SOLN
6.0000 mg | Freq: Once | INTRAMUSCULAR | Status: AC
  Administered 2020-08-20: 6 mg via INTRAVENOUS
  Filled 2020-08-19: qty 1

## 2020-08-19 MED ORDER — PREDNISONE 20 MG PO TABS: 50.0000 mg | ORAL_TABLET | Freq: Every day | ORAL | Status: DC

## 2020-08-19 MED ORDER — ALBUTEROL SULFATE HFA 108 (90 BASE) MCG/ACT IN AERS
2.0000 | INHALATION_SPRAY | Freq: Four times a day (QID) | RESPIRATORY_TRACT | Status: DC
  Filled 2020-08-19: qty 6.7

## 2020-08-19 MED ORDER — HYDROXYUREA 500 MG PO CAPS: 1000.0000 mg | ORAL_CAPSULE | Freq: Two times a day (BID) | ORAL | Status: DC

## 2020-08-19 MED ORDER — SENNOSIDES-DOCUSATE SODIUM 8.6-50 MG PO TABS
1.0000 | ORAL_TABLET | Freq: Two times a day (BID) | ORAL | Status: DC
  Administered 2020-08-20: 1 via ORAL
  Filled 2020-08-19 (×3): qty 1

## 2020-08-19 MED ORDER — SODIUM CHLORIDE 0.9 % IV SOLN
100.0000 mg | Freq: Every day | INTRAVENOUS | Status: DC
  Administered 2020-08-20 – 2020-08-21 (×2): 100 mg via INTRAVENOUS
  Filled 2020-08-19 (×2): qty 20

## 2020-08-19 MED ORDER — SORBITOL 70 % SOLN
30.0000 mL | Freq: Every day | Status: DC | PRN
  Filled 2020-08-19: qty 30

## 2020-08-19 MED ORDER — GUAIFENESIN-DM 100-10 MG/5ML PO SYRP: 10.0000 mL | ORAL_SOLUTION | ORAL | Status: DC | PRN

## 2020-08-19 NOTE — ED Notes (Addendum)
Ambulated Pt around room, oxygen was at 98%on room air and then after a while, the oxygen dropped to 88% on room air and Pt stated that he felt SOB overtime if walking around the room.
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
## 2020-08-19 NOTE — ED Triage Notes (Signed)
Patient arrives complaining of Covid symptoms that began this morning. Patient states diarrhea, fever, chills and body aches. Patient has been using tylenol and ibuprofen around the clock. Patient states highest fever at home 101. Patient endorses shortness of breath

## 2020-08-20 ENCOUNTER — Other Ambulatory Visit: Payer: Self-pay

## 2020-08-20 DIAGNOSIS — R0902 Hypoxemia: Secondary | ICD-10-CM

## 2020-08-20 DIAGNOSIS — F1190 Opioid use, unspecified, uncomplicated: Secondary | ICD-10-CM

## 2020-08-20 DIAGNOSIS — G894 Chronic pain syndrome: Secondary | ICD-10-CM

## 2020-08-20 DIAGNOSIS — J9691 Respiratory failure, unspecified with hypoxia: Secondary | ICD-10-CM | POA: Diagnosis present

## 2020-08-20 DIAGNOSIS — U071 COVID-19: Principal | ICD-10-CM

## 2020-08-20 DIAGNOSIS — D5700 Hb-SS disease with crisis, unspecified: Secondary | ICD-10-CM

## 2020-08-20 DIAGNOSIS — J9601 Acute respiratory failure with hypoxia: Secondary | ICD-10-CM

## 2020-08-20 DIAGNOSIS — J1282 Pneumonia due to coronavirus disease 2019: Secondary | ICD-10-CM

## 2020-08-20 LAB — CBC WITH DIFFERENTIAL/PLATELET
Abs Immature Granulocytes: 0.01 10*3/uL (ref 0.00–0.07)
Basophils Absolute: 0.1 10*3/uL (ref 0.0–0.1)
Basophils Relative: 1 %
Eosinophils Absolute: 0.2 10*3/uL (ref 0.0–0.5)
Eosinophils Relative: 4 %
HCT: 27.9 % — ABNORMAL LOW (ref 39.0–52.0)
Hemoglobin: 10.2 g/dL — ABNORMAL LOW (ref 13.0–17.0)
Immature Granulocytes: 0 %
Lymphocytes Relative: 14 %
Lymphs Abs: 0.9 10*3/uL (ref 0.7–4.0)
MCH: 30.7 pg (ref 26.0–34.0)
MCHC: 36.6 g/dL — ABNORMAL HIGH (ref 30.0–36.0)
MCV: 84 fL (ref 80.0–100.0)
Monocytes Absolute: 0.1 10*3/uL (ref 0.1–1.0)
Monocytes Relative: 2 %
Neutro Abs: 4.9 10*3/uL (ref 1.7–7.7)
Neutrophils Relative %: 79 %
Platelets: 345 10*3/uL (ref 150–400)
RBC: 3.32 MIL/uL — ABNORMAL LOW (ref 4.22–5.81)
RDW: 19.9 % — ABNORMAL HIGH (ref 11.5–15.5)
WBC: 6.2 10*3/uL (ref 4.0–10.5)
nRBC: 0.8 % — ABNORMAL HIGH (ref 0.0–0.2)

## 2020-08-20 LAB — C-REACTIVE PROTEIN
CRP: 0.6 mg/dL (ref <1.0)
CRP: 0.7 mg/dL (ref <1.0)

## 2020-08-20 LAB — COMPREHENSIVE METABOLIC PANEL
ALT: 21 U/L (ref 0–44)
AST: 49 U/L — ABNORMAL HIGH (ref 15–41)
Albumin: 4.3 g/dL (ref 3.5–5.0)
Alkaline Phosphatase: 51 U/L (ref 38–126)
Anion gap: 7 (ref 5–15)
BUN: 10 mg/dL (ref 6–20)
CO2: 23 mmol/L (ref 22–32)
Calcium: 9 mg/dL (ref 8.9–10.3)
Chloride: 106 mmol/L (ref 98–111)
Creatinine, Ser: 0.58 mg/dL — ABNORMAL LOW (ref 0.61–1.24)
GFR calc Af Amer: >60 mL/min (ref >60)
GFR calc non Af Amer: >60 mL/min (ref >60)
Glucose, Bld: 145 mg/dL — ABNORMAL HIGH (ref 70–99)
Potassium: 4.6 mmol/L (ref 3.5–5.1)
Sodium: 136 mmol/L (ref 135–145)
Total Bilirubin: 2.8 mg/dL — ABNORMAL HIGH (ref 0.3–1.2)
Total Protein: 7.2 g/dL (ref 6.5–8.1)

## 2020-08-20 LAB — GLUCOSE, CAPILLARY
Glucose-Capillary: 138 mg/dL — ABNORMAL HIGH (ref 70–99)
Glucose-Capillary: 139 mg/dL — ABNORMAL HIGH (ref 70–99)
Glucose-Capillary: 113 mg/dL — ABNORMAL HIGH (ref 70–99)
Glucose-Capillary: 99 mg/dL (ref 70–99)

## 2020-08-20 LAB — FERRITIN
Ferritin: 208 ng/mL (ref 24–336)
Ferritin: 307 ng/mL (ref 24–336)

## 2020-08-20 LAB — LACTIC ACID, PLASMA: Lactic Acid, Venous: 0.9 mmol/L (ref 0.5–1.9)

## 2020-08-20 MED ORDER — BARICITINIB 2 MG PO TABS
4.0000 mg | ORAL_TABLET | Freq: Every day | ORAL | Status: DC
  Administered 2020-08-20: 4 mg via ORAL
  Filled 2020-08-20: qty 2

## 2020-08-20 MED ORDER — ENOXAPARIN SODIUM 40 MG/0.4ML ~~LOC~~ SOLN
40.0000 mg | Freq: Every day | SUBCUTANEOUS | Status: DC
  Filled 2020-08-20: qty 0.4

## 2020-08-20 MED ORDER — ALBUTEROL SULFATE HFA 108 (90 BASE) MCG/ACT IN AERS
2.0000 | INHALATION_SPRAY | Freq: Two times a day (BID) | RESPIRATORY_TRACT | Status: DC
  Administered 2020-08-20 – 2020-08-21 (×3): 2 via RESPIRATORY_TRACT

## 2020-08-20 MED ORDER — ALBUTEROL SULFATE HFA 108 (90 BASE) MCG/ACT IN AERS: 2.0000 | INHALATION_SPRAY | RESPIRATORY_TRACT | Status: DC | PRN

## 2020-08-20 MED ORDER — SODIUM CHLORIDE 0.9 % IV SOLN: INTRAVENOUS | Status: DC

## 2020-08-20 MED ORDER — FOLIC ACID 1 MG PO TABS
1.0000 mg | ORAL_TABLET | Freq: Every day | ORAL | Status: DC
  Administered 2020-08-20 – 2020-08-21 (×2): 1 mg via ORAL
  Filled 2020-08-20 (×2): qty 1

## 2020-08-20 MED ORDER — MORPHINE SULFATE (PF) 2 MG/ML IV SOLN: 1.0000 mg | INTRAVENOUS | Status: DC | PRN

## 2020-08-20 MED ORDER — HYDROXYUREA 500 MG PO CAPS
1500.0000 mg | ORAL_CAPSULE | Freq: Every day | ORAL | Status: DC
  Administered 2020-08-20 – 2020-08-21 (×2): 1500 mg via ORAL
  Filled 2020-08-20 (×2): qty 3

## 2020-08-20 MED ORDER — DEXAMETHASONE SODIUM PHOSPHATE 10 MG/ML IJ SOLN
6.0000 mg | INTRAMUSCULAR | Status: DC
  Administered 2020-08-20: 6 mg via INTRAVENOUS
  Filled 2020-08-20: qty 1

## 2020-08-20 MED ORDER — OXYCODONE HCL 5 MG PO TABS: 5.0000 mg | ORAL_TABLET | ORAL | Status: DC | PRN
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
## 2020-08-20 NOTE — ED Notes (Signed)
Called report to floor , spoke to Weston, Charity fundraiser and was informed that room is ready but no bed available  at this time. Will call back soon as bed is ready. Charge Nurse Jake aware.

## 2020-08-20 NOTE — Progress Notes (Signed)
PROGRESS NOTE    [name] Hill  YQM:[id number] DOB: Aug 20, 2001 DOA: 08/19/2020 PCP: Kallie Locks, FNP    Brief Narrative:  Patient admitted to the hospital with the working diagnosis of acute hypoxic respiratory failure due to SARS COVID-19 viral pneumonia.  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  Symptoms ongoing for about 2 days.  On his initial physical examination his blood pressure 118/51, heart rate 90, temperature 38.1 C, respiratory rate 18, oxygen saturation 85%, his lungs had mild Rales bilaterally, no wheezing, heart S1-S2, present, tachycardic, abdomen soft nontender, no lower extremity edema. Sodium 136, potassium 4.1, chloride 103, bicarb 25, glucose 97, BUN 10, creatinine 0.83, white count 8.4, hemoglobin 9.3, hematocrit 25.9, platelets 361.  SARS COVID-19 positive. Chest radiograph with very faint bilateral interstitial infiltrates.  EKG 87 bpm, normal axis, normal intervals, sinus rhythm with poor R wave progression, no ST segment or T wave changes.   Assessment & Plan:   Principal Problem:   Pneumonia due to COVID-19 virus Active Problems:   Hb-SS disease with vaso-occlusive crisis (HCC)   Hypoxia   Chronic pain syndrome   Chronic, continuous use of opioids   SOB (shortness of breath)   Respiratory failure with hypoxia (HCC)   1. Acute hypoxemic respiratory failure due to SARS COVID 19 viral pneumonia.   RR: 16  Pulse oxymetry: 94%  Fi02: 21% RA  COVID-19 Labs  Recent Labs    08/19/20 2127 08/19/20 2220 08/19/20 2308 08/20/20 0846  DDIMER 2.57*  --   --   --   FERRITIN  --  208  --  307  LDH  --   --  448*  --   CRP  --  0.6  --  0.7    Lab Results  Component Value Date   SARSCOV2NAA POSITIVE (A) 08/19/2020   SARSCOV2NAA NEGATIVE 01/30/2020   SARSCOV2NAA NEGATIVE 11/20/2019    Inflammatory markers are stable, decreased oxygen requirements.   Continue medical therapy with remdesivir #2/5 and systemic steroids with dexamethasone. Continue with as needed bronchodilators.   If patient continue to improve, possible candidate to complete remdesivir infusion as outpatient.   2. Sickle cell. Patient with no pain, or clinical signs of hemolysis, will continue home regimen with hydroxyurea. No clinical signs of exacerbation or crisis.   3. Chronic pain syndrome. Continue with current analgesics regimen.    Status is: Inpatient  Remains inpatient appropriate because:Inpatient level of care appropriate due to severity of illness   Dispo: The patient is from: Home              Anticipated d/c is to: Home              Anticipated d/c date is: 2 days              Patient currently is not medically stable to d/c.   DVT prophylaxis: Enoxaparin   Code Status:   full  Family Communication:  No family at the bedside      Subjective: Patient is feeling better but not yet back to baseline, no nausea oro vomiting, no chest pain. Dyspnea is improving.   Objective: Vitals:   08/20/20 0123 08/20/20 0530 08/20/20 0849 08/20/20 0929  BP: (!) 118/51 114/66  125/62  Pulse: 90 67  77  Resp: 18 18  16   Temp: (!) 100.5 F (38.1 C) 97.9 F (36.6 C)  98.5 F (36.9 C)  TempSrc: Oral Oral  Oral  SpO2: (!) 85% 93% 94% 91%  Weight:      Height:        Intake/Output Summary (Last 24 hours) at 08/20/2020 1023 Last data filed at 08/20/2020 0900 Gross per 24 hour  Intake 1175.78 ml  Output -  Net 1175.78 ml   Filed Weights   08/19/20 2052  Weight: 99.8 kg    Examination:   General: Not in pain or dyspnea, deconditioned  Neurology: Awake and alert, non focal  E ENT: no pallor, no icterus, oral mucosa moist Cardiovascular: No JVD. S1-S2 present, rhythmic, no gallops, rubs, or murmurs. No lower extremity edema. Pulmonary: positive breath sounds bilaterally, with no wheezing, rhonchi or rales. Gastrointestinal. Abdomen flat soft and non  tender.  Skin. No rashes Musculoskeletal: no joint deformities     Data Reviewed: I have personally reviewed following labs and imaging studies  CBC: Recent Labs  Lab 08/19/20 2308 08/20/20 0846  WBC 8.4 6.2  NEUTROABS 5.0 4.9  HGB 9.3* 10.2*  HCT 25.9* 27.9*  MCV 83.8 84.0  PLT 361 345   Basic Metabolic Panel: Recent Labs  Lab 08/19/20 2308 08/20/20 0846  NA 136 136  K 4.1 4.6  CL 103 106  CO2 25 23  GLUCOSE 97 145*  BUN 10 10  CREATININE 0.83 0.58*  CALCIUM 8.8* 9.0   GFR: Estimated Creatinine Clearance: 165.8 mL/min (A) (by C-G formula based on SCr of 0.58 mg/dL (L)). Liver Function Tests: Recent Labs  Lab 08/19/20 2308 08/20/20 0846  AST 41 49*  ALT 22 21  ALKPHOS 48 51  BILITOT 4.0* 2.8*  PROT 6.9 7.2  ALBUMIN 4.1 4.3   No results for input(s): LIPASE, AMYLASE in the last 168 hours. No results for input(s): AMMONIA in the last 168 hours. Coagulation Profile: No results for input(s): INR, PROTIME in the last 168 hours. Cardiac Enzymes: No results for input(s): CKTOTAL, CKMB, CKMBINDEX, TROPONINI in the last 168 hours. BNP (last 3 results) No results for input(s): PROBNP in the last 8760 hours. HbA1C: No results for input(s): HGBA1C in the last 72 hours. CBG: Recent Labs  Lab 08/20/20 0825  GLUCAP 138*   Lipid Profile: Recent Labs    08/19/20 2310  TRIG 80   Thyroid Function Tests: No results for input(s): TSH, T4TOTAL, FREET4, T3FREE, THYROIDAB in the last 72 hours. Anemia Panel: Recent Labs    08/19/20 2220 08/19/20 2308 08/20/20 0846  FERRITIN 208  --  307  RETICCTPCT  --  6.9*  --       Radiology Studies: I have reviewed all of the imaging during this hospital visit personally     Scheduled Meds: . albuterol  2 puff Inhalation BID  . baricitinib  4 mg Oral Daily  . folic acid  1 mg Oral Daily  . hydroxyurea  1,500 mg Oral Daily  . insulin aspart  0-20 Units Subcutaneous TID WC  . methylPREDNISolone (SOLU-MEDROL)  injection  1 mg/kg Intravenous Q12H   Followed by  . [START ON 08/23/2020] predniSONE  50 mg Oral Daily  . pantoprazole  40 mg Oral Daily  . senna-docusate  1 tablet Oral BID   Continuous Infusions: . sodium chloride 75 mL/hr at 08/20/20 0151  . remdesivir 100 mg in NS 100 mL 100 mg (08/20/20 0842)     LOS: 1 day        Mauricio Annett Gula, MD

## 2020-08-20 NOTE — Plan of Care (Signed)
  Problem: Safety: Goal: Ability to remain free from injury will improve Outcome: Progressing   

## 2020-08-21 ENCOUNTER — Inpatient Hospital Stay (HOSPITAL_COMMUNITY): Payer: Medicaid Other

## 2020-08-21 DIAGNOSIS — R609 Edema, unspecified: Secondary | ICD-10-CM

## 2020-08-21 LAB — COMPREHENSIVE METABOLIC PANEL
ALT: 24 U/L (ref 0–44)
AST: 44 U/L — ABNORMAL HIGH (ref 15–41)
Albumin: 4.9 g/dL (ref 3.5–5.0)
Alkaline Phosphatase: 62 U/L (ref 38–126)
Anion gap: 10 (ref 5–15)
BUN: 14 mg/dL (ref 6–20)
CO2: 24 mmol/L (ref 22–32)
Calcium: 9.8 mg/dL (ref 8.9–10.3)
Chloride: 104 mmol/L (ref 98–111)
Creatinine, Ser: 0.69 mg/dL (ref 0.61–1.24)
GFR calc Af Amer: >60 mL/min (ref >60)
GFR calc non Af Amer: >60 mL/min (ref >60)
Glucose, Bld: 133 mg/dL — ABNORMAL HIGH (ref 70–99)
Potassium: 4.5 mmol/L (ref 3.5–5.1)
Sodium: 138 mmol/L (ref 135–145)
Total Bilirubin: 2.9 mg/dL — ABNORMAL HIGH (ref 0.3–1.2)
Total Protein: 8 g/dL (ref 6.5–8.1)

## 2020-08-21 LAB — GLUCOSE, CAPILLARY
Glucose-Capillary: 121 mg/dL — ABNORMAL HIGH (ref 70–99)
Glucose-Capillary: 120 mg/dL — ABNORMAL HIGH (ref 70–99)
Glucose-Capillary: 94 mg/dL (ref 70–99)
Glucose-Capillary: 91 mg/dL (ref 70–99)

## 2020-08-21 LAB — C-REACTIVE PROTEIN: CRP: 0.6 mg/dL (ref <1.0)

## 2020-08-21 LAB — FERRITIN: Ferritin: 448 ng/mL — ABNORMAL HIGH (ref 24–336)

## 2020-08-21 LAB — D-DIMER, QUANTITATIVE: D-Dimer, Quant: 5.15 ug/mL-FEU — ABNORMAL HIGH (ref 0.00–0.50)

## 2020-08-21 MED ORDER — ENOXAPARIN SODIUM 100 MG/ML ~~LOC~~ SOLN
1.0000 mg/kg | Freq: Two times a day (BID) | SUBCUTANEOUS | Status: DC
  Administered 2020-08-21: 100 mg via SUBCUTANEOUS
  Filled 2020-08-21: qty 1

## 2020-08-21 MED ORDER — IOHEXOL 350 MG/ML SOLN
100.0000 mL | Freq: Once | INTRAVENOUS | Status: AC | PRN
  Administered 2020-08-21: 100 mL via INTRAVENOUS

## 2020-08-21 NOTE — Progress Notes (Signed)
Bilateral lower extremity venous duplex has been completed. Preliminary results can be found in CV Proc through chart review.   08/21/20 11:41 AM Olen Cordial RVT

## 2020-08-21 NOTE — Progress Notes (Signed)
PROGRESS NOTE    [name] Hill  ATF:[id number] DOB: March 23, 2001 DOA: 08/19/2020 PCP: Kallie Locks, FNP    Brief Narrative:  Patient admitted to the hospital with the working diagnosis of acute hypoxic respiratory failure due to SARS COVID-19 viral pneumonia.  18 year old male sickle cell disease who presents with fatigue, fever, chills, dry cough, myalgias, back pain, diarrhea and dyspnea.  Symptoms ongoing for about 2 days.  On his initial physical examination his blood pressure 118/51, heart rate 90, temperature 38.1 C, respiratory rate 18, oxygen saturation 85%, his lungs had mild Rales bilaterally, no wheezing, heart S1-S2, present, tachycardic, abdomen soft nontender, no lower extremity edema. Sodium 136, potassium 4.1, chloride 103, bicarb 25, glucose 97, BUN 10, creatinine 0.83, white count 8.4, hemoglobin 9.3, hematocrit 25.9, platelets 361.  SARS COVID-19 positive. Chest radiograph with very faint bilateral interstitial infiltrates.  EKG 87 bpm, normal axis, normal intervals, sinus rhythm with poor R wave progression, no ST segment or T wave changes.  Patient responding well to medical therapy. Very high elevation of D dimer.   Assessment & Plan:   Principal Problem:   Pneumonia due to COVID-19 virus Active Problems:   Hb-SS disease with vaso-occlusive crisis (HCC)   Hypoxia   Chronic pain syndrome   Chronic, continuous use of opioids   SOB (shortness of breath)   Respiratory failure with hypoxia (HCC)   1. Acute hypoxemic respiratory failure due to SARS COVID 19 viral pneumonia.   RR: 16  Pulse oxymetry: 93%  Fi02: 21% RA  COVID-19 Labs  Recent Labs    08/19/20 2127 08/19/20 2220 08/19/20 2308 08/20/20 0846 08/21/20 0511  DDIMER 2.57*  --   --   --  5.15*  FERRITIN  --  208  --  307 448*  LDH  --   --  448*  --   --   CRP  --  0.6  --  0.7 0.6    Lab Results  Component Value Date   SARSCOV2NAA POSITIVE (A) 08/19/2020   SARSCOV2NAA NEGATIVE  01/30/2020   SARSCOV2NAA NEGATIVE 11/20/2019    Patient with stable symptoms, worsening inflammatory markers, with D dimer up to 5,15.   Continue medical therapy with systemic steroids and remdesivir #3/5. As needed antitussive agents and bronchodilators.  Increase enoxaparin to therapeutic doses and further work up with lower extremities [place] and chest CT PE protocol.  Out of bed to chair tid with meals and ambulation with PT and OT.   2. Sickle cell disease. Continue close monitoring, no active crisis. Continue hydroxyurea per home regimen.   3. Chronic pain syndrome. Controlled.    4. Steroid induced hyperglycemia. Fasting glucose is 133, capillary 99, 121, 120. Patient is tolerating po well, will hold on further insulin therapy for now.   Status is: Inpatient  Remains inpatient appropriate because:IV treatments appropriate due to intensity of illness or inability to take PO   Dispo: The patient is from: Home              Anticipated d/c is to: Home              Anticipated d/c date is: 1 day              Patient currently is not medically stable to d/c.   DVT prophylaxis: Enoxaparin   Code Status:   full  Family Communication:  I spoke over the phone with the patient's grandmother about patient's  condition, plan of care, prognosis and all  questions were addressed.    Subjective: Patient with improving symptoms, not yet back to baseline, no nausea or vomiting, no chest pain or dyspnea.   Objective: Vitals:   08/20/20 1304 08/20/20 1946 08/20/20 2111 08/21/20 0512  BP: (!) 107/53  124/63 120/67  Pulse: 77  63 61  Resp: 16  16 16   Temp: 98.9 F (37.2 C)  98.8 F (37.1 C) 98.3 F (36.8 C)  TempSrc: Oral  Oral Oral  SpO2: 91% 91% 92% 93%  Weight:      Height:        Intake/Output Summary (Last 24 hours) at 08/21/2020 0950 Last data filed at 08/21/2020 08/23/2020 Gross per 24 hour  Intake 1560 ml  Output 0 ml  Net 1560 ml   Filed Weights   08/19/20 2052  Weight:  99.8 kg    Examination:   General: Not in pain or dyspnea, deconditioned  Neurology: Awake and alert, non focal  E ENT: no pallor, no icterus, oral mucosa moist Cardiovascular: No JVD. S1-S2 present, rhythmic, no gallops, rubs, or murmurs. No lower extremity edema. Pulmonary: positive breath sounds bilaterally,  Gastrointestinal. Abdomen soft and non tender Skin. No rashes Musculoskeletal: no joint deformities     Data Reviewed: I have personally reviewed following labs and imaging studies  CBC: Recent Labs  Lab 08/19/20 2308 08/20/20 0846  WBC 8.4 6.2  NEUTROABS 5.0 4.9  HGB 9.3* 10.2*  HCT 25.9* 27.9*  MCV 83.8 84.0  PLT 361 345   Basic Metabolic Panel: Recent Labs  Lab 08/19/20 2308 08/20/20 0846 08/21/20 0511  NA 136 136 138  K 4.1 4.6 4.5  CL 103 106 104  CO2 25 23 24   GLUCOSE 97 145* 133*  BUN 10 10 14   CREATININE 0.83 0.58* 0.69  CALCIUM 8.8* 9.0 9.8   GFR: Estimated Creatinine Clearance: 165.8 mL/min (by C-G formula based on SCr of 0.69 mg/dL). Liver Function Tests: Recent Labs  Lab 08/19/20 2308 08/20/20 0846 08/21/20 0511  AST 41 49* 44*  ALT 22 21 24   ALKPHOS 48 51 62  BILITOT 4.0* 2.8* 2.9*  PROT 6.9 7.2 8.0  ALBUMIN 4.1 4.3 4.9   No results for input(s): LIPASE, AMYLASE in the last 168 hours. No results for input(s): AMMONIA in the last 168 hours. Coagulation Profile: No results for input(s): INR, PROTIME in the last 168 hours. Cardiac Enzymes: No results for input(s): CKTOTAL, CKMB, CKMBINDEX, TROPONINI in the last 168 hours. BNP (last 3 results) No results for input(s): PROBNP in the last 8760 hours. HbA1C: No results for input(s): HGBA1C in the last 72 hours. CBG: Recent Labs  Lab 08/20/20 0825 08/20/20 1150 08/20/20 1631 08/20/20 2114 08/21/20 0733  GLUCAP 138* 139* 113* 99 121*   Lipid Profile: Recent Labs    08/19/20 2310  TRIG 80   Thyroid Function Tests: No results for input(s): TSH, T4TOTAL, FREET4, T3FREE,  THYROIDAB in the last 72 hours. Anemia Panel: Recent Labs    08/19/20 2220 08/19/20 2308 08/20/20 0846 08/21/20 0511  FERRITIN   < >  --  307 448*  RETICCTPCT  --  6.9*  --   --    < > = values in this interval not displayed.      Radiology Studies: I have reviewed all of the imaging during this hospital visit personally     Scheduled Meds: . albuterol  2 puff Inhalation BID  . dexamethasone (DECADRON) injection  6 mg Intravenous Q24H  . enoxaparin (LOVENOX) injection  40 mg Subcutaneous Daily  . folic acid  1 mg Oral Daily  . hydroxyurea  1,500 mg Oral Daily  . insulin aspart  0-20 Units Subcutaneous TID WC  . pantoprazole  40 mg Oral Daily  . senna-docusate  1 tablet Oral BID   Continuous Infusions: . remdesivir 100 mg in NS 100 mL Stopped (08/20/20 1012)     LOS: 2 days        Ilena Dieckman Annett Gula, MD

## 2020-08-21 NOTE — Progress Notes (Signed)
Patient refused stool softener, lovenox and protonix this morning. Will update MD.

## 2020-08-21 NOTE — Progress Notes (Signed)
Patient leaving AMA.  reviewed in-patient medical necessity for hospitalization.  Patient verbalized understanding, but declined to remain in-patient. On call/AC notified.  Will Escort off unit when transportation arrives

## 2020-08-22 ENCOUNTER — Telehealth: Payer: Self-pay

## 2020-08-22 ENCOUNTER — Telehealth: Payer: Self-pay | Admitting: Family Medicine

## 2020-08-22 ENCOUNTER — Telehealth (INDEPENDENT_AMBULATORY_CARE_PROVIDER_SITE_OTHER): Payer: Medicaid Other | Admitting: Family Medicine

## 2020-08-22 ENCOUNTER — Encounter: Payer: Self-pay | Admitting: Family Medicine

## 2020-08-22 DIAGNOSIS — F1190 Opioid use, unspecified, uncomplicated: Secondary | ICD-10-CM

## 2020-08-22 DIAGNOSIS — G894 Chronic pain syndrome: Secondary | ICD-10-CM

## 2020-08-22 DIAGNOSIS — Z09 Encounter for follow-up examination after completed treatment for conditions other than malignant neoplasm: Secondary | ICD-10-CM

## 2020-08-22 DIAGNOSIS — D571 Sickle-cell disease without crisis: Secondary | ICD-10-CM | POA: Diagnosis not present

## 2020-08-22 DIAGNOSIS — B342 Coronavirus infection, unspecified: Secondary | ICD-10-CM

## 2020-08-22 MED ORDER — BENZONATATE 100 MG PO CAPS: 100.0000 mg | ORAL_CAPSULE | Freq: Two times a day (BID) | ORAL | 0 refills | Status: DC | PRN

## 2020-08-22 MED ORDER — AMOXICILLIN-POT CLAVULANATE 875-125 MG PO TABS: 1.0000 | ORAL_TABLET | Freq: Two times a day (BID) | ORAL | 0 refills | Status: AC

## 2020-08-22 NOTE — Discharge Summary (Signed)
Physician Discharge Summary  [name] Hill:[id number] DOB: Mar 09, 2001 DOA: 08/19/2020  PCP: Kallie Locks, FNP  Admit date: 08/19/2020 Discharge date: 08/22/2020  Admitted From: home  Disposition:  Home   Recommendations for Outpatient Follow-up and new medication changes:   Patient left the hospital against medical advice.  I called [name] Hill, today, he is feeling well, I informed him about the results of CT chest and [place] lower extremities, I explained that he had a high d dimer and need to follow up with a repeat D dimer to confirm it is trending down. All questions were addressed.   Brief/Interim Summary: Patient admitted to the hospital with the working diagnosis of acute hypoxic respiratory failure due to SARS COVID-19 viral pneumonia.  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 Symptoms ongoing for about 2 days. On his initial physical examination his blood pressure 118/51, heart rate 90, temperature 38.1 C, respiratory rate 18, oxygen saturation85%,his lungs had mild Rales bilaterally, no wheezing, heart S1-S2, present, tachycardic, abdomen soft nontender, no lower extremity edema. Sodium 136, potassium 4.1, chloride 103, bicarb 25, glucose 97, BUN 10, creatinine 0.83, white count 8.4, hemoglobin 9.3, hematocrit 25.9, platelets 361. SARS COVID-19 positive. Chest radiograph with very faint bilateral interstitial infiltrates. EKG 87 bpm, normal axis, normal intervals, sinus rhythm with poor R wave progression,no ST segment or T wave changes.  Patient responding well to medical therapy. Very high elevation of D dimer.   Further work up with CT chest and lower extremity US negative for thromboembolism.   Medical therapy with systemic steroids and remdesivir #3/5. As needed antitussive agents and bronchodilators.  Increase enoxaparin to therapeutic doses and further work up with lower  extremities [place] and chest CT PE protocol.  Out of bed to chair tid with meals and ambulation with PT and OT.   2. Sickle cell disease. Continue close monitoring, no active crisis. Continue hydroxyurea per home regimen.   3. Chronic pain syndrome. Controlled.    4. Steroid induced hyperglycemia. Fasting glucose is 133, capillary 99, 121, 120. Patient is tolerating po well, will hold on further insulin therapy for now.    Discharge Diagnoses:  Principal Problem:   Pneumonia due to COVID-19 virus Active Problems:   Hb-SS disease with vaso-occlusive crisis (HCC)   Hypoxia   Chronic pain syndrome   Chronic, continuous use of opioids   SOB (shortness of breath)   Respiratory failure with hypoxia [facility])    Discharge Instructions   Allergies as of 08/21/2020      Reactions   Lisinopril Cough      Medication List    ASK your doctor about these medications   acetaminophen 500 MG tablet Commonly known as: TYLENOL Take 1,000 mg by mouth daily as needed (severe pain).   albuterol 108 (90 Base) MCG/ACT inhaler Commonly known as: VENTOLIN HFA Inhale 2 puffs into the lungs every 4 (four) hours as needed for wheezing or shortness of breath.   albuterol 108 (90 Base) MCG/ACT inhaler Commonly known as: VENTOLIN HFA Inhale 2 puffs into the lungs every 6 (six) hours as needed for wheezing or shortness of breath.   diphenhydrAMINE 25 mg capsule Commonly known as: Diphenhist Take 1 capsule (25 mg total) by mouth every 6 (six) hours as needed.   folic acid 1 MG tablet Commonly known as: FOLVITE Take 1 tablet (1 mg total) by mouth daily.   hydroxyurea 400 MG capsule Commonly  known as: DROXIA Take 4 capsules (1,600 mg total) by mouth daily.   ibuprofen 800 MG tablet Commonly known as: ADVIL Take 1 tablet (800 mg total) by mouth every 8 (eight) hours as needed.   losartan 50 MG tablet Commonly known as: COZAAR Take 50 mg by mouth daily.   methocarbamol 500 MG tablet Commonly  known as: ROBAXIN Take 1 tablet (500 mg total) by mouth 2 (two) times daily.   naproxen 500 MG tablet Commonly known as: NAPROSYN Take 1 tablet (500 mg total) by mouth 2 (two) times daily.   Oxycodone HCl 10 MG Tabs Take 1 tablet (10 mg total) by mouth every 4 (four) hours as needed (pain).       Allergies  Allergen Reactions  . Lisinopril Cough     Procedures/Studies: CT ANGIO CHEST PE W OR WO CONTRAST  Result Date: 08/21/2020 CLINICAL DATA:  COVID-19 positive. High probability of pulmonary embolus. EXAM: CT ANGIOGRAPHY CHEST WITH CONTRAST TECHNIQUE: Multidetector CT imaging of the chest was performed using the standard protocol during bolus administration of intravenous contrast. Multiplanar CT image reconstructions and MIPs were obtained to evaluate the vascular anatomy. CONTRAST:  OMNIPAQUE IOHEXOL 350 MG/ML SOLN COMPARISON:  Apr 01, 2020. FINDINGS: Cardiovascular: Satisfactory opacification of the pulmonary arteries to the segmental level. No evidence of pulmonary embolism. Mild cardiomegaly is noted. No pericardial effusion. Mediastinum/Nodes: No enlarged mediastinal, hilar, or axillary lymph nodes. Thyroid gland, trachea, and esophagus demonstrate no significant findings. Lungs/Pleura: Lungs are clear. No pleural effusion or pneumothorax. Upper Abdomen: No acute abnormality. Musculoskeletal: Diffuse sclerosis is noted consistent with history of sickle cell disease. Review of the MIP images confirms the above findings. IMPRESSION: 1. No definite evidence of pulmonary embolus. 2. Mild cardiomegaly. 3. Diffuse sclerosis is noted consistent with history of sickle cell disease. Electronically Signed   By: Lupita Raider M.D.   On: 08/21/2020 18:04   DG Chest Port 1 View  Result Date: 08/19/2020 CLINICAL DATA:  COVID like symptoms. EXAM: PORTABLE CHEST 1 VIEW COMPARISON:  January 30, 2020 FINDINGS: The heart is enlarged. The main pulmonary artery is again dilated. The lung volumes are  somewhat low. The lungs are mostly clear with some atelectasis at the lung bases. There is no acute osseous abnormality. There is no pneumothorax. IMPRESSION: 1. Low lung volumes with bibasilar atelectasis. 2. Cardiomegaly. 3. Dilated main pulmonary artery can be seen in patients with elevated pulmonary artery pressures. Electronically Signed   By: Katherine Mantle M.D.   On: 08/19/2020 21:55   VAS [place] LOWER EXTREMITY VENOUS (DVT)  Result Date: 08/21/2020  Lower Venous DVT Study Indications: Edema.  Risk Factors: COVID 19 positive. Comparison Study: No prior studies. Performing Technologist: Chanda Busing RVT  Examination Guidelines: A complete evaluation includes B-mode imaging, spectral Doppler, color Doppler, and power Doppler as needed of all accessible portions of each vessel. Bilateral testing is considered an integral part of a complete examination. Limited examinations for reoccurring indications may be performed as noted. The reflux portion of the exam is performed with the patient in reverse Trendelenburg.  +---------+---------------+---------+-----------+----------+--------------+ RIGHT    CompressibilityPhasicitySpontaneityPropertiesThrombus Aging +---------+---------------+---------+-----------+----------+--------------+ CFV      Full           Yes      Yes                                 +---------+---------------+---------+-----------+----------+--------------+ SFJ  Full                                                        +---------+---------------+---------+-----------+----------+--------------+ FV Prox  Full                                                        +---------+---------------+---------+-----------+----------+--------------+ FV Mid   Full                                                        +---------+---------------+---------+-----------+----------+--------------+ FV DistalFull                                                         +---------+---------------+---------+-----------+----------+--------------+ PFV      Full                                                        +---------+---------------+---------+-----------+----------+--------------+ POP      Full           Yes      Yes                                 +---------+---------------+---------+-----------+----------+--------------+ PTV      Full                                                        +---------+---------------+---------+-----------+----------+--------------+ PERO     Full                                                        +---------+---------------+---------+-----------+----------+--------------+   +---------+---------------+---------+-----------+----------+--------------+ LEFT     CompressibilityPhasicitySpontaneityPropertiesThrombus Aging +---------+---------------+---------+-----------+----------+--------------+ CFV      Full           Yes      Yes                                 +---------+---------------+---------+-----------+----------+--------------+ SFJ      Full                                                        +---------+---------------+---------+-----------+----------+--------------+  FV Prox  Full                                                        +---------+---------------+---------+-----------+----------+--------------+ FV Mid   Full                                                        +---------+---------------+---------+-----------+----------+--------------+ FV DistalFull                                                        +---------+---------------+---------+-----------+----------+--------------+ PFV      Full                                                        +---------+---------------+---------+-----------+----------+--------------+ POP      Full           Yes      Yes                                  +---------+---------------+---------+-----------+----------+--------------+ PTV      Full                                                        +---------+---------------+---------+-----------+----------+--------------+ PERO     Full                                                        +---------+---------------+---------+-----------+----------+--------------+     Summary: RIGHT: - There is no evidence of deep vein thrombosis in the lower extremity.  - No cystic structure found in the popliteal fossa.  LEFT: - There is no evidence of deep vein thrombosis in the lower extremity.  - No cystic structure found in the popliteal fossa.  *See table(s) above for measurements and observations. Electronically signed by Fabienne Bruns MD on 08/21/2020 at 2:52:17 PM.    Final         Discharge Exam: Vitals:   08/21/20 1606 08/21/20 1945  BP: (!) 115/47 (!) 116/59  Pulse: 66 61  Resp: 20 18  Temp: 98.2 F (36.8 C) 98.6 F (37 C)  SpO2: 97% 97%   Vitals:   08/20/20 2111 08/21/20 0512 08/21/20 1606 08/21/20 1945  BP: 124/63 120/67 (!) 115/47 (!) 116/59  Pulse: 63 61 66 61  Resp: Temp: 98.8 F (37.1 C) 98.3 F (36.8 C) 98.2 F (36.8 C) 98.6 F (37 C)  TempSrc:  Oral Oral    SpO2: 92% 93% 97% 97%  Weight:      Height:         The results of significant diagnostics from this hospitalization (including imaging, microbiology, ancillary and laboratory) are listed below for reference.     Microbiology: Recent Results (from the past 240 hour(s))  Respiratory Panel by RT PCR (Flu A&B, Covid) - Nasopharyngeal Swab     Status: Abnormal   Collection Time: 08/19/20  9:00 PM   Specimen: Nasopharyngeal Swab  Result Value Ref Range Status   SARS Coronavirus 2 by RT PCR POSITIVE (A) NEGATIVE Final    Comment: RESULT CALLED TO, READ BACK BY AND VERIFIED WITH: K.ALBRIDGEE,RN [id number] @2308  BY V.WILKINS (NOTE) SARS-CoV-2 target nucleic acids are DETECTED.  SARS-CoV-2 RNA is  generally detectable in upper respiratory specimens  during the acute phase of infection. Positive results are indicative of the presence of the identified virus, but do not rule out bacterial infection or co-infection with other pathogens not detected by the test. Clinical correlation with patient history and other diagnostic information is necessary to determine patient infection status. The expected result is Negative.  Fact Sheet for Patients:   Fact Sheet for Healthcare Providers: https://www.moore.com/  This test is not yet approved or cleared by the https://www.young.biz/ FDA and  has been authorized for detection and/or diagnosis of SARS-CoV-2 by FDA under an Emergency Use Authorization (EUA).  This EUA will remain in effect (meaning this test c an be used) for the duration of  the COVID-19 declaration under Section 564(b)(1) of the Act, 21 U.S.C. section 360bbb-3(b)(1), unless the authorization is terminated or revoked sooner.      Influenza A by PCR NEGATIVE NEGATIVE Final   Influenza B by PCR NEGATIVE NEGATIVE Final    Comment: (NOTE) The Xpert Xpress SARS-CoV-2/FLU/RSV assay is intended as an aid in  the diagnosis of influenza from Nasopharyngeal swab specimens and  should not be used as a sole basis for treatment. Nasal washings and  aspirates are unacceptable for Xpert Xpress SARS-CoV-2/FLU/RSV  testing.  Fact Sheet for Patients: [place]  Fact Sheet for Healthcare Providers: https://www.moore.com/  This test is not yet approved or cleared by the https://www.young.biz/ FDA and  has been authorized for detection and/or diagnosis of SARS-CoV-2 by  FDA under an Emergency Use Authorization (EUA). This EUA will remain  in effect (meaning this test can be used) for the duration of the  Covid-19 declaration under Section 564(b)(1) of the Act, 21  U.S.C. section  360bbb-3(b)(1), unless the authorization is  terminated or revoked. Performed at [facility], [address], [place], [place]   Blood Culture (routine x 2)     Status: None (Preliminary result)   Collection Time: 08/19/20 10:20 PM   Specimen: BLOOD  Result Value Ref Range Status   Specimen Description   Final    BLOOD LEFT ANTECUBITAL Performed at [facility], [address] [place], [place]    Special Requests   Final    BOTTLES DRAWN AEROBIC AND ANAEROBIC Blood Culture adequate volume Performed at [facility], [address] [place], [place]    Culture   Final    NO GROWTH 2 DAYS Performed at [facility], [address], [place], [place]    Report Status PENDING  Incomplete  Blood Culture (routine x 2)     Status: None (Preliminary result)   Collection  Time: 08/19/20 10:20 PM   Specimen: BLOOD  Result Value Ref Range Status   Specimen Description   Final    BLOOD RIGHT ANTECUBITAL Performed at [facility], [address] [place], [place] [address]    Special Requests   Final    BOTTLES DRAWN AEROBIC AND ANAEROBIC Blood Culture adequate volume Performed at [facility], [address], [place], [place] [address]    Culture   Final    NO GROWTH 2 DAYS Performed at [facility], [address], [place], [place] [address]    Report Status PENDING  Incomplete     Labs: BNP (last 3 results) No results for input(s): BNP in the last 8760 hours. Basic Metabolic Panel: Recent Labs  Lab 08/19/20 2308 08/20/20 0846 08/21/20 0511  NA 136 136 138  K 4.1 4.6 4.5  CL 103 106 104  CO2 25 23 24   GLUCOSE 97 145* 133*  BUN 10 10 14   CREATININE 0.83 0.58* 0.69  CALCIUM 8.8* 9.0 9.8   Liver Function Tests: Recent Labs  Lab 08/19/20 2308 08/20/20 0846 08/21/20 0511  AST 41 49* 44*  ALT 22 21 24   ALKPHOS 48 51 62   BILITOT 4.0* 2.8* 2.9*  PROT 6.9 7.2 8.0  ALBUMIN 4.1 4.3 4.9   No results for input(s): LIPASE, AMYLASE in the last 168 hours. No results for input(s): AMMONIA in the last 168 hours. CBC: Recent Labs  Lab 08/19/20 2308 08/20/20 0846  WBC 8.4 6.2  NEUTROABS 5.0 4.9  HGB 9.3* 10.2*  HCT 25.9* 27.9*  MCV 83.8 84.0  PLT 361 345   Cardiac Enzymes: No results for input(s): CKTOTAL, CKMB, CKMBINDEX, TROPONINI in the last 168 hours. BNP: Invalid input(s): POCBNP CBG: Recent Labs  Lab 08/20/20 2114 08/21/20 0733 08/21/20 1140 08/21/20 1607 08/21/20 1947  GLUCAP 99 121* 120* 94 91   D-Dimer Recent Labs    08/19/20 2127 08/21/20 0511  DDIMER 2.57* 5.15*   Hgb A1c No results for input(s): HGBA1C in the last 72 hours. Lipid Profile Recent Labs    08/19/20 2310  TRIG 80   Thyroid function studies No results for input(s): TSH, T4TOTAL, T3FREE, THYROIDAB in the last 72 hours.  Invalid input(s): FREET3 Anemia work up 2128    08/19/20 2220 08/19/20 2308 08/20/20 0846 08/21/20 0511  FERRITIN   < >  --  307 448*  RETICCTPCT  --  6.9*  --   --    < > = values in this interval not displayed.   Urinalysis    Component Value Date/Time   COLORURINE YELLOW 01/31/2020 0126   APPEARANCEUR CLEAR 01/31/2020 0126   LABSPEC 1.011 01/31/2020 0126   PHURINE 6.0 01/31/2020 0126   GLUCOSEU NEGATIVE 01/31/2020 0126   HGBUR MODERATE (A) 01/31/2020 0126   BILIRUBINUR Negative 02/11/2020 1132   KETONESUR NEGATIVE 01/31/2020 0126   PROTEINUR Positive (A) 02/11/2020 1132   PROTEINUR 100 (A) 01/31/2020 0126   UROBILINOGEN 0.2 02/11/2020 1132   UROBILINOGEN 2.0 (H) 07/07/2015 0738   NITRITE Negative 02/11/2020 1132   NITRITE NEGATIVE 01/31/2020 0126   LEUKOCYTESUR Negative 02/11/2020 1132   LEUKOCYTESUR NEGATIVE 01/31/2020 0126   Sepsis Labs Invalid input(s): PROCALCITONIN,  WBC,  LACTICIDVEN Microbiology Recent Results (from the past 240 hour(s))  Respiratory  Panel by RT PCR (Flu A&B, Covid) - Nasopharyngeal Swab     Status: Abnormal   Collection Time: 08/19/20  9:00 PM   Specimen: Nasopharyngeal Swab  Result Value Ref Range Status   SARS Coronavirus 2 by RT PCR POSITIVE (A) NEGATIVE Final    Comment: RESULT CALLED TO, READ BACK BY AND VERIFIED WITH: K.ALBRIDGEE,RN [id number] @2308  BY V.WILKINS (NOTE) SARS-CoV-2 target nucleic acids are DETECTED.  SARS-CoV-2 RNA is generally detectable in upper respiratory specimens  during the acute phase of infection. Positive results are indicative of the presence of the identified virus, but do not rule out bacterial infection or co-infection with other pathogens not detected by the test. Clinical correlation with patient history and other diagnostic information is necessary to determine patient infection status. The expected result is Negative.  Fact Sheet for Patients:   Fact Sheet for Healthcare Providers: https://www.moore.com/  This test is not yet approved or cleared by the https://www.young.biz/ FDA and  has been authorized for detection and/or diagnosis of SARS-CoV-2 by FDA under an Emergency Use Authorization (EUA).  This EUA will remain in effect (meaning this test c an be used) for the duration of  the COVID-19 declaration under Section 564(b)(1) of the Act, 21 U.S.C. section 360bbb-3(b)(1), unless the authorization is terminated or revoked sooner.      Influenza A by PCR NEGATIVE NEGATIVE Final   Influenza B by PCR NEGATIVE NEGATIVE Final    Comment: (NOTE) The Xpert Xpress SARS-CoV-2/FLU/RSV assay is intended as an aid in  the diagnosis of influenza from Nasopharyngeal swab specimens and  should not be used as a sole basis for treatment. Nasal washings and  aspirates are unacceptable for Xpert Xpress SARS-CoV-2/FLU/RSV  testing.  Fact Sheet for Patients: [place]  Fact Sheet for Healthcare  Providers: https://www.moore.com/  This test is not yet approved or cleared by the https://www.young.biz/ FDA and  has been authorized for detection and/or diagnosis of SARS-CoV-2 by  FDA under an Emergency Use Authorization (EUA). This EUA will remain  in effect (meaning this test can be used) for the duration of the  Covid-19 declaration under Section 564(b)(1) of the Act, 21  U.S.C. section 360bbb-3(b)(1), unless the authorization is  terminated or revoked. Performed at [facility], [address] [place], [place]   Blood Culture (routine x 2)     Status: None (Preliminary result)   Collection Time: 08/19/20 10:20 PM   Specimen: BLOOD  Result Value Ref Range Status   Specimen Description   Final    BLOOD LEFT ANTECUBITAL Performed at [facility], [address], [place], [place]    Special Requests   Final    BOTTLES DRAWN AEROBIC AND ANAEROBIC Blood Culture adequate volume Performed at [facility], [address], [place], [place]    Culture   Final    NO GROWTH 2 DAYS Performed at [facility], [address], [place], [place]    Report Status PENDING  Incomplete  Blood Culture (routine x 2)     Status: None (Preliminary result)   Collection Time: 08/19/20 10:20 PM   Specimen: BLOOD  Result Value Ref Range Status   Specimen Description   Final    BLOOD RIGHT ANTECUBITAL Performed at [facility], [address] [place], [place]    Special Requests   Final    BOTTLES DRAWN AEROBIC AND ANAEROBIC Blood Culture adequate volume Performed at [facility], [address], [place], [place]    Culture   Final    NO GROWTH 2 DAYS Performed at [facility], [address] [place], [place] [address]    Report Status PENDING  Incomplete     Time coordinating discharge: 45  minutes  SIGNED:   Coralie KeensMauricio Daniel Hashem Goynes, MD  [facility] 08/22/2020, 4:30 PM

## 2020-08-22 NOTE — Telephone Encounter (Signed)
Patient had questions about steriod medication and follow up appointment. Patient was instructed by hospital to follow up with PCP.   Transition Care Management Follow-up Telephone Call  Date of discharge and from where: 08/21/2020 from [facility]  How have you been since you were released from the hospital?   Any questions or concerns? No  Items Reviewed:  Did the pt receive and understand the discharge instructions provided? Yes   Medications obtained and verified? Yes   Any new allergies since your discharge? Yes   Dietary orders reviewed? Yes   Do you have support at home? Yes   Functional Questionnaire: (I = Independent and D = Dependent) ADLs: I Bathing/Dressing- I Meal Prep- I Eating- I Maintaining continence- I Transferring/Ambulation- I Managing Meds- I  Follow up appointments reviewed:   PCP Hospital f/u appt confirmed? Message sent to PCP   Are transportation arrangements needed? Yes   If their condition worsens, is the pt aware to call PCP or go to the Emergency Dept.? Yes   Was the patient provided with contact information for the PCP's office or ED? Yes Was to pt encouraged to call back with questions or concerns? Yes
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
## 2020-08-23 ENCOUNTER — Inpatient Hospital Stay (HOSPITAL_COMMUNITY)
Admission: EM | Admit: 2020-08-23 | Discharge: 2020-08-27 | DRG: 811 | Disposition: A | Discharge status: Home / Self Care | Payer: Medicaid Other | Attending: Internal Medicine | Admitting: Internal Medicine

## 2020-08-23 ENCOUNTER — Emergency Department (HOSPITAL_COMMUNITY): Payer: Medicaid Other

## 2020-08-23 ENCOUNTER — Other Ambulatory Visit: Payer: Self-pay

## 2020-08-23 ENCOUNTER — Encounter (HOSPITAL_COMMUNITY): Payer: Self-pay

## 2020-08-23 DIAGNOSIS — Z79899 Other long term (current) drug therapy: Secondary | ICD-10-CM | POA: Diagnosis not present

## 2020-08-23 DIAGNOSIS — F1120 Opioid dependence, uncomplicated: Secondary | ICD-10-CM | POA: Diagnosis present

## 2020-08-23 DIAGNOSIS — D5700 Hb-SS disease with crisis, unspecified: Secondary | ICD-10-CM | POA: Diagnosis not present

## 2020-08-23 DIAGNOSIS — U071 COVID-19: Secondary | ICD-10-CM

## 2020-08-23 DIAGNOSIS — J1282 Pneumonia due to coronavirus disease 2019: Secondary | ICD-10-CM | POA: Diagnosis present

## 2020-08-23 DIAGNOSIS — D72829 Elevated white blood cell count, unspecified: Secondary | ICD-10-CM | POA: Diagnosis not present

## 2020-08-23 DIAGNOSIS — Z888 Allergy status to other drugs, medicaments and biological substances status: Secondary | ICD-10-CM | POA: Diagnosis not present

## 2020-08-23 DIAGNOSIS — Z8249 Family history of ischemic heart disease and other diseases of the circulatory system: Secondary | ICD-10-CM | POA: Diagnosis not present

## 2020-08-23 DIAGNOSIS — Z832 Family history of diseases of the blood and blood-forming organs and certain disorders involving the immune mechanism: Secondary | ICD-10-CM | POA: Diagnosis not present

## 2020-08-23 DIAGNOSIS — Z83438 Family history of other disorder of lipoprotein metabolism and other lipidemia: Secondary | ICD-10-CM

## 2020-08-23 DIAGNOSIS — R079 Chest pain, unspecified: Secondary | ICD-10-CM | POA: Diagnosis not present

## 2020-08-23 DIAGNOSIS — G894 Chronic pain syndrome: Secondary | ICD-10-CM | POA: Diagnosis present

## 2020-08-23 DIAGNOSIS — R0902 Hypoxemia: Secondary | ICD-10-CM | POA: Diagnosis present

## 2020-08-23 DIAGNOSIS — I10 Essential (primary) hypertension: Secondary | ICD-10-CM | POA: Diagnosis present

## 2020-08-23 DIAGNOSIS — D638 Anemia in other chronic diseases classified elsewhere: Secondary | ICD-10-CM | POA: Diagnosis present

## 2020-08-23 LAB — CBC WITH DIFFERENTIAL/PLATELET
Abs Immature Granulocytes: 0.07 10*3/uL (ref 0.00–0.07)
Basophils Absolute: 0.2 10*3/uL — ABNORMAL HIGH (ref 0.0–0.1)
Basophils Relative: 1 %
Eosinophils Absolute: 0.1 10*3/uL (ref 0.0–0.5)
Eosinophils Relative: 1 %
HCT: 27.6 % — ABNORMAL LOW (ref 39.0–52.0)
Hemoglobin: 10 g/dL — ABNORMAL LOW (ref 13.0–17.0)
Immature Granulocytes: 1 %
Lymphocytes Relative: 52 %
Lymphs Abs: 6.7 10*3/uL — ABNORMAL HIGH (ref 0.7–4.0)
MCH: 29.8 pg (ref 26.0–34.0)
MCHC: 36.2 g/dL — ABNORMAL HIGH (ref 30.0–36.0)
MCV: 82.1 fL (ref 80.0–100.0)
Monocytes Absolute: 1.6 10*3/uL — ABNORMAL HIGH (ref 0.1–1.0)
Monocytes Relative: 12 %
Neutro Abs: 4.3 10*3/uL (ref 1.7–7.7)
Neutrophils Relative %: 33 %
Platelets: 407 10*3/uL — ABNORMAL HIGH (ref 150–400)
RBC: 3.36 MIL/uL — ABNORMAL LOW (ref 4.22–5.81)
RDW: 18.6 % — ABNORMAL HIGH (ref 11.5–15.5)
WBC: 12.8 10*3/uL — ABNORMAL HIGH (ref 4.0–10.5)
nRBC: 3 % — ABNORMAL HIGH (ref 0.0–0.2)

## 2020-08-23 LAB — BASIC METABOLIC PANEL
Anion gap: 8 (ref 5–15)
BUN: 8 mg/dL (ref 6–20)
CO2: 26 mmol/L (ref 22–32)
Calcium: 8.5 mg/dL — ABNORMAL LOW (ref 8.9–10.3)
Chloride: 101 mmol/L (ref 98–111)
Creatinine, Ser: 0.53 mg/dL — ABNORMAL LOW (ref 0.61–1.24)
GFR calc Af Amer: >60 mL/min (ref >60)
GFR calc non Af Amer: >60 mL/min (ref >60)
Glucose, Bld: 90 mg/dL (ref 70–99)
Potassium: 4.8 mmol/L (ref 3.5–5.1)
Sodium: 135 mmol/L (ref 135–145)

## 2020-08-23 LAB — RETICULOCYTES
Immature Retic Fract: 34 % — ABNORMAL HIGH (ref 2.3–15.9)
RBC.: 3.26 MIL/uL — ABNORMAL LOW (ref 4.22–5.81)
Retic Count, Absolute: 105.6 10*3/uL (ref 19.0–186.0)
Retic Ct Pct: 3.2 % — ABNORMAL HIGH (ref 0.4–3.1)

## 2020-08-23 LAB — HEPATIC FUNCTION PANEL
ALT: 26 U/L (ref 0–44)
AST: 65 U/L — ABNORMAL HIGH (ref 15–41)
Albumin: 4.1 g/dL (ref 3.5–5.0)
Alkaline Phosphatase: 54 U/L (ref 38–126)
Bilirubin, Direct: 0.6 mg/dL — ABNORMAL HIGH (ref 0.0–0.2)
Indirect Bilirubin: 2.6 mg/dL — ABNORMAL HIGH (ref 0.3–0.9)
Total Bilirubin: 3.2 mg/dL — ABNORMAL HIGH (ref 0.3–1.2)
Total Protein: 6.6 g/dL (ref 6.5–8.1)

## 2020-08-23 MED ORDER — BENZONATATE 100 MG PO CAPS: 100.0000 mg | ORAL_CAPSULE | Freq: Two times a day (BID) | ORAL | Status: DC | PRN

## 2020-08-23 MED ORDER — HYDROMORPHONE HCL 2 MG/ML IJ SOLN
2.0000 mg | Freq: Once | INTRAMUSCULAR | Status: AC
  Administered 2020-08-23: 2 mg via INTRAVENOUS
  Filled 2020-08-23: qty 1

## 2020-08-23 MED ORDER — SODIUM CHLORIDE 0.9 % IV SOLN
25.0000 mg | INTRAVENOUS | Status: DC | PRN
  Filled 2020-08-23: qty 0.5

## 2020-08-23 MED ORDER — HYDROMORPHONE HCL 2 MG/ML IJ SOLN
2.0000 mg | INTRAMUSCULAR | Status: DC | PRN
  Administered 2020-08-23 – 2020-08-24 (×7): 2 mg via INTRAVENOUS
  Filled 2020-08-23 (×8): qty 1

## 2020-08-23 MED ORDER — HYDROXYUREA 500 MG PO CAPS
1500.0000 mg | ORAL_CAPSULE | Freq: Every day | ORAL | Status: DC
  Administered 2020-08-24 – 2020-08-27 (×4): 1500 mg via ORAL
  Filled 2020-08-23 (×4): qty 3

## 2020-08-23 MED ORDER — HYDROMORPHONE HCL 1 MG/ML IJ SOLN
1.0000 mg | Freq: Once | INTRAMUSCULAR | Status: AC
  Administered 2020-08-23: 1 mg via INTRAVENOUS
  Filled 2020-08-23: qty 1

## 2020-08-23 MED ORDER — SENNOSIDES-DOCUSATE SODIUM 8.6-50 MG PO TABS
1.0000 | ORAL_TABLET | Freq: Two times a day (BID) | ORAL | Status: DC
  Administered 2020-08-24 – 2020-08-27 (×6): 1 via ORAL
  Filled 2020-08-23 (×8): qty 1

## 2020-08-23 MED ORDER — DIPHENHYDRAMINE HCL 50 MG/ML IJ SOLN
25.0000 mg | Freq: Once | INTRAMUSCULAR | Status: AC
  Administered 2020-08-23: 25 mg via INTRAVENOUS
  Filled 2020-08-23: qty 1

## 2020-08-23 MED ORDER — ONDANSETRON HCL 4 MG/2ML IJ SOLN: 4.0000 mg | Freq: Four times a day (QID) | INTRAMUSCULAR | Status: DC | PRN

## 2020-08-23 MED ORDER — LOSARTAN POTASSIUM 50 MG PO TABS
50.0000 mg | ORAL_TABLET | Freq: Every day | ORAL | Status: DC
  Administered 2020-08-24 – 2020-08-27 (×4): 50 mg via ORAL
  Filled 2020-08-23 (×4): qty 1

## 2020-08-23 MED ORDER — NALOXONE HCL 0.4 MG/ML IJ SOLN: 0.4000 mg | INTRAMUSCULAR | Status: DC | PRN

## 2020-08-23 MED ORDER — HYDROMORPHONE 1 MG/ML IV SOLN
INTRAVENOUS | Status: DC
  Administered 2020-08-24: 1.5 mg via INTRAVENOUS
  Administered 2020-08-24: 1 mg via INTRAVENOUS
  Administered 2020-08-24: 30 mg via INTRAVENOUS
  Administered 2020-08-25: 3 mg via INTRAVENOUS
  Administered 2020-08-25: 0 mg via INTRAVENOUS
  Filled 2020-08-23: qty 30

## 2020-08-23 MED ORDER — FOLIC ACID 1 MG PO TABS
1.0000 mg | ORAL_TABLET | Freq: Every day | ORAL | Status: DC
  Administered 2020-08-23 – 2020-08-27 (×5): 1 mg via ORAL
  Filled 2020-08-23 (×5): qty 1

## 2020-08-23 MED ORDER — ENOXAPARIN SODIUM 40 MG/0.4ML ~~LOC~~ SOLN: 40.0000 mg | SUBCUTANEOUS | Status: DC

## 2020-08-23 MED ORDER — SODIUM CHLORIDE 0.9 % IV BOLUS
1000.0000 mL | Freq: Once | INTRAVENOUS | Status: AC
  Administered 2020-08-23: 1000 mL via INTRAVENOUS

## 2020-08-23 MED ORDER — METHOCARBAMOL 500 MG PO TABS
500.0000 mg | ORAL_TABLET | Freq: Two times a day (BID) | ORAL | Status: DC
  Administered 2020-08-23 – 2020-08-27 (×8): 500 mg via ORAL
  Filled 2020-08-23 (×8): qty 1

## 2020-08-23 MED ORDER — POLYETHYLENE GLYCOL 3350 17 G PO PACK
17.0000 g | PACK | Freq: Every day | ORAL | Status: DC | PRN
  Administered 2020-08-26: 17 g via ORAL
  Filled 2020-08-23: qty 1

## 2020-08-23 MED ORDER — SODIUM CHLORIDE 0.45 % IV SOLN: INTRAVENOUS | Status: AC

## 2020-08-23 MED ORDER — HYDROXYUREA 300 MG PO CAPS: 1600.0000 mg | ORAL_CAPSULE | Freq: Every day | ORAL | Status: DC

## 2020-08-23 MED ORDER — ONDANSETRON HCL 4 MG/2ML IJ SOLN
4.0000 mg | Freq: Once | INTRAMUSCULAR | Status: AC
  Administered 2020-08-23: 4 mg via INTRAVENOUS
  Filled 2020-08-23: qty 2

## 2020-08-23 MED ORDER — SODIUM CHLORIDE 0.9 % IV SOLN
100.0000 mg | Freq: Every day | INTRAVENOUS | Status: AC
  Administered 2020-08-23 – 2020-08-25 (×3): 100 mg via INTRAVENOUS
  Filled 2020-08-23 (×3): qty 20

## 2020-08-23 MED ORDER — KETOROLAC TROMETHAMINE 15 MG/ML IJ SOLN
15.0000 mg | Freq: Four times a day (QID) | INTRAMUSCULAR | Status: DC
  Administered 2020-08-23 – 2020-08-27 (×15): 15 mg via INTRAVENOUS
  Filled 2020-08-23 (×15): qty 1

## 2020-08-23 MED ORDER — DIPHENHYDRAMINE HCL 25 MG PO CAPS: 25.0000 mg | ORAL_CAPSULE | ORAL | Status: DC | PRN

## 2020-08-23 MED ORDER — ALBUTEROL SULFATE HFA 108 (90 BASE) MCG/ACT IN AERS: 2.0000 | INHALATION_SPRAY | RESPIRATORY_TRACT | Status: DC | PRN

## 2020-08-23 MED ORDER — ALBUTEROL SULFATE HFA 108 (90 BASE) MCG/ACT IN AERS: 2.0000 | INHALATION_SPRAY | Freq: Four times a day (QID) | RESPIRATORY_TRACT | Status: DC | PRN

## 2020-08-23 MED ORDER — SODIUM CHLORIDE 0.9% FLUSH: 9.0000 mL | INTRAVENOUS | Status: DC | PRN
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
## 2020-08-23 NOTE — Telephone Encounter (Signed)
Sent to provider 

## 2020-08-23 NOTE — Progress Notes (Signed)
PHARMACY - REMDESIVIR  19 yr male presents to ED with sickle cell pain crisis.  Patient recently diagnosed with Covid and was at [facility] 9/26 and left AMA on 9/28.  9/26 received Remdesivir 200mg  IV @ 0103 9/27 received Remdesivir 100mg  IV @ 1112  Upon return to [facility] ED today, pharmacy consulted to dose remdesivir for continuation of therapy.  Plan: Remdesivir 100mg  IV q24h x 3 doses  , PharmD 08/23/20 @ 13:42

## 2020-08-23 NOTE — ED Notes (Signed)
Pt is complaining of pain all over due to having sickle cell. Pt reports pain started last night. Pt is covid +
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
## 2020-08-24 DIAGNOSIS — R0902 Hypoxemia: Secondary | ICD-10-CM

## 2020-08-24 LAB — CBC
HCT: 25.2 % — ABNORMAL LOW (ref 39.0–52.0)
Hemoglobin: 9.3 g/dL — ABNORMAL LOW (ref 13.0–17.0)
MCH: 30.5 pg (ref 26.0–34.0)
MCHC: 36.9 g/dL — ABNORMAL HIGH (ref 30.0–36.0)
MCV: 82.6 fL (ref 80.0–100.0)
Platelets: 366 10*3/uL (ref 150–400)
RBC: 3.05 MIL/uL — ABNORMAL LOW (ref 4.22–5.81)
RDW: 19.5 % — ABNORMAL HIGH (ref 11.5–15.5)
WBC: 11.2 10*3/uL — ABNORMAL HIGH (ref 4.0–10.5)
nRBC: 4.3 % — ABNORMAL HIGH (ref 0.0–0.2)

## 2020-08-24 MED ORDER — ENOXAPARIN SODIUM 40 MG/0.4ML ~~LOC~~ SOLN
40.0000 mg | SUBCUTANEOUS | Status: DC
  Administered 2020-08-25 – 2020-08-26 (×2): 40 mg via SUBCUTANEOUS
  Filled 2020-08-24 (×4): qty 0.4

## 2020-08-24 MED ORDER — MORPHINE SULFATE (PF) 2 MG/ML IV SOLN
2.0000 mg | INTRAVENOUS | Status: DC | PRN
  Administered 2020-08-24 (×2): 2 mg via INTRAVENOUS
  Filled 2020-08-24 (×2): qty 1

## 2020-08-24 MED ORDER — OXYCODONE HCL 5 MG PO TABS: 15.0000 mg | ORAL_TABLET | ORAL | Status: DC | PRN

## 2020-08-24 MED ORDER — SODIUM CHLORIDE 0.9 % IV SOLN
INTRAVENOUS | Status: DC | PRN
  Administered 2020-08-24: 1000 mL via INTRAVENOUS
  Administered 2020-08-27: 250 mL via INTRAVENOUS

## 2020-08-24 NOTE — ED Notes (Signed)
Pt provided with 2 cran-grape juice and ice, per his request.  No other needs expressed, will continue to monitor.

## 2020-08-24 NOTE — ED Notes (Signed)
Pt placed into hospital bed for comfort measures. Given hygiene care products.

## 2020-08-24 NOTE — Progress Notes (Signed)
Subjective: [name] Hill is a 19 year old male with a medical history significant for sickle cell disease, chronic pain syndrome, opiate dependence and tolerance, hypertension, and anemia of chronic disease was admitted for COVID-19 pneumonia in the setting of sickle cell pain crisis.  Patient states that pain has not improved overnight.  He still describes pain is generalized and aching.  Pain is mostly to upper and lower extremities.  He rates pain as 8/10.  He states that morphine typically works better for him than Dilaudid.  On reviewing previous medical records, there is no history of morphine use. Currently, oxygen saturation is 94% on 2 L supplemental oxygen.  He denies shortness of breath, chest pain, dizziness, urinary symptoms, nausea, vomiting, or diarrhea.  Objective:  Vital signs in last 24 hours:  Vitals:   08/24/20 1200 08/24/20 1349 08/24/20 1400 08/24/20 1425  BP: (!) 105/52 (!) 105/52 119/62 119/62  Pulse: 70 75 73 73  Resp: 13 (!) 28 19 18   Temp:      TempSrc:      SpO2: 94% 98% 97% 98%  Weight:      Height:        Intake/Output from previous day:   Intake/Output Summary (Last 24 hours) at 08/24/2020 1642 Last data filed at 08/24/2020 1150 Gross per 24 hour  Intake 1200 ml  Output --  Net 1200 ml    Physical Exam: General: Alert, awake, oriented x3, in no acute distress.  HEENT: [place]/AT PEERL, EOMI Neck: Trachea midline,  no masses, no thyromegal,y no JVD, no carotid bruit OROPHARYNX:  Moist, No exudate/ erythema/lesions.  Heart: Regular rate and rhythm, without murmurs, rubs, gallops, PMI non-displaced, no heaves or thrills on palpation.  Lungs: Clear to auscultation, no wheezing or rhonchi noted. No increased vocal fremitus resonant to percussion  Abdomen: Soft, nontender, nondistended, positive bowel sounds, no masses no hepatosplenomegaly noted..  Neuro: No focal neurological deficits noted cranial nerves II through XII grossly intact. DTRs 2+ bilaterally  upper and lower extremities. Strength 5 out of 5 in bilateral upper and lower extremities. Musculoskeletal: No warm swelling or erythema around joints, no spinal tenderness noted. Psychiatric: Patient alert and oriented x3, good insight and cognition, good recent to remote recall. Lymph node survey: No cervical axillary or inguinal lymphadenopathy noted.  Lab Results:  Basic Metabolic Panel:    Component Value Date/Time   NA 135 08/23/2020 1021   NA 142 06/26/2018 1416   K 4.8 08/23/2020 1021   CL 101 08/23/2020 1021   CO2 26 08/23/2020 1021   BUN 8 08/23/2020 1021   BUN 7 06/26/2018 1416   CREATININE 0.53 (L) 08/23/2020 1021   GLUCOSE 90 08/23/2020 1021   CALCIUM 8.5 (L) 08/23/2020 1021   CBC:    Component Value Date/Time   WBC 11.2 (H) 08/24/2020 0316   HGB 9.3 (L) 08/24/2020 0316   HGB 9.6 (L) 06/26/2018 1416   HCT 25.2 (L) 08/24/2020 0316   HCT 28.5 (L) 06/26/2018 1416   PLT 366 08/24/2020 0316   PLT 405 06/26/2018 1416   MCV 82.6 08/24/2020 0316   MCV 92 06/26/2018 1416   NEUTROABS 4.3 08/23/2020 1021   NEUTROABS 3.7 06/26/2018 1416   LYMPHSABS 6.7 (H) 08/23/2020 1021   LYMPHSABS 3.3 (H) 06/26/2018 1416   MONOABS 1.6 (H) 08/23/2020 1021   EOSABS 0.1 08/23/2020 1021   EOSABS 0.2 06/26/2018 1416   BASOSABS 0.2 (H) 08/23/2020 1021   BASOSABS 0.1 06/26/2018 1416    Recent Results (from  the past 240 hour(s))  Respiratory Panel by RT PCR (Flu A&B, Covid) - Nasopharyngeal Swab     Status: Abnormal   Collection Time: 08/19/20  9:00 PM   Specimen: Nasopharyngeal Swab  Result Value Ref Range Status   SARS Coronavirus 2 by RT PCR POSITIVE (A) NEGATIVE Final    Comment: RESULT CALLED TO, READ BACK BY AND VERIFIED WITH: K.ALBRIDGEE,RN [id number] @2308  BY V.WILKINS (NOTE) SARS-CoV-2 target nucleic acids are DETECTED.  SARS-CoV-2 RNA is generally detectable in upper respiratory specimens  during the acute phase of infection. Positive results are indicative of the presence  of the identified virus, but do not rule out bacterial infection or co-infection with other pathogens not detected by the test. Clinical correlation with patient history and other diagnostic information is necessary to determine patient infection status. The expected result is Negative.  Fact Sheet for Patients:   Fact Sheet for Healthcare Providers: https://www.moore.com/  This test is not yet approved or cleared by the https://www.young.biz/ FDA and  has been authorized for detection and/or diagnosis of SARS-CoV-2 by FDA under an Emergency Use Authorization (EUA).  This EUA will remain in effect (meaning this test c an be used) for the duration of  the COVID-19 declaration under Section 564(b)(1) of the Act, 21 U.S.C. section 360bbb-3(b)(1), unless the authorization is terminated or revoked sooner.      Influenza A by PCR NEGATIVE NEGATIVE Final   Influenza B by PCR NEGATIVE NEGATIVE Final    Comment: (NOTE) The Xpert Xpress SARS-CoV-2/FLU/RSV assay is intended as an aid in  the diagnosis of influenza from Nasopharyngeal swab specimens and  should not be used as a sole basis for treatment. Nasal washings and  aspirates are unacceptable for Xpert Xpress SARS-CoV-2/FLU/RSV  testing.  Fact Sheet for Patients: [place]  Fact Sheet for Healthcare Providers: https://www.moore.com/  This test is not yet approved or cleared by the https://www.young.biz/ FDA and  has been authorized for detection and/or diagnosis of SARS-CoV-2 by  FDA under an Emergency Use Authorization (EUA). This EUA will remain  in effect (meaning this test can be used) for the duration of the  Covid-19 declaration under Section 564(b)(1) of the Act, 21  U.S.C. section 360bbb-3(b)(1), unless the authorization is  terminated or revoked. Performed at [facility], [address] [place],  [place]   Blood Culture (routine x 2)     Status: None (Preliminary result)   Collection Time: 08/19/20 10:20 PM   Specimen: BLOOD  Result Value Ref Range Status   Specimen Description   Final    BLOOD LEFT ANTECUBITAL Performed at [facility], [address], [place], [place]    Special Requests   Final    BOTTLES DRAWN AEROBIC AND ANAEROBIC Blood Culture adequate volume Performed at [facility], [address] [place], [place]    Culture   Final    NO GROWTH 4 DAYS Performed at [facility], [address], [place], [place]    Report Status PENDING  Incomplete  Blood Culture (routine x 2)     Status: None (Preliminary result)   Collection Time: 08/19/20 10:20 PM   Specimen: BLOOD  Result Value Ref Range Status   Specimen Description   Final    BLOOD RIGHT ANTECUBITAL Performed at [facility], [address], [place], [place]    Special Requests   Final    BOTTLES DRAWN  AEROBIC AND ANAEROBIC Blood Culture adequate volume Performed at [facility], [address], [place], [place] [address]    Culture   Final    NO GROWTH 4 DAYS Performed at [facility], [address], [place], [place] [address]    Report Status PENDING  Incomplete    Studies/Results: DG Chest Portable 1 View  Result Date: 08/23/2020 CLINICAL DATA:  Shortness of breath. EXAM: PORTABLE CHEST 1 VIEW COMPARISON:  August 19, 2020. FINDINGS: Stable cardiomediastinal silhouette. No pneumothorax or pleural effusion is noted. Both lungs are clear. The visualized skeletal structures are unremarkable. IMPRESSION: No active disease. Electronically Signed   By: Lupita Raider M.D.   On: 08/23/2020 10:57    Medications: Scheduled Meds: . enoxaparin (LOVENOX) injection  40 mg Subcutaneous Q24H  . folic acid  1 mg Oral Daily  . HYDROmorphone   Intravenous Q4H  . hydroxyurea   1,500 mg Oral Daily  . ketorolac  15 mg Intravenous Q6H  . losartan  50 mg Oral Daily  . methocarbamol  500 mg Oral BID  . senna-docusate  1 tablet Oral BID   Continuous Infusions: . sodium chloride 1,000 mL (08/24/20 1117)  . diphenhydrAMINE    . remdesivir 100 mg in NS 100 mL Stopped (08/24/20 1150)   PRN Meds:.sodium chloride, albuterol, benzonatate, diphenhydrAMINE **OR** diphenhydrAMINE, morphine injection, naloxone **AND** sodium chloride flush, ondansetron (ZOFRAN) IV, oxyCODONE, polyethylene glycol  Consultants:  Pharmacy consult for remdesivir  Procedures:  None  Antibiotics:  None  Assessment/Plan: Principal Problem:   Sickle cell pain crisis (HCC) Active Problems:   Hypoxia   Leukocytosis   Chronic pain syndrome   COVID-19  Sickle cell disease with pain crisis: Continue IV fluids at [facility] Discontinue Dilaudid.  Add morphine 2 mg every 2 hours as needed Oxycodone 15 mg every 4 hours as needed for severe breakthrough pain Monitor vital signs closely and reevaluate pain scale regularly  Leukocytosis: Stable.  Patient is currently afebrile.  Positive for COVID-19 infection.  Continue remdesivir per pharmacy consult.  Repeat CBC in a.m.  COVID-19 pneumonia: Supportive care.  Remdesivir per pharmacy.  2 L supplemental oxygen.  Incentive spirometer.  Maintain oxygen saturation above 90%.   Chronic pain syndrome: Continue home medications  Hypoxia: Maintain oxygen saturation above 90%.  Supplemental oxygen at 2 L/min.  Sickle cell anemia: Hemoglobin is stable and consistent with patient's baseline.  There is no clinical indication for blood transfusion.  Continue to follow closely.  Repeat CBC in a.m.    Code Status: Full Code Family Communication: N/A Disposition Plan: Not yet ready for discharge  Jonny Longino Rennis Petty  APRN, MSN, FNP-C [facility] [address] [place], [place] [address] [phone]   If  5PM-8AM, please contact night-coverage.  08/24/2020, 4:42 PM  LOS: 1 day

## 2020-08-24 NOTE — Plan of Care (Signed)
Plan of care discussed.   

## 2020-08-24 NOTE — ED Notes (Signed)
Pt increased from 2L [place] to 3L [place], O2 saturation between 82-85% on 2L. Attempted to reposition pt without improvement

## 2020-08-25 LAB — CBC
HCT: 25.8 % — ABNORMAL LOW (ref 39.0–52.0)
Hemoglobin: 9.4 g/dL — ABNORMAL LOW (ref 13.0–17.0)
MCH: 29.7 pg (ref 26.0–34.0)
MCHC: 36.4 g/dL — ABNORMAL HIGH (ref 30.0–36.0)
MCV: 81.4 fL (ref 80.0–100.0)
Platelets: 304 10*3/uL (ref 150–400)
RBC: 3.17 MIL/uL — ABNORMAL LOW (ref 4.22–5.81)
RDW: 21.2 % — ABNORMAL HIGH (ref 11.5–15.5)
WBC: 9.2 10*3/uL (ref 4.0–10.5)
nRBC: 4.2 % — ABNORMAL HIGH (ref 0.0–0.2)

## 2020-08-25 LAB — BASIC METABOLIC PANEL
Anion gap: 8 (ref 5–15)
BUN: 10 mg/dL (ref 6–20)
CO2: 28 mmol/L (ref 22–32)
Calcium: 8.4 mg/dL — ABNORMAL LOW (ref 8.9–10.3)
Chloride: 100 mmol/L (ref 98–111)
Creatinine, Ser: 0.62 mg/dL (ref 0.61–1.24)
GFR calc Af Amer: >60 mL/min (ref >60)
GFR calc non Af Amer: >60 mL/min (ref >60)
Glucose, Bld: 106 mg/dL — ABNORMAL HIGH (ref 70–99)
Potassium: 4.4 mmol/L (ref 3.5–5.1)
Sodium: 136 mmol/L (ref 135–145)

## 2020-08-25 LAB — CULTURE, BLOOD (ROUTINE X 2)
Culture: NO GROWTH
Culture: NO GROWTH
Special Requests: ADEQUATE
Special Requests: ADEQUATE

## 2020-08-25 MED ORDER — ONDANSETRON HCL 4 MG/2ML IJ SOLN: 4.0000 mg | Freq: Four times a day (QID) | INTRAMUSCULAR | Status: DC | PRN

## 2020-08-25 MED ORDER — NALOXONE HCL 0.4 MG/ML IJ SOLN: 0.4000 mg | INTRAMUSCULAR | Status: DC | PRN

## 2020-08-25 MED ORDER — MORPHINE SULFATE 2 MG/ML IV SOLN
INTRAVENOUS | Status: DC
  Administered 2020-08-25: 4.5 mL via INTRAVENOUS
  Administered 2020-08-25: 15.5 mL via INTRAVENOUS
  Administered 2020-08-25: 10.5 mg via INTRAVENOUS
  Administered 2020-08-26: 2.5 mL via INTRAVENOUS
  Administered 2020-08-26: 18 mg via INTRAVENOUS
  Administered 2020-08-26: 3 mg via INTRAVENOUS
  Administered 2020-08-27: 1.5 mg via INTRAVENOUS
  Administered 2020-08-27: 7.5 mg via INTRAVENOUS
  Administered 2020-08-27: 1.5 mg via INTRAVENOUS
  Filled 2020-08-25 (×2): qty 25

## 2020-08-25 MED ORDER — DIPHENHYDRAMINE HCL 25 MG PO CAPS: 25.0000 mg | ORAL_CAPSULE | ORAL | Status: DC | PRN

## 2020-08-25 MED ORDER — SODIUM CHLORIDE 0.9% FLUSH: 9.0000 mL | INTRAVENOUS | Status: DC | PRN

## 2020-08-25 NOTE — Plan of Care (Signed)

## 2020-08-25 NOTE — Progress Notes (Signed)
Subjective: [name] Hill is a 19 year old male with a medical history significant for sickle cell disease, chronic pain syndrome, opiate dependence and tolerance, hypertension, and anemia of chronic disease was admitted for COVID-19 pneumonia in the setting of sickle cell pain crisis.  Patient states that pain has improved some overnight.  He says pain typically responds well to morphine as opposed to Dilaudid.  Pain is mostly to upper and lower extremities.  He rates pain as 7/10.  Oxygen saturation is 98% on 2 L supplemental oxygen.  He denies any shortness of breath, persistent cough, fever, chills, headache, fatigue, urinary symptoms, nausea, vomiting, or diarrhea.  Objective:  Vital signs in last 24 hours:  Vitals:   08/25/20 0430 08/25/20 0538 08/25/20 0801 08/25/20 0927  BP:  112/62  137/74  Pulse:  72  81  Resp: 16 18 14 18   Temp:  98.2 F (36.8 C)  98.3 F (36.8 C)  TempSrc:      SpO2: 93% 100% 94% 100%  Weight:      Height:        Intake/Output from previous day:   Intake/Output Summary (Last 24 hours) at 08/25/2020 1017 Last data filed at 08/25/2020 0943 Gross per 24 hour  Intake 723 ml  Output -  Net 723 ml    Physical Exam: General: Alert, awake, oriented x3, in no acute distress.  HEENT: [place]/AT PEERL, EOMI Neck: Trachea midline,  no masses, no thyromegal,y no JVD, no carotid bruit OROPHARYNX:  Moist, No exudate/ erythema/lesions.  Heart: Regular rate and rhythm, without murmurs, rubs, gallops, PMI non-displaced, no heaves or thrills on palpation.  Lungs: Clear to auscultation, no wheezing or rhonchi noted. No increased vocal fremitus resonant to percussion  Abdomen: Soft, nontender, nondistended, positive bowel sounds, no masses no hepatosplenomegaly noted..  Neuro: No focal neurological deficits noted cranial nerves II through XII grossly intact. DTRs 2+ bilaterally upper and lower extremities. Strength 5 out of 5 in bilateral upper and lower  extremities. Musculoskeletal: No warm swelling or erythema around joints, no spinal tenderness noted. Psychiatric: Patient alert and oriented x3, good insight and cognition, good recent to remote recall. Lymph node survey: No cervical axillary or inguinal lymphadenopathy noted.  Lab Results:  Basic Metabolic Panel:    Component Value Date/Time   NA 136 08/25/2020 0427   NA 142 06/26/2018 1416   K 4.4 08/25/2020 0427   CL 100 08/25/2020 0427   CO2 28 08/25/2020 0427   BUN 10 08/25/2020 0427   BUN 7 06/26/2018 1416   CREATININE 0.62 08/25/2020 0427   GLUCOSE 106 (H) 08/25/2020 0427   CALCIUM 8.4 (L) 08/25/2020 0427   CBC:    Component Value Date/Time   WBC 9.2 08/25/2020 0427   HGB 9.4 (L) 08/25/2020 0427   HGB 9.6 (L) 06/26/2018 1416   HCT 25.8 (L) 08/25/2020 0427   HCT 28.5 (L) 06/26/2018 1416   PLT 304 08/25/2020 0427   PLT 405 06/26/2018 1416   MCV 81.4 08/25/2020 0427   MCV 92 06/26/2018 1416   NEUTROABS 4.3 08/23/2020 1021   NEUTROABS 3.7 06/26/2018 1416   LYMPHSABS 6.7 (H) 08/23/2020 1021   LYMPHSABS 3.3 (H) 06/26/2018 1416   MONOABS 1.6 (H) 08/23/2020 1021   EOSABS 0.1 08/23/2020 1021   EOSABS 0.2 06/26/2018 1416   BASOSABS 0.2 (H) 08/23/2020 1021   BASOSABS 0.1 06/26/2018 1416    Recent Results (from the past 240 hour(s))  Respiratory Panel by RT PCR (Flu A&B, Covid) - Nasopharyngeal Swab  Status: Abnormal   Collection Time: 08/19/20  9:00 PM   Specimen: Nasopharyngeal Swab  Result Value Ref Range Status   SARS Coronavirus 2 by RT PCR POSITIVE (A) NEGATIVE Final    Comment: RESULT CALLED TO, READ BACK BY AND VERIFIED WITH: K.ALBRIDGEE,RN [id number] @2308  BY V.WILKINS (NOTE) SARS-CoV-2 target nucleic acids are DETECTED.  SARS-CoV-2 RNA is generally detectable in upper respiratory specimens  during the acute phase of infection. Positive results are indicative of the presence of the identified virus, but do not rule out bacterial infection or co-infection  with other pathogens not detected by the test. Clinical correlation with patient history and other diagnostic information is necessary to determine patient infection status. The expected result is Negative.  Fact Sheet for Patients:   Fact Sheet for Healthcare Providers: https://www.moore.com/  This test is not yet approved or cleared by the https://www.young.biz/ FDA and  has been authorized for detection and/or diagnosis of SARS-CoV-2 by FDA under an Emergency Use Authorization (EUA).  This EUA will remain in effect (meaning this test c an be used) for the duration of  the COVID-19 declaration under Section 564(b)(1) of the Act, 21 U.S.C. section 360bbb-3(b)(1), unless the authorization is terminated or revoked sooner.      Influenza A by PCR NEGATIVE NEGATIVE Final   Influenza B by PCR NEGATIVE NEGATIVE Final    Comment: (NOTE) The Xpert Xpress SARS-CoV-2/FLU/RSV assay is intended as an aid in  the diagnosis of influenza from Nasopharyngeal swab specimens and  should not be used as a sole basis for treatment. Nasal washings and  aspirates are unacceptable for Xpert Xpress SARS-CoV-2/FLU/RSV  testing.  Fact Sheet for Patients: [place]  Fact Sheet for Healthcare Providers: https://www.moore.com/  This test is not yet approved or cleared by the https://www.young.biz/ FDA and  has been authorized for detection and/or diagnosis of SARS-CoV-2 by  FDA under an Emergency Use Authorization (EUA). This EUA will remain  in effect (meaning this test can be used) for the duration of the  Covid-19 declaration under Section 564(b)(1) of the Act, 21  U.S.C. section 360bbb-3(b)(1), unless the authorization is  terminated or revoked. Performed at [facility], [address], [place], [place]   Blood Culture (routine x 2)     Status: None   Collection Time:  08/19/20 10:20 PM   Specimen: BLOOD  Result Value Ref Range Status   Specimen Description   Final    BLOOD LEFT ANTECUBITAL Performed at [facility], [address] [place], [place]    Special Requests   Final    BOTTLES DRAWN AEROBIC AND ANAEROBIC Blood Culture adequate volume Performed at [facility], [address] [place], [place]    Culture   Final    NO GROWTH 5 DAYS Performed at [facility], [address], [place], [place]    Report Status 08/25/2020 FINAL  Final  Blood Culture (routine x 2)     Status: None   Collection Time: 08/19/20 10:20 PM   Specimen: BLOOD  Result Value Ref Range Status   Specimen Description   Final    BLOOD RIGHT ANTECUBITAL Performed at [facility], [address] [place], [place]    Special Requests   Final    BOTTLES DRAWN AEROBIC AND ANAEROBIC Blood Culture adequate volume Performed at [facility], [address], [place], [place]  Culture   Final    NO GROWTH 5 DAYS Performed at [facility], [address] [place], [place] [address]    Report Status 08/25/2020 FINAL  Final    Studies/Results: DG Chest Portable 1 View  Result Date: 08/23/2020 CLINICAL DATA:  Shortness of breath. EXAM: PORTABLE CHEST 1 VIEW COMPARISON:  August 19, 2020. FINDINGS: Stable cardiomediastinal silhouette. No pneumothorax or pleural effusion is noted. Both lungs are clear. The visualized skeletal structures are unremarkable. IMPRESSION: No active disease. Electronically Signed   By: Lupita Raider M.D.   On: 08/23/2020 10:57    Medications: Scheduled Meds: . enoxaparin (LOVENOX) injection  40 mg Subcutaneous Q24H  . folic acid  1 mg Oral Daily  . HYDROmorphone   Intravenous Q4H  . hydroxyurea  1,500 mg Oral Daily  . ketorolac  15 mg Intravenous Q6H  . losartan  50 mg Oral Daily  . methocarbamol  500 mg  Oral BID  . senna-docusate  1 tablet Oral BID   Continuous Infusions: . sodium chloride 1,000 mL (08/24/20 1117)  . diphenhydrAMINE    . remdesivir 100 mg in NS 100 mL Stopped (08/24/20 1150)   PRN Meds:.sodium chloride, albuterol, benzonatate, diphenhydrAMINE **OR** diphenhydrAMINE, morphine injection, naloxone **AND** sodium chloride flush, ondansetron (ZOFRAN) IV, oxyCODONE, polyethylene glycol  Consultants:  None  Procedures:  None  Antibiotics:  None  Assessment/Plan: Principal Problem:   Sickle cell pain crisis (HCC) Active Problems:   Hypoxia   Leukocytosis   Chronic pain syndrome   COVID-19  Sickle cell disease with pain crisis: Continue IV fluids at [facility].  Add morphine PCA.  Discontinue morphine per clinician assistant doses.  Oxycodone 15 mg every 4 hours as needed for severe breakthrough pain.  Monitor vital signs closely, reevaluate pain scale regularly, and supplemental oxygen.  Leukocytosis: Stable.  Patient afebrile.  Positive for COVID-19 infection.  Remdesivir per pharmacy consult.  Follow CBC.  COVID-19 pneumonia.  Stable.  Continue supportive care.  Oxygen saturation has improved.  Wean supplemental oxygen as tolerated.  Chronic pain syndrome: Continue home medications  Hypoxia: Stable.  Oxygen saturation consistently maintained above 90%.  Wean supplemental oxygen as tolerated. Titrate to room air.   Hypertension: Stable.  Continue home medication.  Sickle cell anemia: Hemoglobin remained stable and consistent with patient's baseline.  There is no clinical indication for blood transfusion at this time.  Continue to follow closely.  Repeat CBC in a.m.  Code Status: Full Code Family Communication: N/A Disposition Plan: Not yet ready for discharge.  Possible discharge on 08/26/2020  Nolon Nations  APRN, MSN, FNP-C Patient Care [facility] [address] [place], [place] [address] [phone]  If 7PM-7AM,  please contact night-coverage.  08/25/2020, 10:17 AM  LOS: 2 days

## 2020-08-26 DIAGNOSIS — D72829 Elevated white blood cell count, unspecified: Secondary | ICD-10-CM

## 2020-08-26 LAB — CBC
HCT: 24.6 % — ABNORMAL LOW (ref 39.0–52.0)
Hemoglobin: 8.8 g/dL — ABNORMAL LOW (ref 13.0–17.0)
MCH: 30.1 pg (ref 26.0–34.0)
MCHC: 35.8 g/dL (ref 30.0–36.0)
MCV: 84.2 fL (ref 80.0–100.0)
Platelets: 339 10*3/uL (ref 150–400)
RBC: 2.92 MIL/uL — ABNORMAL LOW (ref 4.22–5.81)
RDW: 19.7 % — ABNORMAL HIGH (ref 11.5–15.5)
WBC: 10 10*3/uL (ref 4.0–10.5)
nRBC: 2.1 % — ABNORMAL HIGH (ref 0.0–0.2)

## 2020-08-26 MED ORDER — SALINE SPRAY 0.65 % NA SOLN
1.0000 | NASAL | Status: DC | PRN
  Filled 2020-08-26 (×2): qty 44

## 2020-08-26 MED ORDER — LORATADINE 10 MG PO TABS
10.0000 mg | ORAL_TABLET | Freq: Every day | ORAL | Status: DC
  Administered 2020-08-26 – 2020-08-27 (×2): 10 mg via ORAL
  Filled 2020-08-26 (×2): qty 1

## 2020-08-26 NOTE — Progress Notes (Signed)
Patient ID: [name] Hill, male   DOB: [birth year], 19 y.o.   MRN: [id number] Subjective: [name] Hill is a 19 year old male with a medical history significant for sickle cell disease, chronic pain syndrome, opiate dependence and tolerance, hypertension, and anemia of chronic disease was admitted for COVID-19 pneumonia in the setting of sickle cell pain crisis.  Patient claims his pain has returned overnight, now worse in his lower back and all over his chest, he rates his pain at 8/10.  He also has pain in his upper and lower extremities.  He said the pain is aching and constant and throbbing. His nurse reported patient saturating over 98% of oxygen on room air and after ambulating. Patient denies any shortness of breath, cough, fever, chills, headache, nausea, vomiting or diarrhea.  No urinary symptoms.  Objective:  Vital signs in last 24 hours:  Vitals:   08/26/20 0603 08/26/20 0756 08/26/20 1119 08/26/20 1204  BP:  (!) 102/44  (!) 107/54  Pulse:  (!) 59  70  Resp: (!) 21 16 (!) 24 16  Temp:  98.4 F (36.9 C)  98.1 F (36.7 C)  TempSrc:  Oral  Oral  SpO2: 97% 100% 99% 96%  Weight:      Height:        Intake/Output from previous day:   Intake/Output Summary (Last 24 hours) at 08/26/2020 1245 Last data filed at 08/26/2020 0600 Gross per 24 hour  Intake 883.75 ml  Output --  Net 883.75 ml    Physical Exam: General: Alert, awake, oriented x3, in no acute distress.  HEENT: [name]/AT PEERL, EOMI Neck: Trachea midline,  no masses, no thyromegal,y no JVD, no carotid bruit OROPHARYNX:  Moist, No exudate/ erythema/lesions.  Heart: Regular rate and rhythm, without murmurs, rubs, gallops, PMI non-displaced, no heaves or thrills on palpation.  Lungs: Clear to auscultation, no wheezing or rhonchi noted. No increased vocal fremitus resonant to percussion  Abdomen: Soft, nontender, nondistended, positive bowel sounds, no masses no hepatosplenomegaly noted..  Neuro: No focal neurological  deficits noted cranial nerves II through XII grossly intact. DTRs 2+ bilaterally upper and lower extremities. Strength 5 out of 5 in bilateral upper and lower extremities. Musculoskeletal: No warm swelling or erythema around joints, no spinal tenderness noted. Psychiatric: Patient alert and oriented x3, good insight and cognition, good recent to remote recall. Lymph node survey: No cervical axillary or inguinal lymphadenopathy noted.  Lab Results:  Basic Metabolic Panel:    Component Value Date/Time   NA 136 08/25/2020 0427   NA 142 06/26/2018 1416   K 4.4 08/25/2020 0427   CL 100 08/25/2020 0427   CO2 28 08/25/2020 0427   BUN 10 08/25/2020 0427   BUN 7 06/26/2018 1416   CREATININE 0.62 08/25/2020 0427   GLUCOSE 106 (H) 08/25/2020 0427   CALCIUM 8.4 (L) 08/25/2020 0427   CBC:    Component Value Date/Time   WBC 10.0 08/26/2020 0557   HGB 8.8 (L) 08/26/2020 0557   HGB 9.6 (L) 06/26/2018 1416   HCT 24.6 (L) 08/26/2020 0557   HCT 28.5 (L) 06/26/2018 1416   PLT 339 08/26/2020 0557   PLT 405 06/26/2018 1416   MCV 84.2 08/26/2020 0557   MCV 92 06/26/2018 1416   NEUTROABS 4.3 08/23/2020 1021   NEUTROABS 3.7 06/26/2018 1416   LYMPHSABS 6.7 (H) 08/23/2020 1021   LYMPHSABS 3.3 (H) 06/26/2018 1416   MONOABS 1.6 (H) 08/23/2020 1021   EOSABS 0.1 08/23/2020 1021   EOSABS 0.2 06/26/2018 1416  BASOSABS 0.2 (H) 08/23/2020 1021   BASOSABS 0.1 06/26/2018 1416    Recent Results (from the past 240 hour(s))  Respiratory Panel by RT PCR (Flu A&B, Covid) - Nasopharyngeal Swab     Status: Abnormal   Collection Time: 08/19/20  9:00 PM   Specimen: Nasopharyngeal Swab  Result Value Ref Range Status   SARS Coronavirus 2 by RT PCR POSITIVE (A) NEGATIVE Final    Comment: RESULT CALLED TO, READ BACK BY AND VERIFIED WITH: K.ALBRIDGEE,RN [id number] @2308  BY V.WILKINS (NOTE) SARS-CoV-2 target nucleic acids are DETECTED.  SARS-CoV-2 RNA is generally detectable in upper respiratory specimens  during  the acute phase of infection. Positive results are indicative of the presence of the identified virus, but do not rule out bacterial infection or co-infection with other pathogens not detected by the test. Clinical correlation with patient history and other diagnostic information is necessary to determine patient infection status. The expected result is Negative.  Fact Sheet for Patients:   Fact Sheet for Healthcare Providers: https://www.moore.com/  This test is not yet approved or cleared by the https://www.young.biz/ FDA and  has been authorized for detection and/or diagnosis of SARS-CoV-2 by FDA under an Emergency Use Authorization (EUA).  This EUA will remain in effect (meaning this test c an be used) for the duration of  the COVID-19 declaration under Section 564(b)(1) of the Act, 21 U.S.C. section 360bbb-3(b)(1), unless the authorization is terminated or revoked sooner.      Influenza A by PCR NEGATIVE NEGATIVE Final   Influenza B by PCR NEGATIVE NEGATIVE Final    Comment: (NOTE) The Xpert Xpress SARS-CoV-2/FLU/RSV assay is intended as an aid in  the diagnosis of influenza from Nasopharyngeal swab specimens and  should not be used as a sole basis for treatment. Nasal washings and  aspirates are unacceptable for Xpert Xpress SARS-CoV-2/FLU/RSV  testing.  Fact Sheet for Patients: [place]  Fact Sheet for Healthcare Providers: https://www.moore.com/  This test is not yet approved or cleared by the https://www.young.biz/ FDA and  has been authorized for detection and/or diagnosis of SARS-CoV-2 by  FDA under an Emergency Use Authorization (EUA). This EUA will remain  in effect (meaning this test can be used) for the duration of the  Covid-19 declaration under Section 564(b)(1) of the Act, 21  U.S.C. section 360bbb-3(b)(1), unless the authorization is  terminated or  revoked. Performed at [facility], [address] [place], [place]   Blood Culture (routine x 2)     Status: None   Collection Time: 08/19/20 10:20 PM   Specimen: BLOOD  Result Value Ref Range Status   Specimen Description   Final    BLOOD LEFT ANTECUBITAL Performed at [facility], [address], [place], [place]    Special Requests   Final    BOTTLES DRAWN AEROBIC AND ANAEROBIC Blood Culture adequate volume Performed at [facility], [address], [place], [place]    Culture   Final    NO GROWTH 5 DAYS Performed at [facility], [address] [place], [place]    Report Status 08/25/2020 FINAL  Final  Blood Culture (routine x 2)     Status: None   Collection Time: 08/19/20 10:20 PM   Specimen: BLOOD  Result Value Ref Range Status   Specimen Description   Final    BLOOD RIGHT ANTECUBITAL Performed at [facility], [address] [place], [place]  [address]    Special Requests   Final    BOTTLES DRAWN AEROBIC AND ANAEROBIC Blood Culture adequate volume Performed at [facility], [address], [place], [place] [address]    Culture   Final    NO GROWTH 5 DAYS Performed at [facility], [address], [place], [place] [address]    Report Status 08/25/2020 FINAL  Final    Studies/Results: No results found.  Medications: Scheduled Meds: . enoxaparin (LOVENOX) injection  40 mg Subcutaneous Q24H  . folic acid  1 mg Oral Daily  . hydroxyurea  1,500 mg Oral Daily  . ketorolac  15 mg Intravenous Q6H  . losartan  50 mg Oral Daily  . methocarbamol  500 mg Oral BID  . morphine   Intravenous Q4H  . senna-docusate  1 tablet Oral BID   Continuous Infusions: . sodium chloride 10 mL/hr at 08/25/20 2200   PRN Meds:.sodium chloride, albuterol, benzonatate, diphenhydrAMINE, naloxone **AND** sodium chloride flush, ondansetron  (ZOFRAN) IV, oxyCODONE, polyethylene glycol  Consultants:  None  Procedures:  None  Antibiotics:  None  Assessment/Plan: Principal Problem:   Sickle cell pain crisis (HCC) Active Problems:   Hypoxia   Leukocytosis   Chronic pain syndrome   COVID-19  1. Hb Sickle Cell Disease with crisis: Continue IVF @ KVO, continue weight based morphine PCA, continue IV Toradol 15 mg Q 6 H, Monitor vitals very closely, Re-evaluate pain scale regularly, 2 L of Oxygen by [place]. 2. Leukocytosis: Stable patient is afebrile.  Continue Remdesivir per pharmacy consult.  Continue to observe without antibiotics. 3. Sickle Cell Anemia: Hemoglobin is stable at baseline, no clinical indication for blood transfusion today.  We will continue to monitor closely. 4. Chronic pain Syndrome: Continue home pain medications. 5. COVID-19 pneumonia: Stable.  Continue supportive care.  Continue to wean supplemental oxygen.  Per nurse, patient is saturating well even on room air. 6. Essential hypertension: Controlled. Continue home medications.  Code Status: Full Code Family Communication: N/A Disposition Plan: Not yet ready for discharge  Hermine Feria  If 7PM-7AM, please contact night-coverage.  08/26/2020, 12:45 PM  LOS: 3 days

## 2020-08-27 LAB — COMPREHENSIVE METABOLIC PANEL
ALT: 17 U/L (ref 0–44)
AST: 30 U/L (ref 15–41)
Albumin: 3.6 g/dL (ref 3.5–5.0)
Alkaline Phosphatase: 51 U/L (ref 38–126)
Anion gap: 9 (ref 5–15)
BUN: 11 mg/dL (ref 6–20)
CO2: 26 mmol/L (ref 22–32)
Calcium: 8.6 mg/dL — ABNORMAL LOW (ref 8.9–10.3)
Chloride: 101 mmol/L (ref 98–111)
Creatinine, Ser: 0.56 mg/dL — ABNORMAL LOW (ref 0.61–1.24)
GFR calc Af Amer: >60 mL/min (ref >60)
GFR calc non Af Amer: >60 mL/min (ref >60)
Glucose, Bld: 96 mg/dL (ref 70–99)
Potassium: 4.3 mmol/L (ref 3.5–5.1)
Sodium: 136 mmol/L (ref 135–145)
Total Bilirubin: 3.4 mg/dL — ABNORMAL HIGH (ref 0.3–1.2)
Total Protein: 6.6 g/dL (ref 6.5–8.1)

## 2020-08-27 MED ORDER — PREDNISONE 50 MG PO TABS: 50.0000 mg | ORAL_TABLET | Freq: Every day | ORAL | 0 refills | Status: AC

## 2020-08-27 MED ORDER — OXYCODONE HCL 15 MG PO TABS
15.0000 mg | ORAL_TABLET | ORAL | 0 refills | Status: DC | PRN
Start: 2020-08-27 — End: 2020-09-04

## 2020-08-27 MED ORDER — METHYLPREDNISOLONE SODIUM SUCC 125 MG IJ SOLR
50.0000 mg | Freq: Two times a day (BID) | INTRAMUSCULAR | Status: DC
  Administered 2020-08-27: 50 mg via INTRAVENOUS
  Filled 2020-08-27: qty 2

## 2020-08-27 MED ORDER — PREDNISONE 20 MG PO TABS: 50.0000 mg | ORAL_TABLET | Freq: Every day | ORAL | Status: DC

## 2020-08-27 NOTE — Discharge Instructions (Signed)
COVID-19 COVID-19 is a respiratory infection that is caused by a virus called severe acute respiratory syndrome coronavirus 2 (SARS-CoV-2). The disease is also known as coronavirus disease or novel coronavirus. In some people, the virus may not cause any symptoms. In others, it may cause a serious infection. The infection can get worse quickly and can lead to complications, such as:  Pneumonia, or infection of the lungs.  Acute respiratory distress syndrome or ARDS. This is a condition in which fluid build-up in the lungs prevents the lungs from filling with air and passing oxygen into the blood.  Acute respiratory failure. This is a condition in which there is not enough oxygen passing from the lungs to the body or when carbon dioxide is not passing from the lungs out of the body.  Sepsis or septic shock. This is a serious bodily reaction to an infection.  Blood clotting problems.  Secondary infections due to bacteria or fungus.  Organ failure. This is when your body's organs stop working. The virus that causes COVID-19 is contagious. This means that it can spread from person to person through droplets from coughs and sneezes (respiratory secretions). What are the causes? This illness is caused by a virus. You may catch the virus by:  Breathing in droplets from an infected person. Droplets can be spread by a person breathing, speaking, singing, coughing, or sneezing.  Touching something, like a table or a doorknob, that was exposed to the virus (contaminated) and then touching your mouth, nose, or eyes. What increases the risk? Risk for infection You are more likely to be infected with this virus if you:  Are within 6 feet (2 meters) of a person with COVID-19.  Provide care for or live with a person who is infected with COVID-19.  Spend time in crowded indoor spaces or live in shared housing. Risk for serious illness You are more likely to become seriously ill from the virus if  you:  Are 50 years of age or older. The higher your age, the more you are at risk for serious illness.  Live in a nursing home or long-term care facility.  Have cancer.  Have a long-term (chronic) disease such as: ? Chronic lung disease, including chronic obstructive pulmonary disease or asthma. ? A long-term disease that lowers your body's ability to fight infection (immunocompromised). ? Heart disease, including heart failure, a condition in which the arteries that lead to the heart become narrow or blocked (coronary artery disease), a disease which makes the heart muscle thick, weak, or stiff (cardiomyopathy). ? Diabetes. ? Chronic kidney disease. ? Sickle cell disease, a condition in which red blood cells have an abnormal "sickle" shape. ? Liver disease.  Are obese. What are the signs or symptoms? Symptoms of this condition can range from mild to severe. Symptoms may appear any time from 2 to 14 days after being exposed to the virus. They include:  A fever or chills.  A cough.  Difficulty breathing.  Headaches, body aches, or muscle aches.  Runny or stuffy (congested) nose.  A sore throat.  New loss of taste or smell. Some people may also have stomach problems, such as nausea, vomiting, or diarrhea. Other people may not have any symptoms of COVID-19. How is this diagnosed? This condition may be diagnosed based on:  Your signs and symptoms, especially if: ? You live in an area with a COVID-19 outbreak. ? You recently traveled to or from an area where the virus is common. ? You   provide care for or live with a person who was diagnosed with COVID-19. ? You were exposed to a person who was diagnosed with COVID-19.  A physical exam.  Lab tests, which may include: ? Taking a sample of fluid from the back of your nose and throat (nasopharyngeal fluid), your nose, or your throat using a swab. ? A sample of mucus from your lungs (sputum). ? Blood tests.  Imaging tests,  which may include, X-rays, CT scan, or ultrasound. How is this treated? At present, there is no medicine to treat COVID-19. Medicines that treat other diseases are being used on a trial basis to see if they are effective against COVID-19. Your health care provider will talk with you about ways to treat your symptoms. For most people, the infection is mild and can be managed at home with rest, fluids, and over-the-counter medicines. Treatment for a serious infection usually takes places in a hospital intensive care unit (ICU). It may include one or more of the following treatments. These treatments are given until your symptoms improve.  Receiving fluids and medicines through an IV.  Supplemental oxygen. Extra oxygen is given through a tube in the nose, a face mask, or a hood.  Positioning you to lie on your stomach (prone position). This makes it easier for oxygen to get into the lungs.  Continuous positive airway pressure (CPAP) or bi-level positive airway pressure (BPAP) machine. This treatment uses mild air pressure to keep the airways open. A tube that is connected to a motor delivers oxygen to the body.  Ventilator. This treatment moves air into and out of the lungs by using a tube that is placed in your windpipe.  Tracheostomy. This is a procedure to create a hole in the neck so that a breathing tube can be inserted.  Extracorporeal membrane oxygenation (ECMO). This procedure gives the lungs a chance to recover by taking over the functions of the heart and lungs. It supplies oxygen to the body and removes carbon dioxide. Follow these instructions at home: Lifestyle  If you are sick, stay home except to get medical care. Your health care provider will tell you how long to stay home. Call your health care provider before you go for medical care.  Rest at home as told by your health care provider.  Do not use any products that contain nicotine or tobacco, such as cigarettes,  e-cigarettes, and chewing tobacco. If you need help quitting, ask your health care provider.  Return to your normal activities as told by your health care provider. Ask your health care provider what activities are safe for you. General instructions  Take over-the-counter and prescription medicines only as told by your health care provider.  Drink enough fluid to keep your urine pale yellow.  Keep all follow-up visits as told by your health care provider. This is important. How is this prevented?  There is no vaccine to help prevent COVID-19 infection. However, there are steps you can take to protect yourself and others from this virus. To protect yourself:   Do not travel to areas where COVID-19 is a risk. The areas where COVID-19 is reported change often. To identify high-risk areas and travel restrictions, check the CDC travel website: wwwnc.cdc.gov/travel/notices  If you live in, or must travel to, an area where COVID-19 is a risk, take precautions to avoid infection. ? Stay away from people who are sick. ? Wash your hands often with soap and water for 20 seconds. If soap and water   are not available, use an alcohol-based hand sanitizer. ? Avoid touching your mouth, face, eyes, or nose. ? Avoid going out in public, follow guidance from your state and local health authorities. ? If you must go out in public, wear a cloth face covering or face mask. Make sure your mask covers your nose and mouth. ? Avoid crowded indoor spaces. Stay at least 6 feet (2 meters) away from others. ? Disinfect objects and surfaces that are frequently touched every day. This may include:  Counters and tables.  Doorknobs and light switches.  Sinks and faucets.  Electronics, such as phones, remote controls, keyboards, computers, and tablets. To protect others: If you have symptoms of COVID-19, take steps to prevent the virus from spreading to others.  If you think you have a COVID-19 infection, contact  your health care provider right away. Tell your health care team that you think you may have a COVID-19 infection.  Stay home. Leave your house only to seek medical care. Do not use public transport.  Do not travel while you are sick.  Wash your hands often with soap and water for 20 seconds. If soap and water are not available, use alcohol-based hand sanitizer.  Stay away from other members of your household. Let healthy household members care for children and pets, if possible. If you have to care for children or pets, wash your hands often and wear a mask. If possible, stay in your own room, separate from others. Use a different bathroom.  Make sure that all people in your household wash their hands well and often.  Cough or sneeze into a tissue or your sleeve or elbow. Do not cough or sneeze into your hand or into the air.  Wear a cloth face covering or face mask. Make sure your mask covers your nose and mouth. Where to find more information  Centers for Disease Control and Prevention: www.cdc.gov/coronavirus/2019-ncov/index.html  World Health Organization: www.who.int/health-topics/coronavirus Contact a health care provider if:  You live in or have traveled to an area where COVID-19 is a risk and you have symptoms of the infection.  You have had contact with someone who has COVID-19 and you have symptoms of the infection. Get help right away if:  You have trouble breathing.  You have pain or pressure in your chest.  You have confusion.  You have bluish lips and fingernails.  You have difficulty waking from sleep.  You have symptoms that get worse. These symptoms may represent a serious problem that is an emergency. Do not wait to see if the symptoms will go away. Get medical help right away. Call your local emergency services (911 in the [place] Do not drive yourself to the hospital. Let the emergency medical personnel know if you think you have  COVID-19. Summary  COVID-19 is a respiratory infection that is caused by a virus. It is also known as coronavirus disease or novel coronavirus. It can cause serious infections, such as pneumonia, acute respiratory distress syndrome, acute respiratory failure, or sepsis.  The virus that causes COVID-19 is contagious. This means that it can spread from person to person through droplets from breathing, speaking, singing, coughing, or sneezing.  You are more likely to develop a serious illness if you are 50 years of age or older, have a weak immune system, live in a nursing home, or have chronic disease.  There is no medicine to treat COVID-19. Your health care provider will talk with you about ways to treat your symptoms.    Take steps to protect yourself and others from infection. Wash your hands often and disinfect objects and surfaces that are frequently touched every day. Stay away from people who are sick and wear a mask if you are sick. This information is not intended to replace advice given to you by your health care provider. Make sure you discuss any questions you have with your health care provider. Document Revised: 09/10/2019 Document Reviewed: 12/17/2018 Elsevier Patient Education  [place] Can Do to Manage Your COVID-19 Symptoms at Home If you have possible or confirmed COVID-19: 1. Stay home from work and school. And stay away from other public places. If you must go out, avoid using any kind of public transportation, ridesharing, or taxis. 2. Monitor your symptoms carefully. If your symptoms get worse, call your healthcare provider immediately. 3. Get rest and stay hydrated. 4. If you have a medical appointment, call the healthcare provider ahead of time and tell them that you have or may have COVID-19. 5. For medical emergencies, call 911 and notify the dispatch personnel that you have or may have COVID-19. 6. Cover your cough and sneezes with a tissue or  use the inside of your elbow. 7. Wash your hands often with soap and water for at least 20 seconds or clean your hands with an alcohol-based hand sanitizer that contains at least 60% alcohol. 8. As much as possible, stay in a specific room and away from other people in your home. Also, you should use a separate bathroom, if available. If you need to be around other people in or outside of the home, wear a mask. 9. Avoid sharing personal items with other people in your household, like dishes, towels, and bedding. 10. Clean all surfaces that are touched often, like counters, tabletops, and doorknobs. Use household cleaning sprays or wipes according to the label instructions. [email or web address] 05/26/2019 This information is not intended to replace advice given to you by your health care provider. Make sure you discuss any questions you have with your health care provider. Document Revised: 10/28/2019 Document Reviewed: 10/28/2019 Elsevier Patient Education  [place].   COVID-19 Frequently Asked Questions COVID-19 (coronavirus disease) is an infection that is caused by a large family of viruses. Some viruses cause illness in people and others cause illness in animals like camels, cats, and bats. In some cases, the viruses that cause illness in animals can spread to humans. Where did the coronavirus come from? In December 2019, [place] told the [facility]) of several cases of lung disease (human respiratory illness). These cases were linked to an open seafood and livestock market in the city of [place]. The link to the seafood and livestock market suggests that the virus may have spread from animals to humans. However, since that first outbreak in December, the virus has also been shown to spread from person to person. What is the name of the disease and the virus? Disease name Early on, this disease was called novel coronavirus. This is because scientists determined that the  disease was caused by a new (novel) respiratory virus. The World Health Organization [facility]) has now named the disease COVID-19, or coronavirus disease. Virus name The virus that causes the disease is called severe acute respiratory syndrome coronavirus 2 (SARS-CoV-2). More information on disease and virus naming World Health Organization [facility]): www.who.int/emergencies/diseases/novel-coronavirus-2019/technical-guidance/naming-the-coronavirus-disease-(covid-2019)-and-the-virus-that-causes-it Who is at risk for complications from coronavirus disease? Some people may be at higher risk for complications from coronavirus disease. This  includes older adults and people who have chronic diseases, such as heart disease, diabetes, and lung disease. If you are at higher risk for complications, take these extra precautions:  Stay home as much as possible.  Avoid social gatherings and travel.  Avoid close contact with others. Stay at least 6 ft (2 m) away from others, if possible.  Wash your hands often with soap and water for at least 20 seconds.  Avoid touching your face, mouth, nose, or eyes.  Keep supplies on hand at home, such as food, medicine, and cleaning supplies.  If you must go out in public, wear a cloth face covering or face mask. Make sure your mask covers your nose and mouth. How does coronavirus disease spread? The virus that causes coronavirus disease spreads easily from person to person (is contagious). You may catch the virus by:  Breathing in droplets from an infected person. Droplets can be spread by a person breathing, speaking, singing, coughing, or sneezing.  Touching something, like a table or a doorknob, that was exposed to the virus (contaminated) and then touching your mouth, nose, or eyes. Can I get the virus from touching surfaces or objects? There is still a lot that we do not know about the virus that causes coronavirus disease. Scientists are basing a lot of information  on what they know about similar viruses, such as:  Viruses cannot generally survive on surfaces for long. They need a human body (host) to survive.  It is more likely that the virus is spread by close contact with people who are sick (direct contact), such as through: ? Shaking hands or hugging. ? Breathing in respiratory droplets that travel through the air. Droplets can be spread by a person breathing, speaking, singing, coughing, or sneezing.  It is less likely that the virus is spread when a person touches a surface or object that has the virus on it (indirect contact). The virus may be able to enter the body if the person touches a surface or object and then touches his or her face, eyes, nose, or mouth. Can a person spread the virus without having symptoms of the disease? It may be possible for the virus to spread before a person has symptoms of the disease, but this is most likely not the main way the virus is spreading. It is more likely for the virus to spread by being in close contact with people who are sick and breathing in the respiratory droplets spread by a person breathing, speaking, singing, coughing, or sneezing. What are the symptoms of coronavirus disease? Symptoms vary from person to person and can range from mild to severe. Symptoms may include:  Fever or chills.  Cough.  Difficulty breathing or feeling short of breath.  Headaches, body aches, or muscle aches.  Runny or stuffy (congested) nose.  Sore throat.  New loss of taste or smell.  Nausea, vomiting, or diarrhea. These symptoms can appear anywhere from 2 to 14 days after you have been exposed to the virus. Some people may not have any symptoms. If you develop symptoms, call your health care provider. People with severe symptoms may need hospital care. Should I be tested for this virus? Your health care provider will decide whether to test you based on your symptoms, history of exposure, and your risk  factors. How does a health care provider test for this virus? Health care providers will collect samples to send for testing. Samples may include:  Taking a swab of fluid  from the back of your nose and throat, your nose, or your throat.  Taking fluid from the lungs by having you cough up mucus (sputum) into a sterile cup.  Taking a blood sample. Is there a treatment or vaccine for this virus? Currently, there is no vaccine to prevent coronavirus disease. Also, there are no medicines like antibiotics or antivirals to treat the virus. A person who becomes sick is given supportive care, which means rest and fluids. A person may also relieve his or her symptoms by using over-the-counter medicines that treat sneezing, coughing, and runny nose. These are the same medicines that a person takes for the common cold. If you develop symptoms, call your health care provider. People with severe symptoms may need hospital care. What can I do to protect myself and my family from this virus?     You can protect yourself and your family by taking the same actions that you would take to prevent the spread of other viruses. Take the following actions:  Wash your hands often with soap and water for at least 20 seconds. If soap and water are not available, use alcohol-based hand sanitizer.  Avoid touching your face, mouth, nose, or eyes.  Cough or sneeze into a tissue, sleeve, or elbow. Do not cough or sneeze into your hand or the air. ? If you cough or sneeze into a tissue, throw it away immediately and wash your hands.  Disinfect objects and surfaces that you frequently touch every day.  Stay away from people who are sick.  Avoid going out in public, follow guidance from your state and local health authorities.  Avoid crowded indoor spaces. Stay at least 6 ft (2 m) away from others.  If you must go out in public, wear a cloth face covering or face mask. Make sure your mask covers your nose and  mouth.  Stay home if you are sick, except to get medical care. Call your health care provider before you get medical care. Your health care provider will tell you how long to stay home.  Make sure your vaccines are up to date. Ask your health care provider what vaccines you need. What should I do if I need to travel? Follow travel recommendations from your local health authority, the CDC, and WHO. Travel information and advice  Centers for Disease Control and Prevention (CDC): BodyEditor.hu  World Health Organization [facility]): ThirdIncome.ca Know the risks and take action to protect your health  You are at higher risk of getting coronavirus disease if you are traveling to areas with an outbreak or if you are exposed to travelers from areas with an outbreak.  Wash your hands often and practice good hygiene to lower the risk of catching or spreading the virus. What should I do if I am sick? General instructions to stop the spread of infection  Wash your hands often with soap and water for at least 20 seconds. If soap and water are not available, use alcohol-based hand sanitizer.  Cough or sneeze into a tissue, sleeve, or elbow. Do not cough or sneeze into your hand or the air.  If you cough or sneeze into a tissue, throw it away immediately and wash your hands.  Stay home unless you must get medical care. Call your health care provider or local health authority before you get medical care.  Avoid public areas. Do not take public transportation, if possible.  If you can, wear a mask if you must go out of the  house or if you are in close contact with someone who is not sick. Make sure your mask covers your nose and mouth. Keep your home clean  Disinfect objects and surfaces that are frequently touched every day. This may include: ? Counters and tables. ? Doorknobs and light switches. ? Sinks  and faucets. ? Electronics such as phones, remote controls, keyboards, computers, and tablets.  Wash dishes in hot, soapy water or use a dishwasher. Air-dry your dishes.  Wash laundry in hot water. Prevent infecting other household members  Let healthy household members care for children and pets, if possible. If you have to care for children or pets, wash your hands often and wear a mask.  Sleep in a different bedroom or bed, if possible.  Do not share personal items, such as razors, toothbrushes, deodorant, combs, brushes, towels, and washcloths. Where to find more information Centers for Disease Control and Prevention (CDC)  Information and news updates: CardRetirement.cz World Health Organization [facility])  Information and news updates: AffordableSalon.es  Coronavirus health topic: https://thompson-craig.com/  Questions and answers on COVID-19: kruiseway.com  Global tracker: who.sprinklr.com American Academy of Pediatrics (AAP)  Information for families: www.healthychildren.org/English/health-issues/conditions/chest-lungs/Pages/2019-Novel-Coronavirus.aspx The coronavirus situation is changing rapidly. Check your local health authority website or the CDC and [facility] websites for updates and news. When should I contact a health care provider?  Contact your health care provider if you have symptoms of an infection, such as fever or cough, and you: ? Have been near anyone who is known to have coronavirus disease. ? Have come into contact with a person who is suspected to have coronavirus disease. ? Have traveled to an area where there is an outbreak of COVID-19. When should I get emergency medical care?  Get help right away by calling your local emergency services (911 in the [place] if you have: ? Trouble breathing. ? Pain or pressure in your chest. ? Confusion. ? Blue-tinged lips and  fingernails. ? Difficulty waking from sleep. ? Symptoms that get worse. Let the emergency medical personnel know if you think you have coronavirus disease. Summary  A new respiratory virus is spreading from person to person and causing COVID-19 (coronavirus disease).  The virus that causes COVID-19 appears to spread easily. It spreads from one person to another through droplets from breathing, speaking, singing, coughing, or sneezing.  Older adults and those with chronic diseases are at higher risk of disease. If you are at higher risk for complications, take extra precautions.  There is currently no vaccine to prevent coronavirus disease. There are no medicines, such as antibiotics or antivirals, to treat the virus.  You can protect yourself and your family by washing your hands often, avoiding touching your face, and covering your coughs and sneezes. This information is not intended to replace advice given to you by your health care provider. Make sure you discuss any questions you have with your health care provider. Document Revised: 09/10/2019 Document Reviewed: 03/09/2019 Elsevier Patient Education  2020 [employer].

## 2020-08-27 NOTE — Progress Notes (Signed)
Patient stated he is feeling better. He will be discharging with his belongings later this afternoon. Education on medications will be provided.

## 2020-08-27 NOTE — Discharge Summary (Signed)
Physician Discharge Summary  [name] BameKaileb M Plucinski NWG:[id number] DOB: [birth year] DOA: 08/23/2020  PCP: Kallie LocksStroud, Natalie M, FNP  Admit date: 08/23/2020  Discharge date: 08/27/2020  Discharge Diagnoses:  Principal Problem:   Sickle cell pain crisis (HCC) Active Problems:   Leukocytosis   Chronic pain syndrome   COVID-19  Discharge Condition: Stable  Disposition:   Follow-up Information    Kallie LocksStroud, Natalie M, FNP. Call in 1 week(s).   Specialty: Family Medicine Why: Post-Covid Hosp Follow - up Contact information: [address] [place] [id number] [phone]              Pt is discharged home in good condition and is to follow up with Kallie LocksStroud, Natalie M, FNP this week to have labs evaluated. [name] Hill is instructed to increase activity slowly and balance with rest for the next few days, and use prescribed medication to complete treatment of pain  Diet: Regular Wt Readings from Last 3 Encounters:  08/23/20 99.8 kg  08/19/20 99.8 kg  03/25/20 95.3 kg    History of present illness:  [name] Hill  is a 19 y.o. male with a medical history significant for sickle cell disease, chronic pain syndrome, opiate dependence and tolerance, hypertension, and anemia of chronic disease presented complaining of generalized pain that is consistent with previous sickle cell pain crises.  Patient states that pain intensity increased suddenly about 24 hours ago and has been unrelieved by his home medications.  Patient was admitted on 08/19/2020 for COVID-19 pneumonia.  Patient received several doses of remdesivir, but left AGAINST MEDICAL ADVICE prior to completing treatment.  He states that he felt better prior to leaving the hospital and was not in pain at that time.  He has not identified any aggravating or alleviating factors concerning current crisis.  He says that pain is primarily to upper and lower extremities, also endorses pain to mid chest wall.  Pain intensity is 7/10.  Patient has had  no fever, chills, headache, dizziness, or paresthesias.  No shortness of breath, lower extremity swelling, nausea, vomiting, or diarrhea.  ER course: Vital signs show BP 136/76   Pulse 73   Temp 98 F (36.7 C) (Oral)   Resp (!) 24   Ht 5\' 7"  (1.702 m)   Wt 99.8 kg   SpO2 (!) 89%   BMI 34.46 kg/m .  WBCs 12.8, hemoglobin 10.0, and platelets 407.  AST 65 and total bilirubin 3.2.  All of the laboratory values are largely within normal limits.  Chest x-ray shows no acute cardiopulmonary process.  Patient underwent CT angio of chest which showed no definite evidence of pulmonary embolism, mild cardiomegaly, and diffuse sclerosis that is consistent with history of sickle cell disease.  Patient's pain persists despite several doses of IV Dilaudid and IV fluids. Will admit to MedSurg for further management of pain crisis.  Hospital Course:  Patient was admitted for sickle cell pain crisis and managed appropriately with IVF, IV Dilaudid via PCA and IV Toradol, as well as other adjunct therapies per sickle cell pain management protocols.  Patient was later found to test positive for COVID-19 for which she received IV remdesivir 100 mg q. 24-hour x 3 doses and IV steroid for treatment.  He was largely asymptomatic except for mild cough.  Chest x-ray showed no active disease and CT angiogram of chest showed no definitive pulmonary embolism and no consolidation.  Patient's pain slowly improved, cough also improved, he was saturating well even on room air with  no significant hypoxemia on ambulation.  Patient remained hemodynamically stable throughout the admission. As at today patient claims his pain is back to baseline of below 5/10, he is without oxygen and saturating above 95% on room air, he is ambulating well with no significant pain and tolerating p.o. intake without restrictions.  No nausea vomiting or diarrhea.  His comprehensive metabolic panel today are all within normal limits.  Hemoglobin remained  stable at baseline throughout this admission, did not require any blood transfusion.  Patient's general medical condition are stable enough for discharge, and he was was therefore discharged home today in a hemodynamically stable condition.  He was given a new prescription for refill of his home pain medications for 15 days.  Deforest will follow-up with PCP once cleared from quarantine post Covid.  Patient was counseled extensively about self isolation for total of 10 days post diagnosis.  Instructions were given for personal care and return precautions. Damont was counseled extensively about nonpharmacologic means of pain management, patient verbalized understanding and was appreciative of  the care received during this admission.   We discussed the need for good hydration, monitoring of hydration status, avoidance of heat, cold, stress, and infection triggers. We discussed the need to be adherent with taking Hydrea and other home medications. Patient was reminded of the need to seek medical attention immediately if any symptom of bleeding, anemia, or infection occurs.  Discharge Exam: Vitals:   08/27/20 1200 08/27/20 1304  BP:    Pulse:    Resp: 16 16  Temp:    SpO2: 99% 99%   Vitals:   08/27/20 1113 08/27/20 1155 08/27/20 1200 08/27/20 1304  BP:  (!) 107/52    Pulse:  62    Resp: Temp:  97.7 F (36.5 C)    TempSrc:      SpO2: 99% 98% 99% 99%  Weight:      Height:       General appearance : Awake, alert, not in any distress. Speech Clear. Not toxic looking HEENT: Atraumatic and Normocephalic, pupils equally reactive to light and accomodation Neck: Supple, no JVD. No cervical lymphadenopathy.  Chest: Good air entry bilaterally, no added sounds  CVS: S1 S2 regular, no murmurs.  Abdomen: Bowel sounds present, Non tender and not distended with no gaurding, rigidity or rebound. Extremities: B/L Lower Ext shows no edema, both legs are warm to touch Neurology: Awake alert,  and oriented X 3, CN II-XII intact, Non focal Skin: No Rash  Discharge Instructions  Discharge Instructions    Diet - low sodium heart healthy   Complete by: As directed    Increase activity slowly   Complete by: As directed      Allergies as of 08/27/2020      Reactions   Lisinopril Cough      Medication List    TAKE these medications   acetaminophen 500 MG tablet Commonly known as: TYLENOL Take 1,000 mg by mouth daily as needed (severe pain).   albuterol 108 (90 Base) MCG/ACT inhaler Commonly known as: VENTOLIN HFA Inhale 2 puffs into the lungs every 6 (six) hours as needed for wheezing or shortness of breath.   amoxicillin-clavulanate 875-125 MG tablet Commonly known as: Augmentin Take 1 tablet by mouth 2 (two) times daily for 10 days.   benzonatate 100 MG capsule Commonly known as: TESSALON Take 1 capsule (100 mg total) by mouth 2 (two) times daily as needed for cough.   folic acid 1  MG tablet Commonly known as: FOLVITE Take 1 tablet (1 mg total) by mouth daily.   hydroxyurea 400 MG capsule Commonly known as: DROXIA Take 4 capsules (1,600 mg total) by mouth daily.   ibuprofen 200 MG tablet Commonly known as: ADVIL Take 200 mg by mouth every 6 (six) hours as needed for moderate pain.   losartan 50 MG tablet Commonly known as: COZAAR Take 50 mg by mouth daily.   oxyCODONE 15 MG immediate release tablet Commonly known as: ROXICODONE Take 1 tablet (15 mg total) by mouth every 4 (four) hours as needed for up to 15 days for severe pain or breakthrough pain.   predniSONE 50 MG tablet Commonly known as: DELTASONE Take 1 tablet (50 mg total) by mouth daily with breakfast for 5 days. Start taking on: August 28, 2020       The results of significant diagnostics from this hospitalization (including imaging, microbiology, ancillary and laboratory) are listed below for reference.    Significant Diagnostic Studies: CT ANGIO CHEST PE W OR WO CONTRAST  Result  Date: 08/21/2020 CLINICAL DATA:  COVID-19 positive. High probability of pulmonary embolus. EXAM: CT ANGIOGRAPHY CHEST WITH CONTRAST TECHNIQUE: Multidetector CT imaging of the chest was performed using the standard protocol during bolus administration of intravenous contrast. Multiplanar CT image reconstructions and MIPs were obtained to evaluate the vascular anatomy. CONTRAST:  OMNIPAQUE IOHEXOL 350 MG/ML SOLN COMPARISON:  Apr 01, 2020. FINDINGS: Cardiovascular: Satisfactory opacification of the pulmonary arteries to the segmental level. No evidence of pulmonary embolism. Mild cardiomegaly is noted. No pericardial effusion. Mediastinum/Nodes: No enlarged mediastinal, hilar, or axillary lymph nodes. Thyroid gland, trachea, and esophagus demonstrate no significant findings. Lungs/Pleura: Lungs are clear. No pleural effusion or pneumothorax. Upper Abdomen: No acute abnormality. Musculoskeletal: Diffuse sclerosis is noted consistent with history of sickle cell disease. Review of the MIP images confirms the above findings. IMPRESSION: 1. No definite evidence of pulmonary embolus. 2. Mild cardiomegaly. 3. Diffuse sclerosis is noted consistent with history of sickle cell disease. Electronically Signed   By: Lupita Raider M.D.   On: 08/21/2020 18:04   DG Chest Portable 1 View  Result Date: 08/23/2020 CLINICAL DATA:  Shortness of breath. EXAM: PORTABLE CHEST 1 VIEW COMPARISON:  August 19, 2020. FINDINGS: Stable cardiomediastinal silhouette. No pneumothorax or pleural effusion is noted. Both lungs are clear. The visualized skeletal structures are unremarkable. IMPRESSION: No active disease. Electronically Signed   By: Lupita Raider M.D.   On: 08/23/2020 10:57   DG Chest Port 1 View  Result Date: 08/19/2020 CLINICAL DATA:  COVID like symptoms. EXAM: PORTABLE CHEST 1 VIEW COMPARISON:  January 30, 2020 FINDINGS: The heart is enlarged. The main pulmonary artery is again dilated. The lung volumes are somewhat low.  The lungs are mostly clear with some atelectasis at the lung bases. There is no acute osseous abnormality. There is no pneumothorax. IMPRESSION: 1. Low lung volumes with bibasilar atelectasis. 2. Cardiomegaly. 3. Dilated main pulmonary artery can be seen in patients with elevated pulmonary artery pressures. Electronically Signed   By: Katherine Mantle M.D.   On: 08/19/2020 21:55   VAS [place] LOWER EXTREMITY VENOUS (DVT)  Result Date: 08/21/2020  Lower Venous DVT Study Indications: Edema.  Risk Factors: COVID 19 positive. Comparison Study: No prior studies. Performing Technologist: Chanda Busing RVT  Examination Guidelines: A complete evaluation includes B-mode imaging, spectral Doppler, color Doppler, and power Doppler as needed of all accessible portions of each vessel. Bilateral testing is considered an  integral part of a complete examination. Limited examinations for reoccurring indications may be performed as noted. The reflux portion of the exam is performed with the patient in reverse Trendelenburg.  +---------+---------------+---------+-----------+----------+--------------+ RIGHT    CompressibilityPhasicitySpontaneityPropertiesThrombus Aging +---------+---------------+---------+-----------+----------+--------------+ CFV      Full           Yes      Yes                                 +---------+---------------+---------+-----------+----------+--------------+ SFJ      Full                                                        +---------+---------------+---------+-----------+----------+--------------+ FV Prox  Full                                                        +---------+---------------+---------+-----------+----------+--------------+ FV Mid   Full                                                        +---------+---------------+---------+-----------+----------+--------------+ FV DistalFull                                                         +---------+---------------+---------+-----------+----------+--------------+ PFV      Full                                                        +---------+---------------+---------+-----------+----------+--------------+ POP      Full           Yes      Yes                                 +---------+---------------+---------+-----------+----------+--------------+ PTV      Full                                                        +---------+---------------+---------+-----------+----------+--------------+ PERO     Full                                                        +---------+---------------+---------+-----------+----------+--------------+   +---------+---------------+---------+-----------+----------+--------------+ LEFT     CompressibilityPhasicitySpontaneityPropertiesThrombus Aging +---------+---------------+---------+-----------+----------+--------------+ CFV  Full           Yes      Yes                                 +---------+---------------+---------+-----------+----------+--------------+ SFJ      Full                                                        +---------+---------------+---------+-----------+----------+--------------+ FV Prox  Full                                                        +---------+---------------+---------+-----------+----------+--------------+ FV Mid   Full                                                        +---------+---------------+---------+-----------+----------+--------------+ FV DistalFull                                                        +---------+---------------+---------+-----------+----------+--------------+ PFV      Full                                                        +---------+---------------+---------+-----------+----------+--------------+ POP      Full           Yes      Yes                                  +---------+---------------+---------+-----------+----------+--------------+ PTV      Full                                                        +---------+---------------+---------+-----------+----------+--------------+ PERO     Full                                                        +---------+---------------+---------+-----------+----------+--------------+     Summary: RIGHT: - There is no evidence of deep vein thrombosis in the lower extremity.  - No cystic structure found in the popliteal fossa.  LEFT: - There is no evidence of deep vein thrombosis in the lower extremity.  - No cystic structure found in the popliteal fossa.  *See table(s) above for measurements and  observations. Electronically signed by Fabienne Bruns MD on 08/21/2020 at 2:52:17 PM.    Final     Microbiology: Recent Results (from the past 240 hour(s))  Respiratory Panel by RT PCR (Flu A&B, Covid) - Nasopharyngeal Swab     Status: Abnormal   Collection Time: 08/19/20  9:00 PM   Specimen: Nasopharyngeal Swab  Result Value Ref Range Status   SARS Coronavirus 2 by RT PCR POSITIVE (A) NEGATIVE Final    Comment: RESULT CALLED TO, READ BACK BY AND VERIFIED WITH: K.ALBRIDGEE,RN [id number] @2308  BY V.WILKINS (NOTE) SARS-CoV-2 target nucleic acids are DETECTED.  SARS-CoV-2 RNA is generally detectable in upper respiratory specimens  during the acute phase of infection. Positive results are indicative of the presence of the identified virus, but do not rule out bacterial infection or co-infection with other pathogens not detected by the test. Clinical correlation with patient history and other diagnostic information is necessary to determine patient infection status. The expected result is Negative.  Fact Sheet for Patients:   Fact Sheet for Healthcare Providers: https://www.moore.com/  This test is not yet approved or cleared by the https://www.young.biz/ FDA and   has been authorized for detection and/or diagnosis of SARS-CoV-2 by FDA under an Emergency Use Authorization (EUA).  This EUA will remain in effect (meaning this test c an be used) for the duration of  the COVID-19 declaration under Section 564(b)(1) of the Act, 21 U.S.C. section 360bbb-3(b)(1), unless the authorization is terminated or revoked sooner.      Influenza A by PCR NEGATIVE NEGATIVE Final   Influenza B by PCR NEGATIVE NEGATIVE Final    Comment: (NOTE) The Xpert Xpress SARS-CoV-2/FLU/RSV assay is intended as an aid in  the diagnosis of influenza from Nasopharyngeal swab specimens and  should not be used as a sole basis for treatment. Nasal washings and  aspirates are unacceptable for Xpert Xpress SARS-CoV-2/FLU/RSV  testing.  Fact Sheet for Patients: [place]  Fact Sheet for Healthcare Providers: https://www.moore.com/  This test is not yet approved or cleared by the https://www.young.biz/ FDA and  has been authorized for detection and/or diagnosis of SARS-CoV-2 by  FDA under an Emergency Use Authorization (EUA). This EUA will remain  in effect (meaning this test can be used) for the duration of the  Covid-19 declaration under Section 564(b)(1) of the Act, 21  U.S.C. section 360bbb-3(b)(1), unless the authorization is  terminated or revoked. Performed at [facility], [address], [place], [place]   Blood Culture (routine x 2)     Status: None   Collection Time: 08/19/20 10:20 PM   Specimen: BLOOD  Result Value Ref Range Status   Specimen Description   Final    BLOOD LEFT ANTECUBITAL Performed at [facility], [address] [place], [place]    Special Requests   Final    BOTTLES DRAWN AEROBIC AND ANAEROBIC Blood Culture adequate volume Performed at [facility], [address], [place], [place]    Culture   Final    NO GROWTH  5 DAYS Performed at [facility], [address], [place], [place]    Report Status 08/25/2020 FINAL  Final  Blood Culture (routine x 2)     Status: None   Collection Time: 08/19/20 10:20 PM   Specimen: BLOOD  Result Value Ref Range Status   Specimen Description   Final    BLOOD RIGHT ANTECUBITAL Performed at [facility], [address] [place], [place] [address]    Special Requests   Final    BOTTLES DRAWN AEROBIC AND ANAEROBIC Blood Culture adequate volume Performed at [facility], [address] [place], [place] [address]    Culture   Final    NO GROWTH 5 DAYS Performed at [facility], [address], [place], [place] [address]    Report Status 08/25/2020 FINAL  Final     Labs: Basic Metabolic Panel: Recent Labs  Lab 08/21/20 0511 08/21/20 0511 08/23/20 1021 08/23/20 1021 08/25/20 0427 08/27/20 0537  NA 138  --  135  --  136 136  K 4.5   < > 4.8   < > 4.4 4.3  CL 104  --  101  --  100 101  CO2 24  --  26  --  28 26  GLUCOSE 133*  --  90  --  106* 96  BUN 14  --  8  --  10 11  CREATININE 0.69  --  0.53*  --  0.62 0.56*  CALCIUM 9.8  --  8.5*  --  8.4* 8.6*   < > = values in this interval not displayed.   Liver Function Tests: Recent Labs  Lab 08/21/20 0511 08/23/20 1021 08/27/20 0537  AST 44* 65* 30  ALT 24 26 17   ALKPHOS 62 54 51  BILITOT 2.9* 3.2* 3.4*  PROT 8.0 6.6 6.6  ALBUMIN 4.9 4.1 3.6   No results for input(s): LIPASE, AMYLASE in the last 168 hours. No results for input(s): AMMONIA in the last 168 hours. CBC: Recent Labs  Lab 08/23/20 1021 08/24/20 0316 08/25/20 0427 08/26/20 0557  WBC 12.8* 11.2* 9.2 10.0  NEUTROABS 4.3  --   --   --   HGB 10.0* 9.3* 9.4* 8.8*  HCT 27.6* 25.2* 25.8* 24.6*  MCV 82.1 82.6 81.4 84.2  PLT 407* 366 304 339   Cardiac Enzymes: No results for input(s): CKTOTAL, CKMB, CKMBINDEX, TROPONINI in the last 168 hours. BNP: Invalid input(s):  POCBNP CBG: Recent Labs  Lab 08/20/20 2114 08/21/20 0733 08/21/20 1140 08/21/20 1607 08/21/20 1947  GLUCAP 99 121* 120* 94 91    Time coordinating discharge: 50 minutes  Signed:  Neyland Pettengill  [facility] 08/27/2020, 2:20 PM

## 2020-08-28 ENCOUNTER — Other Ambulatory Visit: Payer: Self-pay | Admitting: Family Medicine

## 2020-09-04 ENCOUNTER — Other Ambulatory Visit: Payer: Self-pay | Admitting: Family Medicine

## 2020-09-04 DIAGNOSIS — F1190 Opioid use, unspecified, uncomplicated: Secondary | ICD-10-CM

## 2020-09-04 DIAGNOSIS — D571 Sickle-cell disease without crisis: Secondary | ICD-10-CM

## 2020-09-04 DIAGNOSIS — G894 Chronic pain syndrome: Secondary | ICD-10-CM

## 2020-09-04 MED ORDER — OXYCODONE HCL 10 MG PO TABS: 10.0000 mg | ORAL_TABLET | ORAL | 0 refills | Status: DC | PRN

## 2020-09-05 ENCOUNTER — Inpatient Hospital Stay: Payer: Medicaid Other | Admitting: Family Medicine

## 2020-09-06 ENCOUNTER — Other Ambulatory Visit: Payer: Self-pay

## 2020-09-06 ENCOUNTER — Encounter: Payer: Self-pay | Admitting: Family Medicine

## 2020-09-06 ENCOUNTER — Ambulatory Visit (INDEPENDENT_AMBULATORY_CARE_PROVIDER_SITE_OTHER): Payer: Medicaid Other | Admitting: Family Medicine

## 2020-09-06 VITALS — BP 114/64 | HR 88 | Temp 99.3°F | Ht 67.0 in | Wt 192.8 lb

## 2020-09-06 DIAGNOSIS — G894 Chronic pain syndrome: Secondary | ICD-10-CM

## 2020-09-06 DIAGNOSIS — R109 Unspecified abdominal pain: Secondary | ICD-10-CM | POA: Diagnosis not present

## 2020-09-06 DIAGNOSIS — D571 Sickle-cell disease without crisis: Secondary | ICD-10-CM

## 2020-09-06 DIAGNOSIS — F1190 Opioid use, unspecified, uncomplicated: Secondary | ICD-10-CM | POA: Diagnosis not present

## 2020-09-06 DIAGNOSIS — Z09 Encounter for follow-up examination after completed treatment for conditions other than malignant neoplasm: Secondary | ICD-10-CM

## 2020-09-06 DIAGNOSIS — R10A1 Flank pain, right side: Secondary | ICD-10-CM

## 2020-09-06 LAB — POCT URINALYSIS DIPSTICK
Bilirubin, UA: NEGATIVE
Blood, UA: NEGATIVE
Glucose, UA: NEGATIVE
Ketones, UA: NEGATIVE
Leukocytes, UA: NEGATIVE
Nitrite, UA: NEGATIVE
Protein, UA: POSITIVE — AB
Spec Grav, UA: 1.025 (ref 1.010–1.025)
Urobilinogen, UA: 2 E.U./dL — AB
pH, UA: 7 (ref 5.0–8.0)
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
## 2020-09-11 ENCOUNTER — Encounter: Payer: Self-pay | Admitting: Family Medicine

## 2020-09-20 ENCOUNTER — Non-Acute Institutional Stay (HOSPITAL_COMMUNITY)
Admission: AD | Admit: 2020-09-20 | Discharge: 2020-09-20 | Disposition: A | Discharge status: Home / Self Care | Payer: Medicaid Other | Source: Ambulatory Visit | Attending: Internal Medicine | Admitting: Internal Medicine

## 2020-09-20 ENCOUNTER — Telehealth (HOSPITAL_COMMUNITY): Payer: Self-pay | Admitting: *Deleted

## 2020-09-20 ENCOUNTER — Encounter (HOSPITAL_COMMUNITY): Payer: Self-pay | Admitting: Family Medicine

## 2020-09-20 DIAGNOSIS — M79604 Pain in right leg: Secondary | ICD-10-CM | POA: Diagnosis not present

## 2020-09-20 DIAGNOSIS — D5700 Hb-SS disease with crisis, unspecified: Secondary | ICD-10-CM | POA: Insufficient documentation

## 2020-09-20 DIAGNOSIS — Z8616 Personal history of COVID-19: Secondary | ICD-10-CM | POA: Diagnosis not present

## 2020-09-20 LAB — CBC WITH DIFFERENTIAL/PLATELET
Abs Immature Granulocytes: 0.03 K/uL (ref 0.00–0.07)
Basophils Absolute: 0.1 K/uL (ref 0.0–0.1)
Basophils Relative: 1 %
Eosinophils Absolute: 0.3 K/uL (ref 0.0–0.5)
Eosinophils Relative: 2 %
HCT: 29.2 % — ABNORMAL LOW (ref 39.0–52.0)
Hemoglobin: 10.2 g/dL — ABNORMAL LOW (ref 13.0–17.0)
Immature Granulocytes: 0 %
Lymphocytes Relative: 35 %
Lymphs Abs: 3.9 K/uL (ref 0.7–4.0)
MCH: 30.5 pg (ref 26.0–34.0)
MCHC: 34.9 g/dL (ref 30.0–36.0)
MCV: 87.4 fL (ref 80.0–100.0)
Monocytes Absolute: 1.6 K/uL — ABNORMAL HIGH (ref 0.1–1.0)
Monocytes Relative: 14 %
Neutro Abs: 5.4 K/uL (ref 1.7–7.7)
Neutrophils Relative %: 48 %
Platelets: 422 K/uL — ABNORMAL HIGH (ref 150–400)
RBC: 3.34 MIL/uL — ABNORMAL LOW (ref 4.22–5.81)
RDW: 18.2 % — ABNORMAL HIGH (ref 11.5–15.5)
WBC: 11.3 K/uL — ABNORMAL HIGH (ref 4.0–10.5)
nRBC: 0.4 % — ABNORMAL HIGH (ref 0.0–0.2)

## 2020-09-20 LAB — COMPREHENSIVE METABOLIC PANEL WITH GFR
ALT: 15 U/L (ref 0–44)
AST: 24 U/L (ref 15–41)
Albumin: 4 g/dL (ref 3.5–5.0)
Alkaline Phosphatase: 68 U/L (ref 38–126)
Anion gap: 11 (ref 5–15)
BUN: 7 mg/dL (ref 6–20)
CO2: 25 mmol/L (ref 22–32)
Calcium: 9.2 mg/dL (ref 8.9–10.3)
Chloride: 101 mmol/L (ref 98–111)
Creatinine, Ser: 0.65 mg/dL (ref 0.61–1.24)
GFR, Estimated: >60 mL/min
Glucose, Bld: 112 mg/dL — ABNORMAL HIGH (ref 70–99)
Potassium: 3.9 mmol/L (ref 3.5–5.1)
Sodium: 137 mmol/L (ref 135–145)
Total Bilirubin: 3.1 mg/dL — ABNORMAL HIGH (ref 0.3–1.2)
Total Protein: 7 g/dL (ref 6.5–8.1)

## 2020-09-20 LAB — RETICULOCYTES
Immature Retic Fract: 23.7 % — ABNORMAL HIGH (ref 2.3–15.9)
RBC.: 3.32 MIL/uL — ABNORMAL LOW (ref 4.22–5.81)
Retic Count, Absolute: 257 K/uL — ABNORMAL HIGH (ref 19.0–186.0)
Retic Ct Pct: 7.8 % — ABNORMAL HIGH (ref 0.4–3.1)

## 2020-09-20 MED ORDER — NALOXONE HCL 0.4 MG/ML IJ SOLN: 0.4000 mg | INTRAMUSCULAR | Status: DC | PRN

## 2020-09-20 MED ORDER — KETOROLAC TROMETHAMINE 30 MG/ML IJ SOLN
15.0000 mg | Freq: Once | INTRAMUSCULAR | Status: AC
  Administered 2020-09-20: 15 mg via INTRAVENOUS
  Filled 2020-09-20: qty 1

## 2020-09-20 MED ORDER — SODIUM CHLORIDE 0.45 % IV SOLN: INTRAVENOUS | Status: DC

## 2020-09-20 MED ORDER — SODIUM CHLORIDE 0.9% FLUSH: 9.0000 mL | INTRAVENOUS | Status: DC | PRN

## 2020-09-20 MED ORDER — ONDANSETRON HCL 4 MG/2ML IJ SOLN: 4.0000 mg | Freq: Four times a day (QID) | INTRAMUSCULAR | Status: DC | PRN

## 2020-09-20 MED ORDER — HYDROMORPHONE 1 MG/ML IV SOLN
INTRAVENOUS | Status: DC
  Administered 2020-09-20: 7.5 mg via INTRAVENOUS
  Filled 2020-09-20: qty 30

## 2020-09-20 MED ORDER — DIPHENHYDRAMINE HCL 25 MG PO CAPS
25.0000 mg | ORAL_CAPSULE | ORAL | Status: DC | PRN
  Administered 2020-09-20: 25 mg via ORAL
  Filled 2020-09-20: qty 1

## 2020-09-20 MED ORDER — ACETAMINOPHEN 500 MG PO TABS
1000.0000 mg | ORAL_TABLET | Freq: Once | ORAL | Status: AC
  Administered 2020-09-20: 1000 mg via ORAL
  Filled 2020-09-20: qty 2

## 2020-09-20 NOTE — Discharge Summary (Signed)
[facility] Discharge Summary   Patient ID: [name] Hill MRN: [id number] DOB/AGE: 06-09-2001 19 y.o.  Admit date: 09/20/2020 Discharge date: 09/20/2020  Primary Care Physician:  Kallie Locks, FNP  Admission Diagnoses:  Active Problems:   Sickle cell pain crisis [facility])  Discharge Medications:  Allergies as of 09/20/2020      Reactions   Lisinopril Cough      Medication List    TAKE these medications   acetaminophen 500 MG tablet Commonly known as: TYLENOL Take 1,000 mg by mouth daily as needed (severe pain).   albuterol 108 (90 Base) MCG/ACT inhaler Commonly known as: VENTOLIN HFA Inhale 2 puffs into the lungs every 6 (six) hours as needed for wheezing or shortness of breath.   benzonatate 100 MG capsule Commonly known as: TESSALON Take 1 capsule (100 mg total) by mouth 2 (two) times daily as needed for cough.   folic acid 1 MG tablet Commonly known as: FOLVITE Take 1 tablet (1 mg total) by mouth daily.   hydroxyurea 400 MG capsule Commonly known as: DROXIA Take 4 capsules (1,600 mg total) by mouth daily.   ibuprofen 200 MG tablet Commonly known as: ADVIL Take 200 mg by mouth every 6 (six) hours as needed for moderate pain.   losartan 50 MG tablet Commonly known as: COZAAR Take 50 mg by mouth daily.   Oxycodone HCl 10 MG Tabs Take 1 tablet (10 mg total) by mouth every 4 (four) hours as needed.        Consults:  None  Significant Diagnostic Studies:  CT ANGIO CHEST PE W OR WO CONTRAST  Result Date: 08/21/2020 CLINICAL DATA:  COVID-19 positive. High probability of pulmonary embolus. EXAM: CT ANGIOGRAPHY CHEST WITH CONTRAST TECHNIQUE: Multidetector CT imaging of the chest was performed using the standard protocol during bolus administration of intravenous contrast. Multiplanar CT image reconstructions and MIPs were obtained to evaluate the vascular anatomy. CONTRAST:  OMNIPAQUE IOHEXOL 350 MG/ML SOLN COMPARISON:  Apr 01, 2020.  FINDINGS: Cardiovascular: Satisfactory opacification of the pulmonary arteries to the segmental level. No evidence of pulmonary embolism. Mild cardiomegaly is noted. No pericardial effusion. Mediastinum/Nodes: No enlarged mediastinal, hilar, or axillary lymph nodes. Thyroid gland, trachea, and esophagus demonstrate no significant findings. Lungs/Pleura: Lungs are clear. No pleural effusion or pneumothorax. Upper Abdomen: No acute abnormality. Musculoskeletal: Diffuse sclerosis is noted consistent with history of sickle cell disease. Review of the MIP images confirms the above findings. IMPRESSION: 1. No definite evidence of pulmonary embolus. 2. Mild cardiomegaly. 3. Diffuse sclerosis is noted consistent with history of sickle cell disease. Electronically Signed   By: Lupita Raider M.D.   On: 08/21/2020 18:04   DG Chest Portable 1 View  Result Date: 08/23/2020 CLINICAL DATA:  Shortness of breath. EXAM: PORTABLE CHEST 1 VIEW COMPARISON:  August 19, 2020. FINDINGS: Stable cardiomediastinal silhouette. No pneumothorax or pleural effusion is noted. Both lungs are clear. The visualized skeletal structures are unremarkable. IMPRESSION: No active disease. Electronically Signed   By: Lupita Raider M.D.   On: 08/23/2020 10:57   History of present illness: [name] Hill is a 19 year old male with a medical history significant for sickle cell disease, chronic pain syndrome, opiate dependence and tolerance, and history of anemia of chronic disease presents complaining of generalized pain that is consistent with his typical pain crisis.  Patient states that pain intensity has been elevated over the past 5 days.  He has been trying to manage at home.  He last had oxycodone this a.m. without sustained relief.  He characterizes his pain as constant and aching.  Pain intensity is 10/10.  He denies any fever, chills, chest pain, shortness of breath, urinary symptoms, nausea, vomiting, or diarrhea.  No sick contacts,  recent travel, or exposure to COVID-19.   [facility] Course:  Patient admitted to sickle cell day infusion center for management of pain crisis. Reviewed all laboratory values, largely consistent with patient's baseline. Pain managed with IV Dilaudid PCA with settings of 0.5 mg, 10-minute lockout, and 3 mg/h IV Toradol 15 mg x 1 Tylenol 1000 mg x 1 IV fluids, 0.45% saline at 100 mL/h. Pain intensity decreased to 3/10, patient does not warrant inpatient admission on today. He is alert, oriented, and ambulating without assistance. Patient will discharge home in a hemodynamically stable condition.  Advised to follow-up with PCP for medication management as scheduled.  Discharge instructions:  Resume all home medications.   Follow up with PCP as previously  scheduled.   Discussed the importance of drinking 64 ounces of water daily, dehydration of red blood cells may lead further sickling.   Avoid all stressors that precipitate sickle cell pain crisis.     The patient was given clear instructions to go to ER or return to medical center if symptoms do not improve, worsen or new problems develop.     Physical Exam at Discharge:  BP (!) 133/59 (BP Location: Left Arm)   Pulse 70   Temp 98.2 F (36.8 C) (Temporal)   Resp 14   SpO2 95%   Physical Exam Constitutional:      Appearance: Normal appearance.  Eyes:     Pupils: Pupils are equal, round, and reactive to light.  Cardiovascular:     Rate and Rhythm: Normal rate and regular rhythm.     Pulses: Normal pulses.  Pulmonary:     Effort: Pulmonary effort is normal.  Abdominal:     General: Bowel sounds are normal.  Skin:    General: Skin is warm.  Neurological:     General: No focal deficit present.     Mental Status: He is alert. Mental status is at baseline.  Psychiatric:        Mood and Affect: Mood normal.        Behavior: Behavior normal.        Thought Content: Thought content normal.         Judgment: Judgment normal.      Disposition at Discharge: Discharge disposition: 01-Home or Self Care       Discharge Orders: Discharge Instructions    Discharge patient   Complete by: As directed    Discharge disposition: 01-Home or Self Care   Discharge patient date: 09/20/2020      Condition at Discharge:   Stable  Time spent on Discharge:  Greater than 30 minutes.  Signed: Nolon Nations  APRN, MSN, FNP-C Patient Care [facility] [address] [place], [place] [address] [phone]  09/20/2020, 4:43 PM

## 2020-09-20 NOTE — Progress Notes (Signed)
Patient admitted to the day hospital for treatment of sickle cell pain crisis. Patient reported right leg pain rated 7/10. Patient placed on Dilaudid PCA, given PO tylenol, IV toradol and hydrated with IV fluids. At discharge patient reported  pain at 3/10. Discharge instructions given to patient. Patient alert, oriented and ambulatory at discharge.

## 2020-09-20 NOTE — Discharge Instructions (Signed)
Sickle Cell Anemia, Adult  Sickle cell anemia is a condition where your red blood cells are shaped like sickles. Red blood cells carry oxygen through the body. Sickle-shaped cells do not live as long as normal red blood cells. They also clump together and block blood from flowing through the blood vessels. This prevents the body from getting enough oxygen. Sickle cell anemia causes organ damage and pain. It also increases the risk of infection. Follow these instructions at home: Medicines  Take over-the-counter and prescription medicines only as told by your doctor.  If you were prescribed an antibiotic medicine, take it as told by your doctor. Do not stop taking the antibiotic even if you start to feel better.  If you develop a fever, do not take medicines to lower the fever right away. Tell your doctor about the fever. Managing pain, stiffness, and swelling  Try these methods to help with pain: ? Use a heating pad. ? Take a warm bath. ? Distract yourself, such as by watching TV. Eating and drinking  Drink enough fluid to keep your pee (urine) clear or pale yellow. Drink more in hot weather and during exercise.  Limit or avoid alcohol.  Eat a healthy diet. Eat plenty of fruits, vegetables, whole grains, and lean protein.  Take vitamins and supplements as told by your doctor. Traveling  When traveling, keep these with you: ? Your medical information. ? The names of your doctors. ? Your medicines.  If you need to take an airplane, talk to your doctor first. Activity  Rest often.  Avoid exercises that make your heart beat much faster, such as jogging. General instructions  Do not use products that have nicotine or tobacco, such as cigarettes and e-cigarettes. If you need help quitting, ask your doctor.  Consider wearing a medical alert bracelet.  Avoid being in high places (high altitudes), such as mountains.  Avoid very hot or cold temperatures.  Avoid places where the  temperature changes a lot.  Keep all follow-up visits as told by your doctor. This is important. Contact a doctor if:  A joint hurts.  Your feet or hands hurt or swell.  You feel tired (fatigued). Get help right away if:  You have symptoms of infection. These include: ? Fever. ? Chills. ? Being very tired. ? Irritability. ? Poor eating. ? Throwing up (vomiting).  You feel dizzy or faint.  You have new stomach pain, especially on the left side.  You have a an erection (priapism) that lasts more than 4 hours.  You have numbness in your arms or legs.  You have a hard time moving your arms or legs.  You have trouble talking.  You have pain that does not go away when you take medicine.  You are short of breath.  You are breathing fast.  You have a long-term cough.  You have pain in your chest.  You have a bad headache.  You have a stiff neck.  Your stomach looks bloated even though you did not eat much.  Your skin is pale.  You suddenly cannot see well. Summary  Sickle cell anemia is a condition where your red blood cells are shaped like sickles.  Follow your doctor's advice on ways to manage pain, food to eat, activities to do, and steps to take for safe travel.  Get medical help right away if you have any signs of infection, such as a fever. This information is not intended to replace advice given to you by   your health care provider. Make sure you discuss any questions you have with your health care provider. Document Revised: 03/05/2019 Document Reviewed: 12/17/2016 Elsevier Patient Education  2020 [employer]  
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
## 2020-09-20 NOTE — Telephone Encounter (Signed)
Patient called requesting to come to the day hospital for sickle cell pain. Patient reports bilateral leg pain rated 7/10. Reports taking Oxycodone at 2:00 am. COVID-19 screening done and patient denies all symptoms and exposures. Denies fever, chest pain, nausea, vomiting, diarrhea, abdominal pain and priapism. Admits to having transportation without driving self. [place], FNP notified. Patient can come to the day hospital for pain management. Patient advised and expresses an understanding.

## 2020-10-01 ENCOUNTER — Other Ambulatory Visit: Payer: Self-pay

## 2020-10-01 ENCOUNTER — Emergency Department (HOSPITAL_COMMUNITY): Payer: Medicaid Other

## 2020-10-01 ENCOUNTER — Inpatient Hospital Stay (HOSPITAL_COMMUNITY)
Admission: EM | Admit: 2020-10-01 | Discharge: 2020-10-11 | DRG: 871 | Disposition: A | Discharge status: Home / Self Care | Payer: Medicaid Other | Attending: Internal Medicine | Admitting: Internal Medicine

## 2020-10-01 ENCOUNTER — Encounter (HOSPITAL_COMMUNITY): Payer: Self-pay | Admitting: *Deleted

## 2020-10-01 DIAGNOSIS — D5700 Hb-SS disease with crisis, unspecified: Secondary | ICD-10-CM | POA: Diagnosis not present

## 2020-10-01 DIAGNOSIS — D638 Anemia in other chronic diseases classified elsewhere: Secondary | ICD-10-CM | POA: Diagnosis not present

## 2020-10-01 DIAGNOSIS — Z8616 Personal history of COVID-19: Secondary | ICD-10-CM

## 2020-10-01 DIAGNOSIS — D5701 Hb-SS disease with acute chest syndrome: Secondary | ICD-10-CM | POA: Diagnosis not present

## 2020-10-01 DIAGNOSIS — Z888 Allergy status to other drugs, medicaments and biological substances status: Secondary | ICD-10-CM | POA: Diagnosis not present

## 2020-10-01 DIAGNOSIS — B9729 Other coronavirus as the cause of diseases classified elsewhere: Secondary | ICD-10-CM | POA: Diagnosis not present

## 2020-10-01 DIAGNOSIS — G894 Chronic pain syndrome: Secondary | ICD-10-CM | POA: Diagnosis not present

## 2020-10-01 DIAGNOSIS — X58XXXA Exposure to other specified factors, initial encounter: Secondary | ICD-10-CM | POA: Diagnosis present

## 2020-10-01 DIAGNOSIS — Z79899 Other long term (current) drug therapy: Secondary | ICD-10-CM

## 2020-10-01 DIAGNOSIS — R0602 Shortness of breath: Secondary | ICD-10-CM | POA: Diagnosis present

## 2020-10-01 DIAGNOSIS — I158 Other secondary hypertension: Secondary | ICD-10-CM | POA: Diagnosis not present

## 2020-10-01 DIAGNOSIS — J189 Pneumonia, unspecified organism: Secondary | ICD-10-CM | POA: Diagnosis not present

## 2020-10-01 DIAGNOSIS — D72829 Elevated white blood cell count, unspecified: Secondary | ICD-10-CM | POA: Diagnosis present

## 2020-10-01 DIAGNOSIS — R0902 Hypoxemia: Secondary | ICD-10-CM | POA: Diagnosis not present

## 2020-10-01 DIAGNOSIS — A419 Sepsis, unspecified organism: Principal | ICD-10-CM | POA: Diagnosis present

## 2020-10-01 DIAGNOSIS — J9691 Respiratory failure, unspecified with hypoxia: Secondary | ICD-10-CM | POA: Diagnosis not present

## 2020-10-01 DIAGNOSIS — Z832 Family history of diseases of the blood and blood-forming organs and certain disorders involving the immune mechanism: Secondary | ICD-10-CM

## 2020-10-01 DIAGNOSIS — Y95 Nosocomial condition: Secondary | ICD-10-CM | POA: Diagnosis present

## 2020-10-01 DIAGNOSIS — R519 Headache, unspecified: Secondary | ICD-10-CM | POA: Diagnosis not present

## 2020-10-01 DIAGNOSIS — E871 Hypo-osmolality and hyponatremia: Secondary | ICD-10-CM | POA: Diagnosis not present

## 2020-10-01 DIAGNOSIS — A403 Sepsis due to Streptococcus pneumoniae: Secondary | ICD-10-CM | POA: Diagnosis not present

## 2020-10-01 DIAGNOSIS — G44311 Acute post-traumatic headache, intractable: Secondary | ICD-10-CM | POA: Diagnosis not present

## 2020-10-01 DIAGNOSIS — R0689 Other abnormalities of breathing: Secondary | ICD-10-CM | POA: Diagnosis not present

## 2020-10-01 DIAGNOSIS — I10 Essential (primary) hypertension: Secondary | ICD-10-CM | POA: Diagnosis present

## 2020-10-01 DIAGNOSIS — Z8249 Family history of ischemic heart disease and other diseases of the circulatory system: Secondary | ICD-10-CM

## 2020-10-01 DIAGNOSIS — S0281XA Fracture of other specified skull and facial bones, right side, initial encounter for closed fracture: Secondary | ICD-10-CM | POA: Diagnosis present

## 2020-10-01 DIAGNOSIS — D57211 Sickle-cell/Hb-C disease with acute chest syndrome: Secondary | ICD-10-CM | POA: Diagnosis not present

## 2020-10-01 DIAGNOSIS — R52 Pain, unspecified: Secondary | ICD-10-CM | POA: Diagnosis not present

## 2020-10-01 DIAGNOSIS — J9601 Acute respiratory failure with hypoxia: Secondary | ICD-10-CM | POA: Diagnosis not present

## 2020-10-01 HISTORY — DX: Essential (primary) hypertension: I10

## 2020-10-01 LAB — CBC WITH DIFFERENTIAL/PLATELET
Abs Immature Granulocytes: 0.48 10*3/uL — ABNORMAL HIGH (ref 0.00–0.07)
Basophils Absolute: 0.1 10*3/uL (ref 0.0–0.1)
Basophils Relative: 1 %
Eosinophils Absolute: 0.1 10*3/uL (ref 0.0–0.5)
Eosinophils Relative: 0 %
HCT: 24.9 % — ABNORMAL LOW (ref 39.0–52.0)
Hemoglobin: 8.9 g/dL — ABNORMAL LOW (ref 13.0–17.0)
Immature Granulocytes: 3 %
Lymphocytes Relative: 17 %
Lymphs Abs: 3.2 10*3/uL (ref 0.7–4.0)
MCH: 30.5 pg (ref 26.0–34.0)
MCHC: 35.7 g/dL (ref 30.0–36.0)
MCV: 85.3 fL (ref 80.0–100.0)
Monocytes Absolute: 1.8 10*3/uL — ABNORMAL HIGH (ref 0.1–1.0)
Monocytes Relative: 10 %
Neutro Abs: 12.9 10*3/uL — ABNORMAL HIGH (ref 1.7–7.7)
Neutrophils Relative %: 69 %
Platelets: 341 10*3/uL (ref 150–400)
RBC: 2.92 MIL/uL — ABNORMAL LOW (ref 4.22–5.81)
RDW: 19 % — ABNORMAL HIGH (ref 11.5–15.5)
WBC: 18.6 10*3/uL — ABNORMAL HIGH (ref 4.0–10.5)
nRBC: 0.5 % — ABNORMAL HIGH (ref 0.0–0.2)

## 2020-10-01 LAB — COMPREHENSIVE METABOLIC PANEL
ALT: 21 U/L (ref 0–44)
AST: 46 U/L — ABNORMAL HIGH (ref 15–41)
Albumin: 4.4 g/dL (ref 3.5–5.0)
Alkaline Phosphatase: 93 U/L (ref 38–126)
Anion gap: 9 (ref 5–15)
BUN: 9 mg/dL (ref 6–20)
CO2: 26 mmol/L (ref 22–32)
Calcium: 9.1 mg/dL (ref 8.9–10.3)
Chloride: 105 mmol/L (ref 98–111)
Creatinine, Ser: 0.6 mg/dL — ABNORMAL LOW (ref 0.61–1.24)
GFR, Estimated: >60 mL/min (ref >60)
Glucose, Bld: 109 mg/dL — ABNORMAL HIGH (ref 70–99)
Potassium: 4.6 mmol/L (ref 3.5–5.1)
Sodium: 140 mmol/L (ref 135–145)
Total Bilirubin: 4.1 mg/dL — ABNORMAL HIGH (ref 0.3–1.2)
Total Protein: 7.5 g/dL (ref 6.5–8.1)

## 2020-10-01 LAB — BLOOD GAS, ARTERIAL
Acid-Base Excess: 0.1 mmol/L (ref 0.0–2.0)
Bicarbonate: 25.7 mmol/L (ref 20.0–28.0)
Drawn by: 11249
FIO2: 21
O2 Saturation: 93.4 %
Patient temperature: 99.1
pCO2 arterial: 50.4 mmHg — ABNORMAL HIGH (ref 32.0–48.0)
pH, Arterial: 7.329 — ABNORMAL LOW (ref 7.350–7.450)
pO2, Arterial: 89.9 mmHg (ref 83.0–108.0)

## 2020-10-01 LAB — LACTIC ACID, PLASMA: Lactic Acid, Venous: 1.4 mmol/L (ref 0.5–1.9)

## 2020-10-01 LAB — RETICULOCYTES
Immature Retic Fract: 35 % — ABNORMAL HIGH (ref 2.3–15.9)
RBC.: 2.92 MIL/uL — ABNORMAL LOW (ref 4.22–5.81)
Retic Count, Absolute: 213.2 10*3/uL — ABNORMAL HIGH (ref 19.0–186.0)
Retic Ct Pct: 7.3 % — ABNORMAL HIGH (ref 0.4–3.1)

## 2020-10-01 LAB — TROPONIN I (HIGH SENSITIVITY): Troponin I (High Sensitivity): 4 ng/L (ref <18)

## 2020-10-01 MED ORDER — HYDROMORPHONE HCL 2 MG/ML IJ SOLN
2.0000 mg | Freq: Once | INTRAMUSCULAR | Status: AC
  Administered 2020-10-01: 2 mg via INTRAVENOUS
  Filled 2020-10-01: qty 1

## 2020-10-01 MED ORDER — DIPHENHYDRAMINE HCL 25 MG PO CAPS
25.0000 mg | ORAL_CAPSULE | ORAL | Status: DC | PRN
  Administered 2020-10-06: 25 mg via ORAL
  Filled 2020-10-01: qty 1

## 2020-10-01 MED ORDER — SODIUM CHLORIDE 0.9 % IV SOLN
2.0000 g | Freq: Three times a day (TID) | INTRAVENOUS | Status: AC
  Administered 2020-10-01 – 2020-10-08 (×22): 2 g via INTRAVENOUS
  Filled 2020-10-01: qty 1
  Filled 2020-10-01 (×4): qty 2
  Filled 2020-10-01: qty 1
  Filled 2020-10-01 (×7): qty 2
  Filled 2020-10-01: qty 1
  Filled 2020-10-01 (×9): qty 2

## 2020-10-01 MED ORDER — HYDROMORPHONE HCL 2 MG/ML IJ SOLN
2.0000 mg | INTRAMUSCULAR | Status: AC
  Administered 2020-10-01: 2 mg via INTRAVENOUS
  Filled 2020-10-01: qty 1

## 2020-10-01 MED ORDER — SODIUM CHLORIDE 0.9 % IV SOLN
500.0000 mg | Freq: Once | INTRAVENOUS | Status: AC
  Administered 2020-10-01: 500 mg via INTRAVENOUS
  Filled 2020-10-01: qty 500

## 2020-10-01 MED ORDER — SODIUM CHLORIDE 0.45 % IV SOLN: INTRAVENOUS | Status: DC

## 2020-10-01 MED ORDER — HYDROMORPHONE 1 MG/ML IV SOLN
INTRAVENOUS | Status: DC
  Administered 2020-10-01: 30 mg via INTRAVENOUS
  Filled 2020-10-01: qty 30

## 2020-10-01 MED ORDER — NALOXONE HCL 0.4 MG/ML IJ SOLN: 0.4000 mg | INTRAMUSCULAR | Status: DC | PRN

## 2020-10-01 MED ORDER — KETOROLAC TROMETHAMINE 15 MG/ML IJ SOLN
15.0000 mg | INTRAMUSCULAR | Status: AC
  Administered 2020-10-01: 15 mg via INTRAVENOUS
  Filled 2020-10-01: qty 1

## 2020-10-01 MED ORDER — ACETAMINOPHEN 325 MG PO TABS
650.0000 mg | ORAL_TABLET | Freq: Four times a day (QID) | ORAL | Status: DC | PRN
  Administered 2020-10-03 – 2020-10-06 (×5): 650 mg via ORAL
  Filled 2020-10-01 (×5): qty 2

## 2020-10-01 MED ORDER — IOHEXOL 350 MG/ML SOLN
100.0000 mL | Freq: Once | INTRAVENOUS | Status: AC | PRN
  Administered 2020-10-01: 100 mL via INTRAVENOUS

## 2020-10-01 MED ORDER — ONDANSETRON HCL 4 MG/2ML IJ SOLN
4.0000 mg | INTRAMUSCULAR | Status: DC | PRN
  Administered 2020-10-01: 4 mg via INTRAVENOUS
  Filled 2020-10-01: qty 2

## 2020-10-01 MED ORDER — HYDROMORPHONE HCL 1 MG/ML IJ SOLN
1.0000 mg | INTRAMUSCULAR | Status: DC | PRN
  Administered 2020-10-01 – 2020-10-02 (×2): 1 mg via INTRAVENOUS
  Filled 2020-10-01 (×2): qty 1

## 2020-10-01 MED ORDER — SODIUM CHLORIDE 0.9 % IV SOLN
25.0000 mg | INTRAVENOUS | Status: DC | PRN
  Filled 2020-10-01: qty 0.5

## 2020-10-01 MED ORDER — SODIUM CHLORIDE 0.9 % IV SOLN
1.0000 g | Freq: Once | INTRAVENOUS | Status: AC
  Administered 2020-10-01: 1 g via INTRAVENOUS
  Filled 2020-10-01: qty 10

## 2020-10-01 MED ORDER — VANCOMYCIN HCL 1500 MG/300ML IV SOLN
1500.0000 mg | Freq: Once | INTRAVENOUS | Status: AC
  Administered 2020-10-02: 1500 mg via INTRAVENOUS
  Filled 2020-10-01 (×2): qty 300

## 2020-10-01 MED ORDER — DIPHENHYDRAMINE HCL 25 MG PO CAPS
25.0000 mg | ORAL_CAPSULE | ORAL | Status: DC | PRN
  Administered 2020-10-01 (×2): 25 mg via ORAL
  Filled 2020-10-01 (×2): qty 1

## 2020-10-01 MED ORDER — ACETAMINOPHEN 650 MG RE SUPP: 650.0000 mg | Freq: Four times a day (QID) | RECTAL | Status: DC | PRN

## 2020-10-01 MED ORDER — SODIUM CHLORIDE 0.9 % IV SOLN
500.0000 mg | INTRAVENOUS | Status: AC
  Administered 2020-10-02 – 2020-10-04 (×3): 500 mg via INTRAVENOUS
  Filled 2020-10-01 (×4): qty 500

## 2020-10-01 MED ORDER — SODIUM CHLORIDE 0.9% FLUSH: 9.0000 mL | INTRAVENOUS | Status: DC | PRN

## 2020-10-01 NOTE — ED Notes (Addendum)
Patient yelling out in pain. PA already made aware of need for pain medicine. Waiting for orders to be placed. Patient requesting to speak with supervisor.
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
## 2020-10-01 NOTE — Progress Notes (Signed)
Pharmacy Antibiotic Note  [name] Hill is a 19 y.o. male admitted on 10/01/2020 with generalized pain that he states is consistent with sickle cell pain except with the addition of some chest pain and productive cough.  Pharmacy has been consulted for cefepime and vancomycin dosing for pna  Plan: Vancomycin 1500mg  IV x 1 then 1gm q8h Cefepime 2gm IV q8h Azithromycin 500mg  IV q24h Follow renal function, cultures and clinical course  Height: 5\' 7"  (170.2 cm) Weight: 90.7 kg (200 lb) IBW/kg (Calculated) : 66.1  Temp (24hrs), Avg:98.1 F (36.7 C), Min:98.1 F (36.7 C), Max:98.1 F (36.7 C)  Recent Labs  Lab 10/01/20 1335 10/01/20 2045  WBC 18.6*  --   CREATININE 0.60*  --   LATICACIDVEN  --  1.4    Estimated Creatinine Clearance: 156.8 mL/min (A) (by C-G formula based on SCr of 0.6 mg/dL (L)).    Allergies  Allergen Reactions  . Lisinopril Cough    Antimicrobials this admission: 11/7 azithromycin 11/7 CTX x 1 11/7 cefepime >> 11/7 vanc >> Dose adjustments this admission:   Microbiology results: 11/7 BCx: 11/7  MRSA PCR:   Thank you for allowing pharmacy to be a part of this patient's care.  13/7 RPh 10/01/2020, 9:56 PM

## 2020-10-01 NOTE — ED Notes (Signed)
Patient back from CT.

## 2020-10-01 NOTE — ED Triage Notes (Signed)
Pt BIB EMS and presents with sickle cell pain crisis.  Pt reported generalized pain with the pain most severe in chest and back.  Pt also reported some SOB.  EMS gave pt 4mg  IV morphine and of NaCl. Pt reports some improvement to pain but pt is tearful on arrival to ED. Denies that this pain crisis is any different from other pain crisis. Pt also reports productive cough with green sputum. Denies having COVID vaccination.
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
## 2020-10-01 NOTE — ED Notes (Addendum)
Per PA patient does not need a COVID swab due to recently having the illness.
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
## 2020-10-01 NOTE — ED Notes (Signed)
Patient transported to CT 

## 2020-10-02 ENCOUNTER — Encounter (HOSPITAL_COMMUNITY): Payer: Self-pay | Admitting: Internal Medicine

## 2020-10-02 DIAGNOSIS — D5701 Hb-SS disease with acute chest syndrome: Secondary | ICD-10-CM

## 2020-10-02 DIAGNOSIS — J189 Pneumonia, unspecified organism: Secondary | ICD-10-CM

## 2020-10-02 DIAGNOSIS — D5700 Hb-SS disease with crisis, unspecified: Secondary | ICD-10-CM | POA: Diagnosis not present

## 2020-10-02 LAB — CBC WITH DIFFERENTIAL/PLATELET
Abs Immature Granulocytes: 0.6 10*3/uL — ABNORMAL HIGH (ref 0.00–0.07)
Basophils Absolute: 0.1 10*3/uL (ref 0.0–0.1)
Basophils Relative: 1 %
Eosinophils Absolute: 0.1 10*3/uL (ref 0.0–0.5)
Eosinophils Relative: 0 %
HCT: 23.6 % — ABNORMAL LOW (ref 39.0–52.0)
Hemoglobin: 8.4 g/dL — ABNORMAL LOW (ref 13.0–17.0)
Immature Granulocytes: 3 %
Lymphocytes Relative: 8 %
Lymphs Abs: 1.8 10*3/uL (ref 0.7–4.0)
MCH: 29.7 pg (ref 26.0–34.0)
MCHC: 35.6 g/dL (ref 30.0–36.0)
MCV: 83.4 fL (ref 80.0–100.0)
Monocytes Absolute: 2.6 10*3/uL — ABNORMAL HIGH (ref 0.1–1.0)
Monocytes Relative: 12 %
Neutro Abs: 16.4 10*3/uL — ABNORMAL HIGH (ref 1.7–7.7)
Neutrophils Relative %: 76 %
Platelets: 338 10*3/uL (ref 150–400)
RBC: 2.83 MIL/uL — ABNORMAL LOW (ref 4.22–5.81)
RDW: 19 % — ABNORMAL HIGH (ref 11.5–15.5)
WBC: 21.6 10*3/uL — ABNORMAL HIGH (ref 4.0–10.5)
nRBC: 2.3 % — ABNORMAL HIGH (ref 0.0–0.2)

## 2020-10-02 LAB — COMPREHENSIVE METABOLIC PANEL
ALT: 23 U/L (ref 0–44)
AST: 65 U/L — ABNORMAL HIGH (ref 15–41)
Albumin: 4.4 g/dL (ref 3.5–5.0)
Alkaline Phosphatase: 94 U/L (ref 38–126)
Anion gap: 10 (ref 5–15)
BUN: 13 mg/dL (ref 6–20)
CO2: 25 mmol/L (ref 22–32)
Calcium: 8.7 mg/dL — ABNORMAL LOW (ref 8.9–10.3)
Chloride: 102 mmol/L (ref 98–111)
Creatinine, Ser: 0.5 mg/dL — ABNORMAL LOW (ref 0.61–1.24)
GFR, Estimated: >60 mL/min (ref >60)
Glucose, Bld: 129 mg/dL — ABNORMAL HIGH (ref 70–99)
Potassium: 4.6 mmol/L (ref 3.5–5.1)
Sodium: 137 mmol/L (ref 135–145)
Total Bilirubin: 3.6 mg/dL — ABNORMAL HIGH (ref 0.3–1.2)
Total Protein: 7.5 g/dL (ref 6.5–8.1)

## 2020-10-02 LAB — MRSA PCR SCREENING: MRSA by PCR: NEGATIVE

## 2020-10-02 LAB — TROPONIN I (HIGH SENSITIVITY): Troponin I (High Sensitivity): 9 ng/L (ref <18)

## 2020-10-02 LAB — MAGNESIUM
Magnesium: 2 mg/dL (ref 1.7–2.4)
Magnesium: 2 mg/dL (ref 1.7–2.4)

## 2020-10-02 LAB — PREPARE RBC (CROSSMATCH)

## 2020-10-02 MED ORDER — LIP MEDEX EX OINT
TOPICAL_OINTMENT | CUTANEOUS | Status: DC | PRN
  Filled 2020-10-02: qty 7

## 2020-10-02 MED ORDER — OXYCODONE HCL 5 MG PO TABS
10.0000 mg | ORAL_TABLET | ORAL | Status: DC | PRN
  Administered 2020-10-06 – 2020-10-10 (×8): 10 mg via ORAL
  Filled 2020-10-02 (×9): qty 2

## 2020-10-02 MED ORDER — ORAL CARE MOUTH RINSE
15.0000 mL | Freq: Two times a day (BID) | OROMUCOSAL | Status: DC
  Administered 2020-10-02 – 2020-10-11 (×7): 15 mL via OROMUCOSAL

## 2020-10-02 MED ORDER — SODIUM CHLORIDE 0.9% FLUSH: 10.0000 mL | INTRAVENOUS | Status: DC | PRN

## 2020-10-02 MED ORDER — HYDROMORPHONE HCL 1 MG/ML IJ SOLN
2.0000 mg | Freq: Once | INTRAMUSCULAR | Status: AC
  Administered 2020-10-02: 2 mg via INTRAVENOUS
  Filled 2020-10-02: qty 2

## 2020-10-02 MED ORDER — HYDROMORPHONE 1 MG/ML IV SOLN
INTRAVENOUS | Status: DC
  Administered 2020-10-02: 30 mg via INTRAVENOUS
  Administered 2020-10-02: 8.2 mg via INTRAVENOUS

## 2020-10-02 MED ORDER — CHLORHEXIDINE GLUCONATE CLOTH 2 % EX PADS
6.0000 | MEDICATED_PAD | Freq: Every day | CUTANEOUS | Status: DC
  Administered 2020-10-02 – 2020-10-10 (×9): 6 via TOPICAL

## 2020-10-02 MED ORDER — KETOROLAC TROMETHAMINE 30 MG/ML IJ SOLN
15.0000 mg | Freq: Four times a day (QID) | INTRAMUSCULAR | Status: DC
  Administered 2020-10-02 – 2020-10-07 (×19): 15 mg via INTRAVENOUS
  Filled 2020-10-02 (×20): qty 1

## 2020-10-02 MED ORDER — ACETAMINOPHEN 325 MG PO TABS
650.0000 mg | ORAL_TABLET | Freq: Once | ORAL | Status: AC
  Administered 2020-10-02: 650 mg via ORAL
  Filled 2020-10-02: qty 2

## 2020-10-02 MED ORDER — HYDROMORPHONE 1 MG/ML IV SOLN
INTRAVENOUS | Status: DC
  Administered 2020-10-02: 2.5 mg via INTRAVENOUS
  Administered 2020-10-02: 4.7 mg via INTRAVENOUS
  Administered 2020-10-03: 30 mg via INTRAVENOUS
  Administered 2020-10-04 (×2): 1.5 mg via INTRAVENOUS
  Administered 2020-10-04: 2 mg via INTRAVENOUS
  Administered 2020-10-05 (×2): 0 mg via INTRAVENOUS
  Administered 2020-10-05: 30 mg via INTRAVENOUS
  Administered 2020-10-05: 0.05 mg via INTRAVENOUS
  Administered 2020-10-05: 0.5 mg via INTRAVENOUS
  Administered 2020-10-06: 1 mL via INTRAVENOUS
  Administered 2020-10-06: 3.5 mg via INTRAVENOUS
  Administered 2020-10-06: 1 mL via INTRAVENOUS
  Administered 2020-10-06: 5.5 mg via INTRAVENOUS
  Administered 2020-10-06: 2.5 mg via INTRAVENOUS
  Administered 2020-10-07: 1 mg via INTRAVENOUS
  Administered 2020-10-07: 1 mL via INTRAVENOUS
  Administered 2020-10-07: 2.5 mg via INTRAVENOUS
  Filled 2020-10-02 (×2): qty 30

## 2020-10-02 MED ORDER — SODIUM CHLORIDE 0.9% IV SOLUTION: Freq: Once | INTRAVENOUS | Status: AC

## 2020-10-02 MED ORDER — VANCOMYCIN HCL IN DEXTROSE 1-5 GM/200ML-% IV SOLN
1000.0000 mg | Freq: Three times a day (TID) | INTRAVENOUS | Status: DC
  Administered 2020-10-02: 1000 mg via INTRAVENOUS
  Filled 2020-10-02: qty 200

## 2020-10-02 MED ORDER — DIPHENHYDRAMINE HCL 50 MG/ML IJ SOLN
12.5000 mg | Freq: Once | INTRAMUSCULAR | Status: AC
  Administered 2020-10-02: 12.5 mg via INTRAVENOUS
  Filled 2020-10-02: qty 1

## 2020-10-02 NOTE — Progress Notes (Signed)
IVT consulted to remove 500cc of blood. Phlebotomy therapeutic done via RUA midline placed. Pt resting, no signs of distress.  Penni Bombard Anija Brickner,RN-VAST

## 2020-10-02 NOTE — Progress Notes (Signed)
Subjective: [name] Hill is a 19 year old male with a medical history significant for sickle cell disease, chronic pain syndrome, opiate dependence and tolerance, and history of anemia of chronic disease with frequent hospitalizations was admitted for hospital associated pneumonia in the setting of sickle cell pain crisis.  Today, patient's oxygen saturation is 87% on 5 L supplemental oxygen.  Respirations range from 20-28.  Patient states that he is hurting "all over".  Pain intensity is 10/10.  Patient states that he becomes short of breath with sitting up and talking.  He endorses central chest discomfort.  Also, tachycardic, heart rate 110s-120s. Patient denies any dizziness, paresthesias, urinary symptoms, nausea, vomiting, or diarrhea.  Objective:  Vital signs in last 24 hours:  Vitals:   10/02/20 0433 10/02/20 0607 10/02/20 0759 10/02/20 1100  BP:  122/66  (!) 145/86  Pulse:  (!) 108  (!) 108  Resp: 14 18 17  (!) 21  Temp:  98.9 F (37.2 C)  98.3 F (36.8 C)  TempSrc:  Oral  Axillary  SpO2: 93% 90% 90% 92%  Weight: 93.6 kg     Height:        Intake/Output from previous day:   Intake/Output Summary (Last 24 hours) at 10/02/2020 1229 Last data filed at 10/02/2020 1057 Gross per 24 hour  Intake 2695.17 ml  Output 2600 ml  Net 95.17 ml    Physical Exam Constitutional:      General: He is in acute distress.     Appearance: Normal appearance. He is obese. He is ill-appearing.  Eyes:     Pupils: Pupils are equal, round, and reactive to light.  Cardiovascular:     Rate and Rhythm: Normal rate and regular rhythm.     Pulses: Normal pulses.  Pulmonary:     Effort: Tachypnea and accessory muscle usage present.     Breath sounds: No decreased breath sounds, wheezing or rhonchi.  Abdominal:     General: Bowel sounds are normal.  Musculoskeletal:        General: Normal range of motion.  Skin:    General: Skin is warm.  Neurological:     General: No focal deficit present.      Mental Status: He is alert. Mental status is at baseline.  Psychiatric:        Mood and Affect: Mood normal.        Behavior: Behavior normal.        Thought Content: Thought content normal.        Judgment: Judgment normal.      Lab Results:  Basic Metabolic Panel:    Component Value Date/Time   NA 137 10/02/2020 0519   NA 142 06/26/2018 1416   K 4.6 10/02/2020 0519   CL 102 10/02/2020 0519   CO2 25 10/02/2020 0519   BUN 13 10/02/2020 0519   BUN 7 06/26/2018 1416   CREATININE 0.50 (L) 10/02/2020 0519   GLUCOSE 129 (H) 10/02/2020 0519   CALCIUM 8.7 (L) 10/02/2020 0519   CBC:    Component Value Date/Time   WBC 21.6 (H) 10/02/2020 0519   HGB 8.4 (L) 10/02/2020 0519   HGB 9.6 (L) 06/26/2018 1416   HCT 23.6 (L) 10/02/2020 0519   HCT 28.5 (L) 06/26/2018 1416   PLT 338 10/02/2020 0519   PLT 405 06/26/2018 1416   MCV 83.4 10/02/2020 0519   MCV 92 06/26/2018 1416   NEUTROABS 16.4 (H) 10/02/2020 0519   NEUTROABS 3.7 06/26/2018 1416   LYMPHSABS 1.8 10/02/2020 0519  LYMPHSABS 3.3 (H) 06/26/2018 1416   MONOABS 2.6 (H) 10/02/2020 0519   EOSABS 0.1 10/02/2020 0519   EOSABS 0.2 06/26/2018 1416   BASOSABS 0.1 10/02/2020 0519   BASOSABS 0.1 06/26/2018 1416    Recent Results (from the past 240 hour(s))  Culture, blood (routine x 2)     Status: None (Preliminary result)   Collection Time: 10/01/20  8:45 PM   Specimen: BLOOD  Result Value Ref Range Status   Specimen Description   Final    BLOOD LEFT ANTECUBITAL Performed at [facility], [address], [place], [place] [address]    Special Requests   Final    BOTTLES DRAWN AEROBIC AND ANAEROBIC Blood Culture adequate volume Performed at [facility], [address], [place], [place] [address]    Culture   Final    NO GROWTH < 12 HOURS Performed at [facility], [address], [place], [place] [address]    Report Status PENDING  Incomplete  Culture, blood (routine x 2)      Status: None (Preliminary result)   Collection Time: 10/01/20  8:45 PM   Specimen: BLOOD  Result Value Ref Range Status   Specimen Description   Final    BLOOD LEFT WRIST Performed at [facility] [address] [place], [place] [address]    Special Requests   Final    BOTTLES DRAWN AEROBIC AND ANAEROBIC Blood Culture adequate volume Performed at [facility], [address], [place], [place] [address]    Culture   Final    NO GROWTH < 12 HOURS Performed at [facility], [address], [place], [place] [address]    Report Status PENDING  Incomplete  MRSA PCR Screening     Status: None   Collection Time: 10/02/20 12:32 AM   Specimen: Nasopharyngeal  Result Value Ref Range Status   MRSA by PCR NEGATIVE NEGATIVE Final    Comment:        The GeneXpert MRSA Assay (FDA approved for NASAL specimens only), is one component of a comprehensive MRSA colonization surveillance program. It is not intended to diagnose MRSA infection nor to guide or monitor treatment for MRSA infections. Performed at [facility], [address] [place], [place] [address]     Studies/Results: CT Angio Chest PE W and/or Wo Contrast  Result Date: 10/01/2020 CLINICAL DATA:  19 year old male with history of sickle cell disease. Shortness of breath and chest pain. EXAM: CT ANGIOGRAPHY CHEST WITH CONTRAST TECHNIQUE: Multidetector CT imaging of the chest was performed using the standard protocol during bolus administration of intravenous contrast. Multiplanar CT image reconstructions and MIPs were obtained to evaluate the vascular anatomy. CONTRAST:  OMNIPAQUE IOHEXOL 350 MG/ML SOLN COMPARISON:  Chest CT 08/21/2020. FINDINGS: Cardiovascular: There are no filling defects within the pulmonary arterial tree to suggest pulmonary embolism. Heart size is mildly enlarged. There is no significant pericardial fluid, thickening or pericardial  calcification. No atherosclerotic calcifications in the thoracic aorta or the coronary arteries. Mediastinum/Nodes: No pathologically enlarged mediastinal or hilar lymph nodes. Esophagus is unremarkable in appearance. No axillary lymphadenopathy. Lungs/Pleura: Patchy areas of airspace consolidation and some volume loss are noted in the lower lobes of the lungs bilaterally, and in the posterior aspect of the left upper lobe. No pleural effusions. No definite suspicious appearing pulmonary nodules or masses are noted. Upper Abdomen: Unremarkable. Musculoskeletal: Multiple H-shaped vertebral bodies characteristic of sickle cell disease. There are  no aggressive appearing lytic or blastic lesions noted in the visualized portions of the skeleton. Review of the MIP images confirms the above findings. IMPRESSION: 1. No evidence of pulmonary embolism. 2. Patchy areas of consolidation and volume loss in the lungs bilaterally, indicative of acute chest syndrome in this patient with history of sickle cell disease. 3. Mild cardiomegaly. Electronically Signed   By: Trudie Reed M.D.   On: 10/01/2020 18:28   DG Chest Port 1 View  Result Date: 10/01/2020 CLINICAL DATA:  Sickle cell pain crisis. EXAM: PORTABLE CHEST 1 VIEW COMPARISON:  Chest radiograph 08/23/2020 FINDINGS: Stable cardiomediastinal contours with enlarged heart size. The lungs are clear. No pneumothorax or significant pleural effusion. No acute finding in the visualized skeleton. IMPRESSION: No acute cardiopulmonary finding. Electronically Signed   By: Emmaline Kluver M.D.   On: 10/01/2020 14:54    Medications: Scheduled Meds: . HYDROmorphone   Intravenous Q4H  .  HYDROmorphone (DILAUDID) injection  2 mg Intravenous Once  . ketorolac  15 mg Intravenous Q6H   Continuous Infusions: . sodium chloride 100 mL/hr at 10/01/20 2244  . azithromycin    . ceFEPime (MAXIPIME) IV 2 g (10/02/20 0754)  . diphenhydrAMINE     PRN Meds:.acetaminophen **OR**  acetaminophen, diphenhydrAMINE **OR** diphenhydrAMINE, lip balm, naloxone **AND** sodium chloride flush, ondansetron, oxyCODONE  Consultants:  None  Procedures:  None  Antibiotics:  IV cefepime  IV azithromycin  Assessment/Plan: Principal Problem:   Acute chest syndrome due to sickle cell crisis (HCC) Active Problems:   Sepsis (HCC)   Sickle cell pain crisis (HCC)   Leukocytosis   Hypertension   SOB (shortness of breath)   Respiratory failure with hypoxia (HCC)   HCAP (healthcare-associated pneumonia)  Sickle cell pain crisis with probable acute chest syndrome: Oxygen saturations 87% despite 5 L supplemental oxygen.  Patient endorses shortness of breath.  Most recent chest CT shows no evidence of pulmonary embolism.  However there are patchy areas of consolidation and volume loss in the lungs bilaterally which is consistent with acute chest syndrome. Patient's hemoglobin is currently 8.4, he warrants exchange transfusion on today.  Remove 500 cc and transfuse 1 unit PRBCs. Continue broad-spectrum antibiotic coverage Follow CBC in a.m. Pain control with IV Dilaudid via PCA.  Settings changed to 0.5 mg, 10-minute lockout, and 3 mg/h. Also, oxycodone 10 mg every 4 hours as needed for severe breakthrough pain  Monitor vital signs closely, reevaluate pain scale regularly, and supplemental oxygen as needed  Sepsis due to HCAP pneumonia : Patient continues to have shortness of breath.  Oxygen saturation below 90%.  Continue supplemental oxygen. Transition to stepdown Continue broad-spectrum antibiotics.  Leukocytosis: WBCs 21.6.  Patient is afebrile today.  Continue antibiotics.  Monitor closely.  Blood cultures negative so far.  Repeat CBC in a.m.  Sickle cell anemia: Hemoglobin 8.4, consistent with patient's baseline.  Will undergo exchange transfusion today.  Follow CBC in a.m.  Chronic pain syndrome: Continue home medications   Respiratory failure with hypoxia:   Patient's oxygen needs are increased.  He is currently requiring 5 L supplemental oxygen to maintain oxygen saturation above 90%.   Code Status: Full Code Family Communication: N/A Disposition Plan: Not yet ready for discharge. Transfer to stepdown for closer monitoring of hemodynamic stability.     Nolon Nations  APRN, MSN, FNP-C Patient Care [facility] [address] [place], [place] [address] [phone]  If 5PM-8AM, please contact night-coverage.  10/02/2020, 12:29 PM  LOS:  1 day

## 2020-10-02 NOTE — Progress Notes (Signed)
Patient transferred to stepdown for closer monitoring, Patient alert/oriented, continue to C/O generalized pain.

## 2020-10-03 DIAGNOSIS — D5701 Hb-SS disease with acute chest syndrome: Secondary | ICD-10-CM | POA: Diagnosis not present

## 2020-10-03 DIAGNOSIS — J189 Pneumonia, unspecified organism: Secondary | ICD-10-CM | POA: Diagnosis not present

## 2020-10-03 LAB — CBC
HCT: 24.4 % — ABNORMAL LOW (ref 39.0–52.0)
Hemoglobin: 8.8 g/dL — ABNORMAL LOW (ref 13.0–17.0)
MCH: 30.1 pg (ref 26.0–34.0)
MCHC: 36.1 g/dL — ABNORMAL HIGH (ref 30.0–36.0)
MCV: 83.6 fL (ref 80.0–100.0)
Platelets: 281 10*3/uL (ref 150–400)
RBC: 2.92 MIL/uL — ABNORMAL LOW (ref 4.22–5.81)
RDW: 18.4 % — ABNORMAL HIGH (ref 11.5–15.5)
WBC: 14.4 10*3/uL — ABNORMAL HIGH (ref 4.0–10.5)
nRBC: 7.8 % — ABNORMAL HIGH (ref 0.0–0.2)

## 2020-10-03 LAB — BASIC METABOLIC PANEL
Anion gap: 8 (ref 5–15)
BUN: 11 mg/dL (ref 6–20)
CO2: 25 mmol/L (ref 22–32)
Calcium: 8.2 mg/dL — ABNORMAL LOW (ref 8.9–10.3)
Chloride: 97 mmol/L — ABNORMAL LOW (ref 98–111)
Creatinine, Ser: 0.59 mg/dL — ABNORMAL LOW (ref 0.61–1.24)
GFR, Estimated: >60 mL/min (ref >60)
Glucose, Bld: 126 mg/dL — ABNORMAL HIGH (ref 70–99)
Potassium: 4.4 mmol/L (ref 3.5–5.1)
Sodium: 130 mmol/L — ABNORMAL LOW (ref 135–145)

## 2020-10-03 LAB — LEGIONELLA PNEUMOPHILA SEROGP 1 UR AG: L. pneumophila Serogp 1 Ur Ag: NEGATIVE

## 2020-10-03 LAB — STREP PNEUMONIAE URINARY ANTIGEN: Strep Pneumo Urinary Antigen: NEGATIVE

## 2020-10-03 MED ORDER — LOSARTAN POTASSIUM 50 MG PO TABS
50.0000 mg | ORAL_TABLET | Freq: Every day | ORAL | Status: DC
  Administered 2020-10-03 – 2020-10-10 (×8): 50 mg via ORAL
  Filled 2020-10-03 (×8): qty 1

## 2020-10-03 MED ORDER — LABETALOL HCL 5 MG/ML IV SOLN
10.0000 mg | INTRAVENOUS | Status: DC | PRN
  Filled 2020-10-03: qty 4

## 2020-10-03 MED ORDER — LABETALOL HCL 5 MG/ML IV SOLN
10.0000 mg | Freq: Once | INTRAVENOUS | Status: AC
  Administered 2020-10-03: 10 mg via INTRAVENOUS
  Filled 2020-10-03: qty 4

## 2020-10-03 MED ORDER — IPRATROPIUM-ALBUTEROL 0.5-2.5 (3) MG/3ML IN SOLN: 3.0000 mL | RESPIRATORY_TRACT | Status: DC | PRN

## 2020-10-03 NOTE — Progress Notes (Signed)
Subjective: [name] Hill is a 19 year old is a 19 year old male with a medical history significant for sickle cell disease, chronic pain syndrome, opiate dependence and tolerance, and history of anemia of chronic disease with frequent hospitalizations was admitted for hospital associated pneumonia in the setting of sickle cell pain crisis.  Patient says that he feels much better today. Pain has improved.  Pain intensity is 3/10 primarily to lower back. Patient continues to endorse shortness of breath with exertion. Current oxygen saturation is 95% on 5 L. Patient afebrile overnight.  Patient denies headache, chest pain, urinary symptoms, nausea, vomiting, or diarrhea.   Objective:  Vital signs in last 24 hours:  Vitals:   10/03/20 1000 10/03/20 1100 10/03/20 1143 10/03/20 1200  BP: (!) 153/94 (!) 164/94  (!) 165/102  Pulse: (!) 106 (!) 107  96  Resp: (!) 31 (!) 32 (!) 34 (!) 35  Temp:    99.5 F (37.5 C)  TempSrc:    Oral  SpO2: 90% 90% 92% 93%  Weight:      Height:        Intake/Output from previous day:   Intake/Output Summary (Last 24 hours) at 10/03/2020 1327 Last data filed at 10/03/2020 1145 Gross per 24 hour  Intake 2110.04 ml  Output 4050 ml  Net -1939.96 ml    Physical Exam: General: Alert, awake, oriented x3, in no distress HEENT: [place]/AT PEERL, EOMI Neck: Trachea midline,  no masses, no thyromegal,y no JVD, no carotid bruit OROPHARYNX:  Moist, No exudate/ erythema/lesions.  Heart: Regular rate and rhythm, without murmurs, rubs, gallops, PMI non-displaced, no heaves or thrills on palpation.  Lungs: Clear to auscultation, no wheezing or rhonchi noted. No increased vocal fremitus resonant to percussion  Abdomen: Soft, nontender, nondistended, positive bowel sounds, no masses no hepatosplenomegaly noted..  Neuro: No focal neurological deficits noted cranial nerves II through XII grossly intact. DTRs 2+ bilaterally upper and lower extremities. Strength 5 out of 5 in  bilateral upper and lower extremities. Musculoskeletal: No warm swelling or erythema around joints, no spinal tenderness noted. Psychiatric: Patient alert and oriented x3, good insight and cognition, good recent to remote recall. Lymph node survey: No cervical axillary or inguinal lymphadenopathy noted.  Lab Results:  Basic Metabolic Panel:    Component Value Date/Time   NA 137 10/02/2020 0519   NA 142 06/26/2018 1416   K 4.6 10/02/2020 0519   CL 102 10/02/2020 0519   CO2 25 10/02/2020 0519   BUN 13 10/02/2020 0519   BUN 7 06/26/2018 1416   CREATININE 0.50 (L) 10/02/2020 0519   GLUCOSE 129 (H) 10/02/2020 0519   CALCIUM 8.7 (L) 10/02/2020 0519   CBC:    Component Value Date/Time   WBC 21.6 (H) 10/02/2020 0519   HGB 8.4 (L) 10/02/2020 0519   HGB 9.6 (L) 06/26/2018 1416   HCT 23.6 (L) 10/02/2020 0519   HCT 28.5 (L) 06/26/2018 1416   PLT 338 10/02/2020 0519   PLT 405 06/26/2018 1416   MCV 83.4 10/02/2020 0519   MCV 92 06/26/2018 1416   NEUTROABS 16.4 (H) 10/02/2020 0519   NEUTROABS 3.7 06/26/2018 1416   LYMPHSABS 1.8 10/02/2020 0519   LYMPHSABS 3.3 (H) 06/26/2018 1416   MONOABS 2.6 (H) 10/02/2020 0519   EOSABS 0.1 10/02/2020 0519   EOSABS 0.2 06/26/2018 1416   BASOSABS 0.1 10/02/2020 0519   BASOSABS 0.1 06/26/2018 1416    Recent Results (from the past 240 hour(s))  Culture, blood (routine x 2)  Status: None (Preliminary result)   Collection Time: 10/01/20  8:45 PM   Specimen: BLOOD  Result Value Ref Range Status   Specimen Description   Final    BLOOD LEFT ANTECUBITAL Performed at [facility], [address], [place], [place] [address]    Special Requests   Final    BOTTLES DRAWN AEROBIC AND ANAEROBIC Blood Culture adequate volume Performed at [facility], [address] [place], [place] [address]    Culture   Final    NO GROWTH 2 DAYS Performed at [facility], [address], [place], [place] [address]     Report Status PENDING  Incomplete  Culture, blood (routine x 2)     Status: None (Preliminary result)   Collection Time: 10/01/20  8:45 PM   Specimen: BLOOD  Result Value Ref Range Status   Specimen Description   Final    BLOOD LEFT WRIST Performed at [facility], [address], [place], [place] [address]    Special Requests   Final    BOTTLES DRAWN AEROBIC AND ANAEROBIC Blood Culture adequate volume Performed at [facility], [address] [place], [place] [address]    Culture   Final    NO GROWTH 2 DAYS Performed at [facility], [address] [place], [place] [address]    Report Status PENDING  Incomplete  MRSA PCR Screening     Status: None   Collection Time: 10/02/20 12:32 AM   Specimen: Nasopharyngeal  Result Value Ref Range Status   MRSA by PCR NEGATIVE NEGATIVE Final    Comment:        The GeneXpert MRSA Assay (FDA approved for NASAL specimens only), is one component of a comprehensive MRSA colonization surveillance program. It is not intended to diagnose MRSA infection nor to guide or monitor treatment for MRSA infections. Performed at [facility], [address] [place], [place] [address]     Studies/Results: CT Angio Chest PE W and/or Wo Contrast  Result Date: 10/01/2020 CLINICAL DATA:  19 year old male with history of sickle cell disease. Shortness of breath and chest pain. EXAM: CT ANGIOGRAPHY CHEST WITH CONTRAST TECHNIQUE: Multidetector CT imaging of the chest was performed using the standard protocol during bolus administration of intravenous contrast. Multiplanar CT image reconstructions and MIPs were obtained to evaluate the vascular anatomy. CONTRAST:  OMNIPAQUE IOHEXOL 350 MG/ML SOLN COMPARISON:  Chest CT 08/21/2020. FINDINGS: Cardiovascular: There are no filling defects within the pulmonary arterial tree to suggest pulmonary embolism. Heart size is mildly enlarged. There is no  significant pericardial fluid, thickening or pericardial calcification. No atherosclerotic calcifications in the thoracic aorta or the coronary arteries. Mediastinum/Nodes: No pathologically enlarged mediastinal or hilar lymph nodes. Esophagus is unremarkable in appearance. No axillary lymphadenopathy. Lungs/Pleura: Patchy areas of airspace consolidation and some volume loss are noted in the lower lobes of the lungs bilaterally, and in the posterior aspect of the left upper lobe. No pleural effusions. No definite suspicious appearing pulmonary nodules or masses are noted. Upper Abdomen: Unremarkable. Musculoskeletal: Multiple H-shaped vertebral bodies characteristic of sickle cell disease. There are no aggressive appearing lytic or blastic lesions noted in the visualized portions of the skeleton. Review of the MIP images confirms the above findings. IMPRESSION: 1. No evidence of pulmonary embolism. 2. Patchy areas of consolidation and volume loss in the lungs bilaterally, indicative of acute chest syndrome in this patient with history of sickle cell disease. 3.  Mild cardiomegaly. Electronically Signed   By: Trudie Reed M.D.   On: 10/01/2020 18:28   DG Chest Port 1 View  Result Date: 10/01/2020 CLINICAL DATA:  Sickle cell pain crisis. EXAM: PORTABLE CHEST 1 VIEW COMPARISON:  Chest radiograph 08/23/2020 FINDINGS: Stable cardiomediastinal contours with enlarged heart size. The lungs are clear. No pneumothorax or significant pleural effusion. No acute finding in the visualized skeleton. IMPRESSION: No acute cardiopulmonary finding. Electronically Signed   By: Emmaline Kluver M.D.   On: 10/01/2020 14:54    Medications: Scheduled Meds: . Chlorhexidine Gluconate Cloth  6 each Topical Daily  . HYDROmorphone   Intravenous Q4H  . ketorolac  15 mg Intravenous Q6H  . labetalol  10 mg Intravenous Once  . losartan  50 mg Oral Daily  . mouth rinse  15 mL Mouth Rinse BID   Continuous Infusions: . sodium  chloride 100 mL/hr at 10/03/20 1043  . azithromycin Stopped (10/02/20 2035)  . ceFEPime (MAXIPIME) IV Stopped (10/03/20 0558)  . diphenhydrAMINE     PRN Meds:.acetaminophen **OR** acetaminophen, diphenhydrAMINE **OR** diphenhydrAMINE, ipratropium-albuterol, lip balm, naloxone **AND** sodium chloride flush, ondansetron, oxyCODONE, sodium chloride flush  Consultants:  None  Procedures:  None  Antibiotics:  IV cefepime  IV azithromycin  Assessment/Plan: Principal Problem:   Acute chest syndrome due to sickle cell crisis (HCC) Active Problems:   Sepsis (HCC)   Sickle cell pain crisis (HCC)   Leukocytosis   Acute chest syndrome (HCC)   Hypertension   SOB (shortness of breath)   Respiratory failure with hypoxia (HCC)   HCAP (healthcare-associated pneumonia)  Sickle cell pain crisis with acute chest syndrome: Patient's oxygen requirements are 7 L to maintain oxygen saturation above 90%. Most recent chest x-ray shows patchy areas of consolidation that is consistent with acute chest syndrome. Patient is status post exchange transfusion on 10/02/2020 CBC pending.  Is also stable, available. Continue broad-spectrum antibiotic coverage Pain control with IV Dilaudid via PCA, no changes in settings on today. Oxycodone 10 mg every 4 hours as needed for severe breakthrough. Monitor vital signs closely, reevaluate pain scale regularly, and supplemental oxygen as needed.  Sepsis due to HCAP pneumonia: Continues to endorse shortness of breath with exertion.  Patient has an oxygen requirement of 7 L.  Continue broad-spectrum antibiotics, spirometry.  Leukocytosis, Stable.  Patient is afebrile.  Continue IV antibiotics.  Monitor closely.  Blood cultures continue to be negative. CBC in a.m.  Sickle cell anemia: Patient is status post exchange transfusion on 10/02/2020.  No further transfusion warranted at this time.   Hypertension:  Restart home medications. Losartan 50 mg daily.   Labetalol 10 mg every 2 hours as needed If systolic greater than 170 and/or diastolic greater than 100   Respiratory failure with hypoxia: Continue close monitoring. Titrate supplemental oxygen as tolerated.    Code Status: Full Code Family Communication: N/A Disposition Plan: Not yet ready for discharge   Courtlyn Aki Rennis Petty  APRN, MSN, FNP-C [facility] [address] [place], [place] [address] [phone]  If 5PM-8AM, please contact night-coverage.  10/03/2020, 1:27 PM  LOS: 2 days
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
## 2020-10-04 DIAGNOSIS — D5700 Hb-SS disease with crisis, unspecified: Secondary | ICD-10-CM | POA: Diagnosis not present

## 2020-10-04 DIAGNOSIS — J189 Pneumonia, unspecified organism: Secondary | ICD-10-CM | POA: Diagnosis not present

## 2020-10-04 DIAGNOSIS — J9601 Acute respiratory failure with hypoxia: Secondary | ICD-10-CM | POA: Diagnosis not present

## 2020-10-04 DIAGNOSIS — D5701 Hb-SS disease with acute chest syndrome: Secondary | ICD-10-CM | POA: Diagnosis not present

## 2020-10-04 LAB — CBC
HCT: 24.6 % — ABNORMAL LOW (ref 39.0–52.0)
Hemoglobin: 8.9 g/dL — ABNORMAL LOW (ref 13.0–17.0)
MCH: 30.2 pg (ref 26.0–34.0)
MCHC: 36.2 g/dL — ABNORMAL HIGH (ref 30.0–36.0)
MCV: 83.4 fL (ref 80.0–100.0)
Platelets: 256 10*3/uL (ref 150–400)
RBC: 2.95 MIL/uL — ABNORMAL LOW (ref 4.22–5.81)
RDW: 18.6 % — ABNORMAL HIGH (ref 11.5–15.5)
WBC: 14.7 10*3/uL — ABNORMAL HIGH (ref 4.0–10.5)
nRBC: 5.8 % — ABNORMAL HIGH (ref 0.0–0.2)

## 2020-10-04 MED ORDER — METHYLPREDNISOLONE SODIUM SUCC 125 MG IJ SOLR
60.0000 mg | INTRAMUSCULAR | Status: DC
  Administered 2020-10-04 – 2020-10-06 (×3): 60 mg via INTRAVENOUS
  Filled 2020-10-04 (×3): qty 2

## 2020-10-04 NOTE — Progress Notes (Signed)
Pharmacy Antibiotic Note  [name] Hill is a 19 y.o. male admitted on 10/01/2020 with generalized pain that he states is consistent with sickle cell pain except with the addition of some chest pain and productive cough.  Pharmacy has been consulted for cefepime and vancomycin dosing for pna  10/04/20   vanc d/c  Renal function stable   Cultures unrevealing   Plan: Continue cefepime 2 g iv q 8 hours Azithromycin 500mg  IV q24h, will discuss stopping after today with provider Will follow remotely for renal function, cultures and clinical course  Height: 5\' 7"  (170.2 cm) Weight: 94.3 kg (207 lb 14.3 oz) IBW/kg (Calculated) : 66.1  Temp (24hrs), Avg:99.7 F (37.6 C), Min:98.5 F (36.9 C), Max:101 F (38.3 C)  Recent Labs  Lab 10/01/20 1335 10/01/20 2045 10/02/20 0519 10/03/20 1700 10/04/20 0756  WBC 18.6*  --  21.6* 14.4* 14.7*  CREATININE 0.60*  --  0.50* 0.59*  --   LATICACIDVEN  --  1.4  --   --   --     Estimated Creatinine Clearance: 159.9 mL/min (A) (by C-G formula based on SCr of 0.59 mg/dL (L)).    Allergies  Allergen Reactions  . Lisinopril Cough    Thank you for allowing pharmacy to be a part of this patient's care.  13/09/21, PharmD, BCPS Pharmacy [phone] 10/04/2020, 11:40 AM

## 2020-10-04 NOTE — Progress Notes (Addendum)
Subjective: [name] Hill is a 19 year old male with a medical history significant for sickle cell disease type SS, chronic pain syndrome, opiate dependence and tolerance, and history of anemia of chronic disease was admitted for hospital associated pneumonia in the setting of sickle cell pain crisis.  Patient says that he feels better today. He says that shortness of breath has improved. Currently, pain intensity is 4/10, primarily to right chest and low back.  Patient denies any headache, dizziness, urinary symptoms, nausea, vomiting, or diarrhea.  Objective:  Vital signs in last 24 hours:  Vitals:   10/04/20 1244 10/04/20 1300 10/04/20 1400 10/04/20 1500  BP:  (!) 156/88 125/64 (!) 153/78  Pulse:  87 81 87  Resp: 20   (!) 21  Temp:      TempSrc:      SpO2: 98% 97% 98% 98%  Weight:      Height:        Intake/Output from previous day:   Intake/Output Summary (Last 24 hours) at 10/04/2020 1545 Last data filed at 10/04/2020 0851 Gross per 24 hour  Intake 2178.33 ml  Output 3200 ml  Net -1021.67 ml    Physical Exam: General: Alert, awake, oriented x3, in no acute distress.  HEENT: /AT [place]ERL, EOMI. Scleral icterus Neck: Trachea midline,  no masses, no thyromegal,y no JVD, no carotid bruit OROPHARYNX:  Moist, No exudate/ erythema/lesions.  Heart: Regular rate and rhythm, without murmurs, rubs, gallops, PMI non-displaced, no heaves or thrills on palpation.  Lungs: Clear to auscultation, no wheezing or rhonchi noted. No increased vocal fremitus resonant to percussion  Abdomen: Soft, nontender, nondistended, positive bowel sounds, no masses no hepatosplenomegaly noted..  Neuro: No focal neurological deficits noted cranial nerves II through XII grossly intact. DTRs 2+ bilaterally upper and lower extremities. Strength 5 out of 5 in bilateral upper and lower extremities. Musculoskeletal: No warm swelling or erythema around joints, no spinal tenderness noted. Psychiatric: Patient alert  and oriented x3, good insight and cognition, good recent to remote recall. Lymph node survey: No cervical axillary or inguinal lymphadenopathy noted.  Lab Results:  Basic Metabolic Panel:    Component Value Date/Time   NA 130 (L) 10/03/2020 1700   NA 142 06/26/2018 1416   K 4.4 10/03/2020 1700   CL 97 (L) 10/03/2020 1700   CO2 25 10/03/2020 1700   BUN 11 10/03/2020 1700   BUN 7 06/26/2018 1416   CREATININE 0.59 (L) 10/03/2020 1700   GLUCOSE 126 (H) 10/03/2020 1700   CALCIUM 8.2 (L) 10/03/2020 1700   CBC:    Component Value Date/Time   WBC 14.7 (H) 10/04/2020 0756   HGB 8.9 (L) 10/04/2020 0756   HGB 9.6 (L) 06/26/2018 1416   HCT 24.6 (L) 10/04/2020 0756   HCT 28.5 (L) 06/26/2018 1416   PLT 256 10/04/2020 0756   PLT 405 06/26/2018 1416   MCV 83.4 10/04/2020 0756   MCV 92 06/26/2018 1416   NEUTROABS 16.4 (H) 10/02/2020 0519   NEUTROABS 3.7 06/26/2018 1416   LYMPHSABS 1.8 10/02/2020 0519   LYMPHSABS 3.3 (H) 06/26/2018 1416   MONOABS 2.6 (H) 10/02/2020 0519   EOSABS 0.1 10/02/2020 0519   EOSABS 0.2 06/26/2018 1416   BASOSABS 0.1 10/02/2020 0519   BASOSABS 0.1 06/26/2018 1416    Recent Results (from the past 240 hour(s))  Culture, blood (routine x 2)     Status: None (Preliminary result)   Collection Time: 10/01/20  8:45 PM   Specimen: BLOOD  Result Value Ref  Range Status   Specimen Description   Final    BLOOD LEFT ANTECUBITAL Performed at [facility], [address] [place], [place] [address]    Special Requests   Final    BOTTLES DRAWN AEROBIC AND ANAEROBIC Blood Culture adequate volume Performed at [facility], [address] [place], [place] [address]    Culture   Final    NO GROWTH 3 DAYS Performed at [facility], [address], [place], [place] [address]    Report Status PENDING  Incomplete  Culture, blood (routine x 2)     Status: None (Preliminary result)   Collection Time: 10/01/20  8:45 PM   Specimen:  BLOOD  Result Value Ref Range Status   Specimen Description   Final    BLOOD LEFT WRIST Performed at [facility], [address] [place], [place] [address]    Special Requests   Final    BOTTLES DRAWN AEROBIC AND ANAEROBIC Blood Culture adequate volume Performed at [facility], [address] [place], [place] [address]    Culture   Final    NO GROWTH 3 DAYS Performed at [facility], [address] [place], [place] [address]    Report Status PENDING  Incomplete  MRSA PCR Screening     Status: None   Collection Time: 10/02/20 12:32 AM   Specimen: Nasopharyngeal  Result Value Ref Range Status   MRSA by PCR NEGATIVE NEGATIVE Final    Comment:        The GeneXpert MRSA Assay (FDA approved for NASAL specimens only), is one component of a comprehensive MRSA colonization surveillance program. It is not intended to diagnose MRSA infection nor to guide or monitor treatment for MRSA infections. Performed at [facility], [address] [place], [place] [address]     Studies/Results: No results found.  Medications: Scheduled Meds: . Chlorhexidine Gluconate Cloth  6 each Topical Daily  . HYDROmorphone   Intravenous Q4H  . ketorolac  15 mg Intravenous Q6H  . losartan  50 mg Oral Daily  . mouth rinse  15 mL Mouth Rinse BID  . methylPREDNISolone sodium succinate  60 mg Intravenous Q24H   Continuous Infusions: . sodium chloride 100 mL/hr at 10/03/20 2047  . azithromycin Stopped (10/03/20 2042)  . ceFEPime (MAXIPIME) IV Stopped (10/04/20 1513)  . diphenhydrAMINE     PRN Meds:.acetaminophen **OR** acetaminophen, diphenhydrAMINE **OR** diphenhydrAMINE, ipratropium-albuterol, labetalol, lip balm, naloxone **AND** sodium chloride flush, ondansetron, oxyCODONE, sodium chloride flush  Consultants:  None  Procedures:  None  Antibiotics:  IV Cefepime  IV Azithromycin  Assessment/Plan: Principal Problem:    Acute chest syndrome due to sickle cell crisis (HCC) Active Problems:   Sepsis (HCC)   Sickle cell pain crisis (HCC)   Leukocytosis   Acute chest syndrome (HCC)   Hypertension   SOB (shortness of breath)   Respiratory failure with hypoxia (HCC)   HCAP (healthcare-associated pneumonia) Sickle cell pain crisis with acute chest syndrome: Patient's oxygen requirements have improved, oxygen saturation is 96% on 5 L.  Most recent chest x-ray showed patchy areas of consolidation is consistent with acute chest syndrome.  Repeat chest x-ray in a.m.  Patient is status post exchange transfusion on 10/02/2020.  Hemoglobin is 8.9, which is  improved and is consistent with patient's baseline. Continue broad-spectrum antibiotic coverage Pain control with IV Dilaudid via PCA, decrease settings Oxycodone 10 mg every 4 hours as needed for severe  breakthrough pain Monitor vital signs closely, reevaluate pain scale regularly, and supplemental oxygen as needed.  Sepsis due to HCAP: Shortness of breath with exertion has improved some  Oxygen requirements slowly improving.  Wean to room air as tolerated.  Continue IV antibiotics. Continue solumedrol 60 mg daily.  Incentive spirometry encouraged  Leukocytosis: WBCs completely, currently 14.7.  Patient is afebrile.  IV antibiotics continued.  Blood cultures continue to be negative.  Follow CBC in a.m.  Sickle cell anemia: Hemoglobin stable and consistent with patient's baseline.  Patient is s/p exchange transfusion on 10/02/2020.  No further transfusion warranted at this time.  Hypertension: Continue losartan 50 mg daily Labetalol 10 mg every 2 hours as needed for systolic BP greater than 170 and/or diastolic BP greater than 100.  Respiratory failure with hypoxia: Continue close monitoring.  Decrease supplemental oxygen as tolerated.  Code Status: Full Code Family Communication: N/A Disposition Plan: Not yet ready for discharge  Dawt Reeb Rennis Petty   APRN, MSN, FNP-C [facility] [address] [place], [place] [address] [phone]  If 5PM-7AM, please contact night-coverage.  10/04/2020, 3:45 PM  LOS: 3 days

## 2020-10-05 LAB — BASIC METABOLIC PANEL
Anion gap: 8 (ref 5–15)
BUN: 18 mg/dL (ref 6–20)
CO2: 26 mmol/L (ref 22–32)
Calcium: 8.6 mg/dL — ABNORMAL LOW (ref 8.9–10.3)
Chloride: 103 mmol/L (ref 98–111)
Creatinine, Ser: 0.52 mg/dL — ABNORMAL LOW (ref 0.61–1.24)
GFR, Estimated: >60 mL/min (ref >60)
Glucose, Bld: 106 mg/dL — ABNORMAL HIGH (ref 70–99)
Potassium: 4.2 mmol/L (ref 3.5–5.1)
Sodium: 137 mmol/L (ref 135–145)

## 2020-10-05 LAB — CBC
HCT: 22.7 % — ABNORMAL LOW (ref 39.0–52.0)
Hemoglobin: 8.2 g/dL — ABNORMAL LOW (ref 13.0–17.0)
MCH: 30.1 pg (ref 26.0–34.0)
MCHC: 36.1 g/dL — ABNORMAL HIGH (ref 30.0–36.0)
MCV: 83.5 fL (ref 80.0–100.0)
Platelets: 282 10*3/uL (ref 150–400)
RBC: 2.72 MIL/uL — ABNORMAL LOW (ref 4.22–5.81)
RDW: 19.9 % — ABNORMAL HIGH (ref 11.5–15.5)
WBC: 15.9 10*3/uL — ABNORMAL HIGH (ref 4.0–10.5)
nRBC: 6.7 % — ABNORMAL HIGH (ref 0.0–0.2)

## 2020-10-05 NOTE — Progress Notes (Signed)
Subjective: [name] Hill is a 19 year old male with a medical history significant for sickle cell disease type SS, chronic pain syndrome, opiate dependence and tolerance, and history of anemia of chronic disease was admitted for hospital associated pneumonia in the setting of sickle cell pain crisis.  Patient has no new complaints on today.  Pain is primarily to low back and right chest.  Pain intensity is 4/10.  Patient says that shortness of breath has improved.  He has been ambulating without assistance.  Patient denies any headache, dizziness, urinary symptoms, nausea, vomiting, or diarrhea.  Objective:  Vital signs in last 24 hours:  Vitals:   10/05/20 0543 10/05/20 0640 10/05/20 1043 10/05/20 1151  BP:  (!) 152/64 122/73   Pulse:  78 79   Resp: 20 20 (!) 24 20  Temp:  97.8 F (36.6 C) 99.1 F (37.3 C)   TempSrc:  Oral Oral   SpO2: 90% 90% 93% 92%  Weight:      Height:        Intake/Output from previous day:   Intake/Output Summary (Last 24 hours) at 10/05/2020 1447 Last data filed at 10/05/2020 0900 Gross per 24 hour  Intake 583.03 ml  Output 2600 ml  Net -2016.97 ml    Physical Exam: General: Alert, awake, oriented x3, in no acute distress.  HEENT: [place]/AT PEERL, EOMI Neck: Trachea midline,  no masses, no thyromegal,y no JVD, no carotid bruit OROPHARYNX:  Moist, No exudate/ erythema/lesions.  Heart: Regular rate and rhythm, without murmurs, rubs, gallops, PMI non-displaced, no heaves or thrills on palpation.  Lungs: Clear to auscultation, no wheezing or rhonchi noted. No increased vocal fremitus resonant to percussion  Abdomen: Soft, nontender, nondistended, positive bowel sounds, no masses no hepatosplenomegaly noted..  Neuro: No focal neurological deficits noted cranial nerves II through XII grossly intact. DTRs 2+ bilaterally upper and lower extremities. Strength 5 out of 5 in bilateral upper and lower extremities. Musculoskeletal: No warm swelling or erythema  around joints, no spinal tenderness noted. Psychiatric: Patient alert and oriented x3, good insight and cognition, good recent to remote recall. Lymph node survey: No cervical axillary or inguinal lymphadenopathy noted.  Lab Results:  Basic Metabolic Panel:    Component Value Date/Time   NA 137 10/05/2020 0727   NA 142 06/26/2018 1416   K 4.2 10/05/2020 0727   CL 103 10/05/2020 0727   CO2 26 10/05/2020 0727   BUN 18 10/05/2020 0727   BUN 7 06/26/2018 1416   CREATININE 0.52 (L) 10/05/2020 0727   GLUCOSE 106 (H) 10/05/2020 0727   CALCIUM 8.6 (L) 10/05/2020 0727   CBC:    Component Value Date/Time   WBC 15.9 (H) 10/05/2020 0727   HGB 8.2 (L) 10/05/2020 0727   HGB 9.6 (L) 06/26/2018 1416   HCT 22.7 (L) 10/05/2020 0727   HCT 28.5 (L) 06/26/2018 1416   PLT 282 10/05/2020 0727   PLT 405 06/26/2018 1416   MCV 83.5 10/05/2020 0727   MCV 92 06/26/2018 1416   NEUTROABS 16.4 (H) 10/02/2020 0519   NEUTROABS 3.7 06/26/2018 1416   LYMPHSABS 1.8 10/02/2020 0519   LYMPHSABS 3.3 (H) 06/26/2018 1416   MONOABS 2.6 (H) 10/02/2020 0519   EOSABS 0.1 10/02/2020 0519   EOSABS 0.2 06/26/2018 1416   BASOSABS 0.1 10/02/2020 0519   BASOSABS 0.1 06/26/2018 1416    Recent Results (from the past 240 hour(s))  Culture, blood (routine x 2)     Status: None (Preliminary result)   Collection Time: 10/01/20  8:45 PM   Specimen: BLOOD  Result Value Ref Range Status   Specimen Description   Final    BLOOD LEFT ANTECUBITAL Performed at [facility], [address] [place], [place] [address]    Special Requests   Final    BOTTLES DRAWN AEROBIC AND ANAEROBIC Blood Culture adequate volume Performed at [facility], [address], [place], [place] [address]    Culture   Final    NO GROWTH 4 DAYS Performed at [facility], [address], [place], [place] [address]    Report Status PENDING  Incomplete  Culture, blood (routine x 2)     Status: None  (Preliminary result)   Collection Time: 10/01/20  8:45 PM   Specimen: BLOOD  Result Value Ref Range Status   Specimen Description   Final    BLOOD LEFT WRIST Performed at [facility], [address] [place], [place] [address]    Special Requests   Final    BOTTLES DRAWN AEROBIC AND ANAEROBIC Blood Culture adequate volume Performed at [facility], [address] [place], [place] [address]    Culture   Final    NO GROWTH 4 DAYS Performed at [facility], [address], [place], [place] [address]    Report Status PENDING  Incomplete  MRSA PCR Screening     Status: None   Collection Time: 10/02/20 12:32 AM   Specimen: Nasopharyngeal  Result Value Ref Range Status   MRSA by PCR NEGATIVE NEGATIVE Final    Comment:        The GeneXpert MRSA Assay (FDA approved for NASAL specimens only), is one component of a comprehensive MRSA colonization surveillance program. It is not intended to diagnose MRSA infection nor to guide or monitor treatment for MRSA infections. Performed at [facility], [address], [place], [place] [address]     Studies/Results: No results found.  Medications: Scheduled Meds: . Chlorhexidine Gluconate Cloth  6 each Topical Daily  . HYDROmorphone   Intravenous Q4H  . ketorolac  15 mg Intravenous Q6H  . losartan  50 mg Oral Daily  . mouth rinse  15 mL Mouth Rinse BID  . methylPREDNISolone sodium succinate  60 mg Intravenous Q24H   Continuous Infusions: . sodium chloride 100 mL/hr at 10/03/20 2047  . ceFEPime (MAXIPIME) IV 2 g (10/05/20 1425)  . diphenhydrAMINE     PRN Meds:.acetaminophen **OR** acetaminophen, diphenhydrAMINE **OR** diphenhydrAMINE, ipratropium-albuterol, labetalol, lip balm, naloxone **AND** sodium chloride flush, ondansetron, oxyCODONE, sodium chloride flush  Consultants:  None  Procedures:  None  Antibiotics:  None  Assessment/Plan: Principal  Problem:   Acute chest syndrome due to sickle cell crisis (HCC) Active Problems:   Sepsis (HCC)   Sickle cell pain crisis (HCC)   Leukocytosis   Acute chest syndrome (HCC)   Hypertension   SOB (shortness of breath)   Respiratory failure with hypoxia (HCC)   HCAP (healthcare-associated pneumonia)  Sickle cell pain crisis with acute chest syndrome: Patient's oxygen requirements continue to improve.  Patient has been decreased to 2 L supplemental oxygen. Patient is s/p exchange transfusion on 10/02/2020.  Hemoglobin today is 8.2, which is consistent with his baseline. Discontinue IV azithromycin, continue IV cefepime Wean IV Dilaudid PCA Continue oxycodone 10 mg every 4 hours as needed for severe breakthrough pain Monitor vital signs closely, reevaluate pain scale regularly, and supplemental oxygen as needed.  Probable sepsis due to HCAP: Oxygen requirements  have improved.  Oxygen saturation 97% on 2 L.  Ambulating pulse oximetry today.  Continue IV antibiotics.  Continue Solu-Medrol 60 mg daily. Incentive spirometry encouraged  Leukocytosis: WBCs improving.  Patient continues to be afebrile.  Continue IV antibiotics.  Blood cultures negative.  Follow CBC in a.m.  Sickle cell anemia: Hemoglobin 8.2, consistent with patient's baseline.  No further transfusions warranted at this time.  Continue to follow closely.  Hypertension: Continue losartan 50 mg daily. Labetalol 10 mg every 2 hours as needed for systolic BP greater than 170 and/or diastolic BP greater than 100.  Respiratory failure with hypoxia: Continue close monitoring.  Decrease supplemental oxygen as tolerated.  Ambulating pulse oximetry today.   Code Status: Full Code Family Communication: N/A Disposition Plan: Not yet ready for discharge  Itxel Wickard Rennis Petty  APRN, MSN, FNP-C [facility] [address] [place], [place] [address] [phone]  If 5PM-8AM, please contact  night-coverage.  10/05/2020, 2:47 PM  LOS: 4 days

## 2020-10-05 NOTE — Progress Notes (Signed)
SATURATION QUALIFICATIONS: (This note is used to comply with regulatory documentation for home oxygen)  Patient Saturations on Room Air at Rest = 88%  Patient Saturations on Room Air while Ambulating = 87%  Patient Saturations on 2 Liters of oxygen while Ambulating = 92%   

## 2020-10-06 ENCOUNTER — Telehealth: Payer: Self-pay | Admitting: Family Medicine

## 2020-10-06 ENCOUNTER — Inpatient Hospital Stay (HOSPITAL_COMMUNITY): Payer: Medicaid Other

## 2020-10-06 ENCOUNTER — Encounter (HOSPITAL_COMMUNITY): Payer: Self-pay | Admitting: Internal Medicine

## 2020-10-06 LAB — BPAM RBC
Blood Product Expiration Date: 202112162359
Blood Product Expiration Date: 202112172359
ISSUE DATE / TIME: 202111081539
Unit Type and Rh: 5100
Unit Type and Rh: 5100

## 2020-10-06 LAB — CULTURE, BLOOD (ROUTINE X 2)
Culture: NO GROWTH
Culture: NO GROWTH
Special Requests: ADEQUATE
Special Requests: ADEQUATE

## 2020-10-06 LAB — TYPE AND SCREEN
ABO/RH(D): O POS
Antibody Screen: NEGATIVE
Unit division: 0
Unit division: 0

## 2020-10-06 MED ORDER — ALPRAZOLAM 0.5 MG PO TABS
0.5000 mg | ORAL_TABLET | Freq: Once | ORAL | Status: AC
  Administered 2020-10-06: 0.5 mg via ORAL
  Filled 2020-10-06: qty 1

## 2020-10-06 MED ORDER — ACETAMINOPHEN 500 MG PO TABS
1000.0000 mg | ORAL_TABLET | Freq: Once | ORAL | Status: AC
  Administered 2020-10-06: 1000 mg via ORAL
  Filled 2020-10-06: qty 2

## 2020-10-06 NOTE — Telephone Encounter (Signed)
Patient is currently hospitalized and has questions about sickle cell disease. He has requested a call back from his provider. [phone]

## 2020-10-06 NOTE — Progress Notes (Signed)
Subjective: [name] Hill is a 19 year old male with a medical history significant for sickle cell disease type SS, chronic pain syndrome, opiate dependence and tolerance, and history of anemia of chronic disease was admitted for hospital associated pneumonia in the setting of sickle cell pain crisis.  Patient is complaining of a frontal headache that has been present for around 12 hours.  Headache persists despite pain medication.  He feels that he is having "a sickle cell crisis in his head".  Pain intensity is 10/10.  Patient endorses some dizziness when transitioning from sitting to standing. He denies chest pain, paresthesias, blurred vision, recent falls.  No urinary symptoms, nausea, vomiting, or diarrhea.  Objective:  Vital signs in last 24 hours:  Vitals:   10/06/20 0832 10/06/20 0958 10/06/20 1421 10/06/20 1630  BP: 126/82 119/69    Pulse: 72 74    Resp: 20 20 20 20   Temp: 97.7 F (36.5 C) 98.1 F (36.7 C)    TempSrc: Oral Oral    SpO2: 92% 93% 94% 94%  Weight:      Height:        Intake/Output from previous day:   Intake/Output Summary (Last 24 hours) at 10/06/2020 1713 Last data filed at 10/06/2020 0800 Gross per 24 hour  Intake -  Output 2750 ml  Net -2750 ml    Physical Exam: General: Alert, awake, oriented x3, in no acute distress.  HEENT: [place]/AT PEERL, EOMI Neck: Trachea midline,  no masses, no thyromegal,y no JVD, no carotid bruit OROPHARYNX:  Moist, No exudate/ erythema/lesions.  Heart: Regular rate and rhythm, without murmurs, rubs, gallops, PMI non-displaced, no heaves or thrills on palpation.  Lungs: Clear to auscultation, no wheezing or rhonchi noted. No increased vocal fremitus resonant to percussion  Abdomen: Soft, nontender, nondistended, positive bowel sounds, no masses no hepatosplenomegaly noted..  Neuro: No focal neurological deficits noted cranial nerves II through XII grossly intact. DTRs 2+ bilaterally upper and lower extremities. Strength 5  out of 5 in bilateral upper and lower extremities. Musculoskeletal: No warm swelling or erythema around joints, no spinal tenderness noted. Psychiatric: Patient alert and oriented x3, good insight and cognition, good recent to remote recall. Lymph node survey: No cervical axillary or inguinal lymphadenopathy noted.  Lab Results:  Basic Metabolic Panel:    Component Value Date/Time   NA 137 10/05/2020 0727   NA 142 06/26/2018 1416   K 4.2 10/05/2020 0727   CL 103 10/05/2020 0727   CO2 26 10/05/2020 0727   BUN 18 10/05/2020 0727   BUN 7 06/26/2018 1416   CREATININE 0.52 (L) 10/05/2020 0727   GLUCOSE 106 (H) 10/05/2020 0727   CALCIUM 8.6 (L) 10/05/2020 0727   CBC:    Component Value Date/Time   WBC 15.9 (H) 10/05/2020 0727   HGB 8.2 (L) 10/05/2020 0727   HGB 9.6 (L) 06/26/2018 1416   HCT 22.7 (L) 10/05/2020 0727   HCT 28.5 (L) 06/26/2018 1416   PLT 282 10/05/2020 0727   PLT 405 06/26/2018 1416   MCV 83.5 10/05/2020 0727   MCV 92 06/26/2018 1416   NEUTROABS 16.4 (H) 10/02/2020 0519   NEUTROABS 3.7 06/26/2018 1416   LYMPHSABS 1.8 10/02/2020 0519   LYMPHSABS 3.3 (H) 06/26/2018 1416   MONOABS 2.6 (H) 10/02/2020 0519   EOSABS 0.1 10/02/2020 0519   EOSABS 0.2 06/26/2018 1416   BASOSABS 0.1 10/02/2020 0519   BASOSABS 0.1 06/26/2018 1416    Recent Results (from the past 240 hour(s))  Culture, blood (routine x  2)     Status: None   Collection Time: 10/01/20  8:45 PM   Specimen: BLOOD  Result Value Ref Range Status   Specimen Description   Final    BLOOD LEFT ANTECUBITAL Performed at [facility] [address], [place], [place] [address]    Special Requests   Final    BOTTLES DRAWN AEROBIC AND ANAEROBIC Blood Culture adequate volume Performed at [facility], [address] [place], [place] [address]    Culture   Final    NO GROWTH 5 DAYS Performed at [facility], [address], [place], [place] [address]    Report  Status 10/06/2020 FINAL  Final  Culture, blood (routine x 2)     Status: None   Collection Time: 10/01/20  8:45 PM   Specimen: BLOOD  Result Value Ref Range Status   Specimen Description   Final    BLOOD LEFT WRIST Performed at [facility], [address], [place], [place] [address]    Special Requests   Final    BOTTLES DRAWN AEROBIC AND ANAEROBIC Blood Culture adequate volume Performed at [facility], [address] [place], [place] [address]    Culture   Final    NO GROWTH 5 DAYS Performed at [facility], [address], [place], [place] [address]    Report Status 10/06/2020 FINAL  Final  MRSA PCR Screening     Status: None   Collection Time: 10/02/20 12:32 AM   Specimen: Nasopharyngeal  Result Value Ref Range Status   MRSA by PCR NEGATIVE NEGATIVE Final    Comment:        The GeneXpert MRSA Assay (FDA approved for NASAL specimens only), is one component of a comprehensive MRSA colonization surveillance program. It is not intended to diagnose MRSA infection nor to guide or monitor treatment for MRSA infections. Performed at [facility], [address], [place], [place] [address]     Studies/Results: CT HEAD WO CONTRAST  Result Date: 10/06/2020 CLINICAL DATA:  19 year old male with headache since this morning, light sensitivity. Upper extremity weakness. EXAM: CT HEAD WITHOUT CONTRAST TECHNIQUE: Contiguous axial images were obtained from the base of the skull through the vertex without intravenous contrast. COMPARISON:  Face CT 04/03/2015. Brain MRI 10/30/2015. FINDINGS: Brain: Cerebral volume is stable. No midline shift, ventriculomegaly, mass effect, evidence of mass lesion, intracranial hemorrhage or evidence of cortically based acute infarction. Gray-white matter differentiation is within normal limits throughout the brain. Vascular: No suspicious intracranial vascular hyperdensity. Skull: Mild chronic  right lamina papyracea fracture. No acute osseous abnormality identified. Congenital incomplete ossification of the posterior C1 ring. Sinuses/Orbits: Visualized paranasal sinuses and mastoids are stable and well pneumatized. Other: No acute orbit or scalp soft tissue finding. IMPRESSION: 1. Stable and negative noncontrast CT appearance of the brain. 2. Mild chronic right lamina papyracea fracture. Electronically Signed   By: Odessa Fleming M.D.   On: 10/06/2020 15:02    Medications: Scheduled Meds: . ALPRAZolam  0.5 mg Oral Once  . Chlorhexidine Gluconate Cloth  6 each Topical Daily  . HYDROmorphone   Intravenous Q4H  . ketorolac  15 mg Intravenous Q6H  . losartan  50 mg Oral Daily  . mouth rinse  15 mL Mouth Rinse BID   Continuous Infusions: . sodium chloride 100 mL/hr at 10/03/20 2047  . ceFEPime (MAXIPIME) IV 2 g (10/06/20 1411)  . diphenhydrAMINE     PRN  Meds:.acetaminophen **OR** acetaminophen, diphenhydrAMINE **OR** diphenhydrAMINE, ipratropium-albuterol, labetalol, lip balm, naloxone **AND** sodium chloride flush, ondansetron, oxyCODONE, sodium chloride flush  Consultants:  None  Procedures:  Head CT  Antibiotics:  None  Assessment/Plan: Principal Problem:   Acute chest syndrome due to sickle cell crisis (HCC) Active Problems:   Sepsis (HCC)   Sickle cell pain crisis (HCC)   Leukocytosis   Acute chest syndrome (HCC)   Hypertension   SOB (shortness of breath)   Respiratory failure with hypoxia (HCC)   HCAP (healthcare-associated pneumonia)  Intractable headache: Patient complaining of persistent headache that is unrelieved by prescribed pain medications.  Patient endorses some dizziness.  Otherwise, neuro exam unremarkable. Will review CT of head as results become available. Tylenol 1000 mg every 6 hours as needed for headache  Sickle cell pain crisis with acute chest syndrome: Improving.  Patient's oxygen saturation is 97% on 2 L.  Patient is s/p exchange  transfusion on 10/02/2020. Today hemoglobin is 8.2, consistent with patient's baseline. IV cefepime will be continued, stop date scheduled for 10/08/2020. Continue to wean IV Dilaudid PCA Continue oxycodone 10 mg every 4 hours as needed for severe breakthrough pain. Monitor vital signs closely, reevaluate pain scale regularly, and supplemental oxygen as needed.  Probable sepsis due to HCAP: Symptoms improving.  Oxygen requirement decreased.  Oxygen saturation above 90% on 2 L.  Repeat ambulating pulse oximetry. Continue IV antibiotics.  Discontinue Solu-Medrol. Incentive spirometry.  Leukocytosis: WBCs improving.  Patient afebrile.  Blood cultures negative.  Follow CBC in a.m.  Sickle cell anemia: Hemoglobin stable and consistent with patient's baseline.  No further blood transfusions warranted at this time.  Continue to monitor closely.  Hypertension: Continue losartan 50 mg daily.  Labetalol 10 mg every 2 hours as needed for systolic BP greater than 170 and/or diastolic BP greater than 100.  Respiratory failure with hypoxia: Continue close monitoring.  Titrate supplemental oxygen as tolerated.   Code Status: Full Code Family Communication: N/A Disposition Plan: Not yet ready for discharge  Sriman Tally Rennis Petty  APRN, MSN, FNP-C [facility] [address] [place], [place] [address] [phone]  If 5PM-7AM, please contact night-coverage.  10/06/2020, 5:13 PM  LOS: 5 days

## 2020-10-07 DIAGNOSIS — I158 Other secondary hypertension: Secondary | ICD-10-CM

## 2020-10-07 DIAGNOSIS — D5701 Hb-SS disease with acute chest syndrome: Secondary | ICD-10-CM | POA: Diagnosis not present

## 2020-10-07 DIAGNOSIS — R0602 Shortness of breath: Secondary | ICD-10-CM

## 2020-10-07 DIAGNOSIS — J189 Pneumonia, unspecified organism: Secondary | ICD-10-CM | POA: Diagnosis not present

## 2020-10-07 DIAGNOSIS — J9601 Acute respiratory failure with hypoxia: Secondary | ICD-10-CM | POA: Diagnosis not present

## 2020-10-07 DIAGNOSIS — A419 Sepsis, unspecified organism: Principal | ICD-10-CM

## 2020-10-07 DIAGNOSIS — D5700 Hb-SS disease with crisis, unspecified: Secondary | ICD-10-CM | POA: Diagnosis not present

## 2020-10-07 LAB — CBC
HCT: 22.5 % — ABNORMAL LOW (ref 39.0–52.0)
Hemoglobin: 7.7 g/dL — ABNORMAL LOW (ref 13.0–17.0)
MCH: 29.4 pg (ref 26.0–34.0)
MCHC: 34.2 g/dL (ref 30.0–36.0)
MCV: 85.9 fL (ref 80.0–100.0)
Platelets: 328 10*3/uL (ref 150–400)
RBC: 2.62 MIL/uL — ABNORMAL LOW (ref 4.22–5.81)
RDW: 18.7 % — ABNORMAL HIGH (ref 11.5–15.5)
WBC: 22.3 10*3/uL — ABNORMAL HIGH (ref 4.0–10.5)
nRBC: 11.6 % — ABNORMAL HIGH (ref 0.0–0.2)

## 2020-10-07 MED ORDER — ONDANSETRON HCL 4 MG/2ML IJ SOLN
4.0000 mg | Freq: Four times a day (QID) | INTRAMUSCULAR | Status: DC | PRN
  Administered 2020-10-10: 4 mg via INTRAVENOUS
  Filled 2020-10-07: qty 2

## 2020-10-07 MED ORDER — IBUPROFEN 800 MG PO TABS
800.0000 mg | ORAL_TABLET | Freq: Four times a day (QID) | ORAL | Status: DC | PRN
  Administered 2020-10-07 – 2020-10-09 (×4): 800 mg via ORAL
  Filled 2020-10-07 (×4): qty 1

## 2020-10-07 MED ORDER — DIPHENHYDRAMINE HCL 25 MG PO CAPS: 25.0000 mg | ORAL_CAPSULE | ORAL | Status: DC | PRN

## 2020-10-07 MED ORDER — MORPHINE SULFATE 2 MG/ML IV SOLN
INTRAVENOUS | Status: DC
  Administered 2020-10-07: 10 mg via INTRAVENOUS
  Administered 2020-10-07: 19.43 mg via INTRAVENOUS
  Administered 2020-10-07: 18.5 mg via INTRAVENOUS
  Administered 2020-10-08: 2 mg via INTRAVENOUS
  Administered 2020-10-08: 32.49 mg via INTRAVENOUS
  Administered 2020-10-08: 27.5 mg via INTRAVENOUS
  Administered 2020-10-08: 2.5 mg via INTRAVENOUS
  Administered 2020-10-08: 14.36 mg via INTRAVENOUS
  Administered 2020-10-08 – 2020-10-09 (×2): 2 mg via INTRAVENOUS
  Administered 2020-10-09: 25 mg via INTRAVENOUS
  Administered 2020-10-09: 2 mg via INTRAVENOUS
  Administered 2020-10-09: 17.4 mg via INTRAVENOUS
  Filled 2020-10-07 (×5): qty 25

## 2020-10-07 MED ORDER — SODIUM CHLORIDE 0.9 % IV SOLN
25.0000 mg | INTRAVENOUS | Status: DC | PRN
  Filled 2020-10-07: qty 0.5

## 2020-10-07 MED ORDER — CYCLOBENZAPRINE HCL 10 MG PO TABS
10.0000 mg | ORAL_TABLET | Freq: Three times a day (TID) | ORAL | Status: DC
  Administered 2020-10-07 (×2): 10 mg via ORAL
  Filled 2020-10-07 (×2): qty 1

## 2020-10-07 MED ORDER — DEXTROSE-NACL 5-0.45 % IV SOLN: INTRAVENOUS | Status: DC

## 2020-10-07 MED ORDER — SODIUM CHLORIDE 0.9% FLUSH: 9.0000 mL | INTRAVENOUS | Status: DC | PRN

## 2020-10-07 MED ORDER — NALOXONE HCL 0.4 MG/ML IJ SOLN: 0.4000 mg | INTRAMUSCULAR | Status: DC | PRN

## 2020-10-07 MED ORDER — SUMATRIPTAN SUCCINATE 6 MG/0.5ML ~~LOC~~ SOLN
6.0000 mg | SUBCUTANEOUS | Status: AC | PRN
  Administered 2020-10-07 (×2): 6 mg via SUBCUTANEOUS
  Filled 2020-10-07 (×4): qty 0.5

## 2020-10-07 NOTE — Progress Notes (Signed)
Patient ID: [name] Hill, male   DOB: [birth year], 19 y.o.   MRN: [id number] Subjective: [name] Hill is a 19 year old male with a medical history significant for sickle cell disease type SS, chronic pain syndrome, opiate dependence and tolerance, and history of anemia of chronic disease was admitted for hospital associated pneumonia in the setting of sickle cell pain crisis.  Patient claims his pain is not controlled, still having significant frontal headache, he thinks it is Dilaudid PCA, he said he normally gets headache with Dilaudid, he prefers morphine. CT head was negative for any acute disease.  He denies any chest pain, nausea, vomiting or diarrhea. No urinary symptoms. No blurry vision. No numbness or tingling.  Objective:  Vital signs in last 24 hours:  Vitals:   10/07/20 0420 10/07/20 0502 10/07/20 0836 10/07/20 1014  BP:  135/78  (!) 141/85  Pulse:  (!) 51  60  Resp: 18 20 18 14   Temp:  99.1 F (37.3 C)  98.3 F (36.8 C)  TempSrc:  Oral  Oral  SpO2: 100% 97% 97% 96%  Weight:  88.3 kg    Height:       Intake/Output from previous day:   Intake/Output Summary (Last 24 hours) at 10/07/2020 1311 Last data filed at 10/07/2020 0900 Gross per 24 hour  Intake --  Output 2950 ml  Net -2950 ml   Physical Exam: General: Alert, awake, oriented x3, in no acute distress.  HEENT: [place]/AT PEERL, EOMI Neck: Trachea midline,  no masses, no thyromegal,y no JVD, no carotid bruit OROPHARYNX:  Moist, No exudate/ erythema/lesions.  Heart: Regular rate and rhythm, without murmurs, rubs, gallops, PMI non-displaced, no heaves or thrills on palpation.  Lungs: Clear to auscultation, no wheezing or rhonchi noted. No increased vocal fremitus resonant to percussion  Abdomen: Soft, nontender, nondistended, positive bowel sounds, no masses no hepatosplenomegaly noted..  Neuro: No focal neurological deficits noted cranial nerves II through XII grossly intact. DTRs 2+ bilaterally upper and lower  extremities. Strength 5 out of 5 in bilateral upper and lower extremities. Musculoskeletal: No warm swelling or erythema around joints, no spinal tenderness noted. Psychiatric: Patient alert and oriented x3, good insight and cognition, good recent to remote recall. Lymph node survey: No cervical axillary or inguinal lymphadenopathy noted.  Lab Results:  Basic Metabolic Panel:    Component Value Date/Time   NA 137 10/05/2020 0727   NA 142 06/26/2018 1416   K 4.2 10/05/2020 0727   CL 103 10/05/2020 0727   CO2 26 10/05/2020 0727   BUN 18 10/05/2020 0727   BUN 7 06/26/2018 1416   CREATININE 0.52 (L) 10/05/2020 0727   GLUCOSE 106 (H) 10/05/2020 0727   CALCIUM 8.6 (L) 10/05/2020 0727   CBC:    Component Value Date/Time   WBC 22.3 (H) 10/07/2020 0546   HGB 7.7 (L) 10/07/2020 0546   HGB 9.6 (L) 06/26/2018 1416   HCT 22.5 (L) 10/07/2020 0546   HCT 28.5 (L) 06/26/2018 1416   PLT 328 10/07/2020 0546   PLT 405 06/26/2018 1416   MCV 85.9 10/07/2020 0546   MCV 92 06/26/2018 1416   NEUTROABS 16.4 (H) 10/02/2020 0519   NEUTROABS 3.7 06/26/2018 1416   LYMPHSABS 1.8 10/02/2020 0519   LYMPHSABS 3.3 (H) 06/26/2018 1416   MONOABS 2.6 (H) 10/02/2020 0519   EOSABS 0.1 10/02/2020 0519   EOSABS 0.2 06/26/2018 1416   BASOSABS 0.1 10/02/2020 0519   BASOSABS 0.1 06/26/2018 1416    Recent Results (from the past 240  hour(s))  Culture, blood (routine x 2)     Status: None   Collection Time: 10/01/20  8:45 PM   Specimen: BLOOD  Result Value Ref Range Status   Specimen Description   Final    BLOOD LEFT ANTECUBITAL Performed at [facility], [address], [place], [place] [address]    Special Requests   Final    BOTTLES DRAWN AEROBIC AND ANAEROBIC Blood Culture adequate volume Performed at [facility], [address], [place], [place] [address]    Culture   Final    NO GROWTH 5 DAYS Performed at [facility], [address], [place], [place]  [address]    Report Status 10/06/2020 FINAL  Final  Culture, blood (routine x 2)     Status: None   Collection Time: 10/01/20  8:45 PM   Specimen: BLOOD  Result Value Ref Range Status   Specimen Description   Final    BLOOD LEFT WRIST Performed at [facility], [address] [place], [place] [address]    Special Requests   Final    BOTTLES DRAWN AEROBIC AND ANAEROBIC Blood Culture adequate volume Performed at [facility], [address] [place], [place] [address]    Culture   Final    NO GROWTH 5 DAYS Performed at [facility], [address] [place], [place] [address]    Report Status 10/06/2020 FINAL  Final  MRSA PCR Screening     Status: None   Collection Time: 10/02/20 12:32 AM   Specimen: Nasopharyngeal  Result Value Ref Range Status   MRSA by PCR NEGATIVE NEGATIVE Final    Comment:        The GeneXpert MRSA Assay (FDA approved for NASAL specimens only), is one component of a comprehensive MRSA colonization surveillance program. It is not intended to diagnose MRSA infection nor to guide or monitor treatment for MRSA infections. Performed at [facility], [address] [place], [place] [address]     Studies/Results: CT HEAD WO CONTRAST  Result Date: 10/06/2020 CLINICAL DATA:  19 year old male with headache since this morning, light sensitivity. Upper extremity weakness. EXAM: CT HEAD WITHOUT CONTRAST TECHNIQUE: Contiguous axial images were obtained from the base of the skull through the vertex without intravenous contrast. COMPARISON:  Face CT 04/03/2015. Brain MRI 10/30/2015. FINDINGS: Brain: Cerebral volume is stable. No midline shift, ventriculomegaly, mass effect, evidence of mass lesion, intracranial hemorrhage or evidence of cortically based acute infarction. Gray-white matter differentiation is within normal limits throughout the brain. Vascular: No suspicious intracranial vascular hyperdensity.  Skull: Mild chronic right lamina papyracea fracture. No acute osseous abnormality identified. Congenital incomplete ossification of the posterior C1 ring. Sinuses/Orbits: Visualized paranasal sinuses and mastoids are stable and well pneumatized. Other: No acute orbit or scalp soft tissue finding. IMPRESSION: 1. Stable and negative noncontrast CT appearance of the brain. 2. Mild chronic right lamina papyracea fracture. Electronically Signed   By: Odessa Fleming M.D.   On: 10/06/2020 15:02    Medications: Scheduled Meds: . Chlorhexidine Gluconate Cloth  6 each Topical Daily  . losartan  50 mg Oral Daily  . mouth rinse  15 mL Mouth Rinse BID  . morphine   Intravenous Q4H   Continuous Infusions: . ceFEPime (MAXIPIME) IV 2 g (10/07/20 0626)  . dextrose 5 % and 0.45% NaCl    . diphenhydrAMINE     PRN Meds:.acetaminophen **OR** acetaminophen, diphenhydrAMINE **OR** diphenhydrAMINE, ibuprofen, ipratropium-albuterol, labetalol,  lip balm, naloxone **AND** sodium chloride flush, ondansetron (ZOFRAN) IV, oxyCODONE, sodium chloride flush  Consultants:  None  Procedures:  None  Antibiotics:  IV Cefepime  Assessment/Plan: Principal Problem:   Acute chest syndrome due to sickle cell crisis (HCC) Active Problems:   Sepsis (HCC)   Sickle cell pain crisis (HCC)   Leukocytosis   Acute chest syndrome (HCC)   Hypertension   SOB (shortness of breath)   Respiratory failure with hypoxia (HCC)   HCAP (healthcare-associated pneumonia)  1. Acute Chest Syndrome: Improving. Oxygen saturation consistently above 95% on room air. Patient is status post exchange transfusion. Hemoglobin is now stable at baseline. We will continue IV cefepime. Will restart IV fluid half-normal saline at 100 cc an hour in view of poor oral intake. Discontinue weight based Dilaudid PCA and replaced with morphine via PCA. Add ibuprofen 800 mg tablet p.o. q. 8 hourly. Patient has completed the course of IV Toradol, Monitor vitals very  closely, Re-evaluate pain scale regularly, 2 L of Oxygen by [place]. 2. Probable sepsis due to HCAP: No more fever. Symptoms improving overall. Continue IV antibiotics. 3. Sickle Cell Anemia: Patient is status post exchange transfusion. Hemoglobin is now stable at baseline. No clinical indication for blood transfusion today. 4. Chronic pain Syndrome: Continue home pain medications. 5. Essential hypertension: Continue losartan 50 mg p.o. daily. Labetalol 10 mg as needed for elevated blood pressure with parameters as ordered. 6. Intractable headache: CT head is negative for acute process. Most likely from Dilaudid. Discontinue Dilaudid PCA and replaced with morphine. Add Ibuprofen and Tylenol as needed for headache.  Code Status: Full Code Family Communication: N/A Disposition Plan: Not yet ready for discharge  [name] Hill  If 7PM-7AM, please contact night-coverage.  10/07/2020, 1:11 PM  LOS: 6 days

## 2020-10-08 DIAGNOSIS — J9601 Acute respiratory failure with hypoxia: Secondary | ICD-10-CM | POA: Diagnosis not present

## 2020-10-08 DIAGNOSIS — D5700 Hb-SS disease with crisis, unspecified: Secondary | ICD-10-CM | POA: Diagnosis not present

## 2020-10-08 DIAGNOSIS — I158 Other secondary hypertension: Secondary | ICD-10-CM | POA: Diagnosis not present

## 2020-10-08 DIAGNOSIS — R0602 Shortness of breath: Secondary | ICD-10-CM | POA: Diagnosis not present

## 2020-10-08 DIAGNOSIS — J189 Pneumonia, unspecified organism: Secondary | ICD-10-CM | POA: Diagnosis not present

## 2020-10-08 DIAGNOSIS — D5701 Hb-SS disease with acute chest syndrome: Secondary | ICD-10-CM | POA: Diagnosis not present

## 2020-10-08 DIAGNOSIS — A419 Sepsis, unspecified organism: Secondary | ICD-10-CM | POA: Diagnosis not present

## 2020-10-08 LAB — BASIC METABOLIC PANEL
Anion gap: 9 (ref 5–15)
BUN: 8 mg/dL (ref 6–20)
CO2: 27 mmol/L (ref 22–32)
Calcium: 8.7 mg/dL — ABNORMAL LOW (ref 8.9–10.3)
Chloride: 97 mmol/L — ABNORMAL LOW (ref 98–111)
Creatinine, Ser: 0.54 mg/dL — ABNORMAL LOW (ref 0.61–1.24)
GFR, Estimated: >60 mL/min (ref >60)
Glucose, Bld: 128 mg/dL — ABNORMAL HIGH (ref 70–99)
Potassium: 4.2 mmol/L (ref 3.5–5.1)
Sodium: 133 mmol/L — ABNORMAL LOW (ref 135–145)

## 2020-10-08 LAB — CBC WITH DIFFERENTIAL/PLATELET
Abs Immature Granulocytes: 0.19 10*3/uL — ABNORMAL HIGH (ref 0.00–0.07)
Basophils Absolute: 0 10*3/uL (ref 0.0–0.1)
Basophils Relative: 0 %
Eosinophils Absolute: 0 10*3/uL (ref 0.0–0.5)
Eosinophils Relative: 0 %
HCT: 24.1 % — ABNORMAL LOW (ref 39.0–52.0)
Hemoglobin: 8.3 g/dL — ABNORMAL LOW (ref 13.0–17.0)
Immature Granulocytes: 1 %
Lymphocytes Relative: 14 %
Lymphs Abs: 2.5 10*3/uL (ref 0.7–4.0)
MCH: 29.4 pg (ref 26.0–34.0)
MCHC: 34.4 g/dL (ref 30.0–36.0)
MCV: 85.5 fL (ref 80.0–100.0)
Monocytes Absolute: 2.2 10*3/uL — ABNORMAL HIGH (ref 0.1–1.0)
Monocytes Relative: 12 %
Neutro Abs: 13.4 10*3/uL — ABNORMAL HIGH (ref 1.7–7.7)
Neutrophils Relative %: 73 %
Platelets: 381 10*3/uL (ref 150–400)
RBC: 2.82 MIL/uL — ABNORMAL LOW (ref 4.22–5.81)
RDW: 19 % — ABNORMAL HIGH (ref 11.5–15.5)
WBC: 18.4 10*3/uL — ABNORMAL HIGH (ref 4.0–10.5)
nRBC: 8.6 % — ABNORMAL HIGH (ref 0.0–0.2)

## 2020-10-08 MED ORDER — TIZANIDINE HCL 4 MG PO TABS
4.0000 mg | ORAL_TABLET | Freq: Three times a day (TID) | ORAL | Status: DC
  Administered 2020-10-08 – 2020-10-10 (×5): 4 mg via ORAL
  Filled 2020-10-08 (×8): qty 1

## 2020-10-08 NOTE — Progress Notes (Signed)
Patient ID: [name] Hill, male   DOB: 08-Jan-2001, 19 y.o.   MRN: [id number] Subjective: Gideon Burstein is a 19 year old male with a medical history significant for sickle cell disease type SS, chronic pain syndrome, opiate dependence and tolerance, and history of anemia of chronic disease was admitted for hospital associated pneumonia in the setting of sickle cell pain crisis.  Today patient thinks he is headache is slightly better but he still frontal. He thinks ibuprofen is giving him more relief than any other medication that he is currently taking. He also complains of swollen right eyelid. No red eye, no significant pain associated with the swelling. He denies any fever, cough, chest pain, shortness of breath, dizziness, nausea, vomiting or diarrhea. No blurry vision.  Objective:  Vital signs in last 24 hours:  Vitals:   10/08/20 0613 10/08/20 0802 10/08/20 1005 10/08/20 1306  BP:   127/69   Pulse:   97   Resp: 15 20 14 20   Temp:   99 F (37.2 C)   TempSrc:   Oral   SpO2: 99% 96% 98% 98%  Weight:      Height:       Intake/Output from previous day:   Intake/Output Summary (Last 24 hours) at 10/08/2020 1342 Last data filed at 10/08/2020 0502 Gross per 24 hour  Intake 720 ml  Output 1850 ml  Net -1130 ml   Physical Exam: General: Alert, awake, oriented x3, in no acute distress.  HEENT: [place]/AT PEERL, EOMI. Slightly edematous right upper eyelid, no tenderness, feels like fluid retention. Neck: Trachea midline,  no masses, no thyromegal,y no JVD, no carotid bruit OROPHARYNX:  Moist, No exudate/ erythema/lesions.  Heart: Regular rate and rhythm, without murmurs, rubs, gallops, PMI non-displaced, no heaves or thrills on palpation.  Lungs: Clear to auscultation, no wheezing or rhonchi noted. No increased vocal fremitus resonant to percussion  Abdomen: Soft, nontender, nondistended, positive bowel sounds, no masses no hepatosplenomegaly noted..  Neuro: No focal neurological deficits  noted cranial nerves II through XII grossly intact. DTRs 2+ bilaterally upper and lower extremities. Strength 5 out of 5 in bilateral upper and lower extremities. Musculoskeletal: No warm swelling or erythema around joints, no spinal tenderness noted. Psychiatric: Patient alert and oriented x3, good insight and cognition, good recent to remote recall. Lymph node survey: No cervical axillary or inguinal lymphadenopathy noted.  Lab Results:  Basic Metabolic Panel:    Component Value Date/Time   NA 133 (L) 10/08/2020 0610   NA 142 06/26/2018 1416   K 4.2 10/08/2020 0610   CL 97 (L) 10/08/2020 0610   CO2 27 10/08/2020 0610   BUN 8 10/08/2020 0610   BUN 7 06/26/2018 1416   CREATININE 0.54 (L) 10/08/2020 0610   GLUCOSE 128 (H) 10/08/2020 0610   CALCIUM 8.7 (L) 10/08/2020 0610   CBC:    Component Value Date/Time   WBC 18.4 (H) 10/08/2020 0610   HGB 8.3 (L) 10/08/2020 0610   HGB 9.6 (L) 06/26/2018 1416   HCT 24.1 (L) 10/08/2020 0610   HCT 28.5 (L) 06/26/2018 1416   PLT 381 10/08/2020 0610   PLT 405 06/26/2018 1416   MCV 85.5 10/08/2020 0610   MCV 92 06/26/2018 1416   NEUTROABS 13.4 (H) 10/08/2020 0610   NEUTROABS 3.7 06/26/2018 1416   LYMPHSABS 2.5 10/08/2020 0610   LYMPHSABS 3.3 (H) 06/26/2018 1416   MONOABS 2.2 (H) 10/08/2020 0610   EOSABS 0.0 10/08/2020 0610   EOSABS 0.2 06/26/2018 1416   BASOSABS 0.0 10/08/2020  5366   BASOSABS 0.1 06/26/2018 1416    Recent Results (from the past 240 hour(s))  Culture, blood (routine x 2)     Status: None   Collection Time: 10/01/20  8:45 PM   Specimen: BLOOD  Result Value Ref Range Status   Specimen Description   Final    BLOOD LEFT ANTECUBITAL Performed at [facility], [address], [place], [place] [address]    Special Requests   Final    BOTTLES DRAWN AEROBIC AND ANAEROBIC Blood Culture adequate volume Performed at [facility], [address], [place], [place] [address]    Culture    Final    NO GROWTH 5 DAYS Performed at [facility], [address] [place], [place] [address]    Report Status 10/06/2020 FINAL  Final  Culture, blood (routine x 2)     Status: None   Collection Time: 10/01/20  8:45 PM   Specimen: BLOOD  Result Value Ref Range Status   Specimen Description   Final    BLOOD LEFT WRIST Performed at [facility], [address], [place], [place] [address]    Special Requests   Final    BOTTLES DRAWN AEROBIC AND ANAEROBIC Blood Culture adequate volume Performed at [facility], [address] [place], [place] [address]    Culture   Final    NO GROWTH 5 DAYS Performed at [facility], [address] [place], [place] [address]    Report Status 10/06/2020 FINAL  Final  MRSA PCR Screening     Status: None   Collection Time: 10/02/20 12:32 AM   Specimen: Nasopharyngeal  Result Value Ref Range Status   MRSA by PCR NEGATIVE NEGATIVE Final    Comment:        The GeneXpert MRSA Assay (FDA approved for NASAL specimens only), is one component of a comprehensive MRSA colonization surveillance program. It is not intended to diagnose MRSA infection nor to guide or monitor treatment for MRSA infections. Performed at [facility], [address], [place], [place] [address]     Studies/Results: CT HEAD WO CONTRAST  Result Date: 10/06/2020 CLINICAL DATA:  19 year old male with headache since this morning, light sensitivity. Upper extremity weakness. EXAM: CT HEAD WITHOUT CONTRAST TECHNIQUE: Contiguous axial images were obtained from the base of the skull through the vertex without intravenous contrast. COMPARISON:  Face CT 04/03/2015. Brain MRI 10/30/2015. FINDINGS: Brain: Cerebral volume is stable. No midline shift, ventriculomegaly, mass effect, evidence of mass lesion, intracranial hemorrhage or evidence of cortically based acute infarction. Gray-white matter differentiation is within  normal limits throughout the brain. Vascular: No suspicious intracranial vascular hyperdensity. Skull: Mild chronic right lamina papyracea fracture. No acute osseous abnormality identified. Congenital incomplete ossification of the posterior C1 ring. Sinuses/Orbits: Visualized paranasal sinuses and mastoids are stable and well pneumatized. Other: No acute orbit or scalp soft tissue finding. IMPRESSION: 1. Stable and negative noncontrast CT appearance of the brain. 2. Mild chronic right lamina papyracea fracture. Electronically Signed   By: Odessa Fleming M.D.   On: 10/06/2020 15:02    Medications: Scheduled Meds: . Chlorhexidine Gluconate Cloth  6 each Topical Daily  . losartan  50 mg Oral Daily  . mouth rinse  15 mL Mouth Rinse BID  . morphine   Intravenous Q4H  . tiZANidine  4 mg Oral TID   Continuous Infusions: . ceFEPime (MAXIPIME) IV 2 g (10/08/20 1340)  . dextrose  5 % and 0.45% NaCl 50 mL/hr at 10/08/20 1306  . diphenhydrAMINE     PRN Meds:.acetaminophen **OR** acetaminophen, diphenhydrAMINE **OR** diphenhydrAMINE, ibuprofen, ipratropium-albuterol, labetalol, lip balm, naloxone **AND** sodium chloride flush, ondansetron (ZOFRAN) IV, oxyCODONE, sodium chloride flush  Consultants:  None  Procedures:  None  Antibiotics:  IV Cefepime  Assessment/Plan: Principal Problem:   Acute chest syndrome due to sickle cell crisis (HCC) Active Problems:   Sepsis (HCC)   Sickle cell pain crisis (HCC)   Leukocytosis   Acute chest syndrome (HCC)   Hypertension   SOB (shortness of breath)   Respiratory failure with hypoxia (HCC)   HCAP (healthcare-associated pneumonia)  1. Acute Chest Syndrome: Improving. Oxygen saturation consistently above 95% on room air. Patient is status post exchange transfusion. Hemoglobin is now stable at baseline. We will continue IV cefepime, last dose expected today. Will reduce IV fluid to 50 cc/hour. Continue morphine via PCA at current setting. Continue ibuprofen  800 mg tablet p.o. q. 8 hourly as needed for headache. Monitor vitals very closely, Re-evaluate pain scale regularly, 2 L of Oxygen by [place]. 2. Probable sepsis due to HCAP: No fever. Symptoms improving overall. Continue IV antibiotics. 3. Sickle Cell Anemia: Patient is status post exchange transfusion. Hemoglobin is stable at baseline today. No clinical indication for blood transfusion today. Continue to monitor closely. Repeat labs in a.m. 4. Chronic pain Syndrome: Continue home oral pain medications. Add tizanidine 4 mg tablet p.o. q. 8 hourly as needed. 5. Essential hypertension: Continue losartan 50 mg p.o. daily. Labetalol 10 mg as needed for elevated blood pressure with parameters as ordered. 6. Intractable headache: CT head is negative for acute process. Most likely from Dilaudid. Slightly better after Dilaudid was discontinued. Continue with morphine. Ibuprofen and Tylenol as needed for headache.  Code Status: Full Code Family Communication: N/A Disposition Plan: Not yet ready for discharge  Jamia Hoban  If 7PM-7AM, please contact night-coverage.  10/08/2020, 1:42 PM  LOS: 7 days

## 2020-10-09 LAB — CBC WITH DIFFERENTIAL/PLATELET
Abs Immature Granulocytes: 0.18 10*3/uL — ABNORMAL HIGH (ref 0.00–0.07)
Basophils Absolute: 0 10*3/uL (ref 0.0–0.1)
Basophils Relative: 0 %
Eosinophils Absolute: 0.2 10*3/uL (ref 0.0–0.5)
Eosinophils Relative: 1 %
HCT: 22.4 % — ABNORMAL LOW (ref 39.0–52.0)
Hemoglobin: 7.6 g/dL — ABNORMAL LOW (ref 13.0–17.0)
Immature Granulocytes: 1 %
Lymphocytes Relative: 23 %
Lymphs Abs: 3.8 10*3/uL (ref 0.7–4.0)
MCH: 29.1 pg (ref 26.0–34.0)
MCHC: 33.9 g/dL (ref 30.0–36.0)
MCV: 85.8 fL (ref 80.0–100.0)
Monocytes Absolute: 1.5 10*3/uL — ABNORMAL HIGH (ref 0.1–1.0)
Monocytes Relative: 9 %
Neutro Abs: 10.6 10*3/uL — ABNORMAL HIGH (ref 1.7–7.7)
Neutrophils Relative %: 66 %
Platelets: 395 10*3/uL (ref 150–400)
RBC: 2.61 MIL/uL — ABNORMAL LOW (ref 4.22–5.81)
RDW: 18.6 % — ABNORMAL HIGH (ref 11.5–15.5)
WBC: 16.1 10*3/uL — ABNORMAL HIGH (ref 4.0–10.5)
nRBC: 3.7 % — ABNORMAL HIGH (ref 0.0–0.2)

## 2020-10-09 MED ORDER — DIPHENHYDRAMINE HCL 50 MG/ML IJ SOLN
25.0000 mg | Freq: Once | INTRAMUSCULAR | Status: AC
  Administered 2020-10-09: 25 mg via INTRAVENOUS
  Filled 2020-10-09: qty 1

## 2020-10-09 MED ORDER — MORPHINE SULFATE 2 MG/ML IV SOLN
INTRAVENOUS | Status: DC
  Administered 2020-10-09: 6 mg via INTRAVENOUS
  Administered 2020-10-09: 0 mg via INTRAVENOUS
  Administered 2020-10-10: 7.5 mg via INTRAVENOUS
  Administered 2020-10-10: 2 mg via INTRAVENOUS
  Administered 2020-10-10: 15 mL via INTRAVENOUS
  Administered 2020-10-10: 1.5 mg via INTRAVENOUS
  Administered 2020-10-10: 2.79 mg via INTRAVENOUS
  Administered 2020-10-11: 0 mg via INTRAVENOUS
  Administered 2020-10-11: 1.5 mg via INTRAVENOUS
  Filled 2020-10-09 (×2): qty 25

## 2020-10-09 MED ORDER — SENNOSIDES-DOCUSATE SODIUM 8.6-50 MG PO TABS
1.0000 | ORAL_TABLET | Freq: Two times a day (BID) | ORAL | Status: DC
  Administered 2020-10-09 – 2020-10-10 (×4): 1 via ORAL
  Filled 2020-10-09 (×4): qty 1

## 2020-10-09 MED ORDER — POLYETHYLENE GLYCOL 3350 17 G PO PACK
17.0000 g | PACK | Freq: Every day | ORAL | Status: DC | PRN
  Administered 2020-10-09: 17 g via ORAL
  Filled 2020-10-09 (×2): qty 1

## 2020-10-09 NOTE — Progress Notes (Signed)
Subjective: [name] Hill is a 19 year old male with a medical history significant for sickle cell disease type SS, chronic pain syndrome, opiate dependence and tolerance, and history of anemia of chronic disease was admitted for hospital associated pneumonia in the setting of sickle cell pain crisis.  Today, patient is complaining of swelling primarily to right face.  Patient states that his face feels "tight".  However, there is no pain associated with the swelling.  He says that headache is significantly better since discontinuing Dilaudid.  He says that he continues to have pain primarily to right arm.  Pain intensity is 5/10. Patient denies any subjective fever, cough, chest pain, shortness of breath, dizziness, nausea, vomiting, or diarrhea.  Patient denies any blurred vision or paresthesias.  Objective:  Vital signs in last 24 hours:  Vitals:   10/09/20 0749 10/09/20 0957 10/09/20 1212 10/09/20 1216  BP:  122/66    Pulse:  89    Resp: (!) 22 20 16 20   Temp:  98.8 F (37.1 C)    TempSrc:  Oral    SpO2: 98% 100% 100% 100%  Weight:      Height:        Intake/Output from previous day:   Intake/Output Summary (Last 24 hours) at 10/09/2020 1313 Last data filed at 10/09/2020 0952 Gross per 24 hour  Intake 151 ml  Output 2400 ml  Net -2249 ml    Physical Exam: General: Alert, awake, oriented x3, in no acute distress.  HEENT: [place]/AT PEERL, EOMI.  Right periorbital edema, mild, nontender to palpation Neck: Trachea midline,  no masses, no thyromegal,y no JVD, no carotid bruit OROPHARYNX:  Moist, No exudate/ erythema/lesions.  Heart: Regular rate and rhythm, without murmurs, rubs, gallops, PMI non-displaced, no heaves or thrills on palpation.  Lungs: Clear to auscultation, no wheezing or rhonchi noted. No increased vocal fremitus resonant to percussion  Abdomen: Soft, nontender, nondistended, positive bowel sounds, no masses no hepatosplenomegaly noted..  Neuro: No focal  neurological deficits noted cranial nerves II through XII grossly intact. DTRs 2+ bilaterally upper and lower extremities. Strength 5 out of 5 in bilateral upper and lower extremities. Musculoskeletal: No warm swelling or erythema around joints, no spinal tenderness noted. Psychiatric: Patient alert and oriented x3, good insight and cognition, good recent to remote recall. Lymph node survey: No cervical axillary or inguinal lymphadenopathy noted.  Lab Results:  Basic Metabolic Panel:    Component Value Date/Time   NA 133 (L) 10/08/2020 0610   NA 142 06/26/2018 1416   K 4.2 10/08/2020 0610   CL 97 (L) 10/08/2020 0610   CO2 27 10/08/2020 0610   BUN 8 10/08/2020 0610   BUN 7 06/26/2018 1416   CREATININE 0.54 (L) 10/08/2020 0610   GLUCOSE 128 (H) 10/08/2020 0610   CALCIUM 8.7 (L) 10/08/2020 0610   CBC:    Component Value Date/Time   WBC 16.1 (H) 10/09/2020 0430   HGB 7.6 (L) 10/09/2020 0430   HGB 9.6 (L) 06/26/2018 1416   HCT 22.4 (L) 10/09/2020 0430   HCT 28.5 (L) 06/26/2018 1416   PLT 395 10/09/2020 0430   PLT 405 06/26/2018 1416   MCV 85.8 10/09/2020 0430   MCV 92 06/26/2018 1416   NEUTROABS 10.6 (H) 10/09/2020 0430   NEUTROABS 3.7 06/26/2018 1416   LYMPHSABS 3.8 10/09/2020 0430   LYMPHSABS 3.3 (H) 06/26/2018 1416   MONOABS 1.5 (H) 10/09/2020 0430   EOSABS 0.2 10/09/2020 0430   EOSABS 0.2 06/26/2018 1416   BASOSABS 0.0 10/09/2020  0430   BASOSABS 0.1 06/26/2018 1416    Recent Results (from the past 240 hour(s))  Culture, blood (routine x 2)     Status: None   Collection Time: 10/01/20  8:45 PM   Specimen: BLOOD  Result Value Ref Range Status   Specimen Description   Final    BLOOD LEFT ANTECUBITAL Performed at [facility], [address], [place], [place] [address]    Special Requests   Final    BOTTLES DRAWN AEROBIC AND ANAEROBIC Blood Culture adequate volume Performed at [facility], [address] [place], [place]  [address]    Culture   Final    NO GROWTH 5 DAYS Performed at [facility], [address], [place], [place] [address]    Report Status 10/06/2020 FINAL  Final  Culture, blood (routine x 2)     Status: None   Collection Time: 10/01/20  8:45 PM   Specimen: BLOOD  Result Value Ref Range Status   Specimen Description   Final    BLOOD LEFT WRIST Performed at [facility], [address], [place], [place] [address]    Special Requests   Final    BOTTLES DRAWN AEROBIC AND ANAEROBIC Blood Culture adequate volume Performed at [facility], [address], [place], [place] [address]    Culture   Final    NO GROWTH 5 DAYS Performed at [facility], [address], [place], [place] [address]    Report Status 10/06/2020 FINAL  Final  MRSA PCR Screening     Status: None   Collection Time: 10/02/20 12:32 AM   Specimen: Nasopharyngeal  Result Value Ref Range Status   MRSA by PCR NEGATIVE NEGATIVE Final    Comment:        The GeneXpert MRSA Assay (FDA approved for NASAL specimens only), is one component of a comprehensive MRSA colonization surveillance program. It is not intended to diagnose MRSA infection nor to guide or monitor treatment for MRSA infections. Performed at [facility], [address] [place], [place] [address]     Studies/Results: No results found.  Medications: Scheduled Meds: . Chlorhexidine Gluconate Cloth  6 each Topical Daily  . diphenhydrAMINE  25 mg Intravenous Once  . losartan  50 mg Oral Daily  . mouth rinse  15 mL Mouth Rinse BID  . morphine   Intravenous Q4H  . senna-docusate  1 tablet Oral BID  . tiZANidine  4 mg Oral TID   Continuous Infusions: . dextrose 5 % and 0.45% NaCl 50 mL/hr at 10/09/20 1036  . diphenhydrAMINE     PRN Meds:.acetaminophen **OR** acetaminophen, diphenhydrAMINE **OR** diphenhydrAMINE, ibuprofen, ipratropium-albuterol, labetalol, lip balm, naloxone **AND**  sodium chloride flush, ondansetron (ZOFRAN) IV, oxyCODONE, polyethylene glycol, sodium chloride flush  Consultants:  None  Procedures:  None  Antibiotics:  None  Assessment/Plan: Principal Problem:   Acute chest syndrome due to sickle cell crisis (HCC) Active Problems:   Sepsis (HCC)   Sickle cell pain crisis (HCC)   Leukocytosis   Acute chest syndrome (HCC)   Hypertension   SOB (shortness of breath)   Respiratory failure with hypoxia (HCC)   HCAP (healthcare-associated pneumonia)  Sickle cell disease with acute chest syndrome: Improving.  Oxygen saturation is 98% on RA.  Patient is s/p exchange transfusion. Hemoglobin is 7.6, slightly below patient's baseline.  May be hemodilution, discontinue IV fluid today.  Continue morphine via PCA, decrease settings. Monitor vital  signs closely, reevaluate pain scale regularly, and supplemental ox as needed. Probable sepsis due to HCAP: Patient is afebrile.  Overall symptoms improved.  IV antibiotic completed.  Sickle cell anemia: Hemoglobin 7.6, below patient's baseline.  More than likely due to hemodilution, decrease IV fluids.  Patient is status post exchange transfusion.  No indication for blood transfusion at this time.  Repeat CBC and type and screen in a.m.  If hemoglobin less than 7, consider transfusing 1 unit PRBCs.  Chronic pain syndrome: Continue home pain medications  Essential hypertension: Stable.  Continue home medications.  Intractable headache: CT of head negative for acute process.  Improved since discontinuing IV Dilaudid.  Continue IV morphine for pain control.  Ibuprofen and Tylenol as needed for headache.  Patient has some mild swelling to right face, discontinued IV fluids..  Continue to monitor closely.  Code Status: Full Code Family Communication: N/A Disposition Plan: Not yet ready for discharge   Richar Dunklee Rennis Petty  APRN, MSN, FNP-C Patient Care Center [facility] [address]  [place], [place] [address] [phone]  If 5PM-5AM, please contact night-coverage.  10/09/2020, 1:13 PM  LOS: 8 days

## 2020-10-10 DIAGNOSIS — G44311 Acute post-traumatic headache, intractable: Secondary | ICD-10-CM

## 2020-10-10 DIAGNOSIS — A403 Sepsis due to Streptococcus pneumoniae: Secondary | ICD-10-CM

## 2020-10-10 DIAGNOSIS — E871 Hypo-osmolality and hyponatremia: Secondary | ICD-10-CM

## 2020-10-10 LAB — COMPREHENSIVE METABOLIC PANEL WITH GFR
ALT: 27 U/L (ref 0–44)
AST: 24 U/L (ref 15–41)
Albumin: 3.5 g/dL (ref 3.5–5.0)
Alkaline Phosphatase: 183 U/L — ABNORMAL HIGH (ref 38–126)
Anion gap: 4 — ABNORMAL LOW (ref 5–15)
BUN: 10 mg/dL (ref 6–20)
CO2: 30 mmol/L (ref 22–32)
Calcium: 8.8 mg/dL — ABNORMAL LOW (ref 8.9–10.3)
Chloride: 95 mmol/L — ABNORMAL LOW (ref 98–111)
Creatinine, Ser: 0.67 mg/dL (ref 0.61–1.24)
GFR, Estimated: >60 mL/min
Glucose, Bld: 114 mg/dL — ABNORMAL HIGH (ref 70–99)
Potassium: 4.6 mmol/L (ref 3.5–5.1)
Sodium: 129 mmol/L — ABNORMAL LOW (ref 135–145)
Total Bilirubin: 2.8 mg/dL — ABNORMAL HIGH (ref 0.3–1.2)
Total Protein: 7.6 g/dL (ref 6.5–8.1)

## 2020-10-10 LAB — CBC
HCT: 22.2 % — ABNORMAL LOW (ref 39.0–52.0)
Hemoglobin: 7.8 g/dL — ABNORMAL LOW (ref 13.0–17.0)
MCH: 29.1 pg (ref 26.0–34.0)
MCHC: 35.1 g/dL (ref 30.0–36.0)
MCV: 82.8 fL (ref 80.0–100.0)
Platelets: 546 10*3/uL — ABNORMAL HIGH (ref 150–400)
RBC: 2.68 MIL/uL — ABNORMAL LOW (ref 4.22–5.81)
RDW: 18.1 % — ABNORMAL HIGH (ref 11.5–15.5)
WBC: 17.5 10*3/uL — ABNORMAL HIGH (ref 4.0–10.5)
nRBC: 2.3 % — ABNORMAL HIGH (ref 0.0–0.2)

## 2020-10-10 MED ORDER — METHYLPREDNISOLONE SODIUM SUCC 125 MG IJ SOLR
60.0000 mg | Freq: Once | INTRAMUSCULAR | Status: AC
  Administered 2020-10-10: 60 mg via INTRAMUSCULAR
  Filled 2020-10-10: qty 2

## 2020-10-10 MED ORDER — ACETAMINOPHEN 500 MG PO TABS
1000.0000 mg | ORAL_TABLET | Freq: Once | ORAL | Status: AC
  Administered 2020-10-10: 1000 mg via ORAL
  Filled 2020-10-10: qty 2

## 2020-10-10 NOTE — Progress Notes (Signed)
Subjective: [name] Hill is a 19 year old male with a medical history significant for sickle cell disease type SS, chronic pain syndrome, opiate dependence and tolerance, and history of anemia of chronic disease was admitted for hospital associated pneumonia in the setting of sickle cell pain crisis.  Patient has no new complaints on today.  He states that his face continues to feel swollen, primarily on the right side.  However, there is no pain associated with swelling.  Patient endorses slight frontal headache and today, which is improved from yesterday.  Pain is primarily to low back, upper, and lower extremities.  Pain intensity is 5/10.  Patient denies any fever, cough, chest pain, shortness of breath, dizziness, nausea, vomiting, or diarrhea.  Objective:  Vital signs in last 24 hours:  Vitals:   10/10/20 1206 10/10/20 1353 10/10/20 1624 10/10/20 1707  BP:  (!) 98/55  120/65  Pulse:  89  80  Resp: 20 20 18 18   Temp:  98.2 F (36.8 C)  98.9 F (37.2 C)  TempSrc:  Oral  Oral  SpO2: 99% 100% 100% 98%  Weight:      Height:        Intake/Output from previous day:   Intake/Output Summary (Last 24 hours) at 10/10/2020 1745 Last data filed at 10/10/2020 0815 Gross per 24 hour  Intake --  Output 2200 ml  Net -2200 ml    Physical Exam: General: Alert, awake, oriented x3, in no acute distress.  HEENT: [place]/AT PEERL, EOMI mild periorbital edema, primarily to right face Neck: Trachea midline,  no masses, no thyromegal,y no JVD, no carotid bruit OROPHARYNX:  Moist, No exudate/ erythema/lesions.  Heart: Regular rate and rhythm, without murmurs, rubs, gallops, PMI non-displaced, no heaves or thrills on palpation.  Lungs: Clear to auscultation, no wheezing or rhonchi noted. No increased vocal fremitus resonant to percussion  Abdomen: Soft, nontender, nondistended, positive bowel sounds, no masses no hepatosplenomegaly noted..  Neuro: No focal neurological deficits noted cranial nerves  II through XII grossly intact. DTRs 2+ bilaterally upper and lower extremities. Strength 5 out of 5 in bilateral upper and lower extremities. Musculoskeletal: No warm swelling or erythema around joints, no spinal tenderness noted. Psychiatric: Patient alert and oriented x3, good insight and cognition, good recent to remote recall. Lymph node survey: No cervical axillary or inguinal lymphadenopathy noted.  Lab Results:  Basic Metabolic Panel:    Component Value Date/Time   NA 129 (L) 10/10/2020 0623   NA 142 06/26/2018 1416   K 4.6 10/10/2020 0623   CL 95 (L) 10/10/2020 0623   CO2 30 10/10/2020 0623   BUN 10 10/10/2020 0623   BUN 7 06/26/2018 1416   CREATININE 0.67 10/10/2020 0623   GLUCOSE 114 (H) 10/10/2020 0623   CALCIUM 8.8 (L) 10/10/2020 0623   CBC:    Component Value Date/Time   WBC 17.5 (H) 10/10/2020 0623   HGB 7.8 (L) 10/10/2020 0623   HGB 9.6 (L) 06/26/2018 1416   HCT 22.2 (L) 10/10/2020 0623   HCT 28.5 (L) 06/26/2018 1416   PLT 546 (H) 10/10/2020 0623   PLT 405 06/26/2018 1416   MCV 82.8 10/10/2020 0623   MCV 92 06/26/2018 1416   NEUTROABS 10.6 (H) 10/09/2020 0430   NEUTROABS 3.7 06/26/2018 1416   LYMPHSABS 3.8 10/09/2020 0430   LYMPHSABS 3.3 (H) 06/26/2018 1416   MONOABS 1.5 (H) 10/09/2020 0430   EOSABS 0.2 10/09/2020 0430   EOSABS 0.2 06/26/2018 1416   BASOSABS 0.0 10/09/2020 0430   BASOSABS  0.1 06/26/2018 1416    Recent Results (from the past 240 hour(s))  Culture, blood (routine x 2)     Status: None   Collection Time: 10/01/20  8:45 PM   Specimen: BLOOD  Result Value Ref Range Status   Specimen Description   Final    BLOOD LEFT ANTECUBITAL Performed at [facility], [address], [place], [place] [address]    Special Requests   Final    BOTTLES DRAWN AEROBIC AND ANAEROBIC Blood Culture adequate volume Performed at [facility], [address], [place], [place] [address]    Culture   Final    NO GROWTH 5  DAYS Performed at [facility], [address] [place], [place] [address]    Report Status 10/06/2020 FINAL  Final  Culture, blood (routine x 2)     Status: None   Collection Time: 10/01/20  8:45 PM   Specimen: BLOOD  Result Value Ref Range Status   Specimen Description   Final    BLOOD LEFT WRIST Performed at [facility], [address], [place], [place] [address]    Special Requests   Final    BOTTLES DRAWN AEROBIC AND ANAEROBIC Blood Culture adequate volume Performed at [facility], [address], [place], [place] [address]    Culture   Final    NO GROWTH 5 DAYS Performed at [facility], [address] [place], [place] [address]    Report Status 10/06/2020 FINAL  Final  MRSA PCR Screening     Status: None   Collection Time: 10/02/20 12:32 AM   Specimen: Nasopharyngeal  Result Value Ref Range Status   MRSA by PCR NEGATIVE NEGATIVE Final    Comment:        The GeneXpert MRSA Assay (FDA approved for NASAL specimens only), is one component of a comprehensive MRSA colonization surveillance program. It is not intended to diagnose MRSA infection nor to guide or monitor treatment for MRSA infections. Performed at [facility], [address], [place], [place] [address]     Studies/Results: No results found.  Medications: Scheduled Meds: . Chlorhexidine Gluconate Cloth  6 each Topical Daily  . losartan  50 mg Oral Daily  . mouth rinse  15 mL Mouth Rinse BID  . morphine   Intravenous Q4H  . senna-docusate  1 tablet Oral BID  . tiZANidine  4 mg Oral TID   Continuous Infusions: . diphenhydrAMINE     PRN Meds:.acetaminophen **OR** acetaminophen, diphenhydrAMINE **OR** diphenhydrAMINE, ipratropium-albuterol, labetalol, lip balm, naloxone **AND** sodium chloride flush, ondansetron (ZOFRAN) IV, oxyCODONE, polyethylene glycol, sodium chloride  flush  Consultants:  None  Procedures:  None  Antibiotics:  Completed   Assessment/Plan: Principal Problem:   Acute chest syndrome due to sickle cell crisis (HCC) Active Problems:   Sepsis (HCC)   Sickle cell pain crisis (HCC)   Leukocytosis   Acute chest syndrome (HCC)   Hypertension   SOB (shortness of breath)   Respiratory failure with hypoxia (HCC)   HCAP (healthcare-associated pneumonia)   Acute hyponatremia  Sickle cell disease with acute chest syndrome: Patient continues to have pain.  No changes with IV Dilaudid PCA on today. Hemoglobin is consistent with patient's baseline creatinine Monitor vital signs closely, reevaluate pain scale regularly, and stay on oxygen as needed  Sepsis due to HCAP: Patient continues to be afebrile.  Overall symptoms improved.  IV antibiotics completed  Sickle cell anemia:  Hemoglobin  7.8, increased from previous.  Patient is status post exchange transfusion.  No indication for blood transfusion at this time.  Repeat CBC in a.m.  Chronic pain syndrome: Continue home medications  Essential hypertension: Stable.  Continue home medication of  Intractable headache: CT of head was negative for any acute process.  Patient states that headache has improved over the past several days.  Symptoms improving.  Sodium decreased at 129, repeat in a.m.  Consider MRI in a.m. if headache persists.  Patient continues to have swelling, primarily to right face.  Code Status: Full Code Family Communication: Patient's grandmother, [name] Hill is at bedside, all questions addressed. Disposition Plan: Not yet ready for discharge  Nolon Nations  APRN, MSN, FNP-C Patient Care [facility] [address] [place], [place] [address] [phone]  If 5PM-8AM, please contact night-coverage.  10/10/2020, 5:45 PM  LOS: 9 days

## 2020-10-11 DIAGNOSIS — E871 Hypo-osmolality and hyponatremia: Secondary | ICD-10-CM

## 2020-10-11 LAB — BASIC METABOLIC PANEL
Anion gap: 10 (ref 5–15)
Anion gap: 10 (ref 5–15)
BUN: 34 mg/dL — ABNORMAL HIGH (ref 6–20)
BUN: 34 mg/dL — ABNORMAL HIGH (ref 6–20)
CO2: 26 mmol/L (ref 22–32)
CO2: 28 mmol/L (ref 22–32)
Calcium: 9 mg/dL (ref 8.9–10.3)
Calcium: 8.9 mg/dL (ref 8.9–10.3)
Chloride: 94 mmol/L — ABNORMAL LOW (ref 98–111)
Chloride: 97 mmol/L — ABNORMAL LOW (ref 98–111)
Creatinine, Ser: 1.29 mg/dL — ABNORMAL HIGH (ref 0.61–1.24)
Creatinine, Ser: 0.8 mg/dL (ref 0.61–1.24)
GFR, Estimated: >60 mL/min (ref >60)
GFR, Estimated: >60 mL/min (ref >60)
Glucose, Bld: 121 mg/dL — ABNORMAL HIGH (ref 70–99)
Glucose, Bld: 105 mg/dL — ABNORMAL HIGH (ref 70–99)
Potassium: 5.6 mmol/L — ABNORMAL HIGH (ref 3.5–5.1)
Potassium: 4.5 mmol/L (ref 3.5–5.1)
Sodium: 130 mmol/L — ABNORMAL LOW (ref 135–145)
Sodium: 135 mmol/L (ref 135–145)

## 2020-10-11 LAB — CBC
HCT: 21.3 % — ABNORMAL LOW (ref 39.0–52.0)
Hemoglobin: 7.4 g/dL — ABNORMAL LOW (ref 13.0–17.0)
MCH: 28.4 pg (ref 26.0–34.0)
MCHC: 34.7 g/dL (ref 30.0–36.0)
MCV: 81.6 fL (ref 80.0–100.0)
Platelets: 635 10*3/uL — ABNORMAL HIGH (ref 150–400)
RBC: 2.61 MIL/uL — ABNORMAL LOW (ref 4.22–5.81)
RDW: 18.1 % — ABNORMAL HIGH (ref 11.5–15.5)
WBC: 12.8 10*3/uL — ABNORMAL HIGH (ref 4.0–10.5)
nRBC: 1.8 % — ABNORMAL HIGH (ref 0.0–0.2)

## 2020-10-11 NOTE — Discharge Instructions (Signed)
Sickle Cell Anemia, Adult  Sickle cell anemia is a condition where your red blood cells are shaped like sickles. Red blood cells carry oxygen through the body. Sickle-shaped cells do not live as long as normal red blood cells. They also clump together and block blood from flowing through the blood vessels. This prevents the body from getting enough oxygen. Sickle cell anemia causes organ damage and pain. It also increases the risk of infection. Follow these instructions at home: Medicines  Take over-the-counter and prescription medicines only as told by your doctor.  If you were prescribed an antibiotic medicine, take it as told by your doctor. Do not stop taking the antibiotic even if you start to feel better.  If you develop a fever, do not take medicines to lower the fever right away. Tell your doctor about the fever. Managing pain, stiffness, and swelling  Try these methods to help with pain: ? Use a heating pad. ? Take a warm bath. ? Distract yourself, such as by watching TV. Eating and drinking  Drink enough fluid to keep your pee (urine) clear or pale yellow. Drink more in hot weather and during exercise.  Limit or avoid alcohol.  Eat a healthy diet. Eat plenty of fruits, vegetables, whole grains, and lean protein.  Take vitamins and supplements as told by your doctor. Traveling  When traveling, keep these with you: ? Your medical information. ? The names of your doctors. ? Your medicines.  If you need to take an airplane, talk to your doctor first. Activity  Rest often.  Avoid exercises that make your heart beat much faster, such as jogging. General instructions  Do not use products that have nicotine or tobacco, such as cigarettes and e-cigarettes. If you need help quitting, ask your doctor.  Consider wearing a medical alert bracelet.  Avoid being in high places (high altitudes), such as mountains.  Avoid very hot or cold temperatures.  Avoid places where the  temperature changes a lot.  Keep all follow-up visits as told by your doctor. This is important. Contact a doctor if:  A joint hurts.  Your feet or hands hurt or swell.  You feel tired (fatigued). Get help right away if:  You have symptoms of infection. These include: ? Fever. ? Chills. ? Being very tired. ? Irritability. ? Poor eating. ? Throwing up (vomiting).  You feel dizzy or faint.  You have new stomach pain, especially on the left side.  You have a an erection (priapism) that lasts more than 4 hours.  You have numbness in your arms or legs.  You have a hard time moving your arms or legs.  You have trouble talking.  You have pain that does not go away when you take medicine.  You are short of breath.  You are breathing fast.  You have a long-term cough.  You have pain in your chest.  You have a bad headache.  You have a stiff neck.  Your stomach looks bloated even though you did not eat much.  Your skin is pale.  You suddenly cannot see well. Summary  Sickle cell anemia is a condition where your red blood cells are shaped like sickles.  Follow your doctor's advice on ways to manage pain, food to eat, activities to do, and steps to take for safe travel.  Get medical help right away if you have any signs of infection, such as a fever. This information is not intended to replace advice given to you by   your health care provider. Make sure you discuss any questions you have with your health care provider. Document Revised: 03/05/2019 Document Reviewed: 12/17/2016 Elsevier Patient Education  2020 [employer]  

## 2020-10-11 NOTE — Discharge Summary (Signed)
Physician Discharge Summary  [name] Hill HQI:[id number]:[id number] DOB: 2001-10-14 DOA: 10/01/2020  PCP: Kallie LocksStroud, Natalie M, FNP  Admit date: 10/01/2020  Discharge date: 10/11/2020  Discharge Diagnoses:  Principal Problem:   Acute chest syndrome due to sickle cell crisis [facility](HCC) Active Problems:   Sepsis (HCC)   Sickle cell pain crisis (HCC)   Leukocytosis   Acute chest syndrome (HCC)   Hypertension   SOB (shortness of breath)   Respiratory failure with hypoxia (HCC)   HCAP (healthcare-associated pneumonia)   Acute hyponatremia   Discharge Condition: Stable  Disposition:  Pt is discharged home in good condition and is to follow up with Kallie LocksStroud, Natalie M, FNP this week to have labs evaluated. 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  The patient reports 1 to 2 days of generalized arthralgias and myalgias, which she reports is typical in quality and distribution relative to that which he is experienced at the time of previous episodes of sickle cell pain crisis.  Reports no significant improvement in the intensity of his presenting generalized pain with his outpatient medications as needed oxycodone.  Over the last 1 to 2 days he has reported new onset productive cough with some associated green-colored sputum.  He also reports associated mild  shortness of breath over this time.  He denies any associated orthopnea, PND, peripheral edema, hemoptysis, or calf tenderness.  Denies any associated subjective fever, chills, or rigors.  Denies any associated dysuria, gross hematuria, or change in urinary urgency-frequency.  Associated with his generalized pain, he reports some nonexternal chest discomfort which he says is occur with previous episodes of sickle cell pain crisis.  No radiation into the back between the shoulder blades.  Denies any recent trauma, period of prolonged diminished ambulatory status, or recent travel.  He denies any associated diaphoresis, palpitations, dizziness, presyncope, or syncope.  He also denies any associated nausea or vomiting.  No recent abdominal discomfort or diarrhea. Denies any recent headache, neck stiffness, rhinitis, rhinorrhea, sore throat, wheezing, or rash. Of note, the patient [facility] on [facility]2020 with symptoms similar to the above, which time he was admitted and treated at the associated transfusion center with Dilaudid PCA, IV fluids, and Toradol before subsequent discharge to home on 09/20/2020. Per chart review, the patient was also hospitalized with sickle cell pain crisis on 08/23/2020 through 08/27/2020 at which time he received IV fluids and Dilaudid PCA.  Of note, the patient was diagnosed with COVID-19 infection with positive PCR on 08/19/2020 at which time he is experience shortness of breath and cough he reports that the symptoms have subsequently completely resolved and that he has not been experiencing any acute respiratory symptoms in between after mentioned hospitalization for sickle cell pain crisis.  ED course: Vital signs in the ER were notable for the following: Temperature max 98.1, heart  rate 76-1 13, blood pressure 143/71, respiratory rate 15-21, oxygen saturation 95% on RA.  Labs were notable for the following: CMP was notable for the following sodium 140,  potassium 4.6, bicarbonate 26 anion gap 9, creatinine 0.60 relative to most recent prior creatinine data point of 0.65 on 09/20/2020, alkaline phosphatase 93, AST 46, ALT 21, total bilirubin 4.1 compared to 3.1 on 09/20/2020.  CBC notable for the following: White blood cell count of 18,600 compared to 11,300 on 09/20/2020, hemoglobin 10.9 relative to 10.2 on 09/20/2020, platelets 341.  Lactic acid 1.4, high-sensitivity troponin is 4.  Blood cultures x2 were collected prior to initiation of antibiotics. Chest x-ray showed no evidence of acute cardiopulmonary process including no evidence of infiltrate, edema, or pneumothorax, or pleural effusion.  CT of chest showed no evidence of acute pulmonary embolism, but showed evidence of patchy bilateral airspace opacity while showing no evidence of pneumothorax or pleural effusion.  EKG in comparison to most recent prior EKG on 08/24/2020 shows sinus rhythm with heart rate 88. While in the ED, the following were administered: Dilaudid 2 mg IV x5, Toradol 15 mg IV x1, Zofran 4 mg IV x1, azithromycin 500 mg IV x1, Rocephin 1 g IV x1 and the patient was started on half-normal saline at 100 cc/h.  Subsequently, he was admitted to [facility] for further evaluation and management of presenting sickle cell pain crisis.  Hospital Course:  Sickle cell disease with pain crisis: Patient was admitted for sickle cell pain crisis and managed appropriately with IVF, IV Dilaudid via PCA and IV Toradol, as well as other adjunct therapies per sickle cell pain management protocols.  Completed Toradol for a total of 5 days.  IV Dilaudid PCA was weaned and was eventually discontinued due to ongoing headache.  Headache improved when transition to IV morphine PCA.  Home medications were reintroduced as pain improved.  Home medications include oxycodone 15 mg every 4 hours as needed for severe breakthrough pain.  Patient will follow-up with PCP for medication management.  He  states that he is not having any pain at this time and is requesting discharge home.  He says that he has medication at home and will request from PCP.  Patient continues to have some mild swelling to right face without any associated pain.  He is scheduled to follow-up with PCP on 10/17/2020.  Acute chest syndrome: On admission, CT of the chest showed no evidence of pulmonary embolism, pneumothorax, or pleural effusion.  However showed evidence of patchy bilateral airspace opacities.  Also, patient had oxygen requirement up to 7 L and was transition to stepdown at that juncture.  Oxygen requirements improved.  Patient completed course of IV antibiotics including cefepime.  Today, patient is afebrile and oxygen saturation is 98% on RA.  Patient encouraged to continue utilizing incentive spirometry at discharge.  Patient does not warrant home oxygen.  Ambulating pulse oximetry showed oxygen saturation remaining above 90% with ambulation.  Intractable headache: Patient had a 3-day history of intractable headache that was not improved with analgesics or anti-inflammatory medications.  Patient underwent CT of head, showed no acute process.  Blood showed mild chronic right lamina papyracea fracture.  IV Dilaudid PCA was discontinued and patient was transitioned to IV morphine.  Headache eventually improved.  Patient is not having headache, dizziness, blurred vision, or paresthesias at this time.  Patient is ambulating without assistance.  Hypertension: Patient was continued on losartan 50 mg daily without interruption.  This a.m., creatinine  increased to 1.3, which is significantly above his baseline.  Labs were repeated prior to discharge and creatinine is within normal range.  Patient advised to continue losartan daily and follow-up with PCP to repeat BMP on 10/17/2020.  Also, over the past several days some hyponatremia.  Hyponatremia has resolved and sodium level is 135 today.  Patient is afebrile, alert,  oriented, and ambulating without assistance.  He is aware of all upcoming appointments.  Patient is aware of current medication regimen and will resume home medications.  Patient advised to continue hydrating with 32 ounces of fluid per day.  Also, patient advised not to return to work prior to follow-up appointment with PCP.  He expressed understanding.  Patient has a home support system including his grandmother Aram Beecham who will be transporting him home today.  Patient was therefore discharged home today in a hemodynamically stable condition.   Ziyan will follow-up with PCP within 1 week of this discharge. Ayaan was counseled extensively about nonpharmacologic means of pain management, patient verbalized understanding and was appreciative of  the care received during this admission.   We discussed the need for good hydration, monitoring of hydration status, avoidance of heat, cold, stress, and infection triggers. We discussed the need to be adherent with taking Hydrea and other home medications. Patient was reminded of the need to seek medical attention immediately if any symptom of bleeding, anemia, or infection occurs.  Discharge Exam: Vitals:   10/11/20 0953 10/11/20 1156  BP: (!) 119/58   Pulse: 88   Resp: 18 (!) 21  Temp: 99.1 F (37.3 C)   SpO2: 96% 95%   Vitals:   10/11/20 0800 10/11/20 0826 10/11/20 0953 10/11/20 1156  BP:  (!) 123/55 (!) 119/58   Pulse:  78 88   Resp: (!) 21  Temp:  97.8 F (36.6 C) 99.1 F (37.3 C)   TempSrc:  Oral Oral   SpO2: 100% 99% 96% 95%  Weight:      Height:        General appearance : Awake, alert, not in any distress. Speech Clear. Not toxic looking.  Mild periorbital edema, mostly on right side. HEENT: Atraumatic and Normocephalic, pupils equally reactive to light and accomodation Neck: Supple, no JVD. No cervical lymphadenopathy.  Chest: Good air entry bilaterally, no added sounds  CVS: S1 S2 regular, no murmurs.  Abdomen: Bowel  sounds present, Non tender and not distended with no gaurding, rigidity or rebound. Extremities: B/L Lower Ext shows no edema, both legs are warm to touch Neurology: Awake alert, and oriented X 3, CN II-XII intact, Non focal Skin: No Rash  Discharge Instructions  Discharge Instructions    Discharge patient   Complete by: As directed    Discharge disposition: 01-Home or Self Care   Discharge patient date: 10/11/2020     Allergies as of 10/11/2020      Reactions   Lisinopril Cough      Medication List    TAKE these medications   acetaminophen 500 MG tablet Commonly known as: TYLENOL Take 1,000 mg by mouth daily as needed (severe pain).   benzonatate 100 MG capsule Commonly known as: TESSALON Take 1 capsule (100 mg total) by mouth 2 (two) times daily as needed for cough.   folic acid 1 MG tablet Commonly known as: FOLVITE Take 1 tablet (1 mg total) by mouth daily.   hydroxyurea 400 MG capsule Commonly known as: DROXIA Take 4 capsules (1,600 mg total) by mouth daily.  ibuprofen 200 MG tablet Commonly known as: ADVIL Take 200 mg by mouth every 6 (six) hours as needed for moderate pain.   losartan 50 MG tablet Commonly known as: COZAAR Take 50 mg by mouth daily.   Oxycodone HCl 10 MG Tabs Take 1 tablet (10 mg total) by mouth every 4 (four) hours as needed. What changed: reasons to take this       The results of significant diagnostics from this hospitalization (including imaging, microbiology, ancillary and laboratory) are listed below for reference.    Significant Diagnostic Studies: CT HEAD WO CONTRAST  Result Date: 10/06/2020 CLINICAL DATA:  19 year old male with headache since this morning, light sensitivity. Upper extremity weakness. EXAM: CT HEAD WITHOUT CONTRAST TECHNIQUE: Contiguous axial images were obtained from the base of the skull through the vertex without intravenous contrast. COMPARISON:  Face CT 04/03/2015. Brain MRI 10/30/2015. FINDINGS: Brain:  Cerebral volume is stable. No midline shift, ventriculomegaly, mass effect, evidence of mass lesion, intracranial hemorrhage or evidence of cortically based acute infarction. Gray-white matter differentiation is within normal limits throughout the brain. Vascular: No suspicious intracranial vascular hyperdensity. Skull: Mild chronic right lamina papyracea fracture. No acute osseous abnormality identified. Congenital incomplete ossification of the posterior C1 ring. Sinuses/Orbits: Visualized paranasal sinuses and mastoids are stable and well pneumatized. Other: No acute orbit or scalp soft tissue finding. IMPRESSION: 1. Stable and negative noncontrast CT appearance of the brain. 2. Mild chronic right lamina papyracea fracture. Electronically Signed   By: Odessa Fleming M.D.   On: 10/06/2020 15:02   CT Angio Chest PE W and/or Wo Contrast  Result Date: 10/01/2020 CLINICAL DATA:  19 year old male with history of sickle cell disease. Shortness of breath and chest pain. EXAM: CT ANGIOGRAPHY CHEST WITH CONTRAST TECHNIQUE: Multidetector CT imaging of the chest was performed using the standard protocol during bolus administration of intravenous contrast. Multiplanar CT image reconstructions and MIPs were obtained to evaluate the vascular anatomy. CONTRAST:  OMNIPAQUE IOHEXOL 350 MG/ML SOLN COMPARISON:  Chest CT 08/21/2020. FINDINGS: Cardiovascular: There are no filling defects within the pulmonary arterial tree to suggest pulmonary embolism. Heart size is mildly enlarged. There is no significant pericardial fluid, thickening or pericardial calcification. No atherosclerotic calcifications in the thoracic aorta or the coronary arteries. Mediastinum/Nodes: No pathologically enlarged mediastinal or hilar lymph nodes. Esophagus is unremarkable in appearance. No axillary lymphadenopathy. Lungs/Pleura: Patchy areas of airspace consolidation and some volume loss are noted in the lower lobes of the lungs bilaterally, and in the  posterior aspect of the left upper lobe. No pleural effusions. No definite suspicious appearing pulmonary nodules or masses are noted. Upper Abdomen: Unremarkable. Musculoskeletal: Multiple H-shaped vertebral bodies characteristic of sickle cell disease. There are no aggressive appearing lytic or blastic lesions noted in the visualized portions of the skeleton. Review of the MIP images confirms the above findings. IMPRESSION: 1. No evidence of pulmonary embolism. 2. Patchy areas of consolidation and volume loss in the lungs bilaterally, indicative of acute chest syndrome in this patient with history of sickle cell disease. 3. Mild cardiomegaly. Electronically Signed   By: Trudie Reed M.D.   On: 10/01/2020 18:28   DG Chest Port 1 View  Result Date: 10/01/2020 CLINICAL DATA:  Sickle cell pain crisis. EXAM: PORTABLE CHEST 1 VIEW COMPARISON:  Chest radiograph 08/23/2020 FINDINGS: Stable cardiomediastinal contours with enlarged heart size. The lungs are clear. No pneumothorax or significant pleural effusion. No acute finding in the visualized skeleton. IMPRESSION: No acute cardiopulmonary finding. Electronically Signed  By: Emmaline Kluver M.D.   On: 10/01/2020 14:54    Microbiology: Recent Results (from the past 240 hour(s))  Culture, blood (routine x 2)     Status: None   Collection Time: 10/01/20  8:45 PM   Specimen: BLOOD  Result Value Ref Range Status   Specimen Description   Final    BLOOD LEFT ANTECUBITAL Performed at [facility], [address], [place], [place] [address]    Special Requests   Final    BOTTLES DRAWN AEROBIC AND ANAEROBIC Blood Culture adequate volume Performed at [facility], [address] [place], [place] [address]    Culture   Final    NO GROWTH 5 DAYS Performed at [facility], [address], [place], [place] [address]    Report Status 10/06/2020 FINAL  Final  Culture, blood (routine x 2)     Status: None    Collection Time: 10/01/20  8:45 PM   Specimen: BLOOD  Result Value Ref Range Status   Specimen Description   Final    BLOOD LEFT WRIST Performed at [facility], [address], [place], [place] [address]    Special Requests   Final    BOTTLES DRAWN AEROBIC AND ANAEROBIC Blood Culture adequate volume Performed at [facility], [address] [place], [place] [address]    Culture   Final    NO GROWTH 5 DAYS Performed at [facility], [address], [place], [place] [address]    Report Status 10/06/2020 FINAL  Final  MRSA PCR Screening     Status: None   Collection Time: 10/02/20 12:32 AM   Specimen: Nasopharyngeal  Result Value Ref Range Status   MRSA by PCR NEGATIVE NEGATIVE Final    Comment:        The GeneXpert MRSA Assay (FDA approved for NASAL specimens only), is one component of a comprehensive MRSA colonization surveillance program. It is not intended to diagnose MRSA infection nor to guide or monitor treatment for MRSA infections. Performed at [facility], [address], [place], [place] [address]      Labs: Basic Metabolic Panel: Recent Labs  Lab 10/05/20 0727 10/05/20 0727 10/08/20 0610 10/08/20 0610 10/10/20 0623 10/11/20 0526  NA 137  --  133*  --  129* 130*  K 4.2   < > 4.2   < > 4.6 5.6*  CL 103  --  97*  --  95* 94*  CO2 26  --  27  --  30 26  GLUCOSE 106*  --  128*  --  114* 121*  BUN 18  --  8  --  10 34*  CREATININE 0.52*  --  0.54*  --  0.67 1.29*  CALCIUM 8.6*  --  8.7*  --  8.8* 9.0   < > = values in this interval not displayed.   Liver Function Tests: Recent Labs  Lab 10/10/20 0623  AST 24  ALT 27  ALKPHOS 183*  BILITOT 2.8*  PROT 7.6  ALBUMIN 3.5   No results for input(s): LIPASE, AMYLASE in the last 168 hours. No results for input(s): AMMONIA in the last 168 hours. CBC: Recent Labs  Lab 10/07/20 0546 10/08/20 0610 10/09/20 0430 10/10/20 0623  10/11/20 0526  WBC 22.3* 18.4* 16.1* 17.5* 12.8*  NEUTROABS  --  13.4* 10.6*  --   --   HGB 7.7* 8.3* 7.6* 7.8* 7.4*  HCT 22.5*  24.1* 22.4* 22.2* 21.3*  MCV 85.9 85.5 85.8 82.8 81.6  PLT 328 381 395 546* 635*   Cardiac Enzymes: No results for input(s): CKTOTAL, CKMB, CKMBINDEX, TROPONINI in the last 168 hours. BNP: Invalid input(s): POCBNP CBG: No results for input(s): GLUCAP in the last 168 hours.  Time coordinating discharge: 50 minutes  Signed:  Nolon Nations  APRN, MSN, FNP-C [facility] [address] [place], [place] [address] [phone] [facility] 10/11/2020, 1:08 PM

## 2020-10-11 NOTE — Progress Notes (Signed)
Pt states he does not have an appointed legal guardian

## 2020-10-17 ENCOUNTER — Encounter: Payer: Self-pay | Admitting: Family Medicine

## 2020-10-17 ENCOUNTER — Ambulatory Visit (INDEPENDENT_AMBULATORY_CARE_PROVIDER_SITE_OTHER): Payer: Medicaid Other | Admitting: Family Medicine

## 2020-10-17 ENCOUNTER — Other Ambulatory Visit: Payer: Self-pay

## 2020-10-17 VITALS — BP 135/65 | HR 95 | Temp 98.7°F | Resp 17 | Ht 67.0 in | Wt 187.8 lb

## 2020-10-17 DIAGNOSIS — F1190 Opioid use, unspecified, uncomplicated: Secondary | ICD-10-CM

## 2020-10-17 DIAGNOSIS — D571 Sickle-cell disease without crisis: Secondary | ICD-10-CM

## 2020-10-17 DIAGNOSIS — D5701 Hb-SS disease with acute chest syndrome: Secondary | ICD-10-CM | POA: Diagnosis not present

## 2020-10-17 DIAGNOSIS — D5700 Hb-SS disease with crisis, unspecified: Secondary | ICD-10-CM | POA: Diagnosis not present

## 2020-10-17 DIAGNOSIS — Z09 Encounter for follow-up examination after completed treatment for conditions other than malignant neoplasm: Secondary | ICD-10-CM

## 2020-10-17 DIAGNOSIS — R634 Abnormal weight loss: Secondary | ICD-10-CM

## 2020-10-17 DIAGNOSIS — R109 Unspecified abdominal pain: Secondary | ICD-10-CM

## 2020-10-17 DIAGNOSIS — G894 Chronic pain syndrome: Secondary | ICD-10-CM

## 2020-10-17 DIAGNOSIS — R10A1 Flank pain, right side: Secondary | ICD-10-CM

## 2020-10-17 MED ORDER — OXYCODONE HCL 10 MG PO TABS: 10.0000 mg | ORAL_TABLET | ORAL | 0 refills | Status: DC | PRN

## 2020-10-17 MED ORDER — HYDROXYUREA 400 MG PO CAPS: 1600.0000 mg | ORAL_CAPSULE | Freq: Every day | ORAL | 11 refills | Status: DC
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
## 2020-11-06 ENCOUNTER — Ambulatory Visit: Payer: Medicaid Other | Admitting: Family Medicine

## 2020-11-20 ENCOUNTER — Encounter (HOSPITAL_COMMUNITY): Payer: Self-pay

## 2020-11-20 ENCOUNTER — Other Ambulatory Visit: Payer: Self-pay

## 2020-11-20 ENCOUNTER — Emergency Department (HOSPITAL_COMMUNITY)
Admission: EM | Admit: 2020-11-20 | Discharge: 2020-11-20 | Disposition: A | Discharge status: Home / Self Care | Payer: Medicaid Other | Attending: Emergency Medicine | Admitting: Emergency Medicine

## 2020-11-20 DIAGNOSIS — J45909 Unspecified asthma, uncomplicated: Secondary | ICD-10-CM | POA: Diagnosis not present

## 2020-11-20 DIAGNOSIS — I10 Essential (primary) hypertension: Secondary | ICD-10-CM | POA: Diagnosis not present

## 2020-11-20 DIAGNOSIS — Z79899 Other long term (current) drug therapy: Secondary | ICD-10-CM | POA: Insufficient documentation

## 2020-11-20 DIAGNOSIS — L980 Pyogenic granuloma: Secondary | ICD-10-CM | POA: Insufficient documentation

## 2020-11-20 DIAGNOSIS — K134 Granuloma and granuloma-like lesions of oral mucosa: Secondary | ICD-10-CM

## 2020-11-20 DIAGNOSIS — R220 Localized swelling, mass and lump, head: Secondary | ICD-10-CM | POA: Diagnosis present

## 2020-11-20 DIAGNOSIS — Z8616 Personal history of COVID-19: Secondary | ICD-10-CM | POA: Diagnosis not present
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
## 2020-11-20 NOTE — ED Notes (Signed)
Patient discharge instructions and follow-up care reviewed. The patient verbalized understanding of both. Patient discharged.

## 2020-11-20 NOTE — Discharge Instructions (Signed)
I suspect you have a benign granuloma or hemangioma  This is a small raised lump that easily bleeds. It can happen from certain medicines or bleeding from small vessels in that area  Pain is treated with over the counter medicines ibuprofen or acetaminophen, oragel  Soft foods, room temperature fluids   See your dentist within a week for re-evaluation  Return for increased swelling, pus, fevers

## 2020-11-20 NOTE — ED Triage Notes (Signed)
Pt is here today due to abscess on gum at top of his month above the 2 front teeth. Pt reports he developed last night . ptg reports he is in a lot of pain. Pt reports he took motrin PTA.

## 2020-11-21 ENCOUNTER — Telehealth (INDEPENDENT_AMBULATORY_CARE_PROVIDER_SITE_OTHER): Payer: Medicaid Other | Admitting: Family Medicine

## 2020-11-21 ENCOUNTER — Encounter: Payer: Self-pay | Admitting: Family Medicine

## 2020-11-21 DIAGNOSIS — K047 Periapical abscess without sinus: Secondary | ICD-10-CM

## 2020-11-21 DIAGNOSIS — K0889 Other specified disorders of teeth and supporting structures: Secondary | ICD-10-CM

## 2020-11-21 DIAGNOSIS — D571 Sickle-cell disease without crisis: Secondary | ICD-10-CM | POA: Diagnosis not present

## 2020-11-21 DIAGNOSIS — G894 Chronic pain syndrome: Secondary | ICD-10-CM

## 2020-11-21 DIAGNOSIS — F1190 Opioid use, unspecified, uncomplicated: Secondary | ICD-10-CM

## 2020-11-21 DIAGNOSIS — Z09 Encounter for follow-up examination after completed treatment for conditions other than malignant neoplasm: Secondary | ICD-10-CM | POA: Diagnosis not present

## 2020-11-21 MED ORDER — AMOXICILLIN-POT CLAVULANATE 875-125 MG PO TABS: 1.0000 | ORAL_TABLET | Freq: Two times a day (BID) | ORAL | 0 refills | Status: AC

## 2020-11-21 MED ORDER — IBUPROFEN 800 MG PO TABS: 800.0000 mg | ORAL_TABLET | Freq: Three times a day (TID) | ORAL | 3 refills | Status: DC | PRN
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
## 2020-12-18 ENCOUNTER — Ambulatory Visit: Payer: Medicaid Other | Admitting: Family Medicine

## 2021-01-08 ENCOUNTER — Non-Acute Institutional Stay (HOSPITAL_COMMUNITY)
Admission: AD | Admit: 2021-01-08 | Discharge: 2021-01-08 | Disposition: A | Discharge status: Home / Self Care | Payer: Medicaid Other | Source: Ambulatory Visit | Attending: Internal Medicine | Admitting: Internal Medicine

## 2021-01-08 ENCOUNTER — Encounter: Payer: Self-pay | Admitting: Family Medicine

## 2021-01-08 ENCOUNTER — Encounter (HOSPITAL_COMMUNITY): Payer: Self-pay | Admitting: Family Medicine

## 2021-01-08 DIAGNOSIS — Z8249 Family history of ischemic heart disease and other diseases of the circulatory system: Secondary | ICD-10-CM | POA: Insufficient documentation

## 2021-01-08 DIAGNOSIS — D5700 Hb-SS disease with crisis, unspecified: Secondary | ICD-10-CM | POA: Insufficient documentation

## 2021-01-08 DIAGNOSIS — G894 Chronic pain syndrome: Secondary | ICD-10-CM | POA: Insufficient documentation

## 2021-01-08 DIAGNOSIS — Z791 Long term (current) use of non-steroidal anti-inflammatories (NSAID): Secondary | ICD-10-CM | POA: Insufficient documentation

## 2021-01-08 DIAGNOSIS — Z79899 Other long term (current) drug therapy: Secondary | ICD-10-CM | POA: Insufficient documentation

## 2021-01-08 DIAGNOSIS — D638 Anemia in other chronic diseases classified elsewhere: Secondary | ICD-10-CM | POA: Insufficient documentation

## 2021-01-08 DIAGNOSIS — Z832 Family history of diseases of the blood and blood-forming organs and certain disorders involving the immune mechanism: Secondary | ICD-10-CM | POA: Insufficient documentation

## 2021-01-08 DIAGNOSIS — Z8349 Family history of other endocrine, nutritional and metabolic diseases: Secondary | ICD-10-CM | POA: Insufficient documentation

## 2021-01-08 DIAGNOSIS — F1120 Opioid dependence, uncomplicated: Secondary | ICD-10-CM | POA: Insufficient documentation

## 2021-01-08 DIAGNOSIS — Z888 Allergy status to other drugs, medicaments and biological substances status: Secondary | ICD-10-CM | POA: Diagnosis not present

## 2021-01-08 LAB — RETICULOCYTES
Immature Retic Fract: 48.3 % — ABNORMAL HIGH (ref 2.3–15.9)
RBC.: 3.42 MIL/uL — ABNORMAL LOW (ref 4.22–5.81)
Retic Count, Absolute: 248.3 10*3/uL — ABNORMAL HIGH (ref 19.0–186.0)
Retic Ct Pct: 7.3 % — ABNORMAL HIGH (ref 0.4–3.1)

## 2021-01-08 LAB — CBC WITH DIFFERENTIAL/PLATELET
Abs Immature Granulocytes: 0.34 10*3/uL — ABNORMAL HIGH (ref 0.00–0.07)
Basophils Absolute: 0.2 10*3/uL — ABNORMAL HIGH (ref 0.0–0.1)
Basophils Relative: 1 %
Eosinophils Absolute: 0.2 10*3/uL (ref 0.0–0.5)
Eosinophils Relative: 1 %
HCT: 29.5 % — ABNORMAL LOW (ref 39.0–52.0)
Hemoglobin: 10.5 g/dL — ABNORMAL LOW (ref 13.0–17.0)
Immature Granulocytes: 2 %
Lymphocytes Relative: 29 %
Lymphs Abs: 4.9 10*3/uL — ABNORMAL HIGH (ref 0.7–4.0)
MCH: 30.3 pg (ref 26.0–34.0)
MCHC: 35.6 g/dL (ref 30.0–36.0)
MCV: 85 fL (ref 80.0–100.0)
Monocytes Absolute: 1.9 10*3/uL — ABNORMAL HIGH (ref 0.1–1.0)
Monocytes Relative: 11 %
Neutro Abs: 9.5 10*3/uL — ABNORMAL HIGH (ref 1.7–7.7)
Neutrophils Relative %: 56 %
Platelets: 420 10*3/uL — ABNORMAL HIGH (ref 150–400)
RBC: 3.47 MIL/uL — ABNORMAL LOW (ref 4.22–5.81)
RDW: 20.9 % — ABNORMAL HIGH (ref 11.5–15.5)
WBC: 16.9 10*3/uL — ABNORMAL HIGH (ref 4.0–10.5)
nRBC: 0.8 % — ABNORMAL HIGH (ref 0.0–0.2)

## 2021-01-08 LAB — COMPREHENSIVE METABOLIC PANEL
ALT: 20 U/L (ref 0–44)
AST: 41 U/L (ref 15–41)
Albumin: 4.5 g/dL (ref 3.5–5.0)
Alkaline Phosphatase: 106 U/L (ref 38–126)
Anion gap: 9 (ref 5–15)
BUN: 6 mg/dL (ref 6–20)
CO2: 25 mmol/L (ref 22–32)
Calcium: 9.2 mg/dL (ref 8.9–10.3)
Chloride: 108 mmol/L (ref 98–111)
Creatinine, Ser: 0.62 mg/dL (ref 0.61–1.24)
GFR, Estimated: >60 mL/min (ref >60)
Glucose, Bld: 99 mg/dL (ref 70–99)
Potassium: 3.7 mmol/L (ref 3.5–5.1)
Sodium: 142 mmol/L (ref 135–145)
Total Bilirubin: 5.7 mg/dL — ABNORMAL HIGH (ref 0.3–1.2)
Total Protein: 7.2 g/dL (ref 6.5–8.1)

## 2021-01-08 MED ORDER — HYDROMORPHONE HCL 2 MG/ML IJ SOLN
2.0000 mg | INTRAMUSCULAR | Status: AC
  Administered 2021-01-08 (×2): 2 mg via INTRAVENOUS
  Filled 2021-01-08 (×3): qty 1

## 2021-01-08 MED ORDER — ACETAMINOPHEN 500 MG PO TABS
1000.0000 mg | ORAL_TABLET | Freq: Once | ORAL | Status: AC
  Administered 2021-01-08: 1000 mg via ORAL
  Filled 2021-01-08: qty 2

## 2021-01-08 MED ORDER — SODIUM CHLORIDE 0.45 % IV SOLN: INTRAVENOUS | Status: DC

## 2021-01-08 MED ORDER — KETOROLAC TROMETHAMINE 30 MG/ML IJ SOLN
15.0000 mg | Freq: Once | INTRAMUSCULAR | Status: AC
  Administered 2021-01-08: 15 mg via INTRAVENOUS
  Filled 2021-01-08: qty 1

## 2021-01-08 MED ORDER — ONDANSETRON HCL 4 MG/2ML IJ SOLN: 4.0000 mg | Freq: Once | INTRAMUSCULAR | Status: DC

## 2021-01-08 MED ORDER — DIPHENHYDRAMINE HCL 25 MG PO CAPS
25.0000 mg | ORAL_CAPSULE | Freq: Once | ORAL | Status: AC
  Administered 2021-01-08: 25 mg via ORAL
  Filled 2021-01-08: qty 1

## 2021-01-08 NOTE — Discharge Summary (Signed)
[facility] Discharge Summary   Patient ID: [name] Hill MRN: [id number] DOB/AGE: 02/14/2001 20 y.o.  Admit date: 01/08/2021 Discharge date: 01/08/2021  Primary Care Physician:  Kallie Locks, FNP  Admission Diagnoses:  Active Problems:   Sickle cell pain crisis [facility])   Discharge Medications:  Allergies as of 01/08/2021      Reactions   Lisinopril Cough      Medication List    TAKE these medications   acetaminophen 500 MG tablet Commonly known as: TYLENOL Take 1,000 mg by mouth daily as needed (severe pain).   folic acid 1 MG tablet Commonly known as: FOLVITE Take 1 tablet (1 mg total) by mouth daily.   hydroxyurea 400 MG capsule Commonly known as: DROXIA Take 4 capsules (1,600 mg total) by mouth daily.   ibuprofen 800 MG tablet Commonly known as: ADVIL Take 1 tablet (800 mg total) by mouth every 8 (eight) hours as needed.   losartan 50 MG tablet Commonly known as: COZAAR Take 50 mg by mouth daily.   Oxycodone HCl 10 MG Tabs Take 1 tablet (10 mg total) by mouth every 4 (four) hours as needed.        Consults:  None  Significant Diagnostic Studies:  No results found.  History of present illness:  [name] Hill is a 20 year old male with a medical history significant for sickle cell disease, chronic pain syndrome, opiate dependence and tolerance, and history of anemia of chronic disease presents complaining of generalized pain that is consistent with his typical pain crisis.  Patient says that he awakened suddenly this morning with headache, and pain to low back and lower extremities.  He says that pain was unrelieved by home medications.  He has not identified any provocative factors concerning crisis.  He says that it may be related to changes in weather.  He rates pain as 6/10 characterized as intermittent and throbbing.  He denies any blurred vision, dizziness, chest pain, subjective fever, chills, persistent cough, nausea, vomiting, or  diarrhea.  He has had no sick contacts, recent travel, or exposure to COVID-19.  [facility] Course: Patient admitted to sickle cell day infusion clinic for management of pain crisis.  Reviewed all laboratory values.  WBCs 16.9, secondary to vaso-occlusive crisis.  Patient is afebrile without any signs of infection or inflammation. Hemoglobin 10.5, above patient's baseline of 7-8 g/dL. Pain managed with Dilaudid 2 mg IV x3 Toradol 15 mg x 1 Tylenol 1000 mg x 1 IV fluids, 0.45% saline at 150 mL/h. Pain decreased throughout admission.  Patient rates pain as 2/10 is requesting discharge home.  He is alert, oriented, and ambulating without assistance.  Patient will discharge home in hemodynamically stable condition.  Discharge instructions: Resume all home medications.   Follow up with PCP as previously  scheduled.   Discussed the importance of drinking 64 ounces of water daily, dehydration of red blood cells may lead further sickling.   Avoid all stressors that precipitate sickle cell pain crisis.     The patient was given clear instructions to go to ER or return to medical center if symptoms do not improve, worsen or new problems develop.     Physical Exam at Discharge:  BP 121/63 (BP Location: Right Arm)   Pulse 87   Temp 99.1 F (37.3 C) (Oral)   Resp 16   SpO2 92%   Physical Exam Constitutional:      Appearance: Normal appearance. He is obese.  Eyes:  Pupils: Pupils are equal, round, and reactive to light.  Cardiovascular:     Rate and Rhythm: Normal rate and regular rhythm.     Pulses: Normal pulses.  Pulmonary:     Effort: Pulmonary effort is normal.  Abdominal:     General: Abdomen is flat. Bowel sounds are normal.  Musculoskeletal:        General: Normal range of motion.  Skin:    General: Skin is warm.  Neurological:     General: No focal deficit present.     Mental Status: He is alert. Mental status is at baseline.  Psychiatric:         Mood and Affect: Mood normal.        Thought Content: Thought content normal.        Judgment: Judgment normal.      Disposition at Discharge: Discharge disposition: 01-Home or Self Care       Discharge Orders: Discharge Instructions    Discharge patient   Complete by: As directed    Discharge disposition: 01-Home or Self Care   Discharge patient date: 01/08/2021      Condition at Discharge:   Stable  Time spent on Discharge:  Greater than 30 minutes.  Signed: Nolon Nations  APRN, MSN, FNP-C Patient Care [facility] [address] [place], [place] [address] [phone]  01/08/2021, 2:47 PM
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
## 2021-01-08 NOTE — Progress Notes (Signed)
Patient admitted to the day hospital for treatment of sickle cell pain crisis. Patient reported pain rated 8/10 in the back and head. Patient given IV Dilaudid pushes,  IV Toradol, PO Benadryl, PO Tylenol and hydrated with IV fluids. At discharge patient reported  pain at 2/10. Declined printed AVS. Alert, oriented and ambulatory at discharge.

## 2021-01-08 NOTE — Discharge Instructions (Signed)
Sickle Cell Anemia, Adult  Sickle cell anemia is a condition where your red blood cells are shaped like sickles. Red blood cells carry oxygen through the body. Sickle-shaped cells do not live as long as normal red blood cells. They also clump together and block blood from flowing through the blood vessels. This prevents the body from getting enough oxygen. Sickle cell anemia causes organ damage and pain. It also increases the risk of infection. Follow these instructions at home: Medicines  Take over-the-counter and prescription medicines only as told by your doctor.  If you were prescribed an antibiotic medicine, take it as told by your doctor. Do not stop taking the antibiotic even if you start to feel better.  If you develop a fever, do not take medicines to lower the fever right away. Tell your doctor about the fever. Managing pain, stiffness, and swelling  Try these methods to help with pain: ? Use a heating pad. ? Take a warm bath. ? Distract yourself, such as by watching TV. Eating and drinking  Drink enough fluid to keep your pee (urine) clear or pale yellow. Drink more in hot weather and during exercise.  Limit or avoid alcohol.  Eat a healthy diet. Eat plenty of fruits, vegetables, whole grains, and lean protein.  Take vitamins and supplements as told by your doctor. Traveling  When traveling, keep these with you: ? Your medical information. ? The names of your doctors. ? Your medicines.  If you need to take an airplane, talk to your doctor first. Activity  Rest often.  Avoid exercises that make your heart beat much faster, such as jogging. General instructions  Do not use products that have nicotine or tobacco, such as cigarettes and e-cigarettes. If you need help quitting, ask your doctor.  Consider wearing a medical alert bracelet.  Avoid being in high places (high altitudes), such as mountains.  Avoid very hot or cold temperatures.  Avoid places where the  temperature changes a lot.  Keep all follow-up visits as told by your doctor. This is important. Contact a doctor if:  A joint hurts.  Your feet or hands hurt or swell.  You feel tired (fatigued). Get help right away if:  You have symptoms of infection. These include: ? Fever. ? Chills. ? Being very tired. ? Irritability. ? Poor eating. ? Throwing up (vomiting).  You feel dizzy or faint.  You have new stomach pain, especially on the left side.  You have a an erection (priapism) that lasts more than 4 hours.  You have numbness in your arms or legs.  You have a hard time moving your arms or legs.  You have trouble talking.  You have pain that does not go away when you take medicine.  You are short of breath.  You are breathing fast.  You have a long-term cough.  You have pain in your chest.  You have a bad headache.  You have a stiff neck.  Your stomach looks bloated even though you did not eat much.  Your skin is pale.  You suddenly cannot see well. Summary  Sickle cell anemia is a condition where your red blood cells are shaped like sickles.  Follow your doctor's advice on ways to manage pain, food to eat, activities to do, and steps to take for safe travel.  Get medical help right away if you have any signs of infection, such as a fever. This information is not intended to replace advice given to you by   your health care provider. Make sure you discuss any questions you have with your health care provider. Document Revised: 04/06/2020 Document Reviewed: 04/06/2020 Elsevier Patient Education  2021 [employer]  

## 2021-01-08 NOTE — Progress Notes (Signed)
Patient came to the [facility] Care Center lobby for triage. Patient reports back and head pain rated 6/10. Reports taking Oxycodone 10 mg at 7:00 am. COVID-19 screening done and patient denies all symptoms and exposures. Denies fever, chest pain, nausea, vomiting, diarrhea, abdominal pain and priapism. Patient's mother will transport patient at discharge.  [place], FNP notified. Patient can come to the day hospital for pain management. Patient advised and expresses an understanding.

## 2021-01-09 ENCOUNTER — Emergency Department (HOSPITAL_COMMUNITY)
Admission: EM | Admit: 2021-01-09 | Discharge: 2021-01-09 | Disposition: A | Discharge status: Home / Self Care | Payer: Medicaid Other | Attending: Emergency Medicine | Admitting: Emergency Medicine

## 2021-01-09 ENCOUNTER — Telehealth (HOSPITAL_COMMUNITY): Payer: Self-pay | Admitting: General Practice

## 2021-01-09 ENCOUNTER — Encounter (HOSPITAL_COMMUNITY): Payer: Self-pay | Admitting: Emergency Medicine

## 2021-01-09 DIAGNOSIS — I10 Essential (primary) hypertension: Secondary | ICD-10-CM | POA: Insufficient documentation

## 2021-01-09 DIAGNOSIS — R519 Headache, unspecified: Secondary | ICD-10-CM | POA: Diagnosis present

## 2021-01-09 DIAGNOSIS — Z79899 Other long term (current) drug therapy: Secondary | ICD-10-CM | POA: Diagnosis not present

## 2021-01-09 DIAGNOSIS — J45909 Unspecified asthma, uncomplicated: Secondary | ICD-10-CM | POA: Diagnosis not present

## 2021-01-09 DIAGNOSIS — Z8616 Personal history of COVID-19: Secondary | ICD-10-CM | POA: Insufficient documentation

## 2021-01-09 DIAGNOSIS — D5700 Hb-SS disease with crisis, unspecified: Secondary | ICD-10-CM | POA: Insufficient documentation

## 2021-01-09 LAB — COMPREHENSIVE METABOLIC PANEL
ALT: 18 U/L (ref 0–44)
AST: 34 U/L (ref 15–41)
Albumin: 4 g/dL (ref 3.5–5.0)
Alkaline Phosphatase: 104 U/L (ref 38–126)
Anion gap: 10 (ref 5–15)
BUN: 7 mg/dL (ref 6–20)
CO2: 25 mmol/L (ref 22–32)
Calcium: 8.8 mg/dL — ABNORMAL LOW (ref 8.9–10.3)
Chloride: 105 mmol/L (ref 98–111)
Creatinine, Ser: 0.62 mg/dL (ref 0.61–1.24)
GFR, Estimated: >60 mL/min (ref >60)
Glucose, Bld: 109 mg/dL — ABNORMAL HIGH (ref 70–99)
Potassium: 3.5 mmol/L (ref 3.5–5.1)
Sodium: 140 mmol/L (ref 135–145)
Total Bilirubin: 5.6 mg/dL — ABNORMAL HIGH (ref 0.3–1.2)
Total Protein: 6.8 g/dL (ref 6.5–8.1)

## 2021-01-09 LAB — CBC WITH DIFFERENTIAL/PLATELET
Abs Immature Granulocytes: 0.06 10*3/uL (ref 0.00–0.07)
Basophils Absolute: 0.1 10*3/uL (ref 0.0–0.1)
Basophils Relative: 1 %
Eosinophils Absolute: 0.1 10*3/uL (ref 0.0–0.5)
Eosinophils Relative: 1 %
HCT: 27.1 % — ABNORMAL LOW (ref 39.0–52.0)
Hemoglobin: 9.7 g/dL — ABNORMAL LOW (ref 13.0–17.0)
Immature Granulocytes: 1 %
Lymphocytes Relative: 33 %
Lymphs Abs: 4 10*3/uL (ref 0.7–4.0)
MCH: 30.6 pg (ref 26.0–34.0)
MCHC: 35.8 g/dL (ref 30.0–36.0)
MCV: 85.5 fL (ref 80.0–100.0)
Monocytes Absolute: 1.5 10*3/uL — ABNORMAL HIGH (ref 0.1–1.0)
Monocytes Relative: 12 %
Neutro Abs: 6.4 10*3/uL (ref 1.7–7.7)
Neutrophils Relative %: 52 %
Platelets: 365 10*3/uL (ref 150–400)
RBC: 3.17 MIL/uL — ABNORMAL LOW (ref 4.22–5.81)
RDW: 20.5 % — ABNORMAL HIGH (ref 11.5–15.5)
WBC: 12.3 10*3/uL — ABNORMAL HIGH (ref 4.0–10.5)
nRBC: 2.6 % — ABNORMAL HIGH (ref 0.0–0.2)

## 2021-01-09 LAB — RETICULOCYTES
Immature Retic Fract: 46.9 % — ABNORMAL HIGH (ref 2.3–15.9)
RBC.: 3.17 MIL/uL — ABNORMAL LOW (ref 4.22–5.81)
Retic Count, Absolute: 216.5 10*3/uL — ABNORMAL HIGH (ref 19.0–186.0)
Retic Ct Pct: 7 % — ABNORMAL HIGH (ref 0.4–3.1)

## 2021-01-09 MED ORDER — OXYCODONE HCL 5 MG PO TABS
10.0000 mg | ORAL_TABLET | Freq: Once | ORAL | Status: AC
  Administered 2021-01-09: 10 mg via ORAL
  Filled 2021-01-09: qty 2

## 2021-01-09 MED ORDER — HYDROMORPHONE HCL 2 MG/ML IJ SOLN
2.0000 mg | INTRAMUSCULAR | Status: AC
  Administered 2021-01-09: 2 mg via INTRAVENOUS
  Filled 2021-01-09: qty 1

## 2021-01-09 MED ORDER — SODIUM CHLORIDE 0.45 % IV SOLN: INTRAVENOUS | Status: DC

## 2021-01-09 MED ORDER — ONDANSETRON HCL 4 MG/2ML IJ SOLN: 4.0000 mg | INTRAMUSCULAR | Status: DC | PRN

## 2021-01-09 MED ORDER — DIPHENHYDRAMINE HCL 50 MG/ML IJ SOLN
25.0000 mg | Freq: Once | INTRAMUSCULAR | Status: AC
  Administered 2021-01-09: 25 mg via INTRAVENOUS
  Filled 2021-01-09: qty 1

## 2021-01-09 NOTE — Discharge Instructions (Addendum)
Get help right away if: You have symptoms of infection. These include: Fever. Chills. Extreme tiredness. Irritability. Poor eating. Vomiting. You feel dizzy or faint. You have new abdominal pain, especially on the left side near the stomach area. You develop priapism. You have numbness in your arms or legs or have trouble moving them. You have trouble talking. You develop pain that cannot be controlled with medicine. You become short of breath. You have rapid breathing. You have a persistent cough. You have pain in your chest. You develop a severe headache or stiff neck. You feel bloated without eating or after eating a small amount of food. Your skin is pale. You suddenly lose vision. 
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
## 2021-01-09 NOTE — Telephone Encounter (Signed)
Patient came to the [facility] lobby for triage. Per provider, the [facility] does not have the capacity to treat the patient today. Patient told to always call for triage before coming, patient can  hydrate with 64 ounces of water, take pain medications as prescribed. But if the pain is unbearable today, can go to the ER. Patient verbalized understanding.

## 2021-01-09 NOTE — ED Triage Notes (Signed)
Per pt, states he might be having a sickle cell crisis-states he has a headache and back pain-was seen at the clinic yesterday and felt fine when he went home-states pain started last night

## 2021-01-10 ENCOUNTER — Telehealth: Payer: Self-pay

## 2021-01-10 NOTE — Telephone Encounter (Signed)
Transition Care Management Follow-up Telephone Call  Date of discharge and from where: 01/09/2021 from [facility]  How have you been since you were released from the hospital? Pt states that he is feeling much better and is calling PCP today to schedule a follow up appt with her.   Any questions or concerns? No  Items Reviewed:  Did the pt receive and understand the discharge instructions provided? Yes   Medications obtained and verified? Yes   Other? No   Any new allergies since your discharge? No   Dietary orders reviewed? N/A  Do you have support at home? Yes   Functional Questionnaire: (I = Independent and D = Dependent) ADLs: I  Bathing/Dressing- I  Meal Prep- I  Eating- I  Maintaining continence- I  Transferring/Ambulation- I  Managing Meds- I   Follow up appointments reviewed:   PCP Hospital f/u appt confirmed? No  Pt will call to schedule appt with PCP.   Are transportation arrangements needed? No  If their condition worsens, is the pt aware to call PCP or go to the Emergency Dept.? Yes Was the patient provided with contact information for the PCP's office or ED? Yes Was to pt encouraged to call back with questions or concerns? Yes

## 2021-01-17 ENCOUNTER — Telehealth (HOSPITAL_COMMUNITY): Payer: Self-pay | Admitting: General Practice

## 2021-01-17 ENCOUNTER — Inpatient Hospital Stay (HOSPITAL_COMMUNITY)
Admission: AD | Admit: 2021-01-17 | Discharge: 2021-01-22 | DRG: 812 | Disposition: A | Discharge status: Home / Self Care | Payer: Medicaid Other | Source: Ambulatory Visit | Attending: Internal Medicine | Admitting: Internal Medicine

## 2021-01-17 ENCOUNTER — Other Ambulatory Visit: Payer: Self-pay

## 2021-01-17 ENCOUNTER — Encounter (HOSPITAL_COMMUNITY): Payer: Self-pay | Admitting: Family Medicine

## 2021-01-17 DIAGNOSIS — N08 Glomerular disorders in diseases classified elsewhere: Secondary | ICD-10-CM | POA: Diagnosis present

## 2021-01-17 DIAGNOSIS — Z8249 Family history of ischemic heart disease and other diseases of the circulatory system: Secondary | ICD-10-CM | POA: Diagnosis not present

## 2021-01-17 DIAGNOSIS — D72829 Elevated white blood cell count, unspecified: Secondary | ICD-10-CM | POA: Diagnosis present

## 2021-01-17 DIAGNOSIS — Z79899 Other long term (current) drug therapy: Secondary | ICD-10-CM

## 2021-01-17 DIAGNOSIS — Z20822 Contact with and (suspected) exposure to covid-19: Secondary | ICD-10-CM | POA: Diagnosis present

## 2021-01-17 DIAGNOSIS — I10 Essential (primary) hypertension: Secondary | ICD-10-CM | POA: Diagnosis present

## 2021-01-17 DIAGNOSIS — Z888 Allergy status to other drugs, medicaments and biological substances status: Secondary | ICD-10-CM

## 2021-01-17 DIAGNOSIS — D638 Anemia in other chronic diseases classified elsewhere: Secondary | ICD-10-CM | POA: Diagnosis present

## 2021-01-17 DIAGNOSIS — G894 Chronic pain syndrome: Secondary | ICD-10-CM | POA: Diagnosis present

## 2021-01-17 DIAGNOSIS — R0789 Other chest pain: Secondary | ICD-10-CM | POA: Diagnosis present

## 2021-01-17 DIAGNOSIS — D5700 Hb-SS disease with crisis, unspecified: Secondary | ICD-10-CM | POA: Diagnosis not present

## 2021-01-17 DIAGNOSIS — R5081 Fever presenting with conditions classified elsewhere: Secondary | ICD-10-CM | POA: Diagnosis not present

## 2021-01-17 DIAGNOSIS — Z832 Family history of diseases of the blood and blood-forming organs and certain disorders involving the immune mechanism: Secondary | ICD-10-CM | POA: Diagnosis not present

## 2021-01-17 DIAGNOSIS — Z8616 Personal history of COVID-19: Secondary | ICD-10-CM | POA: Diagnosis not present

## 2021-01-17 LAB — CBC WITH DIFFERENTIAL/PLATELET
Abs Immature Granulocytes: 0.45 10*3/uL — ABNORMAL HIGH (ref 0.00–0.07)
Basophils Absolute: 0.2 10*3/uL — ABNORMAL HIGH (ref 0.0–0.1)
Basophils Relative: 1 %
Eosinophils Absolute: 0.1 10*3/uL (ref 0.0–0.5)
Eosinophils Relative: 1 %
HCT: 24.8 % — ABNORMAL LOW (ref 39.0–52.0)
Hemoglobin: 8.9 g/dL — ABNORMAL LOW (ref 13.0–17.0)
Immature Granulocytes: 4 %
Lymphocytes Relative: 39 %
Lymphs Abs: 5 10*3/uL — ABNORMAL HIGH (ref 0.7–4.0)
MCH: 30.4 pg (ref 26.0–34.0)
MCHC: 35.9 g/dL (ref 30.0–36.0)
MCV: 84.6 fL (ref 80.0–100.0)
Monocytes Absolute: 1.4 10*3/uL — ABNORMAL HIGH (ref 0.1–1.0)
Monocytes Relative: 11 %
Neutro Abs: 5.8 10*3/uL (ref 1.7–7.7)
Neutrophils Relative %: 44 %
Platelets: 384 10*3/uL (ref 150–400)
RBC: 2.93 MIL/uL — ABNORMAL LOW (ref 4.22–5.81)
RDW: 19.1 % — ABNORMAL HIGH (ref 11.5–15.5)
WBC: 12.9 10*3/uL — ABNORMAL HIGH (ref 4.0–10.5)
nRBC: 0.9 % — ABNORMAL HIGH (ref 0.0–0.2)

## 2021-01-17 LAB — COMPREHENSIVE METABOLIC PANEL
ALT: 19 U/L (ref 0–44)
AST: 38 U/L (ref 15–41)
Albumin: 4.2 g/dL (ref 3.5–5.0)
Alkaline Phosphatase: 96 U/L (ref 38–126)
Anion gap: 9 (ref 5–15)
BUN: 7 mg/dL (ref 6–20)
CO2: 26 mmol/L (ref 22–32)
Calcium: 8.9 mg/dL (ref 8.9–10.3)
Chloride: 106 mmol/L (ref 98–111)
Creatinine, Ser: 0.64 mg/dL (ref 0.61–1.24)
GFR, Estimated: >60 mL/min (ref >60)
Glucose, Bld: 105 mg/dL — ABNORMAL HIGH (ref 70–99)
Potassium: 3.8 mmol/L (ref 3.5–5.1)
Sodium: 141 mmol/L (ref 135–145)
Total Bilirubin: 3.7 mg/dL — ABNORMAL HIGH (ref 0.3–1.2)
Total Protein: 7 g/dL (ref 6.5–8.1)

## 2021-01-17 LAB — RETICULOCYTES
Immature Retic Fract: 40.8 % — ABNORMAL HIGH (ref 2.3–15.9)
RBC.: 2.91 MIL/uL — ABNORMAL LOW (ref 4.22–5.81)
Retic Count, Absolute: 172 10*3/uL (ref 19.0–186.0)
Retic Ct Pct: 5.9 % — ABNORMAL HIGH (ref 0.4–3.1)

## 2021-01-17 MED ORDER — SODIUM CHLORIDE 0.45 % IV SOLN: INTRAVENOUS | Status: AC

## 2021-01-17 MED ORDER — ACETAMINOPHEN 500 MG PO TABS
1000.0000 mg | ORAL_TABLET | Freq: Once | ORAL | Status: AC
  Administered 2021-01-17: 1000 mg via ORAL
  Filled 2021-01-17: qty 2

## 2021-01-17 MED ORDER — SENNOSIDES-DOCUSATE SODIUM 8.6-50 MG PO TABS
1.0000 | ORAL_TABLET | Freq: Two times a day (BID) | ORAL | Status: DC
Start: 2021-01-17 — End: 2021-01-22
  Administered 2021-01-17 – 2021-01-22 (×9): 1 via ORAL
  Filled 2021-01-17 (×10): qty 1

## 2021-01-17 MED ORDER — LOSARTAN POTASSIUM 50 MG PO TABS
50.0000 mg | ORAL_TABLET | Freq: Every day | ORAL | Status: DC
  Administered 2021-01-18 – 2021-01-22 (×5): 50 mg via ORAL
  Filled 2021-01-17 (×5): qty 1

## 2021-01-17 MED ORDER — KETOROLAC TROMETHAMINE 30 MG/ML IJ SOLN
15.0000 mg | Freq: Once | INTRAMUSCULAR | Status: AC
  Administered 2021-01-17: 15 mg via INTRAVENOUS
  Filled 2021-01-17: qty 1

## 2021-01-17 MED ORDER — HYDROXYUREA 500 MG PO CAPS
1500.0000 mg | ORAL_CAPSULE | Freq: Every day | ORAL | Status: DC
  Administered 2021-01-18 – 2021-01-22 (×5): 1500 mg via ORAL
  Filled 2021-01-17 (×5): qty 3

## 2021-01-17 MED ORDER — DIPHENHYDRAMINE HCL 25 MG PO CAPS
25.0000 mg | ORAL_CAPSULE | ORAL | Status: DC | PRN
  Administered 2021-01-17: 25 mg via ORAL
  Filled 2021-01-17: qty 1

## 2021-01-17 MED ORDER — SODIUM CHLORIDE 0.45 % IV SOLN: INTRAVENOUS | Status: DC

## 2021-01-17 MED ORDER — KETOROLAC TROMETHAMINE 15 MG/ML IJ SOLN
15.0000 mg | Freq: Four times a day (QID) | INTRAMUSCULAR | Status: AC
Start: 2021-01-17 — End: 2021-01-22
  Administered 2021-01-17 – 2021-01-22 (×20): 15 mg via INTRAVENOUS
  Filled 2021-01-17 (×19): qty 1

## 2021-01-17 MED ORDER — HYDROMORPHONE 1 MG/ML IV SOLN
INTRAVENOUS | Status: DC
Start: 2021-01-17 — End: 2021-01-22
  Administered 2021-01-17: 3 mg via INTRAVENOUS
  Administered 2021-01-17: 30 mg via INTRAVENOUS
  Administered 2021-01-17: 6.6 mg via INTRAVENOUS
  Administered 2021-01-18: 2.4 mg via INTRAVENOUS
  Administered 2021-01-18: 4 mg via INTRAVENOUS
  Administered 2021-01-18: 1.8 mg via INTRAVENOUS
  Administered 2021-01-18: 2.4 mg via INTRAVENOUS
  Administered 2021-01-18: 5 mg via INTRAVENOUS
  Administered 2021-01-18: 0.6 mg via INTRAVENOUS
  Administered 2021-01-19 (×2): 1.8 mg via INTRAVENOUS
  Administered 2021-01-19: 30 mg via INTRAVENOUS
  Administered 2021-01-19 (×2): 1.2 mg via INTRAVENOUS
  Administered 2021-01-19: 3.6 mg via INTRAVENOUS
  Administered 2021-01-20: 3 mg via INTRAVENOUS
  Administered 2021-01-20: 1.2 mg via INTRAVENOUS
  Administered 2021-01-20: 4.2 mg via INTRAVENOUS
  Administered 2021-01-20: 1.2 mg via INTRAVENOUS
  Administered 2021-01-20: 1.8 mg via INTRAVENOUS
  Administered 2021-01-20 – 2021-01-21 (×2): 3.6 mg via INTRAVENOUS
  Administered 2021-01-21: 1.8 mg via INTRAVENOUS
  Administered 2021-01-21: 30 mg via INTRAVENOUS
  Administered 2021-01-21 (×2): 3 mg via INTRAVENOUS
  Administered 2021-01-21: 0 mg via INTRAVENOUS
  Administered 2021-01-22: 5.4 mg via INTRAVENOUS
  Administered 2021-01-22: 1.8 mg via INTRAVENOUS
  Administered 2021-01-22: 1.6 mg via INTRAVENOUS
  Administered 2021-01-22: 2.4 mg via INTRAVENOUS
  Filled 2021-01-17 (×3): qty 30

## 2021-01-17 MED ORDER — NALOXONE HCL 0.4 MG/ML IJ SOLN: 0.4000 mg | INTRAMUSCULAR | Status: DC | PRN

## 2021-01-17 MED ORDER — FOLIC ACID 1 MG PO TABS
1.0000 mg | ORAL_TABLET | Freq: Every day | ORAL | Status: DC
  Administered 2021-01-18 – 2021-01-22 (×5): 1 mg via ORAL
  Filled 2021-01-17 (×5): qty 1

## 2021-01-17 MED ORDER — ONDANSETRON HCL 4 MG/2ML IJ SOLN: 4.0000 mg | Freq: Four times a day (QID) | INTRAMUSCULAR | Status: DC | PRN

## 2021-01-17 MED ORDER — POLYETHYLENE GLYCOL 3350 17 G PO PACK: 17.0000 g | PACK | Freq: Every day | ORAL | Status: DC | PRN

## 2021-01-17 MED ORDER — ENOXAPARIN SODIUM 40 MG/0.4ML ~~LOC~~ SOLN
40.0000 mg | SUBCUTANEOUS | Status: DC
  Administered 2021-01-17: 40 mg via SUBCUTANEOUS
  Filled 2021-01-17 (×5): qty 0.4

## 2021-01-17 MED ORDER — SODIUM CHLORIDE 0.9% FLUSH: 9.0000 mL | INTRAVENOUS | Status: DC | PRN

## 2021-01-17 MED ORDER — OXYCODONE HCL 5 MG PO TABS
10.0000 mg | ORAL_TABLET | ORAL | Status: DC | PRN
  Administered 2021-01-17 – 2021-01-18 (×4): 10 mg via ORAL
  Filled 2021-01-17 (×4): qty 2
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
## 2021-01-17 NOTE — Telephone Encounter (Signed)
Patient called, requesting to come to the day hospital due to pain in the chest "typical" and back rated at 10/10. Denied  fever, diarrhea, abdominal pain, nausea/vomitting and priapism. Screened negative for Covid-19 symptoms. Admitted to having means of transportation without driving self after treatment. Last took 10 mg of oxycodone at about 7:15 am. Per provider, patient can come to the day hospital for treatment. Patient notified, verbalized understanding.

## 2021-01-17 NOTE — Progress Notes (Signed)
Pt admitted to the day hospital for treatment of sickle cell pain crisis. Pt reported pain to chest and back rated a 8/10. Pt given PO Benadryl and Tylenol,placed on Dilaudid PCA, and hydrated with IV fluids. Pt reports pain is still a 6/10 and not controlled in his back, [place] FNP made aware, orders given for admission. Pt admitted to hospital. Report called to [facility].Pt  transported to 6E Bed 10  in wheelchair by staff, vitals signs and pt stable at transfer.

## 2021-01-18 ENCOUNTER — Inpatient Hospital Stay (HOSPITAL_COMMUNITY): Payer: Medicaid Other

## 2021-01-18 DIAGNOSIS — D5700 Hb-SS disease with crisis, unspecified: Secondary | ICD-10-CM | POA: Diagnosis not present

## 2021-01-18 LAB — CBC
HCT: 25.7 % — ABNORMAL LOW (ref 39.0–52.0)
Hemoglobin: 9.3 g/dL — ABNORMAL LOW (ref 13.0–17.0)
MCH: 30.2 pg (ref 26.0–34.0)
MCHC: 36.2 g/dL — ABNORMAL HIGH (ref 30.0–36.0)
MCV: 83.4 fL (ref 80.0–100.0)
Platelets: 368 10*3/uL (ref 150–400)
RBC: 3.08 MIL/uL — ABNORMAL LOW (ref 4.22–5.81)
RDW: 20.4 % — ABNORMAL HIGH (ref 11.5–15.5)
WBC: 13.9 10*3/uL — ABNORMAL HIGH (ref 4.0–10.5)
nRBC: 2.5 % — ABNORMAL HIGH (ref 0.0–0.2)

## 2021-01-18 LAB — TYPE AND SCREEN
ABO/RH(D): O POS
Antibody Screen: NEGATIVE

## 2021-01-18 LAB — BASIC METABOLIC PANEL
Anion gap: 8 (ref 5–15)
BUN: 5 mg/dL — ABNORMAL LOW (ref 6–20)
CO2: 24 mmol/L (ref 22–32)
Calcium: 8.6 mg/dL — ABNORMAL LOW (ref 8.9–10.3)
Chloride: 104 mmol/L (ref 98–111)
Creatinine, Ser: 0.54 mg/dL — ABNORMAL LOW (ref 0.61–1.24)
GFR, Estimated: >60 mL/min (ref >60)
Glucose, Bld: 145 mg/dL — ABNORMAL HIGH (ref 70–99)
Potassium: 3.7 mmol/L (ref 3.5–5.1)
Sodium: 136 mmol/L (ref 135–145)

## 2021-01-18 LAB — SARS CORONAVIRUS 2 (TAT 6-24 HRS): SARS Coronavirus 2: NEGATIVE

## 2021-01-18 LAB — LACTATE DEHYDROGENASE: LDH: 577 U/L — ABNORMAL HIGH (ref 98–192)

## 2021-01-18 LAB — TROPONIN I (HIGH SENSITIVITY)
Troponin I (High Sensitivity): 7 ng/L (ref <18)
Troponin I (High Sensitivity): 9 ng/L (ref <18)

## 2021-01-18 MED ORDER — HYDROMORPHONE HCL 1 MG/ML IJ SOLN
1.0000 mg | Freq: Once | INTRAMUSCULAR | Status: AC
  Administered 2021-01-18: 1 mg via INTRAVENOUS
  Filled 2021-01-18: qty 1

## 2021-01-18 NOTE — Progress Notes (Signed)
Subjective: [name] Hill is a 20 year old male with a medical history significant for sickle cell disease, chronic pain syndrome, opiate dependence and tolerance, and history of anemia of chronic disease was admitted for sickle cell pain crisis.  Patient is complaining of worsening chest pain over the past few hours. Pain is primarily sub sternal. He is also complaining of pain to low back and lower extremities that is consistent with his typical sickle cell crisis.  He denies dizziness, shortness of breath, urinary symptoms, nausea, vomiting, or diarrhea.  Objective:  Vital signs in last 24 hours:  Vitals:   01/18/21 0752 01/18/21 1127 01/18/21 1308 01/18/21 1340  BP:  135/76  137/86  Pulse:  69  70  Resp: 18 18 18 17   Temp:  98.1 F (36.7 C)  98.2 F (36.8 C)  TempSrc:  Oral  Oral  SpO2: 96% 95% 95% 97%  Weight:        Intake/Output from previous day:   Intake/Output Summary (Last 24 hours) at 01/18/2021 1519 Last data filed at 01/17/2021 1644 Gross per 24 hour  Intake 825.95 ml  Output --  Net 825.95 ml    Physical Exam: General: Alert, awake, oriented x3, in no acute distress.  HEENT: [place]/AT PEERL, EOMI Neck: Trachea midline,  no masses, no thyromegal,y no JVD, no carotid bruit OROPHARYNX:  Moist, No exudate/ erythema/lesions.  Heart: Regular rate and rhythm, without murmurs, rubs, gallops, PMI non-displaced, no heaves or thrills on palpation.  Lungs: Clear to auscultation, no wheezing or rhonchi noted. No increased vocal fremitus resonant to percussion  Abdomen: Soft, nontender, nondistended, positive bowel sounds, no masses no hepatosplenomegaly noted..  Neuro: No focal neurological deficits noted cranial nerves II through XII grossly intact. DTRs 2+ bilaterally upper and lower extremities. Strength 5 out of 5 in bilateral upper and lower extremities. Musculoskeletal: No warm swelling or erythema around joints, no spinal tenderness noted. Psychiatric: Patient alert  and oriented x3, good insight and cognition, good recent to remote recall. Lymph node survey: No cervical axillary or inguinal lymphadenopathy noted.  Lab Results:  Basic Metabolic Panel:    Component Value Date/Time   NA 136 01/18/2021 1207   NA 142 06/26/2018 1416   K 3.7 01/18/2021 1207   CL 104 01/18/2021 1207   CO2 24 01/18/2021 1207   BUN 5 (L) 01/18/2021 1207   BUN 7 06/26/2018 1416   CREATININE 0.54 (L) 01/18/2021 1207   GLUCOSE 145 (H) 01/18/2021 1207   CALCIUM 8.6 (L) 01/18/2021 1207   CBC:    Component Value Date/Time   WBC 13.9 (H) 01/18/2021 0506   HGB 9.3 (L) 01/18/2021 0506   HGB 9.6 (L) 06/26/2018 1416   HCT 25.7 (L) 01/18/2021 0506   HCT 28.5 (L) 06/26/2018 1416   PLT 368 01/18/2021 0506   PLT 405 06/26/2018 1416   MCV 83.4 01/18/2021 0506   MCV 92 06/26/2018 1416   NEUTROABS 5.8 01/17/2021 1120   NEUTROABS 3.7 06/26/2018 1416   LYMPHSABS 5.0 (H) 01/17/2021 1120   LYMPHSABS 3.3 (H) 06/26/2018 1416   MONOABS 1.4 (H) 01/17/2021 1120   EOSABS 0.1 01/17/2021 1120   EOSABS 0.2 06/26/2018 1416   BASOSABS 0.2 (H) 01/17/2021 1120   BASOSABS 0.1 06/26/2018 1416    Recent Results (from the past 240 hour(s))  SARS CORONAVIRUS 2 (TAT 6-24 HRS) Nasopharyngeal Nasopharyngeal Swab     Status: None   Collection Time: 01/17/21  7:03 PM   Specimen: Nasopharyngeal Swab  Result Value Ref  Range Status   SARS Coronavirus 2 NEGATIVE NEGATIVE Final    Comment: (NOTE) SARS-CoV-2 target nucleic acids are NOT DETECTED.  The SARS-CoV-2 RNA is generally detectable in upper and lower respiratory specimens during the acute phase of infection. Negative results do not preclude SARS-CoV-2 infection, do not rule out co-infections with other pathogens, and should not be used as the sole basis for treatment or other patient management decisions. Negative results must be combined with clinical observations, patient history, and epidemiological information. The expected result is  Negative.  Fact Sheet for Patients: HairSlick.no  Fact Sheet for Healthcare Providers: quierodirigir.com  This test is not yet approved or cleared by the [place] FDA and  has been authorized for detection and/or diagnosis of SARS-CoV-2 by FDA under an Emergency Use Authorization (EUA). This EUA will remain  in effect (meaning this test can be used) for the duration of the COVID-19 declaration under Se ction 564(b)(1) of the Act, 21 U.S.C. section 360bbb-3(b)(1), unless the authorization is terminated or revoked sooner.  Performed at [facility], [address], [place], [place] [address]     Studies/Results: DG Chest 2 View  Result Date: 01/18/2021 CLINICAL DATA:  20 y.o male. Sickle cell crisis EXAM: CHEST - 2 VIEW COMPARISON:  Chest radiograph 10/01/2020 FINDINGS: Stable cardiomediastinal contours with enlarged heart size. Low lung volumes. No focal consolidation. No pneumothorax or pleural effusion. No acute finding in the visualized skeleton. IMPRESSION: No acute cardiopulmonary process. Electronically Signed   By: Emmaline Kluver M.D.   On: 01/18/2021 14:25    Medications: Scheduled Meds: . enoxaparin (LOVENOX) injection  40 mg Subcutaneous Q24H  . folic acid  1 mg Oral Daily  . HYDROmorphone   Intravenous Q4H  . hydroxyurea  1,500 mg Oral Daily  . ketorolac  15 mg Intravenous Q6H  . losartan  50 mg Oral Daily  . senna-docusate  1 tablet Oral BID   Continuous Infusions: PRN Meds:.diphenhydrAMINE, naloxone **AND** sodium chloride flush, ondansetron (ZOFRAN) IV, oxyCODONE, polyethylene glycol  Consultants:  None  Procedures:  None  Antibiotics:  None  Assessment/Plan: Principal Problem:   Sickle cell pain crisis (HCC) Active Problems:   Leukocytosis   Hypertension   Chronic pain syndrome  Sickle cell disease with pain crisis: Continue IV fluids at [facility] Continue IV Dilaudid PCA without  any changes Toradol 15 mg IV every 6 hours for total of 5 days Oxycodone 10 mg every 4 hours as needed for severe breakthrough pain Monitor vital signs very closely, reevaluate pain scale regularly, and supplemental regimen as needed. Chronic pain syndrome: Continue home medications  Mild leukocytosis: Patient continues to be afebrile without any signs of infection or inflammation.  More than likely secondary to sickle cell pain crisis.  Continue to follow closely.  CBC in AM.  Sickle cell anemia: Hemoglobin continues to be stable and consistent with patient's baseline.  There is no clinical indication for blood transfusion at this time.  Continue to follow closely.  Follow labs in AM.  Atypical chest pain: Chest x-ray shows no acute cardiopulmonary process. EKG unremarkable. High sensitivity troponin negative. Initiate cardiac monitoring.   Code Status: Full Code Family Communication: N/A Disposition Plan: Not yet ready for discharge  Merie Wulf Rennis Petty  APRN, MSN, FNP-C Patient Care Center [facility] [address] [place], [place] [address] [phone]   If 5PM-8AM, please contact night-coverage.  01/18/2021, 3:19 PM  LOS: 1 day

## 2021-01-19 DIAGNOSIS — D5700 Hb-SS disease with crisis, unspecified: Secondary | ICD-10-CM | POA: Diagnosis not present

## 2021-01-19 LAB — CBC
HCT: 24.4 % — ABNORMAL LOW (ref 39.0–52.0)
Hemoglobin: 8.8 g/dL — ABNORMAL LOW (ref 13.0–17.0)
MCH: 30.2 pg (ref 26.0–34.0)
MCHC: 36.1 g/dL — ABNORMAL HIGH (ref 30.0–36.0)
MCV: 83.8 fL (ref 80.0–100.0)
Platelets: 353 10*3/uL (ref 150–400)
RBC: 2.91 MIL/uL — ABNORMAL LOW (ref 4.22–5.81)
RDW: 19.8 % — ABNORMAL HIGH (ref 11.5–15.5)
WBC: 13.1 10*3/uL — ABNORMAL HIGH (ref 4.0–10.5)
nRBC: 3.5 % — ABNORMAL HIGH (ref 0.0–0.2)

## 2021-01-19 MED ORDER — ACETAMINOPHEN 325 MG PO TABS
650.0000 mg | ORAL_TABLET | ORAL | Status: DC | PRN
  Administered 2021-01-19 – 2021-01-20 (×2): 650 mg via ORAL
  Filled 2021-01-19 (×2): qty 2

## 2021-01-19 NOTE — Progress Notes (Signed)
Subjective: [name] Hill is a 20 year old male with a medical history significant for sickle cell disease, chronic pain syndrome, opiate dependence and tolerance, and history of anemia of chronic disease was admitted for sickle cell pain crisis.  Patient says that pain has improved some overnight.  Pain intensity is 5/10 primarily to low back and lower extremities.  Patient says that he is not ready for discharge at this time and is requesting 1 more day.  He denies any headache, chest pain, shortness of breath, urinary symptoms, nausea, vomiting, or diarrhea.  Objective:  Vital signs in last 24 hours:  Vitals:   01/19/21 0553 01/19/21 0736 01/19/21 0922 01/19/21 1100  BP: 125/65  130/64   Pulse: 84  89   Resp: 20 (!) 21 16   Temp: 99.5 F (37.5 C)  (!) 100.6 F (38.1 C) 100.1 F (37.8 C)  TempSrc: Oral  Oral   SpO2: 92%  93%   Weight: 89.7 kg       Intake/Output from previous day:   Intake/Output Summary (Last 24 hours) at 01/19/2021 1139 Last data filed at 01/19/2021 0300 Gross per 24 hour  Intake -  Output 1425 ml  Net -1425 ml    Physical Exam: General: Alert, awake, oriented x3, in no acute distress.  HEENT: [place]/AT PEERL, EOMI Neck: Trachea midline,  no masses, no thyromegal,y no JVD, no carotid bruit OROPHARYNX:  Moist, No exudate/ erythema/lesions.  Heart: Regular rate and rhythm, without murmurs, rubs, gallops, PMI non-displaced, no heaves or thrills on palpation.  Lungs: Clear to auscultation, no wheezing or rhonchi noted. No increased vocal fremitus resonant to percussion  Abdomen: Soft, nontender, nondistended, positive bowel sounds, no masses no hepatosplenomegaly noted..  Neuro: No focal neurological deficits noted cranial nerves II through XII grossly intact. DTRs 2+ bilaterally upper and lower extremities. Strength 5 out of 5 in bilateral upper and lower extremities. Musculoskeletal: No warm swelling or erythema around joints, no spinal tenderness  noted. Psychiatric: Patient alert and oriented x3, good insight and cognition, good recent to remote recall. Lymph node survey: No cervical axillary or inguinal lymphadenopathy noted.  Lab Results:  Basic Metabolic Panel:    Component Value Date/Time   NA 136 01/18/2021 1207   NA 142 06/26/2018 1416   K 3.7 01/18/2021 1207   CL 104 01/18/2021 1207   CO2 24 01/18/2021 1207   BUN 5 (L) 01/18/2021 1207   BUN 7 06/26/2018 1416   CREATININE 0.54 (L) 01/18/2021 1207   GLUCOSE 145 (H) 01/18/2021 1207   CALCIUM 8.6 (L) 01/18/2021 1207   CBC:    Component Value Date/Time   WBC 13.1 (H) 01/19/2021 0508   HGB 8.8 (L) 01/19/2021 0508   HGB 9.6 (L) 06/26/2018 1416   HCT 24.4 (L) 01/19/2021 0508   HCT 28.5 (L) 06/26/2018 1416   PLT 353 01/19/2021 0508   PLT 405 06/26/2018 1416   MCV 83.8 01/19/2021 0508   MCV 92 06/26/2018 1416   NEUTROABS 5.8 01/17/2021 1120   NEUTROABS 3.7 06/26/2018 1416   LYMPHSABS 5.0 (H) 01/17/2021 1120   LYMPHSABS 3.3 (H) 06/26/2018 1416   MONOABS 1.4 (H) 01/17/2021 1120   EOSABS 0.1 01/17/2021 1120   EOSABS 0.2 06/26/2018 1416   BASOSABS 0.2 (H) 01/17/2021 1120   BASOSABS 0.1 06/26/2018 1416    Recent Results (from the past 240 hour(s))  SARS CORONAVIRUS 2 (TAT 6-24 HRS) Nasopharyngeal Nasopharyngeal Swab     Status: None   Collection Time: 01/17/21  7:03  PM   Specimen: Nasopharyngeal Swab  Result Value Ref Range Status   SARS Coronavirus 2 NEGATIVE NEGATIVE Final    Comment: (NOTE) SARS-CoV-2 target nucleic acids are NOT DETECTED.  The SARS-CoV-2 RNA is generally detectable in upper and lower respiratory specimens during the acute phase of infection. Negative results do not preclude SARS-CoV-2 infection, do not rule out co-infections with other pathogens, and should not be used as the sole basis for treatment or other patient management decisions. Negative results must be combined with clinical observations, patient history, and epidemiological  information. The expected result is Negative.  Fact Sheet for Patients: HairSlick.no  Fact Sheet for Healthcare Providers: quierodirigir.com  This test is not yet approved or cleared by the [place] FDA and  has been authorized for detection and/or diagnosis of SARS-CoV-2 by FDA under an Emergency Use Authorization (EUA). This EUA will remain  in effect (meaning this test can be used) for the duration of the COVID-19 declaration under Se ction 564(b)(1) of the Act, 21 U.S.C. section 360bbb-3(b)(1), unless the authorization is terminated or revoked sooner.  Performed at [facility], [address], [place], [place] [address]     Studies/Results: DG Chest 2 View  Result Date: 01/18/2021 CLINICAL DATA:  20 y.o male. Sickle cell crisis EXAM: CHEST - 2 VIEW COMPARISON:  Chest radiograph 10/01/2020 FINDINGS: Stable cardiomediastinal contours with enlarged heart size. Low lung volumes. No focal consolidation. No pneumothorax or pleural effusion. No acute finding in the visualized skeleton. IMPRESSION: No acute cardiopulmonary process. Electronically Signed   By: Emmaline Kluver M.D.   On: 01/18/2021 14:25    Medications: Scheduled Meds: . enoxaparin (LOVENOX) injection  40 mg Subcutaneous Q24H  . folic acid  1 mg Oral Daily  . HYDROmorphone   Intravenous Q4H  . hydroxyurea  1,500 mg Oral Daily  . ketorolac  15 mg Intravenous Q6H  . losartan  50 mg Oral Daily  . senna-docusate  1 tablet Oral BID   Continuous Infusions: PRN Meds:.acetaminophen, diphenhydrAMINE, naloxone **AND** sodium chloride flush, ondansetron (ZOFRAN) IV, oxyCODONE, polyethylene glycol  Consultants:  None  Procedures:  None  Antibiotics:  None  Assessment/Plan: Principal Problem:   Sickle cell pain crisis (HCC) Active Problems:   Leukocytosis   Hypertension   Chronic pain syndrome  Sickle cell disease with pain crisis: Continue  IV fluids at [facility] IV Dilaudid PCA Toradol 15 mg IV every 6 hours for total of 5 days Continue oxycodone 10 mg every 4 hours as needed for severe breakthrough pain Monitor vital signs very closely, reevaluate pain scale regularly, and supplemental oxygen as needed.  Chronic pain syndrome: Continue home medications  Mild leukocytosis: Stable.  No signs of infection or inflammation.  Continue to follow closely.  Labs in AM.  Sickle cell anemia: Hemoglobin stable and consistent with patient's baseline.  No clinical indication for blood transfusion at this time.  Continue to follow closely.  CBC in a.m.  Atypical chest pain: Resolved.  Chest x-ray showed no acute cardiopulmonary process.  EKG unremarkable.  Continue cardiac monitor   Code Status: Full Code Family Communication: N/A Disposition Plan: Not yet ready for discharge  Nolon Nations  APRN, MSN, FNP-C Patient Care Center [facility] [address] [place], [place] [address] [phone]  If 7PM-7AM, please contact night-coverage.  01/19/2021, 11:39 AM  LOS: 2 days

## 2021-01-20 NOTE — Progress Notes (Signed)
Subjective: Patient is still having significant pain rated as 9 out of 10.  He is taking his home regimen.  Currently on Dilaudid PCA with no IV fluids.  Also Toradol as well as oxycodone 10 mg every 4 hours.  Continue treatment.  He was noted to have fever today.  Objective: Vital signs in last 24 hours: Temp:  [98.4 F (36.9 C)-101 F (38.3 C)] 98.4 F (36.9 C) (02/26 2027) Pulse Rate:  [81-90] 81 (02/26 2027) Resp:  [14-20] 14 (02/26 2027) BP: (116-138)/(66-76) 116/66 (02/26 2027) SpO2:  [90 %-96 %] 95 % (02/26 2027) FiO2 (%):  [0 %] 0 % (02/26 2000) Weight:  [89.5 kg] 89.5 kg (02/26 0520) Weight change: -0.2 kg Last BM Date: 01/17/21  Intake/Output from previous day: 02/25 0701 - 02/26 0700 In: 300 [P.O.:240; I.V.:60] Out: 2200 [Urine:2200] Intake/Output this shift: Total I/O In: -  Out: 750 [Urine:750]  General appearance: alert, cooperative, appears stated age and no distress Neck: no adenopathy, no carotid bruit, no JVD, supple, symmetrical, trachea midline and thyroid not enlarged, symmetric, no tenderness/mass/nodules Back: symmetric, no curvature. ROM normal. No CVA tenderness. Resp: clear to auscultation bilaterally Cardio: regular rate and rhythm, S1, S2 normal, no murmur, click, rub or gallop GI: soft, non-tender; bowel sounds normal; no masses,  no organomegaly Extremities: extremities normal, atraumatic, no cyanosis or edema Pulses: 2+ and symmetric Skin: Skin color, texture, turgor normal. No rashes or lesions Neurologic: Grossly normal  Lab Results: Recent Labs    01/18/21 0506 01/19/21 0508  WBC 13.9* 13.1*  HGB 9.3* 8.8*  HCT 25.7* 24.4*  PLT 368 353   BMET Recent Labs    01/18/21 1207  NA 136  K 3.7  CL 104  CO2 24  GLUCOSE 145*  BUN 5*  CREATININE 0.54*  CALCIUM 8.6*    Studies/Results: No results found.  Medications: I have reviewed the patient's current medications.  Assessment/Plan: 20 year old with sickle cell painful  crisis.  #1 sickle cell painful crisis: Patient is slowly improving but still not at goal.  We will continue current regimen.  Monitoring closely.  Evaluate.  Hopefully may be discharged in the next 24 to 48 hours.  #2 sickle cell anemia: H&H at goal.  He is seem to be at baseline.  Continue monitoring.  #3 atypical chest pain with some fever: May all be related to sickle cell crisis.  Continue to monitor.  #4 leukocytosis: Mild improvement.  Continue to monitor.  LOS: 3 days   Sears Oran,LAWAL 01/20/2021, 11:44 PM

## 2021-01-20 NOTE — Progress Notes (Signed)
New temp of 101. MD notified and Tylenol given.

## 2021-01-21 LAB — CBC WITH DIFFERENTIAL/PLATELET
Abs Immature Granulocytes: 0.05 10*3/uL (ref 0.00–0.07)
Basophils Absolute: 0.1 10*3/uL (ref 0.0–0.1)
Basophils Relative: 0 %
Eosinophils Absolute: 0.1 10*3/uL (ref 0.0–0.5)
Eosinophils Relative: 1 %
HCT: 22.9 % — ABNORMAL LOW (ref 39.0–52.0)
Hemoglobin: 8.1 g/dL — ABNORMAL LOW (ref 13.0–17.0)
Immature Granulocytes: 0 %
Lymphocytes Relative: 22 %
Lymphs Abs: 2.9 10*3/uL (ref 0.7–4.0)
MCH: 30.2 pg (ref 26.0–34.0)
MCHC: 35.4 g/dL (ref 30.0–36.0)
MCV: 85.4 fL (ref 80.0–100.0)
Monocytes Absolute: 1.5 10*3/uL — ABNORMAL HIGH (ref 0.1–1.0)
Monocytes Relative: 12 %
Neutro Abs: 8.4 10*3/uL — ABNORMAL HIGH (ref 1.7–7.7)
Neutrophils Relative %: 65 %
Platelets: 359 10*3/uL (ref 150–400)
RBC: 2.68 MIL/uL — ABNORMAL LOW (ref 4.22–5.81)
RDW: 17.1 % — ABNORMAL HIGH (ref 11.5–15.5)
WBC: 13 10*3/uL — ABNORMAL HIGH (ref 4.0–10.5)
nRBC: 0.5 % — ABNORMAL HIGH (ref 0.0–0.2)

## 2021-01-21 LAB — COMPREHENSIVE METABOLIC PANEL
ALT: 15 U/L (ref 0–44)
AST: 36 U/L (ref 15–41)
Albumin: 3.7 g/dL (ref 3.5–5.0)
Alkaline Phosphatase: 93 U/L (ref 38–126)
Anion gap: 12 (ref 5–15)
BUN: 8 mg/dL (ref 6–20)
CO2: 24 mmol/L (ref 22–32)
Calcium: 8.7 mg/dL — ABNORMAL LOW (ref 8.9–10.3)
Chloride: 99 mmol/L (ref 98–111)
Creatinine, Ser: 0.44 mg/dL — ABNORMAL LOW (ref 0.61–1.24)
GFR, Estimated: >60 mL/min (ref >60)
Glucose, Bld: 88 mg/dL (ref 70–99)
Potassium: 4.3 mmol/L (ref 3.5–5.1)
Sodium: 135 mmol/L (ref 135–145)
Total Bilirubin: 5.1 mg/dL — ABNORMAL HIGH (ref 0.3–1.2)
Total Protein: 7.1 g/dL (ref 6.5–8.1)

## 2021-01-21 NOTE — Plan of Care (Signed)
  Problem: Pain Managment: Goal: General experience of comfort will improve Outcome: Progressing   Problem: Safety: Goal: Ability to remain free from injury will improve Outcome: Progressing   

## 2021-01-21 NOTE — Progress Notes (Signed)
Subjective: Patient is still having significant pain rated as 6 out of 10.  He is improved with no more fevers today.  Patient however is worried about going home due to the weather.  He seems to have rebound of his pain when it rains like this.  His H&H appears to be relatively stable.  It appears that his hemoglobin is holding steady at 8.1.  Leukocytosis is also unchanged.  Objective: Vital signs in last 24 hours: Temp:  [97.6 F (36.4 C)-99.3 F (37.4 C)] 99.3 F (37.4 C) (02/27 1002) Pulse Rate:  [81-89] 87 (02/27 1002) Resp:  [14-24] 24 (02/27 1002) BP: (104-139)/(50-74) 139/64 (02/27 1002) SpO2:  [90 %-99 %] 95 % (02/27 1002) FiO2 (%):  [0 %] 0 % (02/27 0501) Weight:  [89.5 kg] 89.5 kg (02/27 0406) Weight change: 0 kg Last BM Date: 01/17/21  Intake/Output from previous day: 02/26 0701 - 02/27 0700 In: -  Out: 750 [Urine:750] Intake/Output this shift: No intake/output data recorded.  General appearance: alert, cooperative, appears stated age and no distress Neck: no adenopathy, no carotid bruit, no JVD, supple, symmetrical, trachea midline and thyroid not enlarged, symmetric, no tenderness/mass/nodules Back: symmetric, no curvature. ROM normal. No CVA tenderness. Resp: clear to auscultation bilaterally Cardio: regular rate and rhythm, S1, S2 normal, no murmur, click, rub or gallop GI: soft, non-tender; bowel sounds normal; no masses,  no organomegaly Extremities: extremities normal, atraumatic, no cyanosis or edema Pulses: 2+ and symmetric Skin: Skin color, texture, turgor normal. No rashes or lesions Neurologic: Grossly normal  Lab Results: Recent Labs    01/19/21 0508  WBC 13.1*  HGB 8.8*  HCT 24.4*  PLT 353   BMET Recent Labs    01/18/21 1207  NA 136  K 3.7  CL 104  CO2 24  GLUCOSE 145*  BUN 5*  CREATININE 0.54*  CALCIUM 8.6*    Studies/Results: No results found.  Medications: I have reviewed the patient's current  medications.  Assessment/Plan: 20 year old with sickle cell painful crisis.  #1 sickle cell painful crisis: Patient is slowly improving but still may need another day to get back to baseline.  Patient to be mobilized today.  Plan possible discharge tomorrow..  #2 sickle cell anemia: H&H at goal.  He is seem to be at baseline.  Continue monitoring.  #3 atypical chest pain with some fever: No fever today.  Leukocytosis essentially improved.  No antibiotics at this point.  Continue to monitor.  #4 leukocytosis: Mild improvement.  Continue to monitor.  LOS: 4 days   Kc Summerson,LAWAL 01/21/2021, 11:57 AM

## 2021-01-22 DIAGNOSIS — D5700 Hb-SS disease with crisis, unspecified: Secondary | ICD-10-CM | POA: Diagnosis not present

## 2021-01-22 NOTE — Progress Notes (Signed)
Chaplain engaged in an initial visit with [name] Hill and offered support.  Chaplain explained her role.   Chaplain will follow-up.    01/22/21 1200  Clinical Encounter Type  Visited With Patient  Visit Type Initial

## 2021-01-22 NOTE — Discharge Summary (Signed)
Physician Discharge Summary  [name] Hill SJG:[id number] DOB: [birth year] DOA: 01/17/2021  PCP: Kallie Locks, FNP  Admit date: 01/17/2021  Discharge date: 01/22/2021  Discharge Diagnoses:  Principal Problem:   Sickle cell pain crisis (HCC) Active Problems:   Leukocytosis   Hypertension   Chronic pain syndrome   Discharge Condition: Stable  Disposition:   Follow-up Information    Kallie Locks, FNP Follow up.   Specialty: Family Medicine Contact information: [address] [place] [address] [phone]              Pt is discharged home in good condition and is to follow up with Kallie Locks, FNP this week to have labs evaluated. [name] Hill is instructed to increase activity slowly and balance with rest for the next few days, and use prescribed medication to complete treatment of pain  Diet: Regular Wt Readings from Last 3 Encounters:  01/22/21 87.2 kg  01/09/21 90.7 kg  10/17/20 85.2 kg    History of present illness:  [name] Hill  is a 20 y.o. male with a medical history significant for sickle cell disease, chronic pain syndrome, opiate dependence and tolerance, and history of anemia of chronic disease presented to sickle cell day infusion clinic for generalized pain that is consistent with his previous crises.  Patient was treated and evaluated at sickle cell clinic on 01/09/2021 for this problem.  He says that crisis resolved at that time.  He awakened this a.m. with intense pain primarily to low back and lower extremities.  He attributes current pain crisis to changes in weather.  He rates pain as 9/10 characterized as constant and throbbing.  Patient has been taking oxycodone consistently without any relief.  He denies any headache, shortness of breath, urinary symptoms, nausea, vomiting, or diarrhea.  He endorses some pain to central chest.  Patient reports no sick contacts, recent travel, or exposure to COVID-19.  Sickle cell day infusion  clinic course: Vital signs show temperature 98.2 F, BP 135/75, pulse rate 91, respirations 18, and oxygen saturation 95% on 2 L.  CBC shows WBCs 12.9, hemoglobin 8.9, and platelets 384,000.  Total bilirubin is 3.7, otherwise complete metabolic panel is unremarkable.  Patient's pain persisted despite IV Dilaudid PCA, IV Toradol, Tylenol, and IV fluids.  Patient admitted to MedSurg for further management of sickle cell pain crisis.  Hospital Course: Sickle cell disease with pain crisis:   Patient was admitted for sickle cell pain crisis and managed appropriately with IVF, IV Dilaudid via PCA and IV Toradol, as well as other adjunct therapies per sickle cell pain management protocols. IV Dilaudid PCA weaned appropriately, patient transition to home medications. Pain intensity 3/10 and patient is requesting discharge home.  Sickle cell anemia: Hemoglobin 8.1 prior to discharge. He will follow-up with PCP in 1 week to repeat CBC with differential and CMP.  Patient is alert, oriented, and ambulating without assistance. He is afebrile without any signs of infection. Oxygen saturation is 98% on RA.  Patient was therefore discharged home today in a hemodynamically stable condition.   [name] Hill will follow-up with PCP within 1 week of this discharge. [name] Hill was counseled extensively about nonpharmacologic means of pain management, patient verbalized understanding and was appreciative of  the care received during this admission.   We discussed the need for good hydration, monitoring of hydration status, avoidance of heat, cold, stress, and infection triggers. We discussed the need to be adherent with taking Hydrea and other home medications.  Patient was reminded of the need to seek medical attention immediately if any symptom of bleeding, anemia, or infection occurs. Patient is aware of all upcoming appointments. Patient may benefit from hematology referral going forward.  Discharge Exam: Vitals:   01/22/21  0931 01/22/21 1220  BP: 119/63   Pulse: 76   Resp: 13 14  Temp: 98.9 F (37.2 C)   SpO2: 98% 99%   Vitals:   01/22/21 0612 01/22/21 0739 01/22/21 0931 01/22/21 1220  BP: (!) 108/45  119/63   Pulse: 73  76   Resp: 14 20 13 14   Temp: 98.8 F (37.1 C)  98.9 F (37.2 C)   TempSrc: Oral  Oral   SpO2: 100% 96% 98% 99%  Weight: 87.2 kg       General appearance : Awake, alert, not in any distress. Speech Clear. Not toxic looking HEENT: Atraumatic and Normocephalic, pupils equally reactive to light and accomodation Neck: Supple, no JVD. No cervical lymphadenopathy.  Chest: Good air entry bilaterally, no added sounds  CVS: S1 S2 regular, no murmurs.  Abdomen: Bowel sounds present, Non tender and not distended with no gaurding, rigidity or rebound. Extremities: B/L Lower Ext shows no edema, both legs are warm to touch Neurology: Awake alert, and oriented X 3, CN II-XII intact, Non focal Skin: No Rash  Discharge Instructions  Discharge Instructions    Discharge patient   Complete by: As directed    Discharge disposition: 01-Home or Self Care   Discharge patient date: 01/22/2021     Allergies as of 01/22/2021      Reactions   Lisinopril Cough      Medication List    TAKE these medications   acetaminophen 500 MG tablet Commonly known as: TYLENOL Take 1,000 mg by mouth daily as needed (severe pain).   folic acid 1 MG tablet Commonly known as: FOLVITE Take 1 tablet (1 mg total) by mouth daily.   hydroxyurea 400 MG capsule Commonly known as: DROXIA Take 4 capsules (1,600 mg total) by mouth daily.   ibuprofen 800 MG tablet Commonly known as: ADVIL Take 1 tablet (800 mg total) by mouth every 8 (eight) hours as needed.   losartan 50 MG tablet Commonly known as: COZAAR Take 50 mg by mouth daily.   Oxycodone HCl 10 MG Tabs Take 1 tablet (10 mg total) by mouth every 4 (four) hours as needed. What changed: reasons to take this       The results of significant  diagnostics from this hospitalization (including imaging, microbiology, ancillary and laboratory) are listed below for reference.    Significant Diagnostic Studies: DG Chest 2 View  Result Date: 01/18/2021 CLINICAL DATA:  20 y.o male. Sickle cell crisis EXAM: CHEST - 2 VIEW COMPARISON:  Chest radiograph 10/01/2020 FINDINGS: Stable cardiomediastinal contours with enlarged heart size. Low lung volumes. No focal consolidation. No pneumothorax or pleural effusion. No acute finding in the visualized skeleton. IMPRESSION: No acute cardiopulmonary process. Electronically Signed   By: Emmaline KluverNancy  Ballantyne M.D.   On: 01/18/2021 14:25    Microbiology: Recent Results (from the past 240 hour(s))  SARS CORONAVIRUS 2 (TAT 6-24 HRS) Nasopharyngeal Nasopharyngeal Swab     Status: None   Collection Time: 01/17/21  7:03 PM   Specimen: Nasopharyngeal Swab  Result Value Ref Range Status   SARS Coronavirus 2 NEGATIVE NEGATIVE Final    Comment: (NOTE) SARS-CoV-2 target nucleic acids are NOT DETECTED.  The SARS-CoV-2 RNA is generally detectable in upper and lower respiratory specimens during  the acute phase of infection. Negative results do not preclude SARS-CoV-2 infection, do not rule out co-infections with other pathogens, and should not be used as the sole basis for treatment or other patient management decisions. Negative results must be combined with clinical observations, patient history, and epidemiological information. The expected result is Negative.  Fact Sheet for Patients: HairSlick.no  Fact Sheet for Healthcare Providers: quierodirigir.com  This test is not yet approved or cleared by the [place] FDA and  has been authorized for detection and/or diagnosis of SARS-CoV-2 by FDA under an Emergency Use Authorization (EUA). This EUA will remain  in effect (meaning this test can be used) for the duration of the COVID-19 declaration under Se  ction 564(b)(1) of the Act, 21 U.S.C. section 360bbb-3(b)(1), unless the authorization is terminated or revoked sooner.  Performed at [facility], [address] [place], [place] [address]   Culture, blood (Routine X 2) w Reflex to ID Panel     Status: None (Preliminary result)   Collection Time: 01/19/21 12:06 PM   Specimen: BLOOD  Result Value Ref Range Status   Specimen Description   Final    BLOOD RIGHT ANTECUBITAL Performed at [facility], [address], [place], [place] [address]    Special Requests   Final    BOTTLES DRAWN AEROBIC ONLY Blood Culture results may not be optimal due to an inadequate volume of blood received in culture bottles Performed at [facility], [address] [place], [place] [address]    Culture   Final    NO GROWTH 3 DAYS Performed at [facility], [address], [place], [place] [address]    Report Status PENDING  Incomplete  Culture, blood (Routine X 2) w Reflex to ID Panel     Status: None (Preliminary result)   Collection Time: 01/19/21 12:06 PM   Specimen: BLOOD  Result Value Ref Range Status   Specimen Description   Final    BLOOD RIGHT ANTECUBITAL Performed at [facility], [address] [place], [place] [address]    Special Requests   Final    BOTTLES DRAWN AEROBIC ONLY Blood Culture results may not be optimal due to an inadequate volume of blood received in culture bottles Performed at [facility], [address] [place], [place] [address]    Culture   Final    NO GROWTH 3 DAYS Performed at [facility], [address], [place], [place] [address]    Report Status PENDING  Incomplete     Labs: Basic Metabolic Panel: Recent Labs  Lab 01/17/21 1120 01/18/21 1207 01/21/21 1227  NA 141 136 135  K 3.8 3.7 4.3  CL 106 104 99  CO2 26 24 24   GLUCOSE 105* 145* 88  BUN 7 5* 8  CREATININE 0.64 0.54* 0.44*  CALCIUM 8.9 8.6* 8.7*    Liver Function Tests: Recent Labs  Lab 01/17/21 1120 01/21/21 1227  AST 38 36  ALT 19 15  ALKPHOS 96 93  BILITOT 3.7* 5.1*  PROT 7.0 7.1  ALBUMIN 4.2 3.7   No results for input(s): LIPASE, AMYLASE in the last 168 hours. No results for input(s): AMMONIA in the last 168 hours. CBC: Recent Labs  Lab 01/17/21 1120 01/18/21 0506 01/19/21 0508 01/21/21 1227  WBC 12.9* 13.9* 13.1* 13.0*  NEUTROABS 5.8  --   --  8.4*  HGB 8.9* 9.3* 8.8* 8.1*  HCT 24.8* 25.7* 24.4*  22.9*  MCV 84.6 83.4 83.8 85.4  PLT 384 368 353 359   Cardiac Enzymes: No results for input(s): CKTOTAL, CKMB, CKMBINDEX, TROPONINI in the last 168 hours. BNP: Invalid input(s): POCBNP CBG: No results for input(s): GLUCAP in the last 168 hours.  Time coordinating discharge: 35 minutes  Signed:  Nolon Nations  APRN, MSN, FNP-C [facility] [address] [place], [place] [address] [phone]  [facility] 01/22/2021, 4:58 PM

## 2021-01-22 NOTE — Discharge Instructions (Signed)
Sickle Cell Anemia, Adult  Sickle cell anemia is a condition where your red blood cells are shaped like sickles. Red blood cells carry oxygen through the body. Sickle-shaped cells do not live as long as normal red blood cells. They also clump together and block blood from flowing through the blood vessels. This prevents the body from getting enough oxygen. Sickle cell anemia causes organ damage and pain. It also increases the risk of infection. Follow these instructions at home: Medicines  Take over-the-counter and prescription medicines only as told by your doctor.  If you were prescribed an antibiotic medicine, take it as told by your doctor. Do not stop taking the antibiotic even if you start to feel better.  If you develop a fever, do not take medicines to lower the fever right away. Tell your doctor about the fever. Managing pain, stiffness, and swelling  Try these methods to help with pain: ? Use a heating pad. ? Take a warm bath. ? Distract yourself, such as by watching TV. Eating and drinking  Drink enough fluid to keep your pee (urine) clear or pale yellow. Drink more in hot weather and during exercise.  Limit or avoid alcohol.  Eat a healthy diet. Eat plenty of fruits, vegetables, whole grains, and lean protein.  Take vitamins and supplements as told by your doctor. Traveling  When traveling, keep these with you: ? Your medical information. ? The names of your doctors. ? Your medicines.  If you need to take an airplane, talk to your doctor first. Activity  Rest often.  Avoid exercises that make your heart beat much faster, such as jogging. General instructions  Do not use products that have nicotine or tobacco, such as cigarettes and e-cigarettes. If you need help quitting, ask your doctor.  Consider wearing a medical alert bracelet.  Avoid being in high places (high altitudes), such as mountains.  Avoid very hot or cold temperatures.  Avoid places where the  temperature changes a lot.  Keep all follow-up visits as told by your doctor. This is important. Contact a doctor if:  A joint hurts.  Your feet or hands hurt or swell.  You feel tired (fatigued). Get help right away if:  You have symptoms of infection. These include: ? Fever. ? Chills. ? Being very tired. ? Irritability. ? Poor eating. ? Throwing up (vomiting).  You feel dizzy or faint.  You have new stomach pain, especially on the left side.  You have a an erection (priapism) that lasts more than 4 hours.  You have numbness in your arms or legs.  You have a hard time moving your arms or legs.  You have trouble talking.  You have pain that does not go away when you take medicine.  You are short of breath.  You are breathing fast.  You have a long-term cough.  You have pain in your chest.  You have a bad headache.  You have a stiff neck.  Your stomach looks bloated even though you did not eat much.  Your skin is pale.  You suddenly cannot see well. Summary  Sickle cell anemia is a condition where your red blood cells are shaped like sickles.  Follow your doctor's advice on ways to manage pain, food to eat, activities to do, and steps to take for safe travel.  Get medical help right away if you have any signs of infection, such as a fever. This information is not intended to replace advice given to you by   your health care provider. Make sure you discuss any questions you have with your health care provider. Document Revised: 04/06/2020 Document Reviewed: 04/06/2020 Elsevier Patient Education  2021 [employer]  

## 2021-01-24 ENCOUNTER — Telehealth: Payer: Self-pay

## 2021-01-24 LAB — CULTURE, BLOOD (ROUTINE X 2)
Culture: NO GROWTH
Culture: NO GROWTH

## 2021-01-24 NOTE — Telephone Encounter (Signed)
Pt states just got out of the hospital and needs a f/u . However your schedule is BOOKED can we double book somewhere since hes a Sickle cell pt?

## 2021-01-24 NOTE — Telephone Encounter (Signed)
Transition Care Management Unsuccessful Follow-up Telephone Call  Date of discharge and from where:  01/22/2021 from [facility]  Attempts:  1st Attempt  Reason for unsuccessful TCM follow-up call:  Left voice message

## 2021-01-25 ENCOUNTER — Telehealth: Payer: Self-pay | Admitting: *Deleted

## 2021-01-25 NOTE — Telephone Encounter (Signed)
Transition Care Management Unsuccessful Follow-up Telephone Call  Date of discharge and from where:  01-23-2021  [name] Hill   Attempts:  2nd  Reason for unsuccessful TCM follow-up call:  Left voicemail/no answer

## 2021-01-26 ENCOUNTER — Ambulatory Visit (INDEPENDENT_AMBULATORY_CARE_PROVIDER_SITE_OTHER): Payer: Medicaid Other | Admitting: Family Medicine

## 2021-01-26 ENCOUNTER — Other Ambulatory Visit: Payer: Self-pay

## 2021-01-26 ENCOUNTER — Encounter: Payer: Self-pay | Admitting: Family Medicine

## 2021-01-26 VITALS — BP 116/59 | HR 81 | Temp 96.8°F | Ht 67.0 in | Wt 192.0 lb

## 2021-01-26 DIAGNOSIS — D5700 Hb-SS disease with crisis, unspecified: Secondary | ICD-10-CM

## 2021-01-26 DIAGNOSIS — G894 Chronic pain syndrome: Secondary | ICD-10-CM

## 2021-01-26 DIAGNOSIS — F1190 Opioid use, unspecified, uncomplicated: Secondary | ICD-10-CM

## 2021-01-26 DIAGNOSIS — Z09 Encounter for follow-up examination after completed treatment for conditions other than malignant neoplasm: Secondary | ICD-10-CM

## 2021-01-26 DIAGNOSIS — D571 Sickle-cell disease without crisis: Secondary | ICD-10-CM | POA: Diagnosis not present

## 2021-01-26 DIAGNOSIS — R0789 Other chest pain: Secondary | ICD-10-CM

## 2021-01-26 DIAGNOSIS — Z79899 Other long term (current) drug therapy: Secondary | ICD-10-CM

## 2021-01-26 MED ORDER — OXYCODONE HCL 10 MG PO TABS
10.0000 mg | ORAL_TABLET | ORAL | 0 refills | Status: DC | PRN
Start: 2021-01-26 — End: 2022-01-10

## 2021-01-26 MED ORDER — LOSARTAN POTASSIUM 50 MG PO TABS: 50.0000 mg | ORAL_TABLET | Freq: Every day | ORAL | 3 refills | Status: DC
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
## 2021-01-29 ENCOUNTER — Encounter: Payer: Self-pay | Admitting: Family Medicine

## 2021-02-21 ENCOUNTER — Emergency Department (HOSPITAL_COMMUNITY)
Admission: EM | Admit: 2021-02-21 | Discharge: 2021-02-21 | Disposition: A | Discharge status: Home / Self Care | Payer: Medicaid Other | Attending: Emergency Medicine | Admitting: Emergency Medicine

## 2021-02-21 ENCOUNTER — Encounter (HOSPITAL_COMMUNITY): Payer: Self-pay

## 2021-02-21 ENCOUNTER — Other Ambulatory Visit: Payer: Self-pay

## 2021-02-21 DIAGNOSIS — J45909 Unspecified asthma, uncomplicated: Secondary | ICD-10-CM | POA: Insufficient documentation

## 2021-02-21 DIAGNOSIS — Z79899 Other long term (current) drug therapy: Secondary | ICD-10-CM | POA: Insufficient documentation

## 2021-02-21 DIAGNOSIS — I10 Essential (primary) hypertension: Secondary | ICD-10-CM | POA: Diagnosis not present

## 2021-02-21 DIAGNOSIS — D5700 Hb-SS disease with crisis, unspecified: Secondary | ICD-10-CM | POA: Insufficient documentation

## 2021-02-21 DIAGNOSIS — R0781 Pleurodynia: Secondary | ICD-10-CM | POA: Diagnosis present

## 2021-02-21 DIAGNOSIS — Z8616 Personal history of COVID-19: Secondary | ICD-10-CM | POA: Insufficient documentation

## 2021-02-21 LAB — COMPREHENSIVE METABOLIC PANEL
ALT: 11 U/L (ref 0–44)
AST: 36 U/L (ref 15–41)
Albumin: 4.2 g/dL (ref 3.5–5.0)
Alkaline Phosphatase: 107 U/L (ref 38–126)
Anion gap: 7 (ref 5–15)
BUN: 10 mg/dL (ref 6–20)
CO2: 26 mmol/L (ref 22–32)
Calcium: 9.1 mg/dL (ref 8.9–10.3)
Chloride: 107 mmol/L (ref 98–111)
Creatinine, Ser: 0.69 mg/dL (ref 0.61–1.24)
GFR, Estimated: >60 mL/min (ref >60)
Glucose, Bld: 87 mg/dL (ref 70–99)
Potassium: 4.2 mmol/L (ref 3.5–5.1)
Sodium: 140 mmol/L (ref 135–145)
Total Bilirubin: 3.2 mg/dL — ABNORMAL HIGH (ref 0.3–1.2)
Total Protein: 7 g/dL (ref 6.5–8.1)

## 2021-02-21 LAB — CBC WITH DIFFERENTIAL/PLATELET
Abs Immature Granulocytes: 0.03 10*3/uL (ref 0.00–0.07)
Basophils Absolute: 0.2 10*3/uL — ABNORMAL HIGH (ref 0.0–0.1)
Basophils Relative: 1 %
Eosinophils Absolute: 0.4 10*3/uL (ref 0.0–0.5)
Eosinophils Relative: 3 %
HCT: 27.7 % — ABNORMAL LOW (ref 39.0–52.0)
Hemoglobin: 9.9 g/dL — ABNORMAL LOW (ref 13.0–17.0)
Immature Granulocytes: 0 %
Lymphocytes Relative: 44 %
Lymphs Abs: 4.5 10*3/uL — ABNORMAL HIGH (ref 0.7–4.0)
MCH: 30 pg (ref 26.0–34.0)
MCHC: 35.7 g/dL (ref 30.0–36.0)
MCV: 83.9 fL (ref 80.0–100.0)
Monocytes Absolute: 1.1 10*3/uL — ABNORMAL HIGH (ref 0.1–1.0)
Monocytes Relative: 11 %
Neutro Abs: 4.2 10*3/uL (ref 1.7–7.7)
Neutrophils Relative %: 41 %
Platelets: 263 10*3/uL (ref 150–400)
RBC: 3.3 MIL/uL — ABNORMAL LOW (ref 4.22–5.81)
RDW: 18.5 % — ABNORMAL HIGH (ref 11.5–15.5)
WBC: 10.4 10*3/uL (ref 4.0–10.5)
nRBC: 0.3 % — ABNORMAL HIGH (ref 0.0–0.2)

## 2021-02-21 LAB — RETICULOCYTES
Immature Retic Fract: 23.6 % — ABNORMAL HIGH (ref 2.3–15.9)
RBC.: 3.34 MIL/uL — ABNORMAL LOW (ref 4.22–5.81)
Retic Count, Absolute: 262.5 10*3/uL — ABNORMAL HIGH (ref 19.0–186.0)
Retic Ct Pct: 8 % — ABNORMAL HIGH (ref 0.4–3.1)

## 2021-02-21 MED ORDER — DIPHENHYDRAMINE HCL 50 MG/ML IJ SOLN
25.0000 mg | Freq: Once | INTRAMUSCULAR | Status: AC
  Administered 2021-02-21: 25 mg via INTRAVENOUS
  Filled 2021-02-21: qty 1

## 2021-02-21 MED ORDER — KETOROLAC TROMETHAMINE 15 MG/ML IJ SOLN
15.0000 mg | INTRAMUSCULAR | Status: AC
  Administered 2021-02-21: 15 mg via INTRAVENOUS
  Filled 2021-02-21: qty 1

## 2021-02-21 MED ORDER — HYDROMORPHONE HCL 2 MG/ML IJ SOLN: 2.0000 mg | INTRAMUSCULAR | Status: AC

## 2021-02-21 MED ORDER — ONDANSETRON HCL 4 MG/2ML IJ SOLN
4.0000 mg | INTRAMUSCULAR | Status: DC | PRN
  Administered 2021-02-21: 4 mg via INTRAVENOUS
  Filled 2021-02-21: qty 2

## 2021-02-21 MED ORDER — HYDROMORPHONE HCL 2 MG/ML IJ SOLN
2.0000 mg | INTRAMUSCULAR | Status: AC
  Administered 2021-02-21: 2 mg via INTRAVENOUS
  Filled 2021-02-21: qty 1

## 2021-02-21 NOTE — ED Triage Notes (Addendum)
Pt states since 1100 today he has experienced pain in his chest and flank area on the right side. Pt states he has taken 10mg  oxycodone and 800mg  of Ibuprofen with no relief. Pt is A&Ox4 denies SHOB at this time.
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
## 2021-02-21 NOTE — Discharge Instructions (Addendum)
Your laboratory results are within normal limits today.  You may continue your home medication as needed.  Follow-up with your primary care physician as needed.

## 2021-02-22 ENCOUNTER — Telehealth: Payer: Self-pay | Admitting: *Deleted

## 2021-02-22 NOTE — Telephone Encounter (Signed)
Transition Care Management Unsuccessful Follow-up Telephone Call  Date of discharge and from where:  02/21/2021 - [facility]  Attempts:  1st Attempt  Reason for unsuccessful TCM follow-up call:  Left voice message

## 2021-02-23 NOTE — Telephone Encounter (Signed)
Transition Care Management Follow-up Telephone Call  Date of discharge and from where: 03/30/20221 from [facility]  How have you been since you were released from the hospital? Pt stated that he is feeling much better.   Any questions or concerns? No  Items Reviewed:  Did the pt receive and understand the discharge instructions provided? Yes   Medications obtained and verified? Yes   Other? No   Any new allergies since your discharge? No   Dietary orders reviewed? n/a  Do you have support at home? Yes   Functional Questionnaire: (I = Independent and D = Dependent) ADLs: I  Bathing/Dressing- I  Meal Prep- I  Eating- I  Maintaining continence- I  Transferring/Ambulation- I  Managing Meds- I   Follow up appointments reviewed:   Are transportation arrangements needed? No   If their condition worsens, is the pt aware to call PCP or go to the Emergency Dept.? Yes  Was the patient provided with contact information for the PCP's office or ED? Yes  Was to pt encouraged to call back with questions or concerns? Yes

## 2021-02-26 ENCOUNTER — Non-Acute Institutional Stay (HOSPITAL_COMMUNITY)
Admission: AD | Admit: 2021-02-26 | Discharge: 2021-02-26 | Disposition: A | Discharge status: Home / Self Care | Payer: Medicaid Other | Source: Ambulatory Visit | Attending: Internal Medicine | Admitting: Internal Medicine

## 2021-02-26 ENCOUNTER — Telehealth (HOSPITAL_COMMUNITY): Payer: Self-pay | Admitting: General Practice

## 2021-02-26 DIAGNOSIS — Z79899 Other long term (current) drug therapy: Secondary | ICD-10-CM | POA: Insufficient documentation

## 2021-02-26 DIAGNOSIS — F1120 Opioid dependence, uncomplicated: Secondary | ICD-10-CM | POA: Diagnosis not present

## 2021-02-26 DIAGNOSIS — D5700 Hb-SS disease with crisis, unspecified: Secondary | ICD-10-CM | POA: Diagnosis present

## 2021-02-26 DIAGNOSIS — Z888 Allergy status to other drugs, medicaments and biological substances status: Secondary | ICD-10-CM | POA: Diagnosis not present

## 2021-02-26 DIAGNOSIS — Z8249 Family history of ischemic heart disease and other diseases of the circulatory system: Secondary | ICD-10-CM | POA: Diagnosis not present

## 2021-02-26 DIAGNOSIS — I10 Essential (primary) hypertension: Secondary | ICD-10-CM | POA: Insufficient documentation

## 2021-02-26 DIAGNOSIS — Z8616 Personal history of COVID-19: Secondary | ICD-10-CM | POA: Insufficient documentation

## 2021-02-26 DIAGNOSIS — G894 Chronic pain syndrome: Secondary | ICD-10-CM | POA: Insufficient documentation

## 2021-02-26 DIAGNOSIS — Z832 Family history of diseases of the blood and blood-forming organs and certain disorders involving the immune mechanism: Secondary | ICD-10-CM | POA: Diagnosis not present

## 2021-02-26 LAB — RAPID URINE DRUG SCREEN, HOSP PERFORMED
Amphetamines: NOT DETECTED
Barbiturates: NOT DETECTED
Benzodiazepines: NOT DETECTED
Cocaine: NOT DETECTED
Opiates: POSITIVE — AB
Tetrahydrocannabinol: POSITIVE — AB

## 2021-02-26 LAB — COMPREHENSIVE METABOLIC PANEL
ALT: 25 U/L (ref 0–44)
AST: 39 U/L (ref 15–41)
Albumin: 4.1 g/dL (ref 3.5–5.0)
Alkaline Phosphatase: 111 U/L (ref 38–126)
Anion gap: 10 (ref 5–15)
BUN: 7 mg/dL (ref 6–20)
CO2: 23 mmol/L (ref 22–32)
Calcium: 9.2 mg/dL (ref 8.9–10.3)
Chloride: 106 mmol/L (ref 98–111)
Creatinine, Ser: 0.73 mg/dL (ref 0.61–1.24)
GFR, Estimated: >60 mL/min (ref >60)
Glucose, Bld: 103 mg/dL — ABNORMAL HIGH (ref 70–99)
Potassium: 3.7 mmol/L (ref 3.5–5.1)
Sodium: 139 mmol/L (ref 135–145)
Total Bilirubin: 4.1 mg/dL — ABNORMAL HIGH (ref 0.3–1.2)
Total Protein: 7.1 g/dL (ref 6.5–8.1)

## 2021-02-26 LAB — URINALYSIS, ROUTINE W REFLEX MICROSCOPIC
Bacteria, UA: NONE SEEN
Bilirubin Urine: NEGATIVE
Glucose, UA: NEGATIVE mg/dL
Hgb urine dipstick: NEGATIVE
Ketones, ur: NEGATIVE mg/dL
Nitrite: NEGATIVE
Protein, ur: 100 mg/dL — AB
Specific Gravity, Urine: 1.012 (ref 1.005–1.030)
pH: 6 (ref 5.0–8.0)

## 2021-02-26 LAB — CBC WITH DIFFERENTIAL/PLATELET
Abs Immature Granulocytes: 0.24 10*3/uL — ABNORMAL HIGH (ref 0.00–0.07)
Basophils Absolute: 0.2 10*3/uL — ABNORMAL HIGH (ref 0.0–0.1)
Basophils Relative: 1 %
Eosinophils Absolute: 0.2 10*3/uL (ref 0.0–0.5)
Eosinophils Relative: 1 %
HCT: 27 % — ABNORMAL LOW (ref 39.0–52.0)
Hemoglobin: 9.8 g/dL — ABNORMAL LOW (ref 13.0–17.0)
Immature Granulocytes: 2 %
Lymphocytes Relative: 33 %
Lymphs Abs: 4.1 10*3/uL — ABNORMAL HIGH (ref 0.7–4.0)
MCH: 29.8 pg (ref 26.0–34.0)
MCHC: 36.3 g/dL — ABNORMAL HIGH (ref 30.0–36.0)
MCV: 82.1 fL (ref 80.0–100.0)
Monocytes Absolute: 1.4 10*3/uL — ABNORMAL HIGH (ref 0.1–1.0)
Monocytes Relative: 11 %
Neutro Abs: 6.4 10*3/uL (ref 1.7–7.7)
Neutrophils Relative %: 52 %
Platelets: 294 10*3/uL (ref 150–400)
RBC: 3.29 MIL/uL — ABNORMAL LOW (ref 4.22–5.81)
RDW: 19.4 % — ABNORMAL HIGH (ref 11.5–15.5)
WBC: 12.5 10*3/uL — ABNORMAL HIGH (ref 4.0–10.5)
nRBC: 0.5 % — ABNORMAL HIGH (ref 0.0–0.2)

## 2021-02-26 LAB — RETICULOCYTES
Immature Retic Fract: 27.4 % — ABNORMAL HIGH (ref 2.3–15.9)
RBC.: 3.26 MIL/uL — ABNORMAL LOW (ref 4.22–5.81)
Retic Count, Absolute: 221 10*3/uL — ABNORMAL HIGH (ref 19.0–186.0)
Retic Ct Pct: 6.4 % — ABNORMAL HIGH (ref 0.4–3.1)

## 2021-02-26 LAB — LACTATE DEHYDROGENASE: LDH: 331 U/L — ABNORMAL HIGH (ref 98–192)

## 2021-02-26 MED ORDER — SODIUM CHLORIDE 0.9 % IV SOLN
25.0000 mg | INTRAVENOUS | Status: DC | PRN
  Filled 2021-02-26: qty 0.5

## 2021-02-26 MED ORDER — SODIUM CHLORIDE 0.9% FLUSH: 9.0000 mL | INTRAVENOUS | Status: DC | PRN

## 2021-02-26 MED ORDER — NALOXONE HCL 0.4 MG/ML IJ SOLN: 0.4000 mg | INTRAMUSCULAR | Status: DC | PRN

## 2021-02-26 MED ORDER — ACETAMINOPHEN 500 MG PO TABS
1000.0000 mg | ORAL_TABLET | Freq: Once | ORAL | Status: AC
  Administered 2021-02-26: 1000 mg via ORAL
  Filled 2021-02-26: qty 2

## 2021-02-26 MED ORDER — ONDANSETRON HCL 4 MG/2ML IJ SOLN: 4.0000 mg | Freq: Four times a day (QID) | INTRAMUSCULAR | Status: DC | PRN

## 2021-02-26 MED ORDER — SODIUM CHLORIDE 0.45 % IV SOLN: INTRAVENOUS | Status: DC

## 2021-02-26 MED ORDER — DIPHENHYDRAMINE HCL 25 MG PO CAPS
25.0000 mg | ORAL_CAPSULE | ORAL | Status: DC | PRN
  Administered 2021-02-26: 25 mg via ORAL
  Filled 2021-02-26: qty 1

## 2021-02-26 MED ORDER — KETOROLAC TROMETHAMINE 30 MG/ML IJ SOLN
15.0000 mg | Freq: Once | INTRAMUSCULAR | Status: AC
  Administered 2021-02-26: 15 mg via INTRAVENOUS
  Filled 2021-02-26: qty 1

## 2021-02-26 MED ORDER — HYDROMORPHONE 1 MG/ML IV SOLN
INTRAVENOUS | Status: DC
  Administered 2021-02-26: 30 mg via INTRAVENOUS
  Administered 2021-02-26: 8 mg via INTRAVENOUS
  Filled 2021-02-26: qty 30

## 2021-02-26 NOTE — Discharge Summary (Signed)
[facility] Discharge Summary   Patient ID: [name] Hill MRN: [id number] DOB/AGE: [birth year] 20 y.o.  Admit date: 02/26/2021 Discharge date: 02/26/2021  Primary Care Physician:  Kallie Locks, FNP  Admission Diagnoses:  Active Problems:   Sickle cell pain crisis [facility])   Discharge Medications:  Allergies as of 02/26/2021      Reactions   Lisinopril Cough      Medication List    TAKE these medications   acetaminophen 500 MG tablet Commonly known as: TYLENOL Take 1,000 mg by mouth daily as needed (severe pain).   folic acid 1 MG tablet Commonly known as: FOLVITE Take 1 tablet (1 mg total) by mouth daily.   hydroxyurea 400 MG capsule Commonly known as: DROXIA Take 4 capsules (1,600 mg total) by mouth daily.   ibuprofen 800 MG tablet Commonly known as: ADVIL Take 1 tablet (800 mg total) by mouth every 8 (eight) hours as needed.   losartan 50 MG tablet Commonly known as: COZAAR Take 1 tablet (50 mg total) by mouth daily.   Oxycodone HCl 10 MG Tabs Take 1 tablet (10 mg total) by mouth every 4 (four) hours as needed (pain).        Consults:  None  Significant Diagnostic Studies:  No results found.  History of present illness:  [name] Hill is a 20 year old male with a medical history significant for sickle cell disease, chronic pain syndrome, opiate dependence and tolerance,  and history of anemia of chronic disease right chest  and lower extremity pain with his typical sickle cell pain crisis.  Pain intensity has been elevated over the past several days and has not been relieved by home medications. He last had oxycodone this am without much improvement. Patient rates his pain as 6/10. Pain is characterized as constant and aching. He has not identified any aggravating factors concerning crisis.  and this a.m. Patient currently denies any fever, chills, shortness of breath, dizziness, urinary symptoms, nausea, vomiting or diarrhea. No sick contacts,  recent travel or exposure to COVID-19.   [facility] Course:  Patient admitted to sickle cell day clinic for management of pain crisis.  All laboratory values reviewed, hemoglobin 9.8, which is consistent with patient's baseline.  All of the laboratory values unremarkable. Pain managed with IV Dilaudid via PCA Toradol 15 mg IV x1 Tylenol 1000 mg x 1 IV fluids, 0.45% saline at 100 mL/h Pain intensity decreased to 3/10.  No admission warranted on today.  Patient is alert, oriented, and ambulating without assistance and will discharge home in hemodynamically stable condition.  Discharge instructions: Resume all home medications.   Follow up with PCP as previously  scheduled.   Discussed the importance of drinking 64 ounces of water daily, dehydration of red blood cells may lead further sickling.   Avoid all stressors that precipitate sickle cell pain crisis.     The patient was given clear instructions to go to ER or return to medical center if symptoms do not improve, worsen or new problems develop.    Physical Exam at Discharge:  BP 128/66 (BP Location: Right Arm)   Pulse 60   Temp 99.3 F (37.4 C) (Temporal)   Resp 16   SpO2 96%   Physical Exam Constitutional:      Appearance: He is obese.  Eyes:     Pupils: Pupils are equal, round, and reactive to light.  Cardiovascular:     Rate and Rhythm: Normal rate and regular rhythm.  Pulmonary:     Effort: Pulmonary effort is normal.  Abdominal:     General: Bowel sounds are normal.  Musculoskeletal:        General: Normal range of motion.  Skin:    General: Skin is warm.  Neurological:     General: No focal deficit present.     Mental Status: He is alert. Mental status is at baseline.  Psychiatric:        Mood and Affect: Mood normal.        Behavior: Behavior normal.        Thought Content: Thought content normal.        Judgment: Judgment normal.      Disposition at Discharge: Discharge  disposition: 01-Home or Self Care       Discharge Orders: Discharge Instructions    Discharge patient   Complete by: As directed    Discharge disposition: 01-Home or Self Care   Discharge patient date: 02/26/2021      Condition at Discharge:   Stable  Time spent on Discharge:  Greater than 30 minutes.  Signed: Nolon Nations  APRN, MSN, FNP-C Patient Care [facility] [address] [place], [place] [address] [phone]  02/26/2021, 2:38 PM

## 2021-02-26 NOTE — Progress Notes (Signed)
Patient admitted to the day infusion hospital for sickle cell pain. Initially, patient reported right chest and bilateral leg pain rated 6/10. For pain management, patient placed on Dilaudid PCA, given Tylenol, Toradol and hydrated with IV fluids. At discharge, patient rated pain at 3/10. Vital signs stable. AVS offered but patient refused. Patient alert, oriented and ambulatory at discharge.

## 2021-02-26 NOTE — Telephone Encounter (Signed)
Patient called, requesting to come to the day hospital due to pain in the jaw, "typical" right chest rated at 6/10. Denied fever, diarrhea, abdominal pain, nausea/vomitting and priapism. Screened negative for Covid-19 symptoms. Admitted to having means of transportation without driving self after treatment. Last took 10 mg of oxycodone and 800 mg of Ibuprofen at 04:00 am today. Per provider, patient can come to the day hospital for treatment. Patient notified, verbalized understanding.
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
## 2021-02-26 NOTE — Discharge Instructions (Signed)
Sickle Cell Anemia, Adult  Sickle cell anemia is a condition where your red blood cells are shaped like sickles. Red blood cells carry oxygen through the body. Sickle-shaped cells do not live as long as normal red blood cells. They also clump together and block blood from flowing through the blood vessels. This prevents the body from getting enough oxygen. Sickle cell anemia causes organ damage and pain. It also increases the risk of infection. Follow these instructions at home: Medicines  Take over-the-counter and prescription medicines only as told by your doctor.  If you were prescribed an antibiotic medicine, take it as told by your doctor. Do not stop taking the antibiotic even if you start to feel better.  If you develop a fever, do not take medicines to lower the fever right away. Tell your doctor about the fever. Managing pain, stiffness, and swelling  Try these methods to help with pain: ? Use a heating pad. ? Take a warm bath. ? Distract yourself, such as by watching TV. Eating and drinking  Drink enough fluid to keep your pee (urine) clear or pale yellow. Drink more in hot weather and during exercise.  Limit or avoid alcohol.  Eat a healthy diet. Eat plenty of fruits, vegetables, whole grains, and lean protein.  Take vitamins and supplements as told by your doctor. Traveling  When traveling, keep these with you: ? Your medical information. ? The names of your doctors. ? Your medicines.  If you need to take an airplane, talk to your doctor first. Activity  Rest often.  Avoid exercises that make your heart beat much faster, such as jogging. General instructions  Do not use products that have nicotine or tobacco, such as cigarettes and e-cigarettes. If you need help quitting, ask your doctor.  Consider wearing a medical alert bracelet.  Avoid being in high places (high altitudes), such as mountains.  Avoid very hot or cold temperatures.  Avoid places where the  temperature changes a lot.  Keep all follow-up visits as told by your doctor. This is important. Contact a doctor if:  A joint hurts.  Your feet or hands hurt or swell.  You feel tired (fatigued). Get help right away if:  You have symptoms of infection. These include: ? Fever. ? Chills. ? Being very tired. ? Irritability. ? Poor eating. ? Throwing up (vomiting).  You feel dizzy or faint.  You have new stomach pain, especially on the left side.  You have a an erection (priapism) that lasts more than 4 hours.  You have numbness in your arms or legs.  You have a hard time moving your arms or legs.  You have trouble talking.  You have pain that does not go away when you take medicine.  You are short of breath.  You are breathing fast.  You have a long-term cough.  You have pain in your chest.  You have a bad headache.  You have a stiff neck.  Your stomach looks bloated even though you did not eat much.  Your skin is pale.  You suddenly cannot see well. Summary  Sickle cell anemia is a condition where your red blood cells are shaped like sickles.  Follow your doctor's advice on ways to manage pain, food to eat, activities to do, and steps to take for safe travel.  Get medical help right away if you have any signs of infection, such as a fever. This information is not intended to replace advice given to you by   your health care provider. Make sure you discuss any questions you have with your health care provider. Document Revised: 04/06/2020 Document Reviewed: 04/06/2020 Elsevier Patient Education  2021 [employer]  

## 2021-03-05 ENCOUNTER — Ambulatory Visit: Payer: Medicaid Other | Admitting: Family Medicine

## 2021-03-06 ENCOUNTER — Ambulatory Visit (INDEPENDENT_AMBULATORY_CARE_PROVIDER_SITE_OTHER): Payer: Medicaid Other | Admitting: Family Medicine

## 2021-03-06 ENCOUNTER — Other Ambulatory Visit: Payer: Self-pay

## 2021-03-06 ENCOUNTER — Encounter: Payer: Self-pay | Admitting: Family Medicine

## 2021-03-06 VITALS — BP 118/65 | HR 78 | Temp 100.2°F | Ht 67.0 in | Wt 194.0 lb

## 2021-03-06 DIAGNOSIS — K0889 Other specified disorders of teeth and supporting structures: Secondary | ICD-10-CM

## 2021-03-06 DIAGNOSIS — G894 Chronic pain syndrome: Secondary | ICD-10-CM

## 2021-03-06 DIAGNOSIS — D571 Sickle-cell disease without crisis: Secondary | ICD-10-CM

## 2021-03-06 DIAGNOSIS — G43811 Other migraine, intractable, with status migrainosus: Secondary | ICD-10-CM

## 2021-03-06 DIAGNOSIS — F1190 Opioid use, unspecified, uncomplicated: Secondary | ICD-10-CM

## 2021-03-06 DIAGNOSIS — Z09 Encounter for follow-up examination after completed treatment for conditions other than malignant neoplasm: Secondary | ICD-10-CM

## 2021-03-06 DIAGNOSIS — R519 Headache, unspecified: Secondary | ICD-10-CM
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
## 2021-03-28 ENCOUNTER — Ambulatory Visit: Payer: Medicaid Other | Admitting: Family Medicine

## 2021-05-07 ENCOUNTER — Telehealth: Payer: Self-pay | Admitting: *Deleted

## 2021-05-07 NOTE — Telephone Encounter (Signed)
Transition Care Management Unsuccessful Follow-up Telephone Call  Date of discharge and from where:  05/06/2021 - [facility]  Attempts:  1st Attempt  Reason for unsuccessful TCM follow-up call:  Voice mail full

## 2021-05-08 NOTE — Telephone Encounter (Signed)
Transition Care Management Unsuccessful Follow-up Telephone Call  Date of discharge and from where:  05/06/2021 - [facility]  Attempts:  2nd Attempt  Reason for unsuccessful TCM follow-up call:  Voice mail full

## 2021-05-09 ENCOUNTER — Other Ambulatory Visit: Payer: Self-pay

## 2021-05-09 NOTE — Telephone Encounter (Signed)
Transition Care Management Unsuccessful Follow-up Telephone Call  Date of discharge and from where:  05/06/2021 - [facility]  Attempts:  3rd Attempt  Reason for unsuccessful TCM follow-up call:  Voice mail full

## 2021-05-10 ENCOUNTER — Other Ambulatory Visit: Payer: Self-pay

## 2021-05-10 ENCOUNTER — Ambulatory Visit (INDEPENDENT_AMBULATORY_CARE_PROVIDER_SITE_OTHER): Payer: Medicaid Other | Admitting: Sports Medicine

## 2021-05-10 ENCOUNTER — Encounter: Payer: Self-pay | Admitting: Sports Medicine

## 2021-05-10 ENCOUNTER — Ambulatory Visit (INDEPENDENT_AMBULATORY_CARE_PROVIDER_SITE_OTHER): Payer: Medicaid Other

## 2021-05-10 DIAGNOSIS — M79671 Pain in right foot: Secondary | ICD-10-CM

## 2021-05-10 DIAGNOSIS — M722 Plantar fascial fibromatosis: Secondary | ICD-10-CM | POA: Diagnosis not present

## 2021-05-10 DIAGNOSIS — M2140 Flat foot [pes planus] (acquired), unspecified foot: Secondary | ICD-10-CM
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
## 2021-05-14 ENCOUNTER — Other Ambulatory Visit: Payer: Self-pay | Admitting: Sports Medicine

## 2021-05-14 DIAGNOSIS — M722 Plantar fascial fibromatosis: Secondary | ICD-10-CM

## 2021-05-15 ENCOUNTER — Ambulatory Visit: Payer: Medicaid Other | Admitting: Sports Medicine

## 2021-05-23 ENCOUNTER — Telehealth: Payer: Self-pay | Admitting: Neurology

## 2021-05-23 ENCOUNTER — Ambulatory Visit (INDEPENDENT_AMBULATORY_CARE_PROVIDER_SITE_OTHER): Payer: Medicaid Other | Admitting: Neurology

## 2021-05-23 DIAGNOSIS — Z5329 Procedure and treatment not carried out because of patient's decision for other reasons: Secondary | ICD-10-CM

## 2021-05-23 NOTE — Telephone Encounter (Signed)
Patient no showed her new patient appointment in neurology today.  If she calls back she can reschedule with any physician however please inform her that we have many patients waiting for appointments and if she no-shows again or cancels within a short period of time she may be dismissed by our practice per office policy.  You can send her back to new referrals so that they can discuss this with her, please ask her not to schedule an appointment unless she would like to come in order to give other patients a chance to see [place].

## 2021-05-23 NOTE — Progress Notes (Signed)
Patient NO SHOWED her new patient appointment in neurology today.  If she calls back she can reschedule with any physician however please inform her that we have many patients waiting for appointments and if she no-shows again or cancels within a short period of time she may be dismissed by our practice per office policy.  You can send her back to new referrals so that they can discuss this with her, please ask her not to schedule an appointment unless she would like to come in order to give other patients a chance to see [place].

## 2021-05-25 ENCOUNTER — Ambulatory Visit: Payer: Medicaid Other | Admitting: Sports Medicine

## 2021-05-29 ENCOUNTER — Ambulatory Visit: Payer: Medicaid Other | Admitting: Family Medicine

## 2021-06-11 ENCOUNTER — Telehealth (HOSPITAL_COMMUNITY): Payer: Self-pay | Admitting: *Deleted

## 2021-06-11 NOTE — Telephone Encounter (Signed)
Patient called requesting to come to the day hospital for admission. Patient reports sickle cell pain in back, chest, legs and head. Patient reports that head pain feels "like he was hit by a car". Patient also reports SOB with chest pain. Patient reports taking Oxycodone at 12:00 midnight. Patient is currently at the ED in [facility] but he reports that he has been waiting for hours and has not been seen. [place], FNP notified. Provider can not oversee patient's care while he is at [facility]. Provider advised that patient get a family member to transport him to [facility] ED for work up.  Patient called and advised. Per patient, he has now been taken to the back at at [facility] ED and he will wait to see if they will provide treatment. If not, he will come to [facility] ED.

## 2021-06-13 ENCOUNTER — Telehealth: Payer: Self-pay

## 2021-06-13 NOTE — Telephone Encounter (Signed)
Transition Care Management Unsuccessful Follow-up Telephone Call  Date of discharge and from where:  06/12/2021-[facility] ED  Attempts:  1st Attempt  Reason for unsuccessful TCM follow-up call:  Left voice message

## 2021-06-14 NOTE — Telephone Encounter (Signed)
Transition Care Management Unsuccessful Follow-up Telephone Call  Date of discharge and from where:  06/12/2021-[facility] ED  Attempts:  2nd Attempt  Reason for unsuccessful TCM follow-up call:  Unable to reach patient

## 2021-06-15 NOTE — Telephone Encounter (Signed)
Transition Care Management Unsuccessful Follow-up Telephone Call  Date of discharge and from where:  06/12/2021[facility]  Attempts:  3rd Attempt  Reason for unsuccessful TCM follow-up call:  Left voice message

## 2021-12-03 ENCOUNTER — Telehealth: Payer: Self-pay

## 2021-12-03 NOTE — Telephone Encounter (Signed)
Transition Care Management Unsuccessful Follow-up Telephone Call  Date of discharge and from where:  12/02/2021 from [facility]  Attempts:  1st Attempt  Reason for unsuccessful TCM follow-up call:  Left voice message

## 2021-12-04 NOTE — Telephone Encounter (Signed)
Transition Care Management Follow-up Telephone Call Date of discharge and from where: 12/02/2021 from [facility] How have you been since you were released from the hospital? Pt stated that he is feeling better and did not have any questions or concerns.  Any questions or concerns? No  Items Reviewed: Did the pt receive and understand the discharge instructions provided? Yes  Medications obtained and verified? Yes  Other? No  Any new allergies since your discharge? No  Dietary orders reviewed? No Do you have support at home? Yes   Functional Questionnaire: (I = Independent and D = Dependent) ADLs: I  Bathing/Dressing- I  Meal Prep- I  Eating- I  Maintaining continence- I  Transferring/Ambulation- I  Managing Meds- I   Follow up appointments reviewed:  PCP Hospital f/u appt confirmed? Yes  Scheduled to see Julianne Handler, NP on 12/06/2021 @ 8:40am. Specialist Hospital f/u appt confirmed? No  Are transportation arrangements needed? No  If their condition worsens, is the pt aware to call PCP or go to the Emergency Dept.? Yes Was the patient provided with contact information for the PCP's office or ED? Yes Was to pt encouraged to call back with questions or concerns? Yes

## 2021-12-06 ENCOUNTER — Ambulatory Visit: Payer: Medicaid Other | Admitting: Family Medicine

## 2022-01-06 ENCOUNTER — Inpatient Hospital Stay (HOSPITAL_COMMUNITY)
Admission: EM | Admit: 2022-01-06 | Discharge: 2022-01-11 | DRG: 811 | Disposition: A | Discharge status: Home / Self Care | Payer: Medicaid Other | Attending: Internal Medicine | Admitting: Internal Medicine

## 2022-01-06 ENCOUNTER — Encounter (HOSPITAL_COMMUNITY): Payer: Self-pay | Admitting: Emergency Medicine

## 2022-01-06 ENCOUNTER — Inpatient Hospital Stay (HOSPITAL_COMMUNITY): Payer: Medicaid Other

## 2022-01-06 ENCOUNTER — Emergency Department (HOSPITAL_COMMUNITY): Payer: Medicaid Other

## 2022-01-06 DIAGNOSIS — Z8249 Family history of ischemic heart disease and other diseases of the circulatory system: Secondary | ICD-10-CM

## 2022-01-06 DIAGNOSIS — J4520 Mild intermittent asthma, uncomplicated: Secondary | ICD-10-CM | POA: Diagnosis present

## 2022-01-06 DIAGNOSIS — J9691 Respiratory failure, unspecified with hypoxia: Secondary | ICD-10-CM | POA: Diagnosis present

## 2022-01-06 DIAGNOSIS — Z79899 Other long term (current) drug therapy: Secondary | ICD-10-CM

## 2022-01-06 DIAGNOSIS — Z20822 Contact with and (suspected) exposure to covid-19: Secondary | ICD-10-CM | POA: Diagnosis present

## 2022-01-06 DIAGNOSIS — F1190 Opioid use, unspecified, uncomplicated: Secondary | ICD-10-CM

## 2022-01-06 DIAGNOSIS — J9601 Acute respiratory failure with hypoxia: Secondary | ICD-10-CM | POA: Diagnosis present

## 2022-01-06 DIAGNOSIS — F1120 Opioid dependence, uncomplicated: Secondary | ICD-10-CM | POA: Diagnosis present

## 2022-01-06 DIAGNOSIS — D72829 Elevated white blood cell count, unspecified: Secondary | ICD-10-CM | POA: Diagnosis present

## 2022-01-06 DIAGNOSIS — G894 Chronic pain syndrome: Secondary | ICD-10-CM | POA: Diagnosis present

## 2022-01-06 DIAGNOSIS — R0902 Hypoxemia: Secondary | ICD-10-CM

## 2022-01-06 DIAGNOSIS — J189 Pneumonia, unspecified organism: Secondary | ICD-10-CM | POA: Diagnosis present

## 2022-01-06 DIAGNOSIS — Z888 Allergy status to other drugs, medicaments and biological substances status: Secondary | ICD-10-CM

## 2022-01-06 DIAGNOSIS — Z832 Family history of diseases of the blood and blood-forming organs and certain disorders involving the immune mechanism: Secondary | ICD-10-CM

## 2022-01-06 DIAGNOSIS — D5700 Hb-SS disease with crisis, unspecified: Principal | ICD-10-CM | POA: Diagnosis present

## 2022-01-06 DIAGNOSIS — Z885 Allergy status to narcotic agent status: Secondary | ICD-10-CM | POA: Diagnosis not present

## 2022-01-06 DIAGNOSIS — Z8701 Personal history of pneumonia (recurrent): Secondary | ICD-10-CM

## 2022-01-06 DIAGNOSIS — D571 Sickle-cell disease without crisis: Secondary | ICD-10-CM

## 2022-01-06 DIAGNOSIS — I10 Essential (primary) hypertension: Secondary | ICD-10-CM | POA: Diagnosis present

## 2022-01-06 LAB — CBC WITH DIFFERENTIAL/PLATELET
Abs Immature Granulocytes: 0.99 10*3/uL — ABNORMAL HIGH (ref 0.00–0.07)
Basophils Absolute: 0.2 10*3/uL — ABNORMAL HIGH (ref 0.0–0.1)
Basophils Relative: 1 %
Eosinophils Absolute: 0.2 10*3/uL (ref 0.0–0.5)
Eosinophils Relative: 1 %
HCT: 25.3 % — ABNORMAL LOW (ref 39.0–52.0)
Hemoglobin: 9.3 g/dL — ABNORMAL LOW (ref 13.0–17.0)
Immature Granulocytes: 7 %
Lymphocytes Relative: 40 %
Lymphs Abs: 5.7 10*3/uL — ABNORMAL HIGH (ref 0.7–4.0)
MCH: 30.4 pg (ref 26.0–34.0)
MCHC: 36.8 g/dL — ABNORMAL HIGH (ref 30.0–36.0)
MCV: 82.7 fL (ref 80.0–100.0)
Monocytes Absolute: 1.3 10*3/uL — ABNORMAL HIGH (ref 0.1–1.0)
Monocytes Relative: 9 %
Neutro Abs: 6 10*3/uL (ref 1.7–7.7)
Neutrophils Relative %: 42 %
Platelets: 380 10*3/uL (ref 150–400)
RBC: 3.06 MIL/uL — ABNORMAL LOW (ref 4.22–5.81)
RDW: 20.4 % — ABNORMAL HIGH (ref 11.5–15.5)
WBC: 14.3 10*3/uL — ABNORMAL HIGH (ref 4.0–10.5)
nRBC: 1 % — ABNORMAL HIGH (ref 0.0–0.2)

## 2022-01-06 LAB — TROPONIN I (HIGH SENSITIVITY)
Troponin I (High Sensitivity): 8 ng/L (ref <18)
Troponin I (High Sensitivity): 9 ng/L (ref <18)

## 2022-01-06 LAB — COMPREHENSIVE METABOLIC PANEL
ALT: 33 U/L (ref 0–44)
AST: 58 U/L — ABNORMAL HIGH (ref 15–41)
Albumin: 4.4 g/dL (ref 3.5–5.0)
Alkaline Phosphatase: 72 U/L (ref 38–126)
Anion gap: 11 (ref 5–15)
BUN: 11 mg/dL (ref 6–20)
CO2: 22 mmol/L (ref 22–32)
Calcium: 9.1 mg/dL (ref 8.9–10.3)
Chloride: 107 mmol/L (ref 98–111)
Creatinine, Ser: 0.53 mg/dL — ABNORMAL LOW (ref 0.61–1.24)
GFR, Estimated: >60 mL/min (ref >60)
Glucose, Bld: 92 mg/dL (ref 70–99)
Potassium: 4.1 mmol/L (ref 3.5–5.1)
Sodium: 140 mmol/L (ref 135–145)
Total Bilirubin: 3.8 mg/dL — ABNORMAL HIGH (ref 0.3–1.2)
Total Protein: 7.3 g/dL (ref 6.5–8.1)

## 2022-01-06 LAB — RESP PANEL BY RT-PCR (FLU A&B, COVID) ARPGX2
Influenza A by PCR: NEGATIVE
Influenza B by PCR: NEGATIVE
SARS Coronavirus 2 by RT PCR: NEGATIVE

## 2022-01-06 LAB — RETICULOCYTES
Immature Retic Fract: 52.7 % — ABNORMAL HIGH (ref 2.3–15.9)
RBC.: 3.07 MIL/uL — ABNORMAL LOW (ref 4.22–5.81)
Retic Count, Absolute: 165.8 10*3/uL (ref 19.0–186.0)
Retic Ct Pct: 5.4 % — ABNORMAL HIGH (ref 0.4–3.1)

## 2022-01-06 MED ORDER — ACETAMINOPHEN 325 MG PO TABS
650.0000 mg | ORAL_TABLET | Freq: Four times a day (QID) | ORAL | Status: DC | PRN
  Administered 2022-01-08: 650 mg via ORAL
  Filled 2022-01-06: qty 2

## 2022-01-06 MED ORDER — ENOXAPARIN SODIUM 40 MG/0.4ML IJ SOSY
40.0000 mg | PREFILLED_SYRINGE | INTRAMUSCULAR | Status: DC
  Filled 2022-01-06: qty 0.4

## 2022-01-06 MED ORDER — SENNOSIDES-DOCUSATE SODIUM 8.6-50 MG PO TABS
1.0000 | ORAL_TABLET | Freq: Two times a day (BID) | ORAL | Status: DC
  Administered 2022-01-08 – 2022-01-11 (×6): 1 via ORAL
  Filled 2022-01-06 (×8): qty 1

## 2022-01-06 MED ORDER — HYDROMORPHONE HCL 1 MG/ML IJ SOLN
1.0000 mg | INTRAMUSCULAR | Status: DC | PRN
  Administered 2022-01-06 (×2): 1 mg via INTRAVENOUS
  Filled 2022-01-06 (×2): qty 1

## 2022-01-06 MED ORDER — SODIUM CHLORIDE 0.45 % IV SOLN: INTRAVENOUS | Status: DC

## 2022-01-06 MED ORDER — POLYETHYLENE GLYCOL 3350 17 G PO PACK
17.0000 g | PACK | Freq: Every day | ORAL | Status: DC | PRN
  Administered 2022-01-10: 17 g via ORAL
  Filled 2022-01-06: qty 1

## 2022-01-06 MED ORDER — KETOROLAC TROMETHAMINE 15 MG/ML IJ SOLN
15.0000 mg | Freq: Four times a day (QID) | INTRAMUSCULAR | Status: AC
  Administered 2022-01-06 – 2022-01-11 (×20): 15 mg via INTRAVENOUS
  Filled 2022-01-06 (×20): qty 1

## 2022-01-06 MED ORDER — HYDROXYUREA 500 MG PO CAPS
1500.0000 mg | ORAL_CAPSULE | Freq: Every day | ORAL | Status: DC
  Filled 2022-01-06 (×2): qty 3

## 2022-01-06 MED ORDER — HYDROXYUREA 300 MG PO CAPS
1600.0000 mg | ORAL_CAPSULE | Freq: Every day | ORAL | Status: DC
Start: 2022-01-06 — End: 2022-01-06

## 2022-01-06 MED ORDER — HYDROMORPHONE HCL 2 MG/ML IJ SOLN
2.0000 mg | INTRAMUSCULAR | Status: AC
  Administered 2022-01-06: 2 mg via INTRAVENOUS
  Filled 2022-01-06: qty 1

## 2022-01-06 MED ORDER — SODIUM CHLORIDE 0.9 % IV SOLN
12.5000 mg | Freq: Once | INTRAVENOUS | Status: AC
  Administered 2022-01-06: 12.5 mg via INTRAVENOUS
  Filled 2022-01-06: qty 12.5

## 2022-01-06 MED ORDER — SODIUM CHLORIDE (PF) 0.9 % IJ SOLN
INTRAMUSCULAR | Status: AC
  Filled 2022-01-06: qty 50

## 2022-01-06 MED ORDER — DIPHENHYDRAMINE HCL 25 MG PO CAPS
25.0000 mg | ORAL_CAPSULE | Freq: Once | ORAL | Status: AC
  Administered 2022-01-06: 25 mg via ORAL
  Filled 2022-01-06: qty 1

## 2022-01-06 MED ORDER — KETOROLAC TROMETHAMINE 15 MG/ML IJ SOLN
15.0000 mg | INTRAMUSCULAR | Status: AC
  Administered 2022-01-06: 15 mg via INTRAVENOUS
  Filled 2022-01-06: qty 1

## 2022-01-06 MED ORDER — NALOXONE HCL 0.4 MG/ML IJ SOLN: 0.4000 mg | INTRAMUSCULAR | Status: DC | PRN

## 2022-01-06 MED ORDER — HYDROMORPHONE 1 MG/ML IV SOLN
INTRAVENOUS | Status: DC
  Administered 2022-01-07: 1 mg via INTRAVENOUS
  Administered 2022-01-07: 3 mg via INTRAVENOUS
  Administered 2022-01-07: 6 mg via INTRAVENOUS
  Administered 2022-01-07: 3.5 mg via INTRAVENOUS
  Administered 2022-01-07: 1.5 mg via INTRAVENOUS
  Administered 2022-01-07: 2 mg via INTRAVENOUS
  Administered 2022-01-08: 1 mg via INTRAVENOUS
  Administered 2022-01-08 (×2): 3 mg via INTRAVENOUS
  Administered 2022-01-08: 1 mg via INTRAVENOUS
  Administered 2022-01-08: 2.7 mg via INTRAVENOUS
  Administered 2022-01-08: 0.5 mg via INTRAVENOUS
  Administered 2022-01-09: 3.5 mg via INTRAVENOUS
  Administered 2022-01-09: 1 mg via INTRAVENOUS
  Administered 2022-01-09: 1.5 mL via INTRAVENOUS
  Administered 2022-01-09: 0 mg via INTRAVENOUS
  Filled 2022-01-06 (×2): qty 30

## 2022-01-06 MED ORDER — OXYCODONE HCL 5 MG PO TABS
15.0000 mg | ORAL_TABLET | Freq: Once | ORAL | Status: AC
  Administered 2022-01-06: 15 mg via ORAL
  Filled 2022-01-06: qty 3

## 2022-01-06 MED ORDER — DIPHENHYDRAMINE HCL 25 MG PO CAPS: 25.0000 mg | ORAL_CAPSULE | ORAL | Status: DC | PRN

## 2022-01-06 MED ORDER — IOHEXOL 350 MG/ML SOLN
100.0000 mL | Freq: Once | INTRAVENOUS | Status: AC | PRN
  Administered 2022-01-06: 80 mL via INTRAVENOUS

## 2022-01-06 MED ORDER — LOSARTAN POTASSIUM 50 MG PO TABS
50.0000 mg | ORAL_TABLET | Freq: Every day | ORAL | Status: DC
  Administered 2022-01-06 – 2022-01-11 (×6): 50 mg via ORAL
  Filled 2022-01-06 (×6): qty 1

## 2022-01-06 MED ORDER — SODIUM CHLORIDE 0.9% FLUSH: 9.0000 mL | INTRAVENOUS | Status: DC | PRN

## 2022-01-06 MED ORDER — FOLIC ACID 1 MG PO TABS
1.0000 mg | ORAL_TABLET | Freq: Every day | ORAL | Status: DC
Start: 2022-01-06 — End: 2022-01-11
  Administered 2022-01-06 – 2022-01-11 (×6): 1 mg via ORAL
  Filled 2022-01-06 (×6): qty 1

## 2022-01-06 NOTE — ED Triage Notes (Signed)
Patient c/o sickle cell pain all over with SOB x3 hours.

## 2022-01-06 NOTE — ED Notes (Signed)
Pt moaning and writhing in pain, requesting pain medicine. A/ox4, states he is having a sickle cell crisis with pain all over.

## 2022-01-06 NOTE — ED Provider Notes (Signed)
[place] DEPT Provider Note   CSN: [id number] Arrival date & time: 01/06/22  [id number]     History  Chief Complaint  Patient presents with   Sickle Cell Pain Crisis    [name] Hill is a 21 y.o. male with PMHx sickle cell who presents to the ED today via personal vehicle who presents to the ED today with complaint of "pain all over."  Patient states that he is having back pain, leg pain, chest pain, and SOB. He states that this is all typical for him with sickle cell crisis. He last took regular home medication about 4-6 hours ago. Pt denies fevers, chills however does mention a new cough.   Per chart review pt recently admitted at [facility] last month for acute chest syndrome.   The history is provided by the patient and medical records.      Home Medications Prior to Admission medications   Medication Sig Start Date End Date Taking? Authorizing Provider  acetaminophen (TYLENOL) 500 MG tablet Take 1,000 mg by mouth daily as needed for mild pain.   Yes [provider]  folic acid (FOLVITE) 1 MG tablet Take 1 tablet (1 mg total) by mouth daily. 02/04/20  Yes Dorena Dew, FNP  ibuprofen (ADVIL) 200 MG tablet Take 400 mg by mouth every 4 (four) hours as needed for mild pain.   Yes [provider]  losartan (COZAAR) 50 MG tablet Take 1 tablet (50 mg total) by mouth daily. 01/26/21  Yes Azzie Glatter, FNP  Oxycodone HCl 10 MG TABS Take 1 tablet (10 mg total) by mouth every 4 (four) hours as needed (pain). 01/26/21  Yes Azzie Glatter, FNP  hydroxyurea (DROXIA) 400 MG capsule Take 4 capsules (1,600 mg total) by mouth daily. Patient not taking: Reported on 01/06/2022 10/17/20   Azzie Glatter, FNP      Allergies    Lisinopril and Toradol [ketorolac tromethamine]    Review of Systems   Review of Systems  Constitutional:  Negative for chills and fever.  Respiratory:  Positive for cough and shortness of breath.   Cardiovascular:   Positive for chest pain.  Gastrointestinal:  Negative for diarrhea, nausea and vomiting.  Musculoskeletal:  Positive for arthralgias and myalgias.  All other systems reviewed and are negative.  Physical Exam Updated Vital Signs BP (!) 109/52    Pulse 85    Temp (!) 97 F (36.1 C)    Resp 17    Ht 5\' 7"  (1.702 m)    Wt 90.7 kg    SpO2 92%    BMI 31.32 kg/m  Physical Exam Vitals and nursing note reviewed.  Constitutional:      Appearance: He is not ill-appearing or diaphoretic.     Comments: Uncomfortable appearing male  HENT:     Head: Normocephalic and atraumatic.  Eyes:     Conjunctiva/sclera: Conjunctivae normal.  Cardiovascular:     Rate and Rhythm: Normal rate and regular rhythm.  Pulmonary:     Effort: Pulmonary effort is normal.     Breath sounds: Normal breath sounds. No wheezing, rhonchi or rales.     Comments: Pt desatted to 85% while coughing. Place on 2L [place] for comfort.  Abdominal:     Palpations: Abdomen is soft.     Tenderness: There is no abdominal tenderness.  Musculoskeletal:     Cervical back: Neck supple.  Skin:    General: Skin is warm and dry.  Neurological:  Mental Status: He is alert.    ED Results / Procedures / Treatments   Labs (all labs ordered are listed, but only abnormal results are displayed) Labs Reviewed  CBC WITH DIFFERENTIAL/PLATELET - Abnormal; Notable for the following components:      Result Value   WBC 14.3 (*)    RBC 3.06 (*)    Hemoglobin 9.3 (*)    HCT 25.3 (*)    MCHC 36.8 (*)    RDW 20.4 (*)    nRBC 1.0 (*)    Lymphs Abs 5.7 (*)    Monocytes Absolute 1.3 (*)    Basophils Absolute 0.2 (*)    Abs Immature Granulocytes 0.99 (*)    All other components within normal limits  RETICULOCYTES - Abnormal; Notable for the following components:   Retic Ct Pct 5.4 (*)    RBC. 3.07 (*)    Immature Retic Fract 52.7 (*)    All other components within normal limits  COMPREHENSIVE METABOLIC PANEL - Abnormal; Notable for the  following components:   Creatinine, Ser 0.53 (*)    AST 58 (*)    Total Bilirubin 3.8 (*)    All other components within normal limits  RESP PANEL BY RT-PCR (FLU A&B, COVID) [id number]  TROPONIN I (HIGH SENSITIVITY)  TROPONIN I (HIGH SENSITIVITY)    EKG EKG Interpretation  Date/Time:  Sunday January 06 2022 09:32:31 EST Ventricular Rate:  99 PR Interval:  214 QRS Duration: 95 QT Interval:  331 QTC Calculation: 425 R Axis:   78 Text Interpretation: Sinus rhythm Prolonged PR interval Probable left ventricular hypertrophy Baseline wander in lead(s) V3 No significant change since last tracing Confirmed by Isla Pence [phone]) on 01/06/2022 12:32:31 PM  Radiology CT Angio Chest PE W/Cm &/Or Wo Cm  Result Date: 01/06/2022 CLINICAL DATA:  Sickle cell crisis.  Chest pain. EXAM: CT ANGIOGRAPHY CHEST WITH CONTRAST TECHNIQUE: Multidetector CT imaging of the chest was performed using the standard protocol during bolus administration of intravenous contrast. Multiplanar CT image reconstructions and MIPs were obtained to evaluate the vascular anatomy. RADIATION DOSE REDUCTION: This exam was performed according to the departmental dose-optimization program which includes automated exposure control, adjustment of the mA and/or kV according to patient size and/or use of iterative reconstruction technique. CONTRAST:  43mL OMNIPAQUE IOHEXOL 350 MG/ML SOLN COMPARISON:  12/01/2021 FINDINGS: Cardiovascular: Central pulmonary arteries are relatively well opacified. More peripheral pulmonary arteries show heterogeneous opacification limiting their assessment for pulmonary emboli. Allowing for this mild limitation, there is no evidence of a pulmonary embolism. Heart is mildly enlarged. No pericardial effusion. Prominent main pulmonary artery measuring 3.4 cm, stable. Normal aorta and arch branch vessels. Mediastinum/Nodes: No enlarged mediastinal, hilar, or axillary lymph nodes. Thyroid gland, trachea, and esophagus  demonstrate no significant findings. Lungs/Pleura: Relatively low lung volumes. There are peribronchovascular opacities in both lower lobes, minimally in the dependent left upper lobe, consistent with atelectasis. Infection is not excluded and should be considered in the proper clinical setting. Remainder of the lungs is clear. No pleural effusion or pneumothorax. 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 No acute findings in the visualized upper abdomen. Musculoskeletal: Advanced skeletal changes sickle cell disease, stable from the prior CT. No acute fracture. Review of the MIP images confirms the above findings. IMPRESSION: 1. No evidence of a pulmonary embolism allowing for the mild technical limitation as detailed above. 2. Dependent peribronchovascular opacities in the lower lobes, minimally in the left upper lobe, likely atelectasis. Consider pneumonia if  there are consistent clinical findings. 3. Spleen and skeletal findings reflecting chronic changes of sickle cell disease. Electronically Signed   By: Lajean Manes M.D.   On: 01/06/2022 14:38   DG Chest Port 1 View  Result Date: 01/06/2022 CLINICAL DATA:  Cough and shortness of breath. EXAM: PORTABLE CHEST 1 VIEW COMPARISON:  06/11/2021 FINDINGS: 1024 hours. The lungs are clear without focal pneumonia, edema, pneumothorax or pleural effusion. Cardiopericardial silhouette is at upper limits of normal for size. The visualized bony structures of the thorax show no acute abnormality. Telemetry leads overlie the chest. IMPRESSION: No active disease. Electronically Signed   By: Misty Stanley M.D.   On: 01/06/2022 10:58    Procedures Procedures    Medications Ordered in ED Medications  0.45 % sodium chloride infusion ( Intravenous New Bag/Given 01/06/22 0949)  diphenhydrAMINE (BENADRYL) capsule 25 mg (has no administration in time range)  HYDROmorphone (DILAUDID) injection 1 mg (1 mg Intravenous Given 01/06/22  1425)  HYDROmorphone (DILAUDID) injection 2 mg (2 mg Intravenous Given 01/06/22 0950)  HYDROmorphone (DILAUDID) injection 2 mg (2 mg Intravenous Given 01/06/22 1038)  HYDROmorphone (DILAUDID) injection 2 mg (2 mg Intravenous Given 01/06/22 1138)  ketorolac (TORADOL) 15 MG/ML injection 15 mg (15 mg Intravenous Given 01/06/22 0949)  diphenhydrAMINE (BENADRYL) 12.5 mg in sodium chloride 0.9 % 50 mL IVPB (0 mg Intravenous Stopped 01/06/22 1029)  oxyCODONE (Oxy IR/ROXICODONE) immediate release tablet 15 mg (15 mg Oral Given 01/06/22 1241)  diphenhydrAMINE (BENADRYL) capsule 25 mg (25 mg Oral Given 01/06/22 1241)  iohexol (OMNIPAQUE) 350 MG/ML injection 100 mL (80 mLs Intravenous Contrast Given 01/06/22 1358)  sodium chloride (PF) 0.9 % injection (  Given by Other 01/06/22 1425)    ED Course/ Medical Decision Making/ A&P                           Medical Decision Making 21 year old male with a history of sickle cell anemia presents to the ED today with pain all over including chest pain, shortness of breath, cough.  Recently admitted last month at [facility] for acute chest syndrome.  On arrival to the ED vitals are stable.  Patient initially satting 92% on room air however he had a coughing episode and desatted to 85%.  Placed on 2 L for comfort.  Temperature not currently documented, nursing staff to collect.  Patient otherwise appears uncomfortable at this time.  We will plan to work-up for sickle cell crisis including concern for acute chest syndrome with labs CBC, CMP, reticulocytes, troponin, EKG, chest x-ray, COVID/flu testing.  Patient did have a CTA last month during admission which was negative for PE.   Problems Addressed: Hypoxia: acute illness or injury Sickle cell pain crisis [facility]): acute illness or injury    Details: On reevaluation pt reports only slight improvement in pain after 3 rounds IV dilaudid. Additional oral dose of oxycodone provided. Pt continues to be hypoxic however  without supplemental O2 which he is not on at home. Given same feel pt requires admission.  Amount and/or Complexity of Data Reviewed Labs: ordered.    Details: CBC with mild leukocytosis 14,300. Hgb stable at 9.3.  CMP with creatinine 0.83. T bili 3.8 (baseline). No other electrolyte abnormalities. COVID and flu negative Troponin 8 Radiology: ordered.    Details: CXR clear  Risk Prescription drug management. Decision regarding hospitalization. Risk Details: Discussed case with [place] Hollis, NP, who will admit patient. Recommends CTA at this time  to rule out pneumonia, acute chest, and PE.           Final Clinical Impression(s) / ED Diagnoses Final diagnoses:  Sickle cell pain crisis [facility])  Hypoxia    Rx / DC Orders ED Discharge Orders     None         Eustaquio Maize, PA-C 01/06/22 1515    Isla Pence, MD 01/06/22 1539

## 2022-01-06 NOTE — ED Notes (Signed)
Pt is still in a lot of pain, moaning. Pt informed this RN cannot give any more pain medicine for approximately 35 minutes. Pt informed he is being transferred upstairs where there is a PCA pump waiting
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
## 2022-01-07 DIAGNOSIS — D5700 Hb-SS disease with crisis, unspecified: Principal | ICD-10-CM

## 2022-01-07 LAB — BASIC METABOLIC PANEL
Anion gap: 8 (ref 5–15)
BUN: 10 mg/dL (ref 6–20)
CO2: 25 mmol/L (ref 22–32)
Calcium: 8.8 mg/dL — ABNORMAL LOW (ref 8.9–10.3)
Chloride: 102 mmol/L (ref 98–111)
Creatinine, Ser: 0.63 mg/dL (ref 0.61–1.24)
Glucose, Bld: 111 mg/dL — ABNORMAL HIGH (ref 70–99)
Potassium: 3.8 mmol/L (ref 3.5–5.1)
Sodium: 135 mmol/L (ref 135–145)

## 2022-01-07 LAB — CBC
HCT: 23.6 % — ABNORMAL LOW (ref 39.0–52.0)
Hemoglobin: 8.9 g/dL — ABNORMAL LOW (ref 13.0–17.0)
MCH: 30.2 pg (ref 26.0–34.0)
MCHC: 37.7 g/dL — ABNORMAL HIGH (ref 30.0–36.0)
MCV: 80 fL (ref 80.0–100.0)
Platelets: 358 10*3/uL (ref 150–400)
RBC: 2.95 MIL/uL — ABNORMAL LOW (ref 4.22–5.81)
RDW: 20.7 % — ABNORMAL HIGH (ref 11.5–15.5)
WBC: 15.1 10*3/uL — ABNORMAL HIGH (ref 4.0–10.5)
nRBC: 2.1 % — ABNORMAL HIGH (ref 0.0–0.2)

## 2022-01-07 LAB — HIV ANTIBODY (ROUTINE TESTING W REFLEX): HIV Screen 4th Generation wRfx: NONREACTIVE

## 2022-01-07 MED ORDER — SODIUM CHLORIDE 0.9 % IV SOLN
1.0000 g | INTRAVENOUS | Status: DC
  Administered 2022-01-07 – 2022-01-11 (×5): 1 g via INTRAVENOUS
  Filled 2022-01-07 (×5): qty 10

## 2022-01-07 MED ORDER — OXYCODONE HCL 5 MG PO TABS
10.0000 mg | ORAL_TABLET | ORAL | Status: DC | PRN
  Administered 2022-01-07: 10 mg via ORAL
  Filled 2022-01-07: qty 2

## 2022-01-07 MED ORDER — HYDROMORPHONE HCL 1 MG/ML IJ SOLN
1.0000 mg | Freq: Once | INTRAMUSCULAR | Status: AC
  Administered 2022-01-07: 1 mg via INTRAVENOUS
  Filled 2022-01-07: qty 1

## 2022-01-07 NOTE — Progress Notes (Signed)
Date and time results received: 01/07/22 0643 (use smartphrase ".now" to insert current time)  Test: Creatinine Critical Value: 0.63  Name of Provider Notified: Audrea Muscat NP  Orders Received? Or Actions Taken?:

## 2022-01-07 NOTE — Progress Notes (Signed)
Subjective: [name] Hill is a 21 year old male with a medical history significant for sickle cell disease, chronic pain syndrome, opiate dependence and tolerance, and history of anemia of chronic disease that was admitted for sickle cell pain crisis.  Patient continues to complain of pain primarily to upper and lower extremities.  Pain intensity is 6/10.  Patient denies any chest pain, shortness of breath, urinary symptoms, nausea, vomiting, or diarrhea.  Objective:  Vital signs in last 24 hours:  Vitals:   01/07/22 0728 01/07/22 1004 01/07/22 1156 01/07/22 1330  BP:  125/63  135/75  Pulse:  74  74  Resp: 14 16 20 16   Temp:  98.6 F (37 C)  98.7 F (37.1 C)  TempSrc:  Oral  Oral  SpO2: 95% 100% 100% 99%  Weight:      Height:        Intake/Output from previous day:   Intake/Output Summary (Last 24 hours) at 01/07/2022 1333 Last data filed at 01/07/2022 1004 Gross per 24 hour  Intake 1107.7 ml  Output --  Net 1107.7 ml    Physical Exam: General: Alert, awake, oriented x3, in no acute distress.  HEENT: [place]/AT [place], EOMI Neck: Trachea midline,  no masses, no thyromegal,y no JVD, no carotid bruit OROPHARYNX:  Moist, No exudate/ erythema/lesions.  Heart: Regular rate and rhythm, without murmurs, rubs, gallops, PMI non-displaced, no heaves or thrills on palpation.  Lungs: Clear to auscultation, no wheezing or rhonchi noted. No increased vocal fremitus resonant to percussion  Abdomen: Soft, nontender, nondistended, positive bowel sounds, no masses no hepatosplenomegaly noted..  Neuro: No focal neurological deficits noted cranial nerves II through XII grossly intact. DTRs 2+ bilaterally upper and lower extremities. Strength 5 out of 5 in bilateral upper and lower extremities. Musculoskeletal: No warm swelling or erythema around joints, no spinal tenderness noted. Psychiatric: Patient alert and oriented x3, good insight and cognition, good recent to remote recall. Lymph node survey:  No cervical axillary or inguinal lymphadenopathy noted.  Lab Results:  Basic Metabolic Panel:    Component Value Date/Time   NA 135 01/07/2022 0502   NA 142 06/26/2018 1416   K 3.8 01/07/2022 0502   CL 102 01/07/2022 0502   CO2 25 01/07/2022 0502   BUN 10 01/07/2022 0502   BUN 7 06/26/2018 1416   CREATININE 0.63 01/07/2022 0502   GLUCOSE 111 (H) 01/07/2022 0502   CALCIUM 8.8 (L) 01/07/2022 0502   CBC:    Component Value Date/Time   WBC 15.1 (H) 01/07/2022 0502   HGB 8.9 (L) 01/07/2022 0502   HGB 9.6 (L) 06/26/2018 1416   HCT 23.6 (L) 01/07/2022 0502   HCT 28.5 (L) 06/26/2018 1416   PLT 358 01/07/2022 0502   PLT 405 06/26/2018 1416   MCV 80.0 01/07/2022 0502   MCV 92 06/26/2018 1416   NEUTROABS 6.0 01/06/2022 0935   NEUTROABS 3.7 06/26/2018 1416   LYMPHSABS 5.7 (H) 01/06/2022 0935   LYMPHSABS 3.3 (H) 06/26/2018 1416   MONOABS 1.3 (H) 01/06/2022 0935   EOSABS 0.2 01/06/2022 0935   EOSABS 0.2 06/26/2018 1416   BASOSABS 0.2 (H) 01/06/2022 0935   BASOSABS 0.1 06/26/2018 1416    Recent Results (from the past 240 hour(s))  Resp Panel by RT-PCR (Flu A&B, Covid) Nasopharyngeal Swab     Status: None   Collection Time: 01/06/22  9:38 AM   Specimen: Nasopharyngeal Swab; Nasopharyngeal(NP) swabs in vial transport medium  Result Value Ref Range Status   SARS Coronavirus 2 by RT PCR  NEGATIVE NEGATIVE Final    Comment: (NOTE) SARS-CoV-2 target nucleic acids are NOT DETECTED.  The SARS-CoV-2 RNA is generally detectable in upper respiratory specimens during the acute phase of infection. The lowest concentration of SARS-CoV-2 viral copies this assay can detect is 138 copies/mL. A negative result does not preclude SARS-Cov-2 infection and should not be used as the sole basis for treatment or other patient management decisions. A negative result may occur with  improper specimen collection/handling, submission of specimen other than nasopharyngeal swab, presence of viral  mutation(s) within the areas targeted by this assay, and inadequate number of viral copies(<138 copies/mL). A negative result must be combined with clinical observations, patient history, and epidemiological information. The expected result is Negative.  Fact Sheet for Patients:  EntrepreneurPulse.com.au  Fact Sheet for Healthcare Providers:  IncredibleEmployment.be  This test is no t yet approved or cleared by the [place] FDA and  has been authorized for detection and/or diagnosis of SARS-CoV-2 by FDA under an Emergency Use Authorization (EUA). This EUA will remain  in effect (meaning this test can be used) for the duration of the COVID-19 declaration under Section 564(b)(1) of the Act, 21 U.S.C.section 360bbb-3(b)(1), unless the authorization is terminated  or revoked sooner.       Influenza A by PCR NEGATIVE NEGATIVE Final   Influenza B by PCR NEGATIVE NEGATIVE Final    Comment: (NOTE) The Xpert Xpress SARS-CoV-2/FLU/RSV plus assay is intended as an aid in the diagnosis of influenza from Nasopharyngeal swab specimens and should not be used as a sole basis for treatment. Nasal washings and aspirates are unacceptable for Xpert Xpress SARS-CoV-2/FLU/RSV testing.  Fact Sheet for Patients: EntrepreneurPulse.com.au  Fact Sheet for Healthcare Providers: IncredibleEmployment.be  This test is not yet approved or cleared by the [place] FDA and has been authorized for detection and/or diagnosis of SARS-CoV-2 by FDA under an Emergency Use Authorization (EUA). This EUA will remain in effect (meaning this test can be used) for the duration of the COVID-19 declaration under Section 564(b)(1) of the Act, 21 U.S.C. section 360bbb-3(b)(1), unless the authorization is terminated or revoked.  Performed at [facility], [place] [address] [place], [place] [address]      Studies/Results: CT Angio Chest PE W/Cm &/Or Wo Cm  Result Date: 01/06/2022 CLINICAL DATA:  Sickle cell crisis.  Chest pain. EXAM: CT ANGIOGRAPHY CHEST WITH CONTRAST TECHNIQUE: Multidetector CT imaging of the chest was performed using the standard protocol during bolus administration of intravenous contrast. Multiplanar CT image reconstructions and MIPs were obtained to evaluate the vascular anatomy. RADIATION DOSE REDUCTION: This exam was performed according to the departmental dose-optimization program which includes automated exposure control, adjustment of the mA and/or kV according to patient size and/or use of iterative reconstruction technique. CONTRAST:  50mL OMNIPAQUE IOHEXOL 350 MG/ML SOLN COMPARISON:  12/01/2021 FINDINGS: Cardiovascular: Central pulmonary arteries are relatively well opacified. More peripheral pulmonary arteries show heterogeneous opacification limiting their assessment for pulmonary emboli. Allowing for this mild limitation, there is no evidence of a pulmonary embolism. Heart is mildly enlarged. No pericardial effusion. Prominent main pulmonary artery measuring 3.4 cm, stable. Normal aorta and arch branch vessels. Mediastinum/Nodes: No enlarged mediastinal, hilar, or axillary lymph nodes. Thyroid gland, trachea, and esophagus demonstrate no significant findings. Lungs/Pleura: Relatively low lung volumes. There are peribronchovascular opacities in both lower lobes, minimally in the dependent left upper lobe, consistent with atelectasis. Infection is not excluded and should be considered in the proper clinical setting. Remainder of the lungs  is clear. No pleural effusion or pneumothorax. 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 No acute findings in the visualized upper abdomen. Musculoskeletal: Advanced skeletal changes sickle cell disease, stable from the prior CT. No acute fracture. Review of the MIP images confirms the above findings.  IMPRESSION: 1. No evidence of a pulmonary embolism allowing for the mild technical limitation as detailed above. 2. Dependent peribronchovascular opacities in the lower lobes, minimally in the left upper lobe, likely atelectasis. Consider pneumonia if there are consistent clinical findings. 3. Spleen and skeletal findings reflecting chronic changes of sickle cell disease. Electronically Signed   By: Lajean Manes M.D.   On: 01/06/2022 14:38   DG Chest Port 1 View  Result Date: 01/06/2022 CLINICAL DATA:  Cough and shortness of breath. EXAM: PORTABLE CHEST 1 VIEW COMPARISON:  06/11/2021 FINDINGS: 1024 hours. The lungs are clear without focal pneumonia, edema, pneumothorax or pleural effusion. Cardiopericardial silhouette is at upper limits of normal for size. The visualized bony structures of the thorax show no acute abnormality. Telemetry leads overlie the chest. IMPRESSION: No active disease. Electronically Signed   By: Misty Stanley M.D.   On: 01/06/2022 10:58    Medications: Scheduled Meds:  enoxaparin (LOVENOX) injection  40 mg Subcutaneous [id number]   folic acid  1 mg Oral Daily   HYDROmorphone   Intravenous Q4H   ketorolac  15 mg Intravenous Q6H   losartan  50 mg Oral Daily   senna-docusate  1 tablet Oral BID   Continuous Infusions:  sodium chloride 75 mL/hr at 01/07/22 0302   cefTRIAXone (ROCEPHIN)  IV 1 g (01/07/22 0949)   PRN Meds:.acetaminophen, diphenhydrAMINE, naloxone **AND** sodium chloride flush, oxyCODONE, polyethylene glycol  Consultants: None  Procedures: None  Antibiotics: Ceftriaxone  Assessment/Plan: Principal Problem:   Sickle cell pain crisis (HCC) Active Problems:   Leukocytosis   Hypertension   Chronic pain syndrome  Sickle cell disease with pain crisis: Continue IV fluids, 0.45% saline at 75 mL/h IV Dilaudid PCA, no changes today Toradol 15 mg IV every 6 hours for total of 5 days Initiate oxycodone 10 mg every 4 hours for severe breakthrough pain Monitor  vital signs very closely, reevaluate pain scale regularly, and supplemental oxygen as needed  Chronic pain syndrome: Continue home medications  Community-acquired pneumonia: Initiate IV antibiotics.  Incentive spirometer.  Continue supplemental oxygen, titrate as tolerated. Tylenol 650 mg every 4 hours as needed for fever  Anemia of chronic disease: Hemoglobin is stable and consistent with patient's baseline.  No clinical indication for blood transfusion at this time.  Leukocytosis: Mild leukocytosis.  More than likely secondary to CAP and sickle cell disease.  Continue to follow closely.  Labs in AM.  History of hypertension: Stable.  Continue home medications.  Mild intermittent asthma: Stable.  Patient states it has been many years since exacerbation.  Home medications as needed   Code Status: Full Code Family Communication: N/A Disposition Plan: Not yet ready for discharge   [place], MSN, FNP-C [facility] [place], [place] [address] [phone]  If 7PM-7AM, please contact night-coverage.  01/07/2022, 1:33 PM  LOS: 1 day

## 2022-01-08 ENCOUNTER — Encounter (HOSPITAL_COMMUNITY): Payer: Self-pay | Admitting: Family Medicine

## 2022-01-08 ENCOUNTER — Other Ambulatory Visit: Payer: Self-pay

## 2022-01-08 DIAGNOSIS — D5700 Hb-SS disease with crisis, unspecified: Secondary | ICD-10-CM | POA: Diagnosis not present

## 2022-01-08 LAB — CBC
HCT: 22.7 % — ABNORMAL LOW (ref 39.0–52.0)
Hemoglobin: 8.4 g/dL — ABNORMAL LOW (ref 13.0–17.0)
MCH: 30 pg (ref 26.0–34.0)
MCHC: 37 g/dL — ABNORMAL HIGH (ref 30.0–36.0)
MCV: 81.1 fL (ref 80.0–100.0)
Platelets: 311 10*3/uL (ref 150–400)
RBC: 2.8 MIL/uL — ABNORMAL LOW (ref 4.22–5.81)
RDW: 20.3 % — ABNORMAL HIGH (ref 11.5–15.5)
WBC: 13.8 10*3/uL — ABNORMAL HIGH (ref 4.0–10.5)
nRBC: 3.1 % — ABNORMAL HIGH (ref 0.0–0.2)

## 2022-01-08 LAB — BASIC METABOLIC PANEL
Anion gap: 7 (ref 5–15)
BUN: 10 mg/dL (ref 6–20)
CO2: 27 mmol/L (ref 22–32)
Calcium: 8.7 mg/dL — ABNORMAL LOW (ref 8.9–10.3)
Chloride: 99 mmol/L (ref 98–111)
Creatinine, Ser: 0.58 mg/dL — ABNORMAL LOW (ref 0.61–1.24)
GFR, Estimated: >60 mL/min (ref >60)
Glucose, Bld: 112 mg/dL — ABNORMAL HIGH (ref 70–99)
Potassium: 3.7 mmol/L (ref 3.5–5.1)
Sodium: 133 mmol/L — ABNORMAL LOW (ref 135–145)

## 2022-01-08 MED ORDER — ALBUTEROL SULFATE (2.5 MG/3ML) 0.083% IN NEBU: 3.0000 mL | INHALATION_SOLUTION | RESPIRATORY_TRACT | Status: DC | PRN

## 2022-01-08 MED ORDER — LORATADINE 10 MG PO TABS
10.0000 mg | ORAL_TABLET | Freq: Every day | ORAL | Status: DC
  Administered 2022-01-08 – 2022-01-11 (×4): 10 mg via ORAL
  Filled 2022-01-08 (×4): qty 1

## 2022-01-08 MED ORDER — FLUTICASONE PROPIONATE 50 MCG/ACT NA SUSP
2.0000 | Freq: Every day | NASAL | Status: DC
  Administered 2022-01-08 – 2022-01-11 (×3): 2 via NASAL
  Filled 2022-01-08: qty 16

## 2022-01-08 NOTE — Progress Notes (Signed)
Subjective: [name] Hill is a 21 year old male with a medical history significant for sickle cell disease, chronic pain syndrome, opiate dependence and tolerance, and history of anemia of chronic disease that was admitted for sickle cell pain crisis.  Patient continues to complain of pain primarily to upper and lower extremities.  Pain intensity is 6/10.  Patient denies any chest pain, shortness of breath, urinary symptoms, nausea, vomiting, or diarrhea.  Objective:  Vital signs in last 24 hours:  Vitals:   01/08/22 0507 01/08/22 0731 01/08/22 0945 01/08/22 1221  BP: (!) 144/80     Pulse: 78     Resp: 16 16 18 20   Temp: 98.7 F (37.1 C)     TempSrc: Oral     SpO2: 91% 93% 93% 96%  Weight:      Height:        Intake/Output from previous day:   Intake/Output Summary (Last 24 hours) at 01/08/2022 1436 Last data filed at 01/07/2022 1500 Gross per 24 hour  Intake 630.91 ml  Output --  Net 630.91 ml    Physical Exam: General: Alert, awake, oriented x3, in no acute distress.  HEENT: [place]/AT PEERL, EOMI Neck: Trachea midline,  no masses, no thyromegal,y no JVD, no carotid bruit OROPHARYNX:  Moist, No exudate/ erythema/lesions.  Heart: Regular rate and rhythm, without murmurs, rubs, gallops, PMI non-displaced, no heaves or thrills on palpation.  Lungs: Clear to auscultation, no wheezing or rhonchi noted. No increased vocal fremitus resonant to percussion  Abdomen: Soft, nontender, nondistended, positive bowel sounds, no masses no hepatosplenomegaly noted..  Neuro: No focal neurological deficits noted cranial nerves II through XII grossly intact. DTRs 2+ bilaterally upper and lower extremities. Strength 5 out of 5 in bilateral upper and lower extremities. Musculoskeletal: No warm swelling or erythema around joints, no spinal tenderness noted. Psychiatric: Patient alert and oriented x3, good insight and cognition, good recent to remote recall. Lymph node survey: No cervical axillary or  inguinal lymphadenopathy noted.  Lab Results:  Basic Metabolic Panel:    Component Value Date/Time   NA 133 (L) 01/08/2022 0616   NA 142 06/26/2018 1416   K 3.7 01/08/2022 0616   CL 99 01/08/2022 0616   CO2 27 01/08/2022 0616   BUN 10 01/08/2022 0616   BUN 7 06/26/2018 1416   CREATININE 0.58 (L) 01/08/2022 0616   GLUCOSE 112 (H) 01/08/2022 0616   CALCIUM 8.7 (L) 01/08/2022 0616   CBC:    Component Value Date/Time   WBC 13.8 (H) 01/08/2022 0616   HGB 8.4 (L) 01/08/2022 0616   HGB 9.6 (L) 06/26/2018 1416   HCT 22.7 (L) 01/08/2022 0616   HCT 28.5 (L) 06/26/2018 1416   PLT 311 01/08/2022 0616   PLT 405 06/26/2018 1416   MCV 81.1 01/08/2022 0616   MCV 92 06/26/2018 1416   NEUTROABS 6.0 01/06/2022 0935   NEUTROABS 3.7 06/26/2018 1416   LYMPHSABS 5.7 (H) 01/06/2022 0935   LYMPHSABS 3.3 (H) 06/26/2018 1416   MONOABS 1.3 (H) 01/06/2022 0935   EOSABS 0.2 01/06/2022 0935   EOSABS 0.2 06/26/2018 1416   BASOSABS 0.2 (H) 01/06/2022 0935   BASOSABS 0.1 06/26/2018 1416    Recent Results (from the past 240 hour(s))  Resp Panel by RT-PCR (Flu A&B, Covid) Nasopharyngeal Swab     Status: None   Collection Time: 01/06/22  9:38 AM   Specimen: Nasopharyngeal Swab; Nasopharyngeal(NP) swabs in vial transport medium  Result Value Ref Range Status   SARS Coronavirus 2 by RT PCR  NEGATIVE NEGATIVE Final    Comment: (NOTE) SARS-CoV-2 target nucleic acids are NOT DETECTED.  The SARS-CoV-2 RNA is generally detectable in upper respiratory specimens during the acute phase of infection. The lowest concentration of SARS-CoV-2 viral copies this assay can detect is 138 copies/mL. A negative result does not preclude SARS-Cov-2 infection and should not be used as the sole basis for treatment or other patient management decisions. A negative result may occur with  improper specimen collection/handling, submission of specimen other than nasopharyngeal swab, presence of viral mutation(s) within  the areas targeted by this assay, and inadequate number of viral copies(<138 copies/mL). A negative result must be combined with clinical observations, patient history, and epidemiological information. The expected result is Negative.  Fact Sheet for Patients:  BloggerCourse.com  Fact Sheet for Healthcare Providers:  SeriousBroker.it  This test is no t yet approved or cleared by the [place] FDA and  has been authorized for detection and/or diagnosis of SARS-CoV-2 by FDA under an Emergency Use Authorization (EUA). This EUA will remain  in effect (meaning this test can be used) for the duration of the COVID-19 declaration under Section 564(b)(1) of the Act, 21 U.S.C.section 360bbb-3(b)(1), unless the authorization is terminated  or revoked sooner.       Influenza A by PCR NEGATIVE NEGATIVE Final   Influenza B by PCR NEGATIVE NEGATIVE Final    Comment: (NOTE) The Xpert Xpress SARS-CoV-2/FLU/RSV plus assay is intended as an aid in the diagnosis of influenza from Nasopharyngeal swab specimens and should not be used as a sole basis for treatment. Nasal washings and aspirates are unacceptable for Xpert Xpress SARS-CoV-2/FLU/RSV testing.  Fact Sheet for Patients: BloggerCourse.com  Fact Sheet for Healthcare Providers: SeriousBroker.it  This test is not yet approved or cleared by the [place] FDA and has been authorized for detection and/or diagnosis of SARS-CoV-2 by FDA under an Emergency Use Authorization (EUA). This EUA will remain in effect (meaning this test can be used) for the duration of the COVID-19 declaration under Section 564(b)(1) of the Act, 21 U.S.C. section 360bbb-3(b)(1), unless the authorization is terminated or revoked.  Performed at [facility], [address], [place], [place] [address]     Studies/Results: No results  found.  Medications: Scheduled Meds:  enoxaparin (LOVENOX) injection  40 mg Subcutaneous Q24H   fluticasone  2 spray Each Nare Daily   folic acid  1 mg Oral Daily   HYDROmorphone   Intravenous Q4H   ketorolac  15 mg Intravenous Q6H   loratadine  10 mg Oral Daily   losartan  50 mg Oral Daily   senna-docusate  1 tablet Oral BID   Continuous Infusions:  sodium chloride 75 mL/hr at 01/08/22 0210   cefTRIAXone (ROCEPHIN)  IV 1 g (01/08/22 0949)   PRN Meds:.acetaminophen, albuterol, diphenhydrAMINE, naloxone **AND** sodium chloride flush, oxyCODONE, polyethylene glycol  Consultants: None  Procedures: None  Antibiotics: Ceftriaxone  Assessment/Plan: Principal Problem:   Sickle cell pain crisis (HCC) Active Problems:   Leukocytosis   Hypertension   Chronic pain syndrome  Sickle cell disease with pain crisis: Decrease IV fluids to KVO IV Dilaudid PCA, no changes today Toradol 15 mg IV every 6 hours for total of 5 days Continue oxycodone 10 mg every 4 hours for severe breakthrough pain Monitor vital signs very closely, reevaluate pain scale regularly, and supplemental oxygen as needed  Chronic pain syndrome: Continue home medications  Community-acquired pneumonia: Initiate IV antibiotics.  Incentive spirometer.  Continue supplemental oxygen, titrate  as tolerated. Tylenol 650 mg every 4 hours as needed for fever  Anemia of chronic disease: Hemoglobin is stable and consistent with patient's baseline.  No clinical indication for blood transfusion at this time.  Leukocytosis: Mild leukocytosis.  More than likely secondary to CAP and sickle cell disease.  Continue to follow closely.  Labs in AM.  History of hypertension: Stable.  Continue home medications.  Mild intermittent asthma: Stable.  Patient states it has been many years since exacerbation.  Home medications as needed   Code Status: Full Code Family Communication: N/A Disposition Plan: Not yet ready for  discharge   Tannah Dreyfuss Rennis Petty  APRN, MSN, FNP-C Patient Care Center [facility] [address] [place], [place] [address] [phone]  If 5PM-7AM, please contact night-coverage.  01/08/2022, 2:36 PM  LOS: 2 days

## 2022-01-09 DIAGNOSIS — D5700 Hb-SS disease with crisis, unspecified: Secondary | ICD-10-CM | POA: Diagnosis not present

## 2022-01-09 MED ORDER — HYDROMORPHONE 1 MG/ML IV SOLN
INTRAVENOUS | Status: DC
  Administered 2022-01-09: 2 mg via INTRAVENOUS
  Administered 2022-01-09: 0.5 mg via INTRAVENOUS
  Administered 2022-01-09: 2 mg via INTRAVENOUS
  Administered 2022-01-10: 0.5 mg via INTRAVENOUS
  Administered 2022-01-10: 3.5 mg via INTRAVENOUS
  Administered 2022-01-10: 1 mg via INTRAVENOUS
  Administered 2022-01-10: 0.5 mg via INTRAVENOUS
  Administered 2022-01-10: 1 mg via INTRAVENOUS
  Administered 2022-01-10: 2 mg via INTRAVENOUS
  Administered 2022-01-11: 3 mg via INTRAVENOUS
  Administered 2022-01-11: 1.5 mg via INTRAVENOUS

## 2022-01-09 NOTE — Progress Notes (Signed)
°  Transition of Care [facility]) Screening Note   Patient Details  Name: KAREM TOMASO Date of Birth: 2001-05-07   Transition of Care [facility]) CM/SW Contact:    Kerrianne Jeng, Meriam Sprague, RN Phone Number: 01/09/2022, 9:56 AM    Transition of Care Department [facility]) has reviewed patient and no TOC needs have been identified at this time. We will continue to monitor patient advancement through interdisciplinary progression rounds. If new patient transition needs arise, please place a TOC consult.

## 2022-01-09 NOTE — Progress Notes (Signed)
Subjective: [name] Hill is a 21 year old male with a medical history significant for sickle cell disease, chronic pain syndrome, opiate dependence and tolerance, and history of anemia of chronic disease that was admitted for sickle cell pain crisis.  Patient continues to complain of pain primarily to upper and lower extremities.  Pain intensity is 6/10.  Patient denies any chest pain, shortness of breath, urinary symptoms, nausea, vomiting, or diarrhea.  Objective:  Vital signs in last 24 hours:  Vitals:   01/09/22 0422 01/09/22 0439 01/09/22 0730 01/09/22 0815  BP:  (!) 111/54  131/68  Pulse:  79  78  Resp: 18 18 18 18   Temp:  99.5 F (37.5 C)  99.2 F (37.3 C)  TempSrc:  Oral  Oral  SpO2: 98% 93% 96% 98%  Weight:      Height:        Intake/Output from previous day:  No intake or output data in the 24 hours ending 01/09/22 1204   Physical Exam: General: Alert, awake, oriented x3, in no acute distress.  HEENT: [place]/AT PEERL, EOMI Neck: Trachea midline,  no masses, no thyromegal,y no JVD, no carotid bruit OROPHARYNX:  Moist, No exudate/ erythema/lesions.  Heart: Regular rate and rhythm, without murmurs, rubs, gallops, PMI non-displaced, no heaves or thrills on palpation.  Lungs: Clear to auscultation, no wheezing or rhonchi noted. No increased vocal fremitus resonant to percussion  Abdomen: Soft, nontender, nondistended, positive bowel sounds, no masses no hepatosplenomegaly noted..  Neuro: No focal neurological deficits noted cranial nerves II through XII grossly intact. DTRs 2+ bilaterally upper and lower extremities. Strength 5 out of 5 in bilateral upper and lower extremities. Musculoskeletal: No warm swelling or erythema around joints, no spinal tenderness noted. Psychiatric: Patient alert and oriented x3, good insight and cognition, good recent to remote recall. Lymph node survey: No cervical axillary or inguinal lymphadenopathy noted.  Lab Results:  Basic Metabolic  Panel:    Component Value Date/Time   NA 133 (L) 01/08/2022 0616   NA 142 06/26/2018 1416   K 3.7 01/08/2022 0616   CL 99 01/08/2022 0616   CO2 27 01/08/2022 0616   BUN 10 01/08/2022 0616   BUN 7 06/26/2018 1416   CREATININE 0.58 (L) 01/08/2022 0616   GLUCOSE 112 (H) 01/08/2022 0616   CALCIUM 8.7 (L) 01/08/2022 0616   CBC:    Component Value Date/Time   WBC 13.8 (H) 01/08/2022 0616   HGB 8.4 (L) 01/08/2022 0616   HGB 9.6 (L) 06/26/2018 1416   HCT 22.7 (L) 01/08/2022 0616   HCT 28.5 (L) 06/26/2018 1416   PLT 311 01/08/2022 0616   PLT 405 06/26/2018 1416   MCV 81.1 01/08/2022 0616   MCV 92 06/26/2018 1416   NEUTROABS 6.0 01/06/2022 0935   NEUTROABS 3.7 06/26/2018 1416   LYMPHSABS 5.7 (H) 01/06/2022 0935   LYMPHSABS 3.3 (H) 06/26/2018 1416   MONOABS 1.3 (H) 01/06/2022 0935   EOSABS 0.2 01/06/2022 0935   EOSABS 0.2 06/26/2018 1416   BASOSABS 0.2 (H) 01/06/2022 0935   BASOSABS 0.1 06/26/2018 1416    Recent Results (from the past 240 hour(s))  Resp Panel by RT-PCR (Flu A&B, Covid) Nasopharyngeal Swab     Status: None   Collection Time: 01/06/22  9:38 AM   Specimen: Nasopharyngeal Swab; Nasopharyngeal(NP) swabs in vial transport medium  Result Value Ref Range Status   SARS Coronavirus 2 by RT PCR NEGATIVE NEGATIVE Final    Comment: (NOTE) SARS-CoV-2 target nucleic acids are NOT DETECTED.  The SARS-CoV-2 RNA is generally detectable in upper respiratory specimens during the acute phase of infection. The lowest concentration of SARS-CoV-2 viral copies this assay can detect is 138 copies/mL. A negative result does not preclude SARS-Cov-2 infection and should not be used as the sole basis for treatment or other patient management decisions. A negative result may occur with  improper specimen collection/handling, submission of specimen other than nasopharyngeal swab, presence of viral mutation(s) within the areas targeted by this assay, and inadequate number of  viral copies(<138 copies/mL). A negative result must be combined with clinical observations, patient history, and epidemiological information. The expected result is Negative.  Fact Sheet for Patients:  BloggerCourse.com  Fact Sheet for Healthcare Providers:  SeriousBroker.it  This test is no t yet approved or cleared by the [place] FDA and  has been authorized for detection and/or diagnosis of SARS-CoV-2 by FDA under an Emergency Use Authorization (EUA). This EUA will remain  in effect (meaning this test can be used) for the duration of the COVID-19 declaration under Section 564(b)(1) of the Act, 21 U.S.C.section 360bbb-3(b)(1), unless the authorization is terminated  or revoked sooner.       Influenza A by PCR NEGATIVE NEGATIVE Final   Influenza B by PCR NEGATIVE NEGATIVE Final    Comment: (NOTE) The Xpert Xpress SARS-CoV-2/FLU/RSV plus assay is intended as an aid in the diagnosis of influenza from Nasopharyngeal swab specimens and should not be used as a sole basis for treatment. Nasal washings and aspirates are unacceptable for Xpert Xpress SARS-CoV-2/FLU/RSV testing.  Fact Sheet for Patients: BloggerCourse.com  Fact Sheet for Healthcare Providers: SeriousBroker.it  This test is not yet approved or cleared by the [place] FDA and has been authorized for detection and/or diagnosis of SARS-CoV-2 by FDA under an Emergency Use Authorization (EUA). This EUA will remain in effect (meaning this test can be used) for the duration of the COVID-19 declaration under Section 564(b)(1) of the Act, 21 U.S.C. section 360bbb-3(b)(1), unless the authorization is terminated or revoked.  Performed at [facility], [address] [place], [place] [address]     Studies/Results: No results found.  Medications: Scheduled Meds:  enoxaparin (LOVENOX)  injection  40 mg Subcutaneous Q24H   fluticasone  2 spray Each Nare Daily   folic acid  1 mg Oral Daily   HYDROmorphone   Intravenous Q4H   ketorolac  15 mg Intravenous Q6H   loratadine  10 mg Oral Daily   losartan  50 mg Oral Daily   senna-docusate  1 tablet Oral BID   Continuous Infusions:  sodium chloride 10 mL/hr at 01/08/22 1457   cefTRIAXone (ROCEPHIN)  IV 1 g (01/09/22 0946)   PRN Meds:.acetaminophen, albuterol, diphenhydrAMINE, naloxone **AND** sodium chloride flush, oxyCODONE, polyethylene glycol  Consultants: None  Procedures: None  Antibiotics: Ceftriaxone  Assessment/Plan: Principal Problem:   Sickle cell pain crisis (HCC) Active Problems:   Leukocytosis   Hypertension   Chronic pain syndrome  Sickle cell disease with pain crisis: Decrease IV fluids to KVO IV Dilaudid PCA, no changes today Toradol 15 mg IV every 6 hours for total of 5 days Continue oxycodone 10 mg every 4 hours for severe breakthrough pain Monitor vital signs very closely, reevaluate pain scale regularly, and supplemental oxygen as needed  Chronic pain syndrome: Continue home medications  Community-acquired pneumonia: Continue IV antibiotics.  Incentive spirometer.  Continue supplemental oxygen, titrate as tolerated. Tylenol 650 mg every 4 hours as needed for fever  Anemia of chronic  disease: Hemoglobin is stable and consistent with patient's baseline.  No clinical indication for blood transfusion at this time.  Leukocytosis: Mild leukocytosis.  More than likely secondary to CAP and sickle cell disease.  Continue to follow closely.  Labs in AM.  History of hypertension: Stable.  Continue home medications.  Mild intermittent asthma: Stable.  Patient states it has been many years since exacerbation.  Home medications as needed   Code Status: Full Code Family Communication: N/A Disposition Plan: Not yet ready for discharge   Brandye Inthavong Rennis Petty  APRN, MSN, FNP-C Patient Care  Center [facility] [address] [place], [place] [address] [phone]  If 5PM-7AM, please contact night-coverage.  01/09/2022, 12:04 PM  LOS: 3 days

## 2022-01-10 DIAGNOSIS — D5700 Hb-SS disease with crisis, unspecified: Secondary | ICD-10-CM | POA: Diagnosis not present

## 2022-01-10 LAB — CBC WITH DIFFERENTIAL/PLATELET
Abs Immature Granulocytes: 0.06 10*3/uL (ref 0.00–0.07)
Basophils Absolute: 0.1 10*3/uL (ref 0.0–0.1)
Basophils Relative: 0 %
Eosinophils Absolute: 0.1 10*3/uL (ref 0.0–0.5)
Eosinophils Relative: 1 %
HCT: 21.8 % — ABNORMAL LOW (ref 39.0–52.0)
Hemoglobin: 7.9 g/dL — ABNORMAL LOW (ref 13.0–17.0)
Immature Granulocytes: 1 %
Lymphocytes Relative: 29 %
Lymphs Abs: 3.7 10*3/uL (ref 0.7–4.0)
MCH: 30 pg (ref 26.0–34.0)
MCHC: 36.2 g/dL — ABNORMAL HIGH (ref 30.0–36.0)
MCV: 82.9 fL (ref 80.0–100.0)
Monocytes Absolute: 1.6 10*3/uL — ABNORMAL HIGH (ref 0.1–1.0)
Monocytes Relative: 13 %
Neutro Abs: 7.1 10*3/uL (ref 1.7–7.7)
Neutrophils Relative %: 56 %
Platelets: 313 10*3/uL (ref 150–400)
RBC: 2.63 MIL/uL — ABNORMAL LOW (ref 4.22–5.81)
RDW: 19.7 % — ABNORMAL HIGH (ref 11.5–15.5)
WBC: 12.6 10*3/uL — ABNORMAL HIGH (ref 4.0–10.5)
nRBC: 0.6 % — ABNORMAL HIGH (ref 0.0–0.2)

## 2022-01-10 LAB — COMPREHENSIVE METABOLIC PANEL
ALT: 22 U/L (ref 0–44)
AST: 33 U/L (ref 15–41)
Albumin: 3.5 g/dL (ref 3.5–5.0)
Alkaline Phosphatase: 74 U/L (ref 38–126)
Anion gap: 8 (ref 5–15)
BUN: 12 mg/dL (ref 6–20)
CO2: 27 mmol/L (ref 22–32)
Calcium: 8.5 mg/dL — ABNORMAL LOW (ref 8.9–10.3)
Chloride: 104 mmol/L (ref 98–111)
Creatinine, Ser: 0.53 mg/dL — ABNORMAL LOW (ref 0.61–1.24)
GFR, Estimated: >60 mL/min (ref >60)
Glucose, Bld: 103 mg/dL — ABNORMAL HIGH (ref 70–99)
Potassium: 4 mmol/L (ref 3.5–5.1)
Sodium: 139 mmol/L (ref 135–145)
Total Bilirubin: 3.5 mg/dL — ABNORMAL HIGH (ref 0.3–1.2)
Total Protein: 6.9 g/dL (ref 6.5–8.1)

## 2022-01-10 MED ORDER — IBUPROFEN 800 MG PO TABS: 800.0000 mg | ORAL_TABLET | Freq: Three times a day (TID) | ORAL | 0 refills | Status: DC | PRN

## 2022-01-10 MED ORDER — OXYCODONE HCL 10 MG PO TABS: 10.0000 mg | ORAL_TABLET | ORAL | 0 refills | Status: DC | PRN

## 2022-01-10 NOTE — Progress Notes (Signed)
Patient ambulated in hall with out oxygen. Oxygen saturation while ambulating 84 to 90%, continually dropped below 90% while ambulating. Will continue to monitor.

## 2022-01-10 NOTE — Progress Notes (Signed)
Pt removed O2 while sleeping, O2 sat dropped to 75% on room air. Had pt replace O2 and increased to 4L to maintain sats 90-92%. Mick Sell RN

## 2022-01-10 NOTE — Progress Notes (Signed)
Subjective: [name] Hill is a 21 year old male with a medical history significant for sickle cell disease, chronic pain syndrome, opiate dependence and tolerance, and history of anemia of chronic disease that was admitted for sickle cell pain crisis.  Patient continues to complain of pain primarily to upper and lower extremities.  Pain intensity is 6/10.  Patient denies any chest pain, shortness of breath, urinary symptoms, nausea, vomiting, or diarrhea.  Patient has an oxygen requirement of 3 L/min to maintain oxygen saturation above 90%.  Objective:  Vital signs in last 24 hours:  Vitals:   01/10/22 1003 01/10/22 1157 01/10/22 1513 01/10/22 1514  BP: 121/64  (!) 121/54   Pulse: 91  83   Resp: 20 20 (!) 21 (!) 21  Temp: 99.6 F (37.6 C)  99.6 F (37.6 C)   TempSrc: Oral  Oral   SpO2: 95% 95% 91% 91%  Weight:      Height:        Intake/Output from previous day:  No intake or output data in the 24 hours ending 01/10/22 1620   Physical Exam: General: Alert, awake, oriented x3, in no acute distress.  HEENT: [place]/AT PEERL, EOMI Neck: Trachea midline,  no masses, no thyromegal,y no JVD, no carotid bruit OROPHARYNX:  Moist, No exudate/ erythema/lesions.  Heart: Regular rate and rhythm, without murmurs, rubs, gallops, PMI non-displaced, no heaves or thrills on palpation.  Lungs: Clear to auscultation, no wheezing or rhonchi noted. No increased vocal fremitus resonant to percussion  Abdomen: Soft, nontender, nondistended, positive bowel sounds, no masses no hepatosplenomegaly noted..  Neuro: No focal neurological deficits noted cranial nerves II through XII grossly intact. DTRs 2+ bilaterally upper and lower extremities. Strength 5 out of 5 in bilateral upper and lower extremities. Musculoskeletal: No warm swelling or erythema around joints, no spinal tenderness noted. Psychiatric: Patient alert and oriented x3, good insight and cognition, good recent to remote recall. Lymph node survey:  No cervical axillary or inguinal lymphadenopathy noted.  Lab Results:  Basic Metabolic Panel:    Component Value Date/Time   NA 139 01/10/2022 0844   NA 142 06/26/2018 1416   K 4.0 01/10/2022 0844   CL 104 01/10/2022 0844   CO2 27 01/10/2022 0844   BUN 12 01/10/2022 0844   BUN 7 06/26/2018 1416   CREATININE 0.53 (L) 01/10/2022 0844   GLUCOSE 103 (H) 01/10/2022 0844   CALCIUM 8.5 (L) 01/10/2022 0844   CBC:    Component Value Date/Time   WBC 12.6 (H) 01/10/2022 0844   HGB 7.9 (L) 01/10/2022 0844   HGB 9.6 (L) 06/26/2018 1416   HCT 21.8 (L) 01/10/2022 0844   HCT 28.5 (L) 06/26/2018 1416   PLT 313 01/10/2022 0844   PLT 405 06/26/2018 1416   MCV 82.9 01/10/2022 0844   MCV 92 06/26/2018 1416   NEUTROABS 7.1 01/10/2022 0844   NEUTROABS 3.7 06/26/2018 1416   LYMPHSABS 3.7 01/10/2022 0844   LYMPHSABS 3.3 (H) 06/26/2018 1416   MONOABS 1.6 (H) 01/10/2022 0844   EOSABS 0.1 01/10/2022 0844   EOSABS 0.2 06/26/2018 1416   BASOSABS 0.1 01/10/2022 0844   BASOSABS 0.1 06/26/2018 1416    Recent Results (from the past 240 hour(s))  Resp Panel by RT-PCR (Flu A&B, Covid) Nasopharyngeal Swab     Status: None   Collection Time: 01/06/22  9:38 AM   Specimen: Nasopharyngeal Swab; Nasopharyngeal(NP) swabs in vial transport medium  Result Value Ref Range Status   SARS Coronavirus 2 by RT PCR NEGATIVE NEGATIVE  Final    Comment: (NOTE) SARS-CoV-2 target nucleic acids are NOT DETECTED.  The SARS-CoV-2 RNA is generally detectable in upper respiratory specimens during the acute phase of infection. The lowest concentration of SARS-CoV-2 viral copies this assay can detect is 138 copies/mL. A negative result does not preclude SARS-Cov-2 infection and should not be used as the sole basis for treatment or other patient management decisions. A negative result may occur with  improper specimen collection/handling, submission of specimen other than nasopharyngeal swab, presence of viral mutation(s)  within the areas targeted by this assay, and inadequate number of viral copies(<138 copies/mL). A negative result must be combined with clinical observations, patient history, and epidemiological information. The expected result is Negative.  Fact Sheet for Patients:  BloggerCourse.com  Fact Sheet for Healthcare Providers:  SeriousBroker.it  This test is no t yet approved or cleared by the [place] FDA and  has been authorized for detection and/or diagnosis of SARS-CoV-2 by FDA under an Emergency Use Authorization (EUA). This EUA will remain  in effect (meaning this test can be used) for the duration of the COVID-19 declaration under Section 564(b)(1) of the Act, 21 U.S.C.section 360bbb-3(b)(1), unless the authorization is terminated  or revoked sooner.       Influenza A by PCR NEGATIVE NEGATIVE Final   Influenza B by PCR NEGATIVE NEGATIVE Final    Comment: (NOTE) The Xpert Xpress SARS-CoV-2/FLU/RSV plus assay is intended as an aid in the diagnosis of influenza from Nasopharyngeal swab specimens and should not be used as a sole basis for treatment. Nasal washings and aspirates are unacceptable for Xpert Xpress SARS-CoV-2/FLU/RSV testing.  Fact Sheet for Patients: BloggerCourse.com  Fact Sheet for Healthcare Providers: SeriousBroker.it  This test is not yet approved or cleared by the [place] FDA and has been authorized for detection and/or diagnosis of SARS-CoV-2 by FDA under an Emergency Use Authorization (EUA). This EUA will remain in effect (meaning this test can be used) for the duration of the COVID-19 declaration under Section 564(b)(1) of the Act, 21 U.S.C. section 360bbb-3(b)(1), unless the authorization is terminated or revoked.  Performed at [facility], [address] [place], [place] [address]     Studies/Results: No results  found.  Medications: Scheduled Meds:  enoxaparin (LOVENOX) injection  40 mg Subcutaneous Q24H   fluticasone  2 spray Each Nare Daily   folic acid  1 mg Oral Daily   HYDROmorphone   Intravenous Q4H   ketorolac  15 mg Intravenous Q6H   loratadine  10 mg Oral Daily   losartan  50 mg Oral Daily   senna-docusate  1 tablet Oral BID   Continuous Infusions:  sodium chloride 10 mL/hr at 01/08/22 1457   cefTRIAXone (ROCEPHIN)  IV 1 g (01/10/22 0953)   PRN Meds:.acetaminophen, albuterol, diphenhydrAMINE, naloxone **AND** sodium chloride flush, oxyCODONE, polyethylene glycol  Consultants: None  Procedures: None  Antibiotics: Ceftriaxone  Assessment/Plan: Principal Problem:   Sickle cell pain crisis (HCC) Active Problems:   Leukocytosis   Hypertension   Chronic pain syndrome  Sickle cell disease with pain crisis: Decrease IV fluids to KVO IV Dilaudid PCA, no changes today Toradol 15 mg IV every 6 hours for total of 5 days Continue oxycodone 10 mg every 4 hours for severe breakthrough pain Monitor vital signs very closely, reevaluate pain scale regularly, and supplemental oxygen as needed  Chronic pain syndrome: Continue home medications  Community-acquired pneumonia: Continue IV antibiotics.  Hypoxia continues, patient requires 3 L to maintain oxygen  saturation above 90%.  Also, on ambulating pulse ox, oxygen saturation maintained in the 80s.  Incentive spirometer.  Continue supplemental oxygen, titrate as tolerated. Tylenol 650 mg every 4 hours as needed for fever  Anemia of chronic disease: Hemoglobin is stable and consistent with patient's baseline.  No clinical indication for blood transfusion at this time.  Leukocytosis: Mild leukocytosis.  More than likely secondary to CAP and sickle cell disease.  Continue to follow closely.  Labs in AM.  History of hypertension: Stable.  Continue home medications.  Mild intermittent asthma: Stable.  Patient states it has been  many years since exacerbation.  Home medications as needed   Code Status: Full Code Family Communication: N/A Disposition Plan: Not yet ready for discharge   Happy [place] Rennis Petty  APRN, MSN, FNP-C Patient Care Center [facility] [address] [place], [place] [address] [phone]  If 5PM-7AM, please contact night-coverage.  01/10/2022, 4:20 PM  LOS: 4 days

## 2022-01-11 LAB — CBC
HCT: 19.4 % — ABNORMAL LOW (ref 39.0–52.0)
Hemoglobin: 7.1 g/dL — ABNORMAL LOW (ref 13.0–17.0)
MCH: 29.7 pg (ref 26.0–34.0)
MCHC: 36.6 g/dL — ABNORMAL HIGH (ref 30.0–36.0)
MCV: 81.2 fL (ref 80.0–100.0)
Platelets: 343 10*3/uL (ref 150–400)
RBC: 2.39 MIL/uL — ABNORMAL LOW (ref 4.22–5.81)
RDW: 19.1 % — ABNORMAL HIGH (ref 11.5–15.5)
WBC: 12.7 10*3/uL — ABNORMAL HIGH (ref 4.0–10.5)
nRBC: 0.2 % (ref 0.0–0.2)

## 2022-01-11 LAB — TYPE AND SCREEN
ABO/RH(D): O POS
Antibody Screen: NEGATIVE

## 2022-01-11 NOTE — Discharge Instructions (Signed)
Your oxygen saturation decreases with ambulation.  You will go home with supplemental oxygen as discussed earlier.  Also, we have scheduled you for a follow-up at the sickle cell clinic on Thursday, February 23 at 8:40 AM. Your home pain medications have been sent to your pharmacy.  Please notify my office if there are any further questions or concerns.  Nolon Nations  APRN, MSN, FNP-C Patient Care [facility] [address] [place], [place] [address] [phone]

## 2022-01-11 NOTE — Progress Notes (Signed)
SATURATION QUALIFICATIONS: (This note is used to comply with regulatory documentation for home oxygen)  Patient Saturations on Room Air at Rest = 90%  Patient Saturations on Room Air while Ambulating = 79%  Patient Saturations on 3 Liters of oxygen while Ambulating = 94%  Please briefly explain why patient needs home oxygen: patient continually drops below 80% on room air while ambulating.  Farrel Gobble, RN , BSN

## 2022-01-11 NOTE — TOC Transition Note (Signed)
Transition of Care [facility]) - CM/SW Discharge Note   Patient Details  Name: [name] Hill MRN: [id number] Date of Birth: 2001-08-09  Transition of Care [facility]) CM/SW Contact:  Bartholome Bill, RN Phone Number: 01/11/2022, 1:33 PM   Clinical Narrative:    Home 02 order received. Desat screen done by nursing. [employer] liaison contacted for 02 delivery to pt room.

## 2022-01-11 NOTE — Plan of Care (Signed)
Patient ambulated in hall without oxygen. Oxygen saturation while ambulating 88% to 79% Will continue to monitor.  Farrel Gobble, RN, BSN

## 2022-01-11 NOTE — Discharge Summary (Signed)
Physician Discharge Summary  Christen BameKaileb M Akey ZOX:[id number] DOB: 06-02-2001 DOA: 01/06/2022  PCP: Massie MaroonHollis, Davina Howlett M, FNP  Admit date: 01/06/2022  Discharge date: 01/11/2022  Discharge Diagnoses:  Principal Problem:   Sickle cell pain crisis (HCC) Active Problems:   Leukocytosis   Hypertension   Chronic pain syndrome   Discharge Condition: Stable  Disposition:   Follow-up Information     Massie MaroonHollis, Damir Leung M, FNP Follow up.   Specialty: Family Medicine Contact information: [address] [place] [address] [phone]                Pt is discharged home in good condition and is to follow up with Massie MaroonHollis, Bradden Tadros M, FNP this week to have labs evaluated. Christen BameKaileb M Profitt is instructed to increase activity slowly and balance with rest for the next few days, and use prescribed medication to complete treatment of pain  Diet: Regular Wt Readings from Last 3 Encounters:  01/06/22 90.7 kg  03/06/21 88 kg  02/21/21 90.7 kg    History of present illness:  [name] Hill  is a 21 y.o. male with a medical history significant for sickle cell disease, chronic pain syndrome, history of mild intermittent asthma, history of anemia of chronic disease, and hypertension presents to the ER with complaints of pain to chest, back, upper and lower extremities.  Patient says that pain is consistent with his previous sickle cell crisis.  He says that he awakened this a.m. with sharp pain primarily to his chest.  He took oxycodone at that time without any relief.  He attributes this pain crisis to changes in weather.  He states that he has not taken folic acid or hydroxyurea over the past several months.  Currently his pain intensity is 9/10 characterized as constant and throbbing.  While in the emergency department, oxygen saturation consistently below 90%, return to baseline on 2 L supplemental oxygen.  Patient does have a history of asthma, but has not had an exacerbation over the past  several years.  He does not have an albuterol inhaler at home.  Patient states that sickle cell has been well controlled over the past few months. Patient denies fever, chills, dizziness, or shortness of breath.  No urinary symptoms, nausea, vomiting, or diarrhea.  No sick contacts, recent travel, or known exposure to COVID-19.  ER course: While in the ER, oxygen saturation 85-87%, improved on 2 L supplemental oxygen.  Vital signs otherwise unremarkable.  Complete blood count shows WBCs 14.3, hemoglobin 9.3 g/dL, and platelets [id number],000.  High-sensitivity troponin negative.  Complete metabolic panel shows AST 58 and total bilirubin 3.8, otherwise unremarkable.  Chest x-ray shows no acute cardiopulmonary process.  CT of chest pending.  COVID-19, influenza negative.  Patient's pain persists despite IV fluids, IV Dilaudid, and IV Toradol, patient thereby admitted to MedSurg for further management of sickle cell pain crisis.    Hospital Course:  Sickle cell disease with pain crisis: Patient was admitted for sickle cell pain crisis and managed appropriately with IVF, IV Dilaudid via PCA and IV Toradol, as well as other adjunct therapies per sickle cell pain management protocols.  IV Dilaudid PCA weaned appropriately.  Patient transition to oxycodone 10 mg every 4 hours as needed.  Medication sent to patient's pharmacy.  PDMP reviewed prior to sending opiate medications, no inconsistencies noted.  Respiratory failure with hypoxia: On admission, oxygen requirement 4 L/min.  Oxygen saturation decreasing to 70s with ambulation.  Improved on supplemental oxygen at 3 L.  Patient will go home with supplemental oxygen.  He will follow-up in clinic on 01/18/2022 for 6-minute walk test.  Community-acquired pneumonia: Patient completed Rocephin IV for total of 5 days.  No additional antibiotics warranted at discharge.   Patient was therefore discharged home today in a hemodynamically stable condition.   Jerrit will  follow-up with PCP within 1 week of this discharge. Dillin was counseled extensively about nonpharmacologic means of pain management, patient verbalized understanding and was appreciative of  the care received during this admission.   We discussed the need for good hydration, monitoring of hydration status, avoidance of heat, cold, stress, and infection triggers. We discussed the need to be adherent with taking Hydrea and other home medications. Patient was reminded of the need to seek medical attention immediately if any symptom of bleeding, anemia, or infection occurs.  Discharge Exam: Vitals:   01/11/22 1131 01/11/22 1320  BP:  121/61  Pulse:  88  Resp:  18  Temp:  98.8 F (37.1 C)  SpO2: (!) 79% 97%   Vitals:   01/11/22 0831 01/11/22 1041 01/11/22 1131 01/11/22 1320  BP:  122/73  121/61  Pulse:  84  88  Resp: 18 18  18   Temp:  98.2 F (36.8 C)  98.8 F (37.1 C)  TempSrc:  Oral  Oral  SpO2: 90% 94% (!) 79% 97%  Weight:      Height:        General appearance : Awake, alert, not in any distress. Speech Clear. Not toxic looking HEENT: Atraumatic and Normocephalic, pupils equally reactive to light and accomodation Neck: Supple, no JVD. No cervical lymphadenopathy.  Chest: Good air entry bilaterally, no added sounds  CVS: S1 S2 regular, no murmurs.  Abdomen: Bowel sounds present, Non tender and not distended with no gaurding, rigidity or rebound. Extremities: B/L Lower Ext shows no edema, both legs are warm to touch Neurology: Awake alert, and oriented X 3, CN II-XII intact, Non focal Skin: No Rash  Discharge Instructions  Discharge Instructions     Discharge patient   Complete by: As directed    Discharge disposition: 01-Home or Self Care   Discharge patient date: 01/11/2022      Allergies as of 01/11/2022       Reactions   Lisinopril Cough   Toradol [ketorolac Tromethamine] Itching        Medication List     TAKE these medications    acetaminophen 500 MG  tablet Commonly known as: TYLENOL Take 1,000 mg by mouth daily as needed for mild pain.   folic acid 1 MG tablet Commonly known as: FOLVITE Take 1 tablet (1 mg total) by mouth daily.   hydroxyurea 400 MG capsule Commonly known as: DROXIA Take 4 capsules (1,600 mg total) by mouth daily.   ibuprofen 800 MG tablet Commonly known as: ADVIL Take 1 tablet (800 mg total) by mouth every 8 (eight) hours as needed. What changed:  medication strength how much to take when to take this reasons to take this   losartan 50 MG tablet Commonly known as: COZAAR Take 1 tablet (50 mg total) by mouth daily.   Oxycodone HCl 10 MG Tabs Take 1 tablet (10 mg total) by mouth every 4 (four) hours as needed (pain).               Durable Medical Equipment  (From admission, onward)           Start     Ordered   01/11/22  1253  For home use only DME oxygen  Once       Question Answer Comment  Length of Need 6 Months   Mode or (Route) Nasal cannula   Liters per Minute 3   Oxygen conserving device No   Oxygen delivery system Gas      01/11/22 1254            The results of significant diagnostics from this hospitalization (including imaging, microbiology, ancillary and laboratory) are listed below for reference.    Significant Diagnostic Studies: CT Angio Chest PE W/Cm &/Or Wo Cm  Result Date: 01/06/2022 CLINICAL DATA:  Sickle cell crisis.  Chest pain. EXAM: CT ANGIOGRAPHY CHEST WITH CONTRAST TECHNIQUE: Multidetector CT imaging of the chest was performed using the standard protocol during bolus administration of intravenous contrast. Multiplanar CT image reconstructions and MIPs were obtained to evaluate the vascular anatomy. RADIATION DOSE REDUCTION: This exam was performed according to the departmental dose-optimization program which includes automated exposure control, adjustment of the mA and/or kV according to patient size and/or use of iterative reconstruction technique.  CONTRAST:  76mL OMNIPAQUE IOHEXOL 350 MG/ML SOLN COMPARISON:  12/01/2021 FINDINGS: Cardiovascular: Central pulmonary arteries are relatively well opacified. More peripheral pulmonary arteries show heterogeneous opacification limiting their assessment for pulmonary emboli. Allowing for this mild limitation, there is no evidence of a pulmonary embolism. Heart is mildly enlarged. No pericardial effusion. Prominent main pulmonary artery measuring 3.4 cm, stable. Normal aorta and arch branch vessels. Mediastinum/Nodes: No enlarged mediastinal, hilar, or axillary lymph nodes. Thyroid gland, trachea, and esophagus demonstrate no significant findings. Lungs/Pleura: Relatively low lung volumes. There are peribronchovascular opacities in both lower lobes, minimally in the dependent left upper lobe, consistent with atelectasis. Infection is not excluded and should be considered in the proper clinical setting. Remainder of the lungs is clear. No pleural effusion or pneumothorax. 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 No acute findings in the visualized upper abdomen. Musculoskeletal: Advanced skeletal changes sickle cell disease, stable from the prior CT. No acute fracture. Review of the MIP images confirms the above findings. IMPRESSION: 1. No evidence of a pulmonary embolism allowing for the mild technical limitation as detailed above. 2. Dependent peribronchovascular opacities in the lower lobes, minimally in the left upper lobe, likely atelectasis. Consider pneumonia if there are consistent clinical findings. 3. Spleen and skeletal findings reflecting chronic changes of sickle cell disease. Electronically Signed   By: Amie Portland M.D.   On: 01/06/2022 14:38   DG Chest Port 1 View  Result Date: 01/06/2022 CLINICAL DATA:  Cough and shortness of breath. EXAM: PORTABLE CHEST 1 VIEW COMPARISON:  06/11/2021 FINDINGS: 1024 hours. The lungs are clear without focal pneumonia, edema,  pneumothorax or pleural effusion. Cardiopericardial silhouette is at upper limits of normal for size. The visualized bony structures of the thorax show no acute abnormality. Telemetry leads overlie the chest. IMPRESSION: No active disease. Electronically Signed   By: Kennith Center M.D.   On: 01/06/2022 10:58    Microbiology: Recent Results (from the past 240 hour(s))  Resp Panel by RT-PCR (Flu A&B, Covid) Nasopharyngeal Swab     Status: None   Collection Time: 01/06/22  9:38 AM   Specimen: Nasopharyngeal Swab; Nasopharyngeal(NP) swabs in vial transport medium  Result Value Ref Range Status   SARS Coronavirus 2 by RT PCR NEGATIVE NEGATIVE Final    Comment: (NOTE) SARS-CoV-2 target nucleic acids are NOT DETECTED.  The SARS-CoV-2 RNA is generally detectable in upper respiratory  specimens during the acute phase of infection. The lowest concentration of SARS-CoV-2 viral copies this assay can detect is 138 copies/mL. A negative result does not preclude SARS-Cov-2 infection and should not be used as the sole basis for treatment or other patient management decisions. A negative result may occur with  improper specimen collection/handling, submission of specimen other than nasopharyngeal swab, presence of viral mutation(s) within the areas targeted by this assay, and inadequate number of viral copies(<138 copies/mL). A negative result must be combined with clinical observations, patient history, and epidemiological information. The expected result is Negative.  Fact Sheet for Patients:  BloggerCourse.com  Fact Sheet for Healthcare Providers:  SeriousBroker.it  This test is no t yet approved or cleared by the [place] FDA and  has been authorized for detection and/or diagnosis of SARS-CoV-2 by FDA under an Emergency Use Authorization (EUA). This EUA will remain  in effect (meaning this test can be used) for the duration of the COVID-19  declaration under Section 564(b)(1) of the Act, 21 U.S.C.section 360bbb-3(b)(1), unless the authorization is terminated  or revoked sooner.       Influenza A by PCR NEGATIVE NEGATIVE Final   Influenza B by PCR NEGATIVE NEGATIVE Final    Comment: (NOTE) The Xpert Xpress SARS-CoV-2/FLU/RSV plus assay is intended as an aid in the diagnosis of influenza from Nasopharyngeal swab specimens and should not be used as a sole basis for treatment. Nasal washings and aspirates are unacceptable for Xpert Xpress SARS-CoV-2/FLU/RSV testing.  Fact Sheet for Patients: BloggerCourse.com  Fact Sheet for Healthcare Providers: SeriousBroker.it  This test is not yet approved or cleared by the [place] FDA and has been authorized for detection and/or diagnosis of SARS-CoV-2 by FDA under an Emergency Use Authorization (EUA). This EUA will remain in effect (meaning this test can be used) for the duration of the COVID-19 declaration under Section 564(b)(1) of the Act, 21 U.S.C. section 360bbb-3(b)(1), unless the authorization is terminated or revoked.  Performed at [facility], [address] [place], [place] [address]      Labs: Basic Metabolic Panel: Recent Labs  Lab 01/06/22 0935 01/07/22 0502 01/08/22 0616 01/10/22 0844  NA 140 135 133* 139  K 4.1 3.8 3.7 4.0  CL 107 102 99 104  CO2 22 25 27 27   GLUCOSE 92 111* 112* 103*  BUN 11 10 10 12   CREATININE 0.53* 0.63 0.58* 0.53*  CALCIUM 9.1 8.8* 8.7* 8.5*   Liver Function Tests: Recent Labs  Lab 01/06/22 0935 01/10/22 0844  AST 58* 33  ALT 33 22  ALKPHOS 72 74  BILITOT 3.8* 3.5*  PROT 7.3 6.9  ALBUMIN 4.4 3.5   No results for input(s): LIPASE, AMYLASE in the last 168 hours. No results for input(s): AMMONIA in the last 168 hours. CBC: Recent Labs  Lab 01/06/22 0935 01/07/22 0502 01/08/22 0616 01/10/22 0844 01/11/22 0757  WBC 14.3* 15.1* 13.8* 12.6*  12.7*  NEUTROABS 6.0  --   --  7.1  --   HGB 9.3* 8.9* 8.4* 7.9* 7.1*  HCT 25.3* 23.6* 22.7* 21.8* 19.4*  MCV 82.7 80.0 81.1 82.9 81.2  PLT 380 358 311 313 343   Cardiac Enzymes: No results for input(s): CKTOTAL, CKMB, CKMBINDEX, TROPONINI in the last 168 hours. BNP: Invalid input(s): POCBNP CBG: No results for input(s): GLUCAP in the last 168 hours.  Time coordinating discharge: 30 minutes  Signed: Nolon Nations  APRN, MSN, FNP-C Patient Care Center [facility] [address]  [address]  [place], [place] [address] [phone]  [facility] 01/11/2022, 2:13 PM

## 2022-01-17 ENCOUNTER — Other Ambulatory Visit: Payer: Self-pay

## 2022-01-17 ENCOUNTER — Encounter: Payer: Self-pay | Admitting: Family Medicine

## 2022-01-17 ENCOUNTER — Ambulatory Visit (INDEPENDENT_AMBULATORY_CARE_PROVIDER_SITE_OTHER): Payer: Medicaid Other | Admitting: Family Medicine

## 2022-01-17 VITALS — BP 111/62 | HR 85 | Temp 98.4°F | Ht 67.0 in | Wt 191.0 lb

## 2022-01-17 DIAGNOSIS — G894 Chronic pain syndrome: Secondary | ICD-10-CM | POA: Diagnosis not present

## 2022-01-17 DIAGNOSIS — E559 Vitamin D deficiency, unspecified: Secondary | ICD-10-CM | POA: Diagnosis not present

## 2022-01-17 DIAGNOSIS — D571 Sickle-cell disease without crisis: Secondary | ICD-10-CM | POA: Diagnosis not present
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
## 2022-01-18 ENCOUNTER — Other Ambulatory Visit: Payer: Self-pay | Admitting: Family Medicine

## 2022-01-18 DIAGNOSIS — E559 Vitamin D deficiency, unspecified: Secondary | ICD-10-CM

## 2022-01-18 LAB — DRUG SCREEN 764883 11+OXYCO+ALC+CRT-BUND

## 2022-01-18 MED ORDER — ERGOCALCIFEROL 1.25 MG (50000 UT) PO CAPS: 50000.0000 [IU] | ORAL_CAPSULE | ORAL | 2 refills | Status: DC

## 2022-01-23 LAB — CMP14+CBC/D/PLT+FER+RETIC+V...
ALT: 26 IU/L (ref 0–44)
AST: 29 IU/L (ref 0–40)
Albumin/Globulin Ratio: 1.4 (ref 1.2–2.2)
Albumin: 4.2 g/dL (ref 4.1–5.2)
Alkaline Phosphatase: 93 IU/L (ref 44–121)
BUN/Creatinine Ratio: 14 (ref 9–20)
BUN: 10 mg/dL (ref 6–20)
Bilirubin Total: 3 mg/dL — ABNORMAL HIGH (ref 0.0–1.2)
CO2: 23 mmol/L (ref 20–29)
Calcium: 9.3 mg/dL (ref 8.7–10.2)
Chloride: 101 mmol/L (ref 96–106)
Creatinine, Ser: 0.74 mg/dL — ABNORMAL LOW (ref 0.76–1.27)
Ferritin: 1127 ng/mL — ABNORMAL HIGH (ref 30–400)
Globulin, Total: 2.9 g/dL (ref 1.5–4.5)
Glucose: 85 mg/dL (ref 70–99)
Potassium: 5.1 mmol/L (ref 3.5–5.2)
Sodium: 138 mmol/L (ref 134–144)
Total Protein: 7.1 g/dL (ref 6.0–8.5)
Vit D, 25-Hydroxy: 9.5 ng/mL — ABNORMAL LOW (ref 30.0–100.0)
eGFR: 131 mL/min/{1.73_m2} (ref >59)

## 2022-03-11 ENCOUNTER — Other Ambulatory Visit: Payer: Self-pay | Admitting: Family Medicine

## 2022-03-11 ENCOUNTER — Telehealth: Payer: Self-pay | Admitting: Family Medicine

## 2022-03-11 DIAGNOSIS — G894 Chronic pain syndrome: Secondary | ICD-10-CM

## 2022-03-11 DIAGNOSIS — D571 Sickle-cell disease without crisis: Secondary | ICD-10-CM

## 2022-03-11 DIAGNOSIS — F1190 Opioid use, unspecified, uncomplicated: Secondary | ICD-10-CM

## 2022-03-11 MED ORDER — OXYCODONE HCL 10 MG PO TABS: 10.0000 mg | ORAL_TABLET | ORAL | 0 refills | Status: DC | PRN

## 2022-03-11 NOTE — Progress Notes (Signed)
Reviewed PDMP substance reporting system prior to prescribing opiate medications. No inconsistencies noted.   Meds ordered this encounter  Medications  . Oxycodone HCl 10 MG TABS    Sig: Take 1 tablet (10 mg total) by mouth every 4 (four) hours as needed (pain).    Dispense:  60 tablet    Refill:  0    Order Specific Question:   Supervising Provider    Answer:   JEGEDE, OLUGBEMIGA E [[id number]]     Jaleiyah Alas Moore Minoru Chap  APRN, MSN, FNP-C [facility] [address]  [place], [place] [address] [phone]  

## 2022-03-11 NOTE — Telephone Encounter (Signed)
Oxycodone refill request.

## 2022-03-13 ENCOUNTER — Telehealth (HOSPITAL_COMMUNITY): Payer: Self-pay | Admitting: General Practice

## 2022-03-13 ENCOUNTER — Encounter (HOSPITAL_COMMUNITY): Payer: Self-pay | Admitting: Family Medicine

## 2022-03-13 ENCOUNTER — Non-Acute Institutional Stay (HOSPITAL_COMMUNITY)
Admission: AD | Admit: 2022-03-13 | Discharge: 2022-03-13 | Disposition: A | Discharge status: Home / Self Care | Payer: Medicaid Other | Source: Ambulatory Visit | Attending: Internal Medicine | Admitting: Internal Medicine

## 2022-03-13 DIAGNOSIS — D5700 Hb-SS disease with crisis, unspecified: Secondary | ICD-10-CM | POA: Diagnosis present

## 2022-03-13 DIAGNOSIS — J4520 Mild intermittent asthma, uncomplicated: Secondary | ICD-10-CM | POA: Diagnosis not present

## 2022-03-13 DIAGNOSIS — G894 Chronic pain syndrome: Secondary | ICD-10-CM | POA: Diagnosis not present

## 2022-03-13 DIAGNOSIS — Z79891 Long term (current) use of opiate analgesic: Secondary | ICD-10-CM | POA: Insufficient documentation

## 2022-03-13 LAB — COMPREHENSIVE METABOLIC PANEL
ALT: 20 U/L (ref 0–44)
AST: 40 U/L (ref 15–41)
Albumin: 4.3 g/dL (ref 3.5–5.0)
Alkaline Phosphatase: 73 U/L (ref 38–126)
Anion gap: 7 (ref 5–15)
BUN: 6 mg/dL (ref 6–20)
CO2: 26 mmol/L (ref 22–32)
Calcium: 9 mg/dL (ref 8.9–10.3)
Chloride: 104 mmol/L (ref 98–111)
Creatinine, Ser: 0.57 mg/dL — ABNORMAL LOW (ref 0.61–1.24)
GFR, Estimated: >60 mL/min (ref >60)
Glucose, Bld: 96 mg/dL (ref 70–99)
Potassium: 3.5 mmol/L (ref 3.5–5.1)
Sodium: 137 mmol/L (ref 135–145)
Total Bilirubin: 4.8 mg/dL — ABNORMAL HIGH (ref 0.3–1.2)
Total Protein: 7 g/dL (ref 6.5–8.1)

## 2022-03-13 LAB — CBC WITH DIFFERENTIAL/PLATELET
Abs Immature Granulocytes: 0.07 10*3/uL (ref 0.00–0.07)
Basophils Absolute: 0.1 10*3/uL (ref 0.0–0.1)
Basophils Relative: 1 %
Eosinophils Absolute: 0.1 10*3/uL (ref 0.0–0.5)
Eosinophils Relative: 1 %
HCT: 28.6 % — ABNORMAL LOW (ref 39.0–52.0)
Hemoglobin: 10.5 g/dL — ABNORMAL LOW (ref 13.0–17.0)
Immature Granulocytes: 1 %
Lymphocytes Relative: 29 %
Lymphs Abs: 3.8 10*3/uL (ref 0.7–4.0)
MCH: 30.6 pg (ref 26.0–34.0)
MCHC: 36.7 g/dL — ABNORMAL HIGH (ref 30.0–36.0)
MCV: 83.4 fL (ref 80.0–100.0)
Monocytes Absolute: 1.9 10*3/uL — ABNORMAL HIGH (ref 0.1–1.0)
Monocytes Relative: 14 %
Neutro Abs: 7.2 10*3/uL (ref 1.7–7.7)
Neutrophils Relative %: 54 %
Platelets: 379 10*3/uL (ref 150–400)
RBC: 3.43 MIL/uL — ABNORMAL LOW (ref 4.22–5.81)
RDW: 19.9 % — ABNORMAL HIGH (ref 11.5–15.5)
WBC: 13.2 10*3/uL — ABNORMAL HIGH (ref 4.0–10.5)
nRBC: 2.4 % — ABNORMAL HIGH (ref 0.0–0.2)

## 2022-03-13 LAB — RETICULOCYTES
Immature Retic Fract: 49.2 % — ABNORMAL HIGH (ref 2.3–15.9)
RBC.: 3.43 MIL/uL — ABNORMAL LOW (ref 4.22–5.81)
Retic Count, Absolute: 247 10*3/uL — ABNORMAL HIGH (ref 19.0–186.0)
Retic Ct Pct: 7.2 % — ABNORMAL HIGH (ref 0.4–3.1)

## 2022-03-13 MED ORDER — HYDROMORPHONE 1 MG/ML IV SOLN
INTRAVENOUS | Status: DC
  Administered 2022-03-13: 5 mg via INTRAVENOUS
  Filled 2022-03-13: qty 30

## 2022-03-13 MED ORDER — ONDANSETRON HCL 4 MG/2ML IJ SOLN
4.0000 mg | Freq: Four times a day (QID) | INTRAMUSCULAR | Status: DC | PRN
  Filled 2022-03-13: qty 2

## 2022-03-13 MED ORDER — SODIUM CHLORIDE 0.45 % IV SOLN: INTRAVENOUS | Status: DC

## 2022-03-13 MED ORDER — ACETAMINOPHEN 500 MG PO TABS
1000.0000 mg | ORAL_TABLET | Freq: Once | ORAL | Status: AC
  Administered 2022-03-13: 1000 mg via ORAL
  Filled 2022-03-13: qty 2

## 2022-03-13 MED ORDER — KETOROLAC TROMETHAMINE 30 MG/ML IJ SOLN
15.0000 mg | Freq: Once | INTRAMUSCULAR | Status: AC
Start: 2022-03-13 — End: 2022-03-13
  Administered 2022-03-13: 15 mg via INTRAVENOUS
  Filled 2022-03-13: qty 1

## 2022-03-13 MED ORDER — DIPHENHYDRAMINE HCL 25 MG PO CAPS
25.0000 mg | ORAL_CAPSULE | Freq: Once | ORAL | Status: AC
Start: 2022-03-13 — End: 2022-03-13
  Administered 2022-03-13: 25 mg via ORAL
  Filled 2022-03-13: qty 1

## 2022-03-13 MED ORDER — SODIUM CHLORIDE 0.9% FLUSH: 9.0000 mL | INTRAVENOUS | Status: DC | PRN

## 2022-03-13 MED ORDER — DIPHENHYDRAMINE HCL 25 MG PO CAPS: 25.0000 mg | ORAL_CAPSULE | ORAL | Status: DC | PRN

## 2022-03-13 MED ORDER — NALOXONE HCL 0.4 MG/ML IJ SOLN: 0.4000 mg | INTRAMUSCULAR | Status: DC | PRN

## 2022-03-13 NOTE — Telephone Encounter (Signed)
Patient called, requesting to come to the day hospital due to generalized pain rated at 6/10. Denied chest pain, fever, diarrhea, abdominal pain, nausea/vomiting and priapism. Screened negative for Covid-19 symptoms. Admitted to having means of transportation ("grandma") without driving self after treatment. Last took 20 mg of Oxycodone at 02:00 am and 800 mg of Ibuprofen at 05:00 am today. Per provider, patient can come to the day hospital for treatment. Patient notified, verbalized understanding.  ?

## 2022-03-13 NOTE — Discharge Summary (Signed)
[facility] Discharge Summary  ? ?Patient ID: ?[name] ?MRN: [id number] ?DOB/AGE: 2001/01/04 21 y.o. ? ?Admit date: 03/13/2022 ?Discharge date: 03/13/2022 ? ?Primary Care Physician:  Massie Maroon, FNP ? ?Admission Diagnoses:  ?Principal Problem: ?  Sickle cell pain crisis (HCC) ? ? ?Discharge Medications: ? ?Allergies as of 03/13/2022   ? ?   Reactions  ? Lisinopril Cough  ? Toradol [ketorolac Tromethamine] Itching  ? ?  ? ?  ?Medication List  ?  ? ?TAKE these medications   ? ?acetaminophen 500 MG tablet ?Commonly known as: TYLENOL ?Take 1,000 mg by mouth daily as needed for mild pain. ?  ?ergocalciferol 1.25 MG (50000 UT) capsule ?Commonly known as: Drisdol ?Take 1 capsule (50,000 Units total) by mouth once a week. ?  ?folic acid 1 MG tablet ?Commonly known as: FOLVITE ?Take 1 tablet (1 mg total) by mouth daily. ?  ?hydroxyurea 400 MG capsule ?Commonly known as: DROXIA ?Take 4 capsules (1,600 mg total) by mouth daily. ?  ?ibuprofen 800 MG tablet ?Commonly known as: ADVIL ?Take 1 tablet (800 mg total) by mouth every 8 (eight) hours as needed. ?  ?losartan 50 MG tablet ?Commonly known as: COZAAR ?Take 1 tablet (50 mg total) by mouth daily. ?  ?Oxycodone HCl 10 MG Tabs ?Take 1 tablet (10 mg total) by mouth every 4 (four) hours as needed (pain). ?  ? ?  ? ? ? ?Consults:  None ? ?Significant Diagnostic Studies:  ?No results found. ? ?History of present illness: ?Quantavius Humm is a very pleasant 21 year old male with a medical history significant for sickle cell disease, chronic pain syndrome, and mild intermittent asthma that presents to sickle cell day infusion center with complaints of generalized pain over the past 2 days.  Patient states that low back and lower extremity pain is consistent with his previous sickle cell crisis.  He states that 2 days ago he awakened with intense pain to his lower extremities.  Pain was unrelieved by oxycodone and ibuprofen.  Patient states that he has been  taking these medications as prescribed without any relief.  He last had oxycodone around 3 AM.  Patient endorses headache.  He denies fever, chills, chest pain, shortness of breath, urinary symptoms, nausea, vomiting, or diarrhea.  He has had no recent travel or sick contacts. ? ?[facility] Course: ?Patient admitted to sickle cell day infusion clinic for management of his pain crisis.  Reviewed all laboratory values, WBCs 13.5, appears to be chronic.  Patient has remained afebrile.  CMP shows an elevation in total bilirubin, and is otherwise unremarkable. ?Patient's pain managed with IV Dilaudid PCA ?Toradol 15 mg IV x1 ?Tylenol 1000 mg x 1 ?IV fluids, 0.45% saline at 100 mL/h. ?Patient's pain intensity decreased to 3 out of 10.  He is requesting discharge home.  He states that he can manage at home with his pain medications.  He is alert, oriented, and ambulating without assistance.  Patient will discharge home in a hemodynamically stable condition. ? ?Discharge instructions: ?Resume all home medications.  ? ?Follow up with PCP as previously  scheduled.  ? ?Discussed the importance of drinking 64 ounces of water daily, dehydration of red blood cells may lead further sickling.  ? ?Avoid all stressors that precipitate sickle cell pain crisis.   ? ? ?The patient was given clear instructions to go to ER or return to medical center if symptoms do not improve, worsen or new problems develop.  ? ? ?  Physical Exam at Discharge: ? ?BP 133/70 (BP Location: Right Arm)   Pulse 65   Temp 98.1 ?F (36.7 ?C) (Temporal)   Resp 16   SpO2 97%  ? ?Physical Exam ?Constitutional:   ?   Appearance: Normal appearance. He is obese.  ?Eyes:  ?   Pupils: Pupils are equal, round, and reactive to light.  ?Cardiovascular:  ?   Rate and Rhythm: Normal rate and regular rhythm.  ?   Pulses: Normal pulses.  ?Pulmonary:  ?   Effort: Pulmonary effort is normal.  ?Abdominal:  ?   General: Bowel sounds are normal.  ?Skin: ?    General: Skin is warm.  ?Neurological:  ?   Mental Status: He is alert.  ?Psychiatric:     ?   Mood and Affect: Mood normal.  ? ? ? ?Disposition at Discharge: ?Discharge disposition: 01-Home or Self Care ? ? ? ? ? ? ?Discharge Orders: ?Discharge Instructions   ? ? Discharge patient   Complete by: As directed ?  ? Discharge disposition: 01-Home or Self Care  ? Discharge patient date: 03/13/2022  ? ?  ? ? ?Condition at Discharge:   Stable ? ?Time spent on Discharge:  Greater than 30 minutes. ? ?Signed: ?Nolon Nations  APRN, MSN, FNP-C ?[facility] ?[facility] ?[address]  ?[place], [place] [address] ?[phone] ? ?03/13/2022, 10:54 PM ?
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
## 2022-03-13 NOTE — Progress Notes (Signed)
Pt admitted to the day hospital for treatment of sickle cell pain crisis. Pt reported generalized pain  rated 6/10. Pt given PO Benadryl and Tylenol, placed on Dilaudid PCA, and hydrated with IV fluids. At discharge patient reported pain a 2/10. Pt declined AVS. Pt alert , oriented and ambulatory at discharge.   ?

## 2022-03-28 ENCOUNTER — Inpatient Hospital Stay (HOSPITAL_COMMUNITY)
Admission: EM | Admit: 2022-03-28 | Discharge: 2022-04-01 | DRG: 812 | Disposition: A | Discharge status: Home / Self Care | Payer: Medicaid Other | Attending: Internal Medicine | Admitting: Internal Medicine

## 2022-03-28 ENCOUNTER — Other Ambulatory Visit: Payer: Self-pay

## 2022-03-28 ENCOUNTER — Encounter (HOSPITAL_COMMUNITY): Payer: Self-pay

## 2022-03-28 ENCOUNTER — Inpatient Hospital Stay (HOSPITAL_COMMUNITY): Payer: Medicaid Other

## 2022-03-28 ENCOUNTER — Emergency Department (HOSPITAL_COMMUNITY): Payer: Medicaid Other

## 2022-03-28 DIAGNOSIS — I10 Essential (primary) hypertension: Secondary | ICD-10-CM | POA: Diagnosis present

## 2022-03-28 DIAGNOSIS — D72829 Elevated white blood cell count, unspecified: Secondary | ICD-10-CM | POA: Diagnosis present

## 2022-03-28 DIAGNOSIS — Z888 Allergy status to other drugs, medicaments and biological substances status: Secondary | ICD-10-CM | POA: Diagnosis not present

## 2022-03-28 DIAGNOSIS — J4520 Mild intermittent asthma, uncomplicated: Secondary | ICD-10-CM | POA: Diagnosis present

## 2022-03-28 DIAGNOSIS — Z79899 Other long term (current) drug therapy: Secondary | ICD-10-CM

## 2022-03-28 DIAGNOSIS — Z8249 Family history of ischemic heart disease and other diseases of the circulatory system: Secondary | ICD-10-CM | POA: Diagnosis not present

## 2022-03-28 DIAGNOSIS — Z832 Family history of diseases of the blood and blood-forming organs and certain disorders involving the immune mechanism: Secondary | ICD-10-CM | POA: Diagnosis not present

## 2022-03-28 DIAGNOSIS — J9601 Acute respiratory failure with hypoxia: Secondary | ICD-10-CM | POA: Diagnosis not present

## 2022-03-28 DIAGNOSIS — Z8701 Personal history of pneumonia (recurrent): Secondary | ICD-10-CM | POA: Diagnosis not present

## 2022-03-28 DIAGNOSIS — Z20822 Contact with and (suspected) exposure to covid-19: Secondary | ICD-10-CM | POA: Diagnosis present

## 2022-03-28 DIAGNOSIS — D5700 Hb-SS disease with crisis, unspecified: Secondary | ICD-10-CM | POA: Diagnosis present

## 2022-03-28 DIAGNOSIS — M26609 Unspecified temporomandibular joint disorder, unspecified side: Secondary | ICD-10-CM | POA: Diagnosis present

## 2022-03-28 DIAGNOSIS — F1120 Opioid dependence, uncomplicated: Secondary | ICD-10-CM | POA: Diagnosis present

## 2022-03-28 DIAGNOSIS — G894 Chronic pain syndrome: Secondary | ICD-10-CM | POA: Diagnosis present

## 2022-03-28 DIAGNOSIS — D638 Anemia in other chronic diseases classified elsewhere: Secondary | ICD-10-CM

## 2022-03-28 DIAGNOSIS — R0902 Hypoxemia: Secondary | ICD-10-CM | POA: Diagnosis present

## 2022-03-28 DIAGNOSIS — J45909 Unspecified asthma, uncomplicated: Secondary | ICD-10-CM | POA: Diagnosis present

## 2022-03-28 DIAGNOSIS — D5701 Hb-SS disease with acute chest syndrome: Secondary | ICD-10-CM | POA: Diagnosis present

## 2022-03-28 LAB — LACTATE DEHYDROGENASE: LDH: 595 U/L — ABNORMAL HIGH (ref 98–192)

## 2022-03-28 LAB — RETICULOCYTES
Immature Retic Fract: 46 % — ABNORMAL HIGH (ref 2.3–15.9)
RBC.: 3.15 MIL/uL — ABNORMAL LOW (ref 4.22–5.81)
Retic Count, Absolute: 196.6 10*3/uL — ABNORMAL HIGH (ref 19.0–186.0)
Retic Ct Pct: 6.2 % — ABNORMAL HIGH (ref 0.4–3.1)

## 2022-03-28 LAB — COMPREHENSIVE METABOLIC PANEL
ALT: 26 U/L (ref 0–44)
AST: 75 U/L — ABNORMAL HIGH (ref 15–41)
Albumin: 4.3 g/dL (ref 3.5–5.0)
Alkaline Phosphatase: 78 U/L (ref 38–126)
Anion gap: 9 (ref 5–15)
BUN: 9 mg/dL (ref 6–20)
CO2: 24 mmol/L (ref 22–32)
Calcium: 9.3 mg/dL (ref 8.9–10.3)
Chloride: 108 mmol/L (ref 98–111)
Creatinine, Ser: 0.64 mg/dL (ref 0.61–1.24)
GFR, Estimated: >60 mL/min (ref >60)
Glucose, Bld: 116 mg/dL — ABNORMAL HIGH (ref 70–99)
Potassium: 4.9 mmol/L (ref 3.5–5.1)
Sodium: 141 mmol/L (ref 135–145)
Total Bilirubin: 3.8 mg/dL — ABNORMAL HIGH (ref 0.3–1.2)
Total Protein: 7.5 g/dL (ref 6.5–8.1)

## 2022-03-28 LAB — CBC WITH DIFFERENTIAL/PLATELET
Abs Immature Granulocytes: 1.02 10*3/uL — ABNORMAL HIGH (ref 0.00–0.07)
Basophils Absolute: 0.2 10*3/uL — ABNORMAL HIGH (ref 0.0–0.1)
Basophils Relative: 1 %
Eosinophils Absolute: 0.2 10*3/uL (ref 0.0–0.5)
Eosinophils Relative: 1 %
HCT: 25.9 % — ABNORMAL LOW (ref 39.0–52.0)
Hemoglobin: 9.7 g/dL — ABNORMAL LOW (ref 13.0–17.0)
Immature Granulocytes: 6 %
Lymphocytes Relative: 27 %
Lymphs Abs: 4.3 10*3/uL — ABNORMAL HIGH (ref 0.7–4.0)
MCH: 30.7 pg (ref 26.0–34.0)
MCHC: 37.5 g/dL — ABNORMAL HIGH (ref 30.0–36.0)
MCV: 82 fL (ref 80.0–100.0)
Monocytes Absolute: 1.4 10*3/uL — ABNORMAL HIGH (ref 0.1–1.0)
Monocytes Relative: 9 %
Neutro Abs: 9.1 10*3/uL — ABNORMAL HIGH (ref 1.7–7.7)
Neutrophils Relative %: 56 %
Platelets: 409 10*3/uL — ABNORMAL HIGH (ref 150–400)
RBC: 3.16 MIL/uL — ABNORMAL LOW (ref 4.22–5.81)
RDW: 19.9 % — ABNORMAL HIGH (ref 11.5–15.5)
WBC: 16.2 10*3/uL — ABNORMAL HIGH (ref 4.0–10.5)
nRBC: 0.9 % — ABNORMAL HIGH (ref 0.0–0.2)

## 2022-03-28 LAB — RESP PANEL BY RT-PCR (FLU A&B, COVID) ARPGX2
Influenza A by PCR: NEGATIVE
Influenza B by PCR: NEGATIVE
SARS Coronavirus 2 by RT PCR: NEGATIVE

## 2022-03-28 MED ORDER — HYDROMORPHONE HCL 1 MG/ML IJ SOLN: 0.5000 mg | INTRAMUSCULAR | Status: DC

## 2022-03-28 MED ORDER — SODIUM CHLORIDE 0.45 % IV SOLN: INTRAVENOUS | Status: DC

## 2022-03-28 MED ORDER — SODIUM CHLORIDE 0.9 % IV SOLN
12.5000 mg | Freq: Once | INTRAVENOUS | Status: AC
  Administered 2022-03-28: 12.5 mg via INTRAVENOUS
  Filled 2022-03-28: qty 12.5

## 2022-03-28 MED ORDER — ALBUTEROL SULFATE (2.5 MG/3ML) 0.083% IN NEBU: 2.5000 mg | INHALATION_SOLUTION | RESPIRATORY_TRACT | Status: DC | PRN

## 2022-03-28 MED ORDER — KETOROLAC TROMETHAMINE 15 MG/ML IJ SOLN
15.0000 mg | Freq: Four times a day (QID) | INTRAMUSCULAR | Status: DC
  Administered 2022-03-28 – 2022-04-01 (×15): 15 mg via INTRAVENOUS
  Filled 2022-03-28 (×15): qty 1

## 2022-03-28 MED ORDER — HYDROMORPHONE HCL 2 MG/ML IJ SOLN
2.0000 mg | INTRAMUSCULAR | Status: AC
  Administered 2022-03-28: 2 mg via INTRAVENOUS
  Filled 2022-03-28: qty 1

## 2022-03-28 MED ORDER — DIPHENHYDRAMINE HCL 25 MG PO CAPS
25.0000 mg | ORAL_CAPSULE | ORAL | Status: DC | PRN
  Administered 2022-03-30: 25 mg via ORAL
  Filled 2022-03-28: qty 1

## 2022-03-28 MED ORDER — SODIUM CHLORIDE 0.9% FLUSH: 9.0000 mL | INTRAVENOUS | Status: DC | PRN

## 2022-03-28 MED ORDER — MAGNESIUM SULFATE 2 GM/50ML IV SOLN
2.0000 g | Freq: Once | INTRAVENOUS | Status: AC
  Administered 2022-03-28: 2 g via INTRAVENOUS
  Filled 2022-03-28: qty 50

## 2022-03-28 MED ORDER — IOHEXOL 350 MG/ML SOLN
75.0000 mL | Freq: Once | INTRAVENOUS | Status: AC | PRN
  Administered 2022-03-28: 75 mL via INTRAVENOUS

## 2022-03-28 MED ORDER — ALBUTEROL SULFATE (2.5 MG/3ML) 0.083% IN NEBU
2.5000 mg | INHALATION_SOLUTION | Freq: Once | RESPIRATORY_TRACT | Status: AC
  Administered 2022-03-28: 2.5 mg via RESPIRATORY_TRACT
  Filled 2022-03-28: qty 3

## 2022-03-28 MED ORDER — SENNOSIDES-DOCUSATE SODIUM 8.6-50 MG PO TABS
1.0000 | ORAL_TABLET | Freq: Two times a day (BID) | ORAL | Status: DC
  Administered 2022-03-28 – 2022-04-01 (×7): 1 via ORAL
  Filled 2022-03-28 (×7): qty 1

## 2022-03-28 MED ORDER — ONDANSETRON HCL 4 MG/2ML IJ SOLN
4.0000 mg | INTRAMUSCULAR | Status: DC | PRN
  Administered 2022-03-28: 4 mg via INTRAVENOUS
  Filled 2022-03-28: qty 2

## 2022-03-28 MED ORDER — HYDROMORPHONE HCL 1 MG/ML IJ SOLN
1.0000 mg | Freq: Once | INTRAMUSCULAR | Status: AC
  Administered 2022-03-28: 1 mg via INTRAVENOUS

## 2022-03-28 MED ORDER — ENOXAPARIN SODIUM 40 MG/0.4ML IJ SOSY
40.0000 mg | PREFILLED_SYRINGE | INTRAMUSCULAR | Status: DC
  Filled 2022-03-28 (×2): qty 0.4

## 2022-03-28 MED ORDER — HYDROMORPHONE 1 MG/ML IV SOLN
INTRAVENOUS | Status: DC
  Administered 2022-03-28: 30 mg via INTRAVENOUS
  Administered 2022-03-28: 7.5 mg via INTRAVENOUS
  Administered 2022-03-28: 2 mg via INTRAVENOUS
  Administered 2022-03-29: 1.5 mg via INTRAVENOUS
  Administered 2022-03-29: 0.5 mg via INTRAVENOUS
  Administered 2022-03-29 (×2): 2.5 mg via INTRAVENOUS
  Administered 2022-03-29: 1 mg via INTRAVENOUS
  Administered 2022-03-30 (×2): 2.5 mg via INTRAVENOUS
  Administered 2022-03-30: 0.5 mg via INTRAVENOUS
  Administered 2022-03-30: 30 mg via INTRAVENOUS
  Administered 2022-03-30: 2 mg via INTRAVENOUS
  Administered 2022-03-30: 1.5 mg via INTRAVENOUS
  Filled 2022-03-28 (×2): qty 30

## 2022-03-28 MED ORDER — SODIUM CHLORIDE 0.45 % IV SOLN: INTRAVENOUS | Status: AC

## 2022-03-28 MED ORDER — LOSARTAN POTASSIUM 50 MG PO TABS: 50.0000 mg | ORAL_TABLET | Freq: Every day | ORAL | Status: DC

## 2022-03-28 MED ORDER — POLYETHYLENE GLYCOL 3350 17 G PO PACK
17.0000 g | PACK | Freq: Every day | ORAL | Status: DC | PRN
  Administered 2022-03-31: 17 g via ORAL
  Filled 2022-03-28: qty 1

## 2022-03-28 MED ORDER — HYDROMORPHONE HCL 2 MG/ML IJ SOLN
2.0000 mg | Freq: Once | INTRAMUSCULAR | Status: AC
  Administered 2022-03-28: 2 mg via INTRAVENOUS
  Filled 2022-03-28: qty 1

## 2022-03-28 MED ORDER — OXYCODONE HCL 5 MG PO TABS
10.0000 mg | ORAL_TABLET | ORAL | Status: DC | PRN
  Administered 2022-03-28 – 2022-03-29 (×3): 10 mg via ORAL
  Filled 2022-03-28 (×3): qty 2

## 2022-03-28 MED ORDER — NALOXONE HCL 0.4 MG/ML IJ SOLN: 0.4000 mg | INTRAMUSCULAR | Status: DC | PRN

## 2022-03-28 MED ORDER — FOLIC ACID 1 MG PO TABS
1.0000 mg | ORAL_TABLET | Freq: Every day | ORAL | Status: DC
  Administered 2022-03-28 – 2022-04-01 (×4): 1 mg via ORAL
  Filled 2022-03-28 (×4): qty 1

## 2022-03-28 NOTE — ED Notes (Signed)
Pt oxygen saturations noted to be 91% on 3L, increased to 4L.  ?
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
## 2022-03-28 NOTE — ED Notes (Signed)
Pt placed on 2L [place] for oxygen saturations of 88-89% on RA. Saturations improved to 92%. Increased oxygen to 3L [place] with improvement to 96%. ?

## 2022-03-28 NOTE — ED Triage Notes (Signed)
Patient c/o sickle cell pain "all over" since 0500 today. ?
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
## 2022-03-29 LAB — HEPATIC FUNCTION PANEL
ALT: 29 U/L (ref 0–44)
AST: 77 U/L — ABNORMAL HIGH (ref 15–41)
Albumin: 4 g/dL (ref 3.5–5.0)
Alkaline Phosphatase: 60 U/L (ref 38–126)
Bilirubin, Direct: 0.6 mg/dL — ABNORMAL HIGH (ref 0.0–0.2)
Indirect Bilirubin: 3.1 mg/dL — ABNORMAL HIGH (ref 0.3–0.9)
Total Bilirubin: 3.7 mg/dL — ABNORMAL HIGH (ref 0.3–1.2)
Total Protein: 7.1 g/dL (ref 6.5–8.1)

## 2022-03-29 LAB — BASIC METABOLIC PANEL
Anion gap: 10 (ref 5–15)
BUN: 11 mg/dL (ref 6–20)
CO2: 24 mmol/L (ref 22–32)
Calcium: 8.8 mg/dL — ABNORMAL LOW (ref 8.9–10.3)
Chloride: 102 mmol/L (ref 98–111)
Creatinine, Ser: 0.74 mg/dL (ref 0.61–1.24)
GFR, Estimated: >60 mL/min (ref >60)
Glucose, Bld: 115 mg/dL — ABNORMAL HIGH (ref 70–99)
Potassium: 4.3 mmol/L (ref 3.5–5.1)
Sodium: 136 mmol/L (ref 135–145)

## 2022-03-29 LAB — CBC
HCT: 24.7 % — ABNORMAL LOW (ref 39.0–52.0)
Hemoglobin: 9.2 g/dL — ABNORMAL LOW (ref 13.0–17.0)
MCH: 30.5 pg (ref 26.0–34.0)
MCHC: 37.2 g/dL — ABNORMAL HIGH (ref 30.0–36.0)
MCV: 81.8 fL (ref 80.0–100.0)
Platelets: 419 10*3/uL — ABNORMAL HIGH (ref 150–400)
RBC: 3.02 MIL/uL — ABNORMAL LOW (ref 4.22–5.81)
RDW: 21.2 % — ABNORMAL HIGH (ref 11.5–15.5)
WBC: 16.4 10*3/uL — ABNORMAL HIGH (ref 4.0–10.5)
nRBC: 2.9 % — ABNORMAL HIGH (ref 0.0–0.2)

## 2022-03-29 LAB — PREPARE RBC (CROSSMATCH)

## 2022-03-29 MED ORDER — ACETAMINOPHEN 325 MG PO TABS
650.0000 mg | ORAL_TABLET | Freq: Once | ORAL | Status: AC
  Administered 2022-03-29: 650 mg via ORAL
  Filled 2022-03-29: qty 2

## 2022-03-29 MED ORDER — DIPHENHYDRAMINE HCL 50 MG/ML IJ SOLN
25.0000 mg | Freq: Once | INTRAMUSCULAR | Status: AC
  Administered 2022-03-29: 25 mg via INTRAVENOUS
  Filled 2022-03-29: qty 1

## 2022-03-29 MED ORDER — SODIUM CHLORIDE 0.9% IV SOLUTION: Freq: Once | INTRAVENOUS | Status: DC

## 2022-03-29 NOTE — Progress Notes (Signed)
Subjective: ?[name] Hill is a 21 year old male with a medical history significant for sickle cell disease, chronic pain syndrome, opiate dependence and tolerance, history of anemia of chronic disease, and mild intermittent asthma was admitted for acute chest syndrome in the setting of sickle cell crisis. ?Chest CT showed no evidence of pulmonary embolism, chronic parenchymal lung changes of sickle cell disease with a new bandage opacity in the right lower lobe which may reflect acute chest.  Patient continues to have an oxygen requirement of 4 L/min. ?Patient continues to have severe pain to low back and bilateral lower extremities.  He denies headache, chest pain, urinary symptoms, nausea, vomiting, or diarrhea.  Patient endorses some shortness of breath. ? ?Objective: ? ?Vital signs in last 24 hours: ? ?Vitals:  ? 03/29/22 0846 03/29/22 1018 03/29/22 1408 03/29/22 1416  ?BP:  132/69 127/67 (!) 165/81  ?Pulse:  94 95 85  ?Resp: 17 18 20 18   ?Temp:  98.8 ?F (37.1 ?C) 98.2 ?F (36.8 ?C) 98.2 ?F (36.8 ?C)  ?TempSrc:  Oral Oral Oral  ?SpO2: 95% 96% 93% 99%  ?Weight:      ?Height:      ? ? ?Intake/Output from previous day: ? ? ?Intake/Output Summary (Last 24 hours) at 03/29/2022 1618 ?Last data filed at 03/29/2022 1100 ?Gross per 24 hour  ?Intake 725.97 ml  ?Output 2400 ml  ?Net -1674.03 ml  ? ? ?Physical Exam: ?General: Alert, awake, oriented x3, acute distress ?HEENT: [place]/AT PEERL, EOMI ?Neck: Trachea midline,  no masses, no thyromegal,y no JVD, no carotid bruit ?OROPHARYNX:  Moist, No exudate/ erythema/lesions.  ?Heart: Regular rate and rhythm, without murmurs, rubs, gallops, PMI non-displaced, no heaves or thrills on palpation.  ?Lungs: Clear to auscultation, no wheezing or rhonchi noted. No increased vocal fremitus resonant to percussion  ?Abdomen: Soft, nontender, nondistended, positive bowel sounds, no masses no hepatosplenomegaly noted.Marland Kitchen.  ?Neuro: No focal neurological deficits noted cranial nerves II through XII  grossly intact. DTRs 2+ bilaterally upper and lower extremities. Strength 5 out of 5 in bilateral upper and lower extremities. ?Musculoskeletal: No warm swelling or erythema around joints, no spinal tenderness noted. ?Psychiatric: Patient alert and oriented x3, good insight and cognition, good recent to remote recall. ?Lymph node survey: No cervical axillary or inguinal lymphadenopathy noted. ? ?Lab Results: ? ?Basic Metabolic Panel: ?   ?Component Value Date/Time  ? NA 136 03/29/2022 0547  ? NA 138 01/17/2022 1048  ? K 4.3 03/29/2022 0547  ? CL 102 03/29/2022 0547  ? CO2 24 03/29/2022 0547  ? BUN 11 03/29/2022 0547  ? BUN 10 01/17/2022 1048  ? CREATININE 0.74 03/29/2022 0547  ? GLUCOSE 115 (H) 03/29/2022 0547  ? CALCIUM 8.8 (L) 03/29/2022 0547  ? ?CBC: ?   ?Component Value Date/Time  ? WBC 16.4 (H) 03/29/2022 0547  ? HGB 9.2 (L) 03/29/2022 0547  ? HGB CANCELED 01/17/2022 1048  ? HCT 24.7 (L) 03/29/2022 0547  ? HCT CANCELED 01/17/2022 1048  ? PLT 419 (H) 03/29/2022 0547  ? PLT CANCELED 01/17/2022 1048  ? MCV 81.8 03/29/2022 0547  ? MCV CANCELED 01/17/2022 1048  ? NEUTROABS 9.1 (H) 03/28/2022 0908  ? NEUTROABS CANCELED 01/17/2022 1048  ? LYMPHSABS 4.3 (H) 03/28/2022 0908  ? LYMPHSABS CANCELED 01/17/2022 1048  ? MONOABS 1.4 (H) 03/28/2022 0908  ? EOSABS 0.2 03/28/2022 0908  ? EOSABS CANCELED 01/17/2022 1048  ? BASOSABS 0.2 (H) 03/28/2022 0908  ? BASOSABS CANCELED 01/17/2022 1048  ? ? ?Recent Results (from the  past 240 hour(s))  ?Resp Panel by RT-PCR (Flu A&B, Covid) Nasopharyngeal Swab     Status: None  ? Collection Time: 03/28/22 10:06 AM  ? Specimen: Nasopharyngeal Swab; Nasopharyngeal(NP) swabs in vial transport medium  ?Result Value Ref Range Status  ? SARS Coronavirus 2 by RT PCR NEGATIVE NEGATIVE Final  ?  Comment: (NOTE) ?SARS-CoV-2 target nucleic acids are NOT DETECTED. ? ?The SARS-CoV-2 RNA is generally detectable in upper respiratory ?specimens during the acute phase of infection. The lowest ?concentration  of SARS-CoV-2 viral copies this assay can detect is ?138 copies/mL. A negative result does not preclude SARS-Cov-2 ?infection and should not be used as the sole basis for treatment or ?other patient management decisions. A negative result may occur with  ?improper specimen collection/handling, submission of specimen other ?than nasopharyngeal swab, presence of viral mutation(s) within the ?areas targeted by this assay, and inadequate number of viral ?copies(<138 copies/mL). A negative result must be combined with ?clinical observations, patient history, and epidemiological ?information. The expected result is Negative. ? ?Fact Sheet for Patients:  ?BloggerCourse.com ? ?Fact Sheet for Healthcare Providers:  ?SeriousBroker.it ? ?This test is no t yet approved or cleared by the [place] FDA and  ?has been authorized for detection and/or diagnosis of SARS-CoV-2 by ?FDA under an Emergency Use Authorization (EUA). This EUA will remain  ?in effect (meaning this test can be used) for the duration of the ?COVID-19 declaration under Section 564(b)(1) of the Act, 21 ?U.S.C.section 360bbb-3(b)(1), unless the authorization is terminated  ?or revoked sooner.  ? ? ?  ? Influenza A by PCR NEGATIVE NEGATIVE Final  ? Influenza B by PCR NEGATIVE NEGATIVE Final  ?  Comment: (NOTE) ?The Xpert Xpress SARS-CoV-2/FLU/RSV plus assay is intended as an aid ?in the diagnosis of influenza from Nasopharyngeal swab specimens and ?should not be used as a sole basis for treatment. Nasal washings and ?aspirates are unacceptable for Xpert Xpress SARS-CoV-2/FLU/RSV ?testing. ? ?Fact Sheet for Patients: ?BloggerCourse.com ? ?Fact Sheet for Healthcare Providers: ?SeriousBroker.it ? ?This test is not yet approved or cleared by the [place] FDA and ?has been authorized for detection and/or diagnosis of SARS-CoV-2 by ?FDA under an Emergency Use  Authorization (EUA). This EUA will remain ?in effect (meaning this test can be used) for the duration of the ?COVID-19 declaration under Section 564(b)(1) of the Act, 21 U.S.C. ?section 360bbb-3(b)(1), unless the authorization is terminated or ?revoked. ? ?Performed at [facility], [address] ?[place], [place] [address] ?  ? ? ?Studies/Results: ?DG Chest 2 View ? ?Result Date: 03/28/2022 ?CLINICAL DATA:  Shortness of breath, hypoxia, pain, sickle cell disease EXAM: CHEST - 2 VIEW COMPARISON:  01/06/2022 FINDINGS: Transverse diameter of heart is increased. There are no signs of pulmonary edema or focal pulmonary consolidation. There is no pleural effusion or pneumothorax. IMPRESSION: Cardiomegaly. There are no signs of pulmonary edema or focal pulmonary consolidation. Electronically Signed   By: Ernie Avena M.D.   On: 03/28/2022 09:59  ? ?CT Angio Chest Pulmonary Embolism (PE) W or WO Contrast ? ?Result Date: 03/28/2022 ?CLINICAL DATA:  Elevated D-dimer concern for pulmonary embolus. EXAM: CT ANGIOGRAPHY CHEST WITH CONTRAST TECHNIQUE: Multidetector CT imaging of the chest was performed using the standard protocol during bolus administration of intravenous contrast. Multiplanar CT image reconstructions and MIPs were obtained to evaluate the vascular anatomy. RADIATION DOSE REDUCTION: This exam was performed according to the departmental dose-optimization program which includes automated exposure control, adjustment of the mA and/or kV according  to patient size and/or use of iterative reconstruction technique. CONTRAST:  59mL OMNIPAQUE IOHEXOL 350 MG/ML SOLN COMPARISON:  Chest CT January 06, 2022 FINDINGS: Cardiovascular: Satisfactory opacification of the pulmonary arteries to the distal lobar/proximal segmental level. No evidence of pulmonary embolism. Enlarged main pulmonary artery measures 3.3 cm. Normal heart size. No pericardial effusion. Mediastinum/Nodes: No discrete thyroid nodule.  No pathologically enlarged mediastinal, hilar or axillary lymph nodes. Fluid-filled esophagus. Lungs/Pleura: Mild bronchial wall thickening. Linear opacities in the bilateral lung bases, similar prior, likely

## 2022-03-30 LAB — BASIC METABOLIC PANEL
Anion gap: 10 (ref 5–15)
BUN: 8 mg/dL (ref 6–20)
CO2: 25 mmol/L (ref 22–32)
Calcium: 8.7 mg/dL — ABNORMAL LOW (ref 8.9–10.3)
Chloride: 101 mmol/L (ref 98–111)
Creatinine, Ser: 0.51 mg/dL — ABNORMAL LOW (ref 0.61–1.24)
GFR, Estimated: >60 mL/min (ref >60)
Glucose, Bld: 107 mg/dL — ABNORMAL HIGH (ref 70–99)
Potassium: 3.8 mmol/L (ref 3.5–5.1)
Sodium: 136 mmol/L (ref 135–145)

## 2022-03-30 LAB — CBC
HCT: 28 % — ABNORMAL LOW (ref 39.0–52.0)
Hemoglobin: 10.4 g/dL — ABNORMAL LOW (ref 13.0–17.0)
MCH: 30.3 pg (ref 26.0–34.0)
MCHC: 37.1 g/dL — ABNORMAL HIGH (ref 30.0–36.0)
MCV: 81.6 fL (ref 80.0–100.0)
Platelets: 382 10*3/uL (ref 150–400)
RBC: 3.43 MIL/uL — ABNORMAL LOW (ref 4.22–5.81)
RDW: 19.9 % — ABNORMAL HIGH (ref 11.5–15.5)
WBC: 14.2 10*3/uL — ABNORMAL HIGH (ref 4.0–10.5)
nRBC: 3.5 % — ABNORMAL HIGH (ref 0.0–0.2)

## 2022-03-30 LAB — LACTATE DEHYDROGENASE: LDH: 776 U/L — ABNORMAL HIGH (ref 98–192)

## 2022-03-30 MED ORDER — OXYCODONE HCL 5 MG PO TABS
10.0000 mg | ORAL_TABLET | ORAL | Status: DC
  Administered 2022-03-30 – 2022-04-01 (×8): 10 mg via ORAL
  Filled 2022-03-30 (×11): qty 2

## 2022-03-30 MED ORDER — HYDROMORPHONE 1 MG/ML IV SOLN
INTRAVENOUS | Status: DC
  Administered 2022-03-30: 1.5 mg via INTRAVENOUS
  Administered 2022-03-30: 3 mg via INTRAVENOUS
  Administered 2022-03-31 (×2): 2.5 mg via INTRAVENOUS
  Administered 2022-03-31: 0.5 mg via INTRAVENOUS
  Administered 2022-03-31 – 2022-04-01 (×3): 3 mg via INTRAVENOUS

## 2022-03-30 NOTE — Progress Notes (Signed)
Subjective: ?[name] Hill is a 21 year old male with a medical history significant for sickle cell disease, chronic pain syndrome, opiate dependence and tolerance, history of anemia of chronic disease, and mild intermittent asthma was admitted for acute chest syndrome in the setting of sickle cell crisis. ?Patient says that pain intensity has improved overnight. He rates pain at 7/10.  ?He denies headache, chest pain, urinary symptoms, nausea, vomiting, or diarrhea.  Patient endorses some shortness of breath. ? ?Objective: ? ?Vital signs in last 24 hours: ? ?Vitals:  ? 03/30/22 1336 03/30/22 1431 03/30/22 1614 03/30/22 1754  ?BP:  123/69  127/69  ?Pulse:  75  79  ?Resp: (!) 21 20  16   ?Temp:  98.4 ?F (36.9 ?C)    ?TempSrc:  Oral    ?SpO2: 97% 100% 99% 99%  ?Weight:      ?Height:      ? ? ?Intake/Output from previous day: ? ? ?Intake/Output Summary (Last 24 hours) at 03/30/2022 1808 ?Last data filed at 03/30/2022 1615 ?Gross per 24 hour  ?Intake 1108 ml  ?Output 3800 ml  ?Net -2692 ml  ? ? ?Physical Exam: ?General: Alert, awake, oriented x3, acute distress ?HEENT: [place]/AT PEERL, EOMI ?Neck: Trachea midline,  no masses, no thyromegal,y no JVD, no carotid bruit ?OROPHARYNX:  Moist, No exudate/ erythema/lesions.  ?Heart: Regular rate and rhythm, without murmurs, rubs, gallops, PMI non-displaced, no heaves or thrills on palpation.  ?Lungs: Clear to auscultation, no wheezing or rhonchi noted. No increased vocal fremitus resonant to percussion  ?Abdomen: Soft, nontender, nondistended, positive bowel sounds, no masses no hepatosplenomegaly noted.05/30/2022  ?Neuro: No focal neurological deficits noted cranial nerves II through XII grossly intact. DTRs 2+ bilaterally upper and lower extremities. Strength 5 out of 5 in bilateral upper and lower extremities. ?Musculoskeletal: No warm swelling or erythema around joints, no spinal tenderness noted. ?Psychiatric: Patient alert and oriented x3, good insight and cognition, good recent to remote  recall. ?Lymph node survey: No cervical axillary or inguinal lymphadenopathy noted. ? ?Lab Results: ? ?Basic Metabolic Panel: ?   ?Component Value Date/Time  ? NA 136 03/30/2022 0613  ? NA 138 01/17/2022 1048  ? K 3.8 03/30/2022 0613  ? CL 101 03/30/2022 0613  ? CO2 25 03/30/2022 0613  ? BUN 8 03/30/2022 0613  ? BUN 10 01/17/2022 1048  ? CREATININE 0.51 (L) 03/30/2022 05/30/2022  ? GLUCOSE 107 (H) 03/30/2022 05/30/2022  ? CALCIUM 8.7 (L) 03/30/2022 05/30/2022  ? ?CBC: ?   ?Component Value Date/Time  ? WBC 14.2 (H) 03/30/2022 05/30/2022  ? HGB 10.4 (L) 03/30/2022 05/30/2022  ? HGB CANCELED 01/17/2022 1048  ? HCT 28.0 (L) 03/30/2022 05/30/2022  ? HCT CANCELED 01/17/2022 1048  ? PLT 382 03/30/2022 0613  ? PLT CANCELED 01/17/2022 1048  ? MCV 81.6 03/30/2022 0613  ? MCV CANCELED 01/17/2022 1048  ? NEUTROABS 9.1 (H) 03/28/2022 0908  ? NEUTROABS CANCELED 01/17/2022 1048  ? LYMPHSABS 4.3 (H) 03/28/2022 0908  ? LYMPHSABS CANCELED 01/17/2022 1048  ? MONOABS 1.4 (H) 03/28/2022 0908  ? EOSABS 0.2 03/28/2022 0908  ? EOSABS CANCELED 01/17/2022 1048  ? BASOSABS 0.2 (H) 03/28/2022 0908  ? BASOSABS CANCELED 01/17/2022 1048  ? ? ?Recent Results (from the past 240 hour(s))  ?Resp Panel by RT-PCR (Flu A&B, Covid) Nasopharyngeal Swab     Status: None  ? Collection Time: 03/28/22 10:06 AM  ? Specimen: Nasopharyngeal Swab; Nasopharyngeal(NP) swabs in vial transport medium  ?Result Value Ref Range Status  ? SARS Coronavirus 2  by RT PCR NEGATIVE NEGATIVE Final  ?  Comment: (NOTE) ?SARS-CoV-2 target nucleic acids are NOT DETECTED. ? ?The SARS-CoV-2 RNA is generally detectable in upper respiratory ?specimens during the acute phase of infection. The lowest ?concentration of SARS-CoV-2 viral copies this assay can detect is ?138 copies/mL. A negative result does not preclude SARS-Cov-2 ?infection and should not be used as the sole basis for treatment or ?other patient management decisions. A negative result may occur with  ?improper specimen collection/handling, submission of  specimen other ?than nasopharyngeal swab, presence of viral mutation(s) within the ?areas targeted by this assay, and inadequate number of viral ?copies(<138 copies/mL). A negative result must be combined with ?clinical observations, patient history, and epidemiological ?information. The expected result is Negative. ? ?Fact Sheet for Patients:  ?BloggerCourse.comhttps://www.fda.gov/media/152166/download ? ?Fact Sheet for Healthcare Providers:  ?SeriousBroker.ithttps://www.fda.gov/media/152162/download ? ?This test is no t yet approved or cleared by the [place] FDA and  ?has been authorized for detection and/or diagnosis of SARS-CoV-2 by ?FDA under an Emergency Use Authorization (EUA). This EUA will remain  ?in effect (meaning this test can be used) for the duration of the ?COVID-19 declaration under Section 564(b)(1) of the Act, 21 ?U.S.C.section 360bbb-3(b)(1), unless the authorization is terminated  ?or revoked sooner.  ? ? ?  ? Influenza A by PCR NEGATIVE NEGATIVE Final  ? Influenza B by PCR NEGATIVE NEGATIVE Final  ?  Comment: (NOTE) ?The Xpert Xpress SARS-CoV-2/FLU/RSV plus assay is intended as an aid ?in the diagnosis of influenza from Nasopharyngeal swab specimens and ?should not be used as a sole basis for treatment. Nasal washings and ?aspirates are unacceptable for Xpert Xpress SARS-CoV-2/FLU/RSV ?testing. ? ?Fact Sheet for Patients: ?BloggerCourse.comhttps://www.fda.gov/media/152166/download ? ?Fact Sheet for Healthcare Providers: ?SeriousBroker.ithttps://www.fda.gov/media/152162/download ? ?This test is not yet approved or cleared by the [place] FDA and ?has been authorized for detection and/or diagnosis of SARS-CoV-2 by ?FDA under an Emergency Use Authorization (EUA). This EUA will remain ?in effect (meaning this test can be used) for the duration of the ?COVID-19 declaration under Section 564(b)(1) of the Act, 21 U.S.C. ?section 360bbb-3(b)(1), unless the authorization is terminated or ?revoked. ? ?Performed at [facility], [address] ?[place], [place] [address] ?  ? ? ?Studies/Results: ?CT Angio Chest Pulmonary Embolism (PE) W or WO Contrast ? ?Result Date: 03/28/2022 ?CLINICAL DATA:  Elevated D-dimer concern for pulmonary embolus. EXAM: CT ANGIOGRAPHY CHEST WITH CONTRAST TECHNIQUE: Multidetector CT imaging of the chest was performed using the standard protocol during bolus administration of intravenous contrast. Multiplanar CT image reconstructions and MIPs were obtained to evaluate the vascular anatomy. RADIATION DOSE REDUCTION: This exam was performed according to the departmental dose-optimization program which includes automated exposure control, adjustment of the mA and/or kV according to patient size and/or use of iterative reconstruction technique. CONTRAST:  75mL OMNIPAQUE IOHEXOL 350 MG/ML SOLN COMPARISON:  Chest CT January 06, 2022 FINDINGS: Cardiovascular: Satisfactory opacification of the pulmonary arteries to the distal lobar/proximal segmental level. No evidence of pulmonary embolism. Enlarged main pulmonary artery measures 3.3 cm. Normal heart size. No pericardial effusion. Mediastinum/Nodes: No discrete thyroid nodule. No pathologically enlarged mediastinal, hilar or axillary lymph nodes. Fluid-filled esophagus. Lungs/Pleura: Mild bronchial wall thickening. Linear opacities in the bilateral lung bases, similar prior, likely reflect a combination of scarring and atelectasis. Focal area of opacification in the posterior right lower lobe on image 84/6 is new from prior and possibly reflects acute chest syndrome in the setting of sickle cell disease. No pleural effusion or pneumothorax.  Upper Abdomen: No acute abnormality. Small heterogeneous spleen is similar prior reflecting auto infarction. Musculoskeletal: Similar osseous sequela of sickle cell disease. Review of the MIP images confirms the above findings. IMPRESSION: 1. No evidence of acute pulmonary embolism. 2. Chronic parenchymal lung changes of sickle cell disease  with a new banded opacity in the right lower lobe which may reflect acute chest. 3. Enlarged main pulmonary artery measures 3.3 cm, which can be seen in the setting of pulmonary arterial hypertension. 4. Fluid-

## 2022-03-31 DIAGNOSIS — D72829 Elevated white blood cell count, unspecified: Secondary | ICD-10-CM

## 2022-03-31 DIAGNOSIS — D638 Anemia in other chronic diseases classified elsewhere: Secondary | ICD-10-CM

## 2022-03-31 DIAGNOSIS — I10 Essential (primary) hypertension: Secondary | ICD-10-CM

## 2022-03-31 DIAGNOSIS — G894 Chronic pain syndrome: Secondary | ICD-10-CM

## 2022-03-31 DIAGNOSIS — J9601 Acute respiratory failure with hypoxia: Secondary | ICD-10-CM

## 2022-03-31 DIAGNOSIS — R0902 Hypoxemia: Secondary | ICD-10-CM

## 2022-03-31 DIAGNOSIS — J4520 Mild intermittent asthma, uncomplicated: Secondary | ICD-10-CM

## 2022-03-31 LAB — CBC
HCT: 28.2 % — ABNORMAL LOW (ref 39.0–52.0)
Hemoglobin: 10.4 g/dL — ABNORMAL LOW (ref 13.0–17.0)
MCH: 30.9 pg (ref 26.0–34.0)
MCHC: 36.9 g/dL — ABNORMAL HIGH (ref 30.0–36.0)
MCV: 83.7 fL (ref 80.0–100.0)
Platelets: 359 10*3/uL (ref 150–400)
RBC: 3.37 MIL/uL — ABNORMAL LOW (ref 4.22–5.81)
RDW: 20.5 % — ABNORMAL HIGH (ref 11.5–15.5)
WBC: 11.7 10*3/uL — ABNORMAL HIGH (ref 4.0–10.5)
nRBC: 2.3 % — ABNORMAL HIGH (ref 0.0–0.2)

## 2022-03-31 LAB — LACTATE DEHYDROGENASE: LDH: 607 U/L — ABNORMAL HIGH (ref 98–192)

## 2022-03-31 LAB — RETICULOCYTES
Immature Retic Fract: 26.2 % — ABNORMAL HIGH (ref 2.3–15.9)
RBC.: 3.37 MIL/uL — ABNORMAL LOW (ref 4.22–5.81)
Retic Count, Absolute: 229 10*3/uL — ABNORMAL HIGH (ref 19.0–186.0)
Retic Ct Pct: 6.8 % — ABNORMAL HIGH (ref 0.4–3.1)
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
## 2022-03-31 NOTE — Progress Notes (Signed)
Patient ID: [name] Hill, male   DOB: [birth year], 21 y.o.   MRN: [id number] ?Subjective: ?[name] Hill is a 21 year old male with a medical history significant for sickle cell disease, chronic pain syndrome, opiate dependence and tolerance, history of anemia of chronic disease, and mild intermittent asthma was admitted for acute chest syndrome in the setting of sickle cell crisis. ? ?Patient is status post simple exchange transfusion for acute chest syndrome.  He is still requiring up to 3 L of oxygen and noted to be desaturating to as low as 87% on room air while ambulating.  He has no new complaint today but still continues to have significant pain.  Patient is on home oxygen at 2 L/min and said he has enough supply at home.  He denies any fever, cough, chest pain, shortness of breath, nausea, vomiting or diarrhea. ? ?Objective: ? ?Vital signs in last 24 hours: ? ?Vitals:  ? 03/31/22 0955 03/31/22 1145 03/31/22 1346 03/31/22 1601  ?BP: 130/80  135/76   ?Pulse: 85  83   ?Resp: 16 20 14 19   ?Temp: 98.4 ?F (36.9 ?C)  98 ?F (36.7 ?C)   ?TempSrc: Oral  Oral   ?SpO2: 91% 93% 92% 94%  ?Weight:      ?Height:      ? ? ?Intake/Output from previous day: ? ? ?Intake/Output Summary (Last 24 hours) at 03/31/2022 1640 ?Last data filed at 03/31/2022 1347 ?Gross per 24 hour  ?Intake 1284.5 ml  ?Output 700 ml  ?Net 584.5 ml  ? ? ?Physical Exam: ?General: Alert, awake, oriented x3, in no acute distress.  ?HEENT: [place]/AT PEERL, EOMI ?Neck: Trachea midline,  no masses, no thyromegal,y no JVD, no carotid bruit ?OROPHARYNX:  Moist, No exudate/ erythema/lesions.  ?Heart: Regular rate and rhythm, without murmurs, rubs, gallops, PMI non-displaced, no heaves or thrills on palpation.  ?Lungs: Clear to auscultation, no wheezing or rhonchi noted. No increased vocal fremitus resonant to percussion  ?Abdomen: Soft, nontender, nondistended, positive bowel sounds, no masses no hepatosplenomegaly noted.Marland Kitchen  ?Neuro: No focal neurological deficits noted  cranial nerves II through XII grossly intact. DTRs 2+ bilaterally upper and lower extremities. Strength 5 out of 5 in bilateral upper and lower extremities. ?Musculoskeletal: No warm swelling or erythema around joints, no spinal tenderness noted. ?Psychiatric: Patient alert and oriented x3, good insight and cognition, good recent to remote recall. ?Lymph node survey: No cervical axillary or inguinal lymphadenopathy noted. ? ?Lab Results: ? ?Basic Metabolic Panel: ?   ?Component Value Date/Time  ? NA 136 03/30/2022 0613  ? NA 138 01/17/2022 1048  ? K 3.8 03/30/2022 0613  ? CL 101 03/30/2022 0613  ? CO2 25 03/30/2022 0613  ? BUN 8 03/30/2022 0613  ? BUN 10 01/17/2022 1048  ? CREATININE 0.51 (L) 03/30/2022 [id number]  ? GLUCOSE 107 (H) 03/30/2022 [id number]  ? CALCIUM 8.7 (L) 03/30/2022 [id number]  ? ?CBC: ?   ?Component Value Date/Time  ? WBC 11.7 (H) 03/31/2022 0600  ? HGB 10.4 (L) 03/31/2022 0600  ? HGB CANCELED 01/17/2022 1048  ? HCT 28.2 (L) 03/31/2022 0600  ? HCT CANCELED 01/17/2022 1048  ? PLT 359 03/31/2022 0600  ? PLT CANCELED 01/17/2022 1048  ? MCV 83.7 03/31/2022 0600  ? MCV CANCELED 01/17/2022 1048  ? NEUTROABS 9.1 (H) 03/28/2022 0908  ? NEUTROABS CANCELED 01/17/2022 1048  ? LYMPHSABS 4.3 (H) 03/28/2022 0908  ? LYMPHSABS CANCELED 01/17/2022 1048  ? MONOABS 1.4 (H) 03/28/2022 0908  ? EOSABS 0.2 03/28/2022 0908  ?  EOSABS CANCELED 01/17/2022 1048  ? BASOSABS 0.2 (H) 03/28/2022 0908  ? BASOSABS CANCELED 01/17/2022 1048  ? ? ?Recent Results (from the past 240 hour(s))  ?Resp Panel by RT-PCR (Flu A&B, Covid) Nasopharyngeal Swab     Status: None  ? Collection Time: 03/28/22 10:06 AM  ? Specimen: Nasopharyngeal Swab; Nasopharyngeal(NP) swabs in vial transport medium  ?Result Value Ref Range Status  ? SARS Coronavirus 2 by RT PCR NEGATIVE NEGATIVE Final  ?  Comment: (NOTE) ?SARS-CoV-2 target nucleic acids are NOT DETECTED. ? ?The SARS-CoV-2 RNA is generally detectable in upper respiratory ?specimens during the acute phase of  infection. The lowest ?concentration of SARS-CoV-2 viral copies this assay can detect is ?138 copies/mL. A negative result does not preclude SARS-Cov-2 ?infection and should not be used as the sole basis for treatment or ?other patient management decisions. A negative result may occur with  ?improper specimen collection/handling, submission of specimen other ?than nasopharyngeal swab, presence of viral mutation(s) within the ?areas targeted by this assay, and inadequate number of viral ?copies(<138 copies/mL). A negative result must be combined with ?clinical observations, patient history, and epidemiological ?information. The expected result is Negative. ? ?Fact Sheet for Patients:  ?EntrepreneurPulse.com.au ? ?Fact Sheet for Healthcare Providers:  ?IncredibleEmployment.be ? ?This test is no t yet approved or cleared by the [place] FDA and  ?has been authorized for detection and/or diagnosis of SARS-CoV-2 by ?FDA under an Emergency Use Authorization (EUA). This EUA will remain  ?in effect (meaning this test can be used) for the duration of the ?COVID-19 declaration under Section 564(b)(1) of the Act, 21 ?U.S.C.section 360bbb-3(b)(1), unless the authorization is terminated  ?or revoked sooner.  ? ? ?  ? Influenza A by PCR NEGATIVE NEGATIVE Final  ? Influenza B by PCR NEGATIVE NEGATIVE Final  ?  Comment: (NOTE) ?The Xpert Xpress SARS-CoV-2/FLU/RSV plus assay is intended as an aid ?in the diagnosis of influenza from Nasopharyngeal swab specimens and ?should not be used as a sole basis for treatment. Nasal washings and ?aspirates are unacceptable for Xpert Xpress SARS-CoV-2/FLU/RSV ?testing. ? ?Fact Sheet for Patients: ?EntrepreneurPulse.com.au ? ?Fact Sheet for Healthcare Providers: ?IncredibleEmployment.be ? ?This test is not yet approved or cleared by the [place] FDA and ?has been authorized for detection and/or diagnosis of SARS-CoV-2  by ?FDA under an Emergency Use Authorization (EUA). This EUA will remain ?in effect (meaning this test can be used) for the duration of the ?COVID-19 declaration under Section 564(b)(1) of the Act, 21 U.S.C. ?section 360bbb-3(b)(1), unless the authorization is terminated or ?revoked. ? ?Performed at [name], [address] ?[place], [place] [address] ?  ? ? ?Studies/Results: ?No results found. ? ?Medications: ?Scheduled Meds: ? sodium chloride   Intravenous Once  ? enoxaparin (LOVENOX) injection  40 mg Subcutaneous [id number]  ? folic acid  1 mg Oral Daily  ? HYDROmorphone   Intravenous Q4H  ? ketorolac  15 mg Intravenous Q6H  ? oxyCODONE  10 mg Oral Q4H while awake  ? senna-docusate  1 tablet Oral BID  ? ?Continuous Infusions: ?PRN Meds:.albuterol, diphenhydrAMINE, naloxone **AND** sodium chloride flush, ondansetron, polyethylene glycol ? ?Consultants: ?None ? ?Procedures: ?None ? ?Antibiotics: ?None ? ?Assessment/Plan: ?Principal Problem: ?  Sickle cell pain crisis ([place]) ?Active Problems: ?  Anemia of chronic disease ?  Asthma ?  Leukocytosis ?  Hypertension ?  Chronic pain syndrome ?  Acute respiratory failure with hypoxia ([place]) ? ?Acute chest syndrome and acute respiratory failure with hypoxia: Patient continues  to require 3 to 4 L/min of supplemental oxygen. We will continue supplemental oxygen today.  Patient is scheduled to be discharged home tomorrow, to continue home oxygen.  We will continue to wean off oxygen to baseline before discharge. ?Hb Sickle Cell Disease with Pain crisis: Continue [facility] at [facility], continue weight based Dilaudid PCA at current setting, continue IV Toradol 15 mg Q 6 H for total of 5 days, continue oral home medications as ordered.  Monitor vitals very closely, Re-evaluate pain scale regularly, 2 L of Oxygen by [place]. ?Leukocytosis: Improving.  There is no sign or symptoms of infection or inflammation. We will continue to monitor closely. Repeat CBC in a.m. ?Anemia of  Chronic Disease: Hemoglobin appears to be stable at baseline now.  Patient is status post simple exchange transfusion.  There is no clinical indication for additional transfusion at this time.  We will continue to

## 2022-03-31 NOTE — Progress Notes (Signed)
SATURATION QUALIFICATIONS: (This note is used to comply with regulatory documentation for home oxygen) ? ?Patient Saturations on Room Air at Rest = 87% ?(Patient had O2 sat of 92% on 1L) ? ?Patient Saturations on Room Air while Ambulating = 87% ? ?Patient Saturations on 2 Liters of oxygen while Ambulating = 94% ? ?Patient may need 02 at home due to inability to maintain oxygen saturation above 90 percent during crisis.  ?

## 2022-04-01 LAB — CBC WITH DIFFERENTIAL/PLATELET
Abs Immature Granulocytes: 0.06 10*3/uL (ref 0.00–0.07)
Basophils Absolute: 0 10*3/uL (ref 0.0–0.1)
Basophils Relative: 0 %
Eosinophils Absolute: 0.1 10*3/uL (ref 0.0–0.5)
Eosinophils Relative: 1 %
HCT: 27.4 % — ABNORMAL LOW (ref 39.0–52.0)
Hemoglobin: 9.7 g/dL — ABNORMAL LOW (ref 13.0–17.0)
Immature Granulocytes: 1 %
Lymphocytes Relative: 21 %
Lymphs Abs: 2.2 10*3/uL (ref 0.7–4.0)
MCH: 29.8 pg (ref 26.0–34.0)
MCHC: 35.4 g/dL (ref 30.0–36.0)
MCV: 84 fL (ref 80.0–100.0)
Monocytes Absolute: 1.2 10*3/uL — ABNORMAL HIGH (ref 0.1–1.0)
Monocytes Relative: 11 %
Neutro Abs: 7 10*3/uL (ref 1.7–7.7)
Neutrophils Relative %: 66 %
Platelets: 382 10*3/uL (ref 150–400)
RBC: 3.26 MIL/uL — ABNORMAL LOW (ref 4.22–5.81)
RDW: 19.3 % — ABNORMAL HIGH (ref 11.5–15.5)
WBC: 10.5 10*3/uL (ref 4.0–10.5)
nRBC: 1 % — ABNORMAL HIGH (ref 0.0–0.2)

## 2022-04-01 LAB — TYPE AND SCREEN
ABO/RH(D): O POS
Antibody Screen: NEGATIVE
Unit division: 0
Unit division: 0

## 2022-04-01 LAB — BPAM RBC
Blood Product Expiration Date: 202306122359
Blood Product Expiration Date: 202306142359
ISSUE DATE / TIME: 202305052042
ISSUE DATE / TIME: 202305060049
Unit Type and Rh: 5100
Unit Type and Rh: 5100

## 2022-04-01 LAB — BASIC METABOLIC PANEL
Anion gap: 8 (ref 5–15)
BUN: 13 mg/dL (ref 6–20)
CO2: 28 mmol/L (ref 22–32)
Calcium: 8.8 mg/dL — ABNORMAL LOW (ref 8.9–10.3)
Chloride: 100 mmol/L (ref 98–111)
Creatinine, Ser: 0.34 mg/dL — ABNORMAL LOW (ref 0.61–1.24)
GFR, Estimated: >60 mL/min (ref >60)
Glucose, Bld: 114 mg/dL — ABNORMAL HIGH (ref 70–99)
Potassium: 4.4 mmol/L (ref 3.5–5.1)
Sodium: 136 mmol/L (ref 135–145)

## 2022-04-01 NOTE — Discharge Summary (Signed)
Physician Discharge Summary  ?GRAFTON WARZECHA [id number] DOB: Apr 10, 2001 DOA: 03/28/2022 ? ?PCP: Massie Maroon, FNP ? ?Admit date: 03/28/2022 ? ?Discharge date: 04/01/2022 ? ?Admitted From:Home ? ?Disposition:  Home ? ?Recommendations for Outpatient Follow-up:  ?Follow up with PCP in 1-2 weeks at sickle cell clinic ?Continue home medications as prior ? ?Home Health: None ? ?Equipment/Devices: None ? ?Discharge Condition:Stable ? ?CODE STATUS: Full ? ?Diet recommendation: Heart Healthy ? ?Brief/Interim Summary: ?[name] Hill is a 21 year old male with a medical history significant for sickle cell disease, chronic pain syndrome, opiate dependence and tolerance, history of anemia of chronic disease, and mild intermittent asthma was admitted for acute chest syndrome in the setting of sickle cell crisis. ? ?-He required simple exchange transfusion for his acute chest syndrome and was requiring up to 3 L nasal cannula oxygen, but usually has 2 L at baseline.  He denies any further pain complaints and has stable hemoglobin levels at 9.7 this morning.  No further fevers or other acute abnormalities noted and leukocytosis has resolved.  He will continue his pain management at home as otherwise prescribed and does not require any new prescriptions. ? ?Discharge Diagnoses:  ?Principal Problem: ?  Sickle cell pain crisis (HCC) ?Active Problems: ?  Anemia of chronic disease ?  Asthma ?  Hypoxia ?  Leukocytosis ?  Hypertension ?  Chronic pain syndrome ?  Acute respiratory failure with hypoxia (HCC) ? ?Principal discharge diagnosis: Acute chest syndrome with associated acute on chronic hypoxemic respiratory failure.  Sickle cell pain crisis. ? ?Discharge Instructions ? ?Discharge Instructions   ? ? Diet - low sodium heart healthy   Complete by: As directed ?  ? Increase activity slowly   Complete by: As directed ?  ? ?  ? ?Allergies as of 04/01/2022   ? ?   Reactions  ? Lisinopril Cough  ? Toradol [ketorolac Tromethamine] Itching   ? ?  ? ?  ?Medication List  ?  ? ?STOP taking these medications   ? ?ergocalciferol 1.25 MG (50000 UT) capsule ?Commonly known as: Drisdol ?  ?losartan 50 MG tablet ?Commonly known as: COZAAR ?  ? ?  ? ?TAKE these medications   ? ?acetaminophen 500 MG tablet ?Commonly known as: TYLENOL ?Take 1,000 mg by mouth daily as needed for mild pain. ?  ?ibuprofen 800 MG tablet ?Commonly known as: ADVIL ?Take 1 tablet (800 mg total) by mouth every 8 (eight) hours as needed. ?  ?Oxycodone HCl 10 MG Tabs ?Take 1 tablet (10 mg total) by mouth every 4 (four) hours as needed (pain). ?  ? ?  ? ? Follow-up Information   ? ? Massie Maroon, FNP. Schedule an appointment as soon as possible for a visit in 1 week(s).   ?Specialty: Family Medicine ?Contact information: ?[address] ?[address] ?[place] [address] ?[phone] ? ? ?  ?  ? ?  ?  ? ?  ? ?Allergies  ?Allergen Reactions  ? Lisinopril Cough  ? Toradol [Ketorolac Tromethamine] Itching  ? ? ?Consultations: ?None ? ? ?Procedures/Studies: ?DG Chest 2 View ? ?Result Date: 03/28/2022 ?CLINICAL DATA:  Shortness of breath, hypoxia, pain, sickle cell disease EXAM: CHEST - 2 VIEW COMPARISON:  01/06/2022 FINDINGS: Transverse diameter of heart is increased. There are no signs of pulmonary edema or focal pulmonary consolidation. There is no pleural effusion or pneumothorax. IMPRESSION: Cardiomegaly. There are no signs of pulmonary edema or focal pulmonary consolidation. Electronically Signed   By: Rhae Hammock  Rathinasamy M.D.   On: 03/28/2022 09:59  ? ?CT Angio Chest Pulmonary Embolism (PE) W or WO Contrast ? ?Result Date: 03/28/2022 ?CLINICAL DATA:  Elevated D-dimer concern for pulmonary embolus. EXAM: CT ANGIOGRAPHY CHEST WITH CONTRAST TECHNIQUE: Multidetector CT imaging of the chest was performed using the standard protocol during bolus administration of intravenous contrast. Multiplanar CT image reconstructions and MIPs were obtained to evaluate the vascular anatomy. RADIATION DOSE  REDUCTION: This exam was performed according to the departmental dose-optimization program which includes automated exposure control, adjustment of the mA and/or kV according to patient size and/or use of iterative reconstruction technique. CONTRAST:  75mL OMNIPAQUE IOHEXOL 350 MG/ML SOLN COMPARISON:  Chest CT January 06, 2022 FINDINGS: Cardiovascular: Satisfactory opacification of the pulmonary arteries to the distal lobar/proximal segmental level. No evidence of pulmonary embolism. Enlarged main pulmonary artery measures 3.3 cm. Normal heart size. No pericardial effusion. Mediastinum/Nodes: No discrete thyroid nodule. No pathologically enlarged mediastinal, hilar or axillary lymph nodes. Fluid-filled esophagus. Lungs/Pleura: Mild bronchial wall thickening. Linear opacities in the bilateral lung bases, similar prior, likely reflect a combination of scarring and atelectasis. Focal area of opacification in the posterior right lower lobe on image 84/6 is new from prior and possibly reflects acute chest syndrome in the setting of sickle cell disease. No pleural effusion or pneumothorax. Upper Abdomen: No acute abnormality. Small heterogeneous spleen is similar prior reflecting auto infarction. Musculoskeletal: Similar osseous sequela of sickle cell disease. Review of the MIP images confirms the above findings. IMPRESSION: 1. No evidence of acute pulmonary embolism. 2. Chronic parenchymal lung changes of sickle cell disease with a new banded opacity in the right lower lobe which may reflect acute chest. 3. Enlarged main pulmonary artery measures 3.3 cm, which can be seen in the setting of pulmonary arterial hypertension. 4. Fluid-filled esophagus, suggesting dysmotility and/or reflux. 5. Similar osseous and splenic sequela of sickle cell disease. Electronically Signed   By: Maudry MayhewJeffrey  Waltz M.D.   On: 03/28/2022 18:28   ? ? ?Discharge Exam: ?Vitals:  ? 04/01/22 0630 04/01/22 0742  ?BP: (!) 120/57   ?Pulse: 81   ?Resp: 18  16  ?Temp: 99.2 ?F (37.3 ?C)   ?SpO2: 96% 97%  ? ?Vitals:  ? 04/01/22 0135 04/01/22 0538 04/01/22 0630 04/01/22 0742  ?BP: 131/63  (!) 120/57   ?Pulse: 89  81   ?Resp: 18 20 18 16   ?Temp: 98.4 ?F (36.9 ?C)  99.2 ?F (37.3 ?C)   ?TempSrc: Oral  Oral   ?SpO2: 93% 92% 96% 97%  ?Weight:      ?Height:      ? ? ?General: Pt is alert, awake, not in acute distress ?Cardiovascular: RRR, S1/S2 +, no rubs, no gallops ?Respiratory: CTA bilaterally, no wheezing, no rhonchi, on 2 L nasal cannula ?Abdominal: Soft, NT, ND, bowel sounds + ?Extremities: no edema, no cyanosis ? ? ? ?The results of significant diagnostics from this hospitalization (including imaging, microbiology, ancillary and laboratory) are listed below for reference.   ? ? ?Microbiology: ?Recent Results (from the past 240 hour(s))  ?Resp Panel by RT-PCR (Flu A&B, Covid) Nasopharyngeal Swab     Status: None  ? Collection Time: 03/28/22 10:06 AM  ? Specimen: Nasopharyngeal Swab; Nasopharyngeal(NP) swabs in vial transport medium  ?Result Value Ref Range Status  ? SARS Coronavirus 2 by RT PCR NEGATIVE NEGATIVE Final  ?  Comment: (NOTE) ?SARS-CoV-2 target nucleic acids are NOT DETECTED. ? ?The SARS-CoV-2 RNA is generally detectable in upper respiratory ?specimens during the acute phase  of infection. The lowest ?concentration of SARS-CoV-2 viral copies this assay can detect is ?138 copies/mL. A negative result does not preclude SARS-Cov-2 ?infection and should not be used as the sole basis for treatment or ?other patient management decisions. A negative result may occur with  ?improper specimen collection/handling, submission of specimen other ?than nasopharyngeal swab, presence of viral mutation(s) within the ?areas targeted by this assay, and inadequate number of viral ?copies(<138 copies/mL). A negative result must be combined with ?clinical observations, patient history, and epidemiological ?information. The expected result is Negative. ? ?Fact Sheet for Patients:   ?BloggerCourse.com ? ?Fact Sheet for Healthcare Providers:  ?SeriousBroker.it ? ?This test is no t yet approved or cleared by the [place] FDA and  ?has

## 2022-04-10 ENCOUNTER — Other Ambulatory Visit: Payer: Self-pay | Admitting: Family Medicine

## 2022-04-10 ENCOUNTER — Telehealth: Payer: Self-pay

## 2022-04-10 DIAGNOSIS — F1190 Opioid use, unspecified, uncomplicated: Secondary | ICD-10-CM

## 2022-04-10 DIAGNOSIS — G894 Chronic pain syndrome: Secondary | ICD-10-CM

## 2022-04-10 DIAGNOSIS — D571 Sickle-cell disease without crisis: Secondary | ICD-10-CM

## 2022-04-10 MED ORDER — FOLIC ACID 1 MG PO TABS
1.0000 mg | ORAL_TABLET | Freq: Every day | ORAL | 3 refills | Status: AC
Start: 2022-04-10 — End: 2023-04-10

## 2022-04-10 MED ORDER — OXYCODONE HCL 10 MG PO TABS: 10.0000 mg | ORAL_TABLET | ORAL | 0 refills | Status: DC | PRN

## 2022-04-10 NOTE — Telephone Encounter (Signed)
Oxycodone  °

## 2022-04-10 NOTE — Progress Notes (Signed)
Reviewed PDMP substance reporting system prior to prescribing opiate medications. No inconsistencies noted.  ?Meds ordered this encounter  ?Medications  ? Oxycodone HCl 10 MG TABS  ?  Sig: Take 1 tablet (10 mg total) by mouth every 4 (four) hours as needed (pain).  ?  Dispense:  60 tablet  ?  Refill:  0  ?  Order Specific Question:   Supervising Provider  ?  AnswerQuentin Angst [[id number]]  ? folic acid (FOLVITE) 1 MG tablet  ?  Sig: Take 1 tablet (1 mg total) by mouth daily.  ?  Dispense:  30 tablet  ?  Refill:  3  ?  Order Specific Question:   Supervising Provider  ?  AnswerQuentin Angst [[id number]]  ? Nolon Nations  APRN, MSN, FNP-C ?Patient [facility] Center ?[facility] ?[address]  ?[place], [place] [address] ?[phone] ? ?

## 2022-04-16 ENCOUNTER — Ambulatory Visit (INDEPENDENT_AMBULATORY_CARE_PROVIDER_SITE_OTHER): Payer: Medicaid Other | Admitting: Family Medicine

## 2022-04-16 ENCOUNTER — Encounter: Payer: Self-pay | Admitting: Family Medicine

## 2022-04-16 VITALS — BP 135/75 | HR 77 | Temp 98.5°F | Ht 67.0 in | Wt 197.2 lb

## 2022-04-16 DIAGNOSIS — F1190 Opioid use, unspecified, uncomplicated: Secondary | ICD-10-CM | POA: Diagnosis not present

## 2022-04-16 DIAGNOSIS — D571 Sickle-cell disease without crisis: Secondary | ICD-10-CM | POA: Diagnosis not present

## 2022-04-16 DIAGNOSIS — Z113 Encounter for screening for infections with a predominantly sexual mode of transmission: Secondary | ICD-10-CM

## 2022-04-16 DIAGNOSIS — G894 Chronic pain syndrome: Secondary | ICD-10-CM

## 2022-04-16 DIAGNOSIS — R300 Dysuria: Secondary | ICD-10-CM | POA: Diagnosis not present

## 2022-04-16 LAB — POCT URINALYSIS DIP (CLINITEK)
Bilirubin, UA: NEGATIVE
Glucose, UA: NEGATIVE mg/dL
Ketones, POC UA: NEGATIVE mg/dL
Leukocytes, UA: NEGATIVE
Nitrite, UA: NEGATIVE
POC PROTEIN,UA: 100 — AB
Spec Grav, UA: 1.02 (ref 1.010–1.025)
Urobilinogen, UA: 1 E.U./dL
pH, UA: 7 (ref 5.0–8.0)

## 2022-04-17 LAB — CMP14+CBC/D/PLT+FER+RETIC+V...
ALT: 19 IU/L (ref 0–44)
AST: 39 IU/L (ref 0–40)
Albumin/Globulin Ratio: 1.6 (ref 1.2–2.2)
Albumin: 4.5 g/dL (ref 4.1–5.2)
Alkaline Phosphatase: 113 IU/L (ref 44–121)
BUN/Creatinine Ratio: 9 (ref 9–20)
BUN: 6 mg/dL (ref 6–20)
Basophils Absolute: 0.1 10*3/uL (ref 0.0–0.2)
Basos: 1 %
Bilirubin Total: 3.3 mg/dL — ABNORMAL HIGH (ref 0.0–1.2)
CO2: 24 mmol/L (ref 20–29)
Calcium: 10 mg/dL (ref 8.7–10.2)
Chloride: 98 mmol/L (ref 96–106)
Creatinine, Ser: 0.7 mg/dL — ABNORMAL LOW (ref 0.76–1.27)
EOS (ABSOLUTE): 0.2 10*3/uL (ref 0.0–0.4)
Eos: 1 %
Ferritin: 884 ng/mL — ABNORMAL HIGH (ref 30–400)
Globulin, Total: 2.9 g/dL (ref 1.5–4.5)
Glucose: 69 mg/dL — ABNORMAL LOW (ref 70–99)
Hematocrit: 33.4 % — ABNORMAL LOW (ref 37.5–51.0)
Hemoglobin: 11.1 g/dL — ABNORMAL LOW (ref 13.0–17.7)
Immature Grans (Abs): 0 10*3/uL (ref 0.0–0.1)
Immature Granulocytes: 0 %
Lymphocytes Absolute: 2.8 10*3/uL (ref 0.7–3.1)
Lymphs: 23 %
MCH: 28.5 pg (ref 26.6–33.0)
MCHC: 33.2 g/dL (ref 31.5–35.7)
MCV: 86 fL (ref 79–97)
Monocytes Absolute: 1.3 10*3/uL — ABNORMAL HIGH (ref 0.1–0.9)
Monocytes: 10 %
Neutrophils Absolute: 7.7 10*3/uL — ABNORMAL HIGH (ref 1.4–7.0)
Neutrophils: 65 %
Platelets: 760 10*3/uL — ABNORMAL HIGH (ref 150–450)
Potassium: 5.1 mmol/L (ref 3.5–5.2)
RBC: 3.89 x10E6/uL — ABNORMAL LOW (ref 4.14–5.80)
RDW: 16.6 % — ABNORMAL HIGH (ref 11.6–15.4)
Retic Ct Pct: 4.5 % — ABNORMAL HIGH (ref 0.6–2.6)
Sodium: 137 mmol/L (ref 134–144)
Total Protein: 7.4 g/dL (ref 6.0–8.5)
Vit D, 25-Hydroxy: 17.3 ng/mL — ABNORMAL LOW (ref 30.0–100.0)
WBC: 12.1 10*3/uL — ABNORMAL HIGH (ref 3.4–10.8)
eGFR: 134 mL/min/{1.73_m2} (ref >59)

## 2022-04-17 LAB — PSA: Prostate Specific Ag, Serum: 0.4 ng/mL (ref 0.0–4.0)

## 2022-04-17 LAB — HIV ANTIBODY (ROUTINE TESTING W REFLEX): HIV Screen 4th Generation wRfx: NONREACTIVE

## 2022-04-18 LAB — URINE CULTURE

## 2022-04-19 LAB — GC/CHLAMYDIA PROBE AMP
Chlamydia trachomatis, NAA: NEGATIVE
Neisseria Gonorrhoeae by PCR: NEGATIVE

## 2022-05-08 ENCOUNTER — Other Ambulatory Visit: Payer: Self-pay

## 2022-05-08 ENCOUNTER — Emergency Department (HOSPITAL_BASED_OUTPATIENT_CLINIC_OR_DEPARTMENT_OTHER): Payer: Medicaid Other

## 2022-05-08 ENCOUNTER — Telehealth: Payer: Self-pay

## 2022-05-08 ENCOUNTER — Inpatient Hospital Stay (HOSPITAL_BASED_OUTPATIENT_CLINIC_OR_DEPARTMENT_OTHER)
Admission: EM | Admit: 2022-05-08 | Discharge: 2022-05-12 | DRG: 811 | Disposition: A | Discharge status: Home / Self Care | Payer: Medicaid Other | Attending: Internal Medicine | Admitting: Internal Medicine

## 2022-05-08 ENCOUNTER — Encounter (HOSPITAL_BASED_OUTPATIENT_CLINIC_OR_DEPARTMENT_OTHER): Payer: Self-pay | Admitting: Emergency Medicine

## 2022-05-08 DIAGNOSIS — G894 Chronic pain syndrome: Secondary | ICD-10-CM | POA: Diagnosis present

## 2022-05-08 DIAGNOSIS — Z8249 Family history of ischemic heart disease and other diseases of the circulatory system: Secondary | ICD-10-CM | POA: Diagnosis not present

## 2022-05-08 DIAGNOSIS — J9601 Acute respiratory failure with hypoxia: Secondary | ICD-10-CM | POA: Diagnosis not present

## 2022-05-08 DIAGNOSIS — D638 Anemia in other chronic diseases classified elsewhere: Secondary | ICD-10-CM | POA: Diagnosis present

## 2022-05-08 DIAGNOSIS — I119 Hypertensive heart disease without heart failure: Secondary | ICD-10-CM | POA: Diagnosis not present

## 2022-05-08 DIAGNOSIS — D5700 Hb-SS disease with crisis, unspecified: Secondary | ICD-10-CM | POA: Diagnosis present

## 2022-05-08 DIAGNOSIS — Z885 Allergy status to narcotic agent status: Secondary | ICD-10-CM

## 2022-05-08 DIAGNOSIS — N08 Glomerular disorders in diseases classified elsewhere: Secondary | ICD-10-CM | POA: Diagnosis not present

## 2022-05-08 DIAGNOSIS — Z83438 Family history of other disorder of lipoprotein metabolism and other lipidemia: Secondary | ICD-10-CM | POA: Diagnosis not present

## 2022-05-08 DIAGNOSIS — D5701 Hb-SS disease with acute chest syndrome: Principal | ICD-10-CM | POA: Diagnosis present

## 2022-05-08 DIAGNOSIS — Z79899 Other long term (current) drug therapy: Secondary | ICD-10-CM

## 2022-05-08 DIAGNOSIS — D571 Sickle-cell disease without crisis: Secondary | ICD-10-CM

## 2022-05-08 DIAGNOSIS — Z832 Family history of diseases of the blood and blood-forming organs and certain disorders involving the immune mechanism: Secondary | ICD-10-CM

## 2022-05-08 DIAGNOSIS — D72829 Elevated white blood cell count, unspecified: Secondary | ICD-10-CM | POA: Diagnosis not present

## 2022-05-08 DIAGNOSIS — Z888 Allergy status to other drugs, medicaments and biological substances status: Secondary | ICD-10-CM | POA: Diagnosis not present

## 2022-05-08 DIAGNOSIS — F1120 Opioid dependence, uncomplicated: Secondary | ICD-10-CM | POA: Diagnosis not present

## 2022-05-08 DIAGNOSIS — F1190 Opioid use, unspecified, uncomplicated: Secondary | ICD-10-CM

## 2022-05-08 LAB — COMPREHENSIVE METABOLIC PANEL
ALT: 19 U/L (ref 0–44)
AST: 41 U/L (ref 15–41)
Albumin: 4.1 g/dL (ref 3.5–5.0)
Alkaline Phosphatase: 89 U/L (ref 38–126)
Anion gap: 7 (ref 5–15)
BUN: 13 mg/dL (ref 6–20)
CO2: 26 mmol/L (ref 22–32)
Calcium: 9 mg/dL (ref 8.9–10.3)
Chloride: 103 mmol/L (ref 98–111)
Creatinine, Ser: 0.75 mg/dL (ref 0.61–1.24)
GFR, Estimated: >60 mL/min (ref >60)
Glucose, Bld: 97 mg/dL (ref 70–99)
Potassium: 4.3 mmol/L (ref 3.5–5.1)
Sodium: 136 mmol/L (ref 135–145)
Total Bilirubin: 3.6 mg/dL — ABNORMAL HIGH (ref 0.3–1.2)
Total Protein: 7.3 g/dL (ref 6.5–8.1)

## 2022-05-08 LAB — CBC WITH DIFFERENTIAL/PLATELET
Abs Immature Granulocytes: 0.05 10*3/uL (ref 0.00–0.07)
Basophils Absolute: 0.2 10*3/uL — ABNORMAL HIGH (ref 0.0–0.1)
Basophils Relative: 1 %
Eosinophils Absolute: 0.3 10*3/uL (ref 0.0–0.5)
Eosinophils Relative: 2 %
HCT: 24.9 % — ABNORMAL LOW (ref 39.0–52.0)
Hemoglobin: 9.2 g/dL — ABNORMAL LOW (ref 13.0–17.0)
Immature Granulocytes: 0 %
Lymphocytes Relative: 31 %
Lymphs Abs: 4.2 10*3/uL — ABNORMAL HIGH (ref 0.7–4.0)
MCH: 30.1 pg (ref 26.0–34.0)
MCHC: 36.9 g/dL — ABNORMAL HIGH (ref 30.0–36.0)
MCV: 81.4 fL (ref 80.0–100.0)
Monocytes Absolute: 1.4 10*3/uL — ABNORMAL HIGH (ref 0.1–1.0)
Monocytes Relative: 10 %
Neutro Abs: 7.4 10*3/uL (ref 1.7–7.7)
Neutrophils Relative %: 56 %
Platelets: 332 10*3/uL (ref 150–400)
RBC: 3.06 MIL/uL — ABNORMAL LOW (ref 4.22–5.81)
RDW: 19.9 % — ABNORMAL HIGH (ref 11.5–15.5)
WBC: 13.6 10*3/uL — ABNORMAL HIGH (ref 4.0–10.5)
nRBC: 0.3 % — ABNORMAL HIGH (ref 0.0–0.2)

## 2022-05-08 LAB — RETICULOCYTES
Immature Retic Fract: 37.1 % — ABNORMAL HIGH (ref 2.3–15.9)
RBC.: 3.04 MIL/uL — ABNORMAL LOW (ref 4.22–5.81)
Retic Count, Absolute: 180.6 10*3/uL (ref 19.0–186.0)
Retic Ct Pct: 6 % — ABNORMAL HIGH (ref 0.4–3.1)

## 2022-05-08 LAB — CBC
HCT: 26.5 % — ABNORMAL LOW (ref 39.0–52.0)
Hemoglobin: 9.5 g/dL — ABNORMAL LOW (ref 13.0–17.0)
MCH: 29.7 pg (ref 26.0–34.0)
MCHC: 35.8 g/dL (ref 30.0–36.0)
MCV: 82.8 fL (ref 80.0–100.0)
Platelets: 334 10*3/uL (ref 150–400)
RBC: 3.2 MIL/uL — ABNORMAL LOW (ref 4.22–5.81)
RDW: 19.8 % — ABNORMAL HIGH (ref 11.5–15.5)
WBC: 11.7 10*3/uL — ABNORMAL HIGH (ref 4.0–10.5)
nRBC: 0.3 % — ABNORMAL HIGH (ref 0.0–0.2)

## 2022-05-08 LAB — CREATININE, SERUM
Creatinine, Ser: 0.52 mg/dL — ABNORMAL LOW (ref 0.61–1.24)
GFR, Estimated: >60 mL/min (ref >60)

## 2022-05-08 MED ORDER — HYDROMORPHONE HCL 1 MG/ML IJ SOLN
0.5000 mg | INTRAMUSCULAR | Status: AC
  Administered 2022-05-08: 0.5 mg via INTRAVENOUS
  Filled 2022-05-08: qty 1

## 2022-05-08 MED ORDER — SODIUM CHLORIDE 0.9 % IV SOLN
500.0000 mg | Freq: Once | INTRAVENOUS | Status: AC
  Administered 2022-05-08: 500 mg via INTRAVENOUS
  Filled 2022-05-08: qty 5

## 2022-05-08 MED ORDER — NALOXONE HCL 0.4 MG/ML IJ SOLN: 0.4000 mg | INTRAMUSCULAR | Status: DC | PRN

## 2022-05-08 MED ORDER — HYDROMORPHONE 1 MG/ML IV SOLN
INTRAVENOUS | Status: DC
  Administered 2022-05-08: 30 mg via INTRAVENOUS
  Administered 2022-05-09: 5 mg via INTRAVENOUS
  Administered 2022-05-09 (×2): 4.5 mg via INTRAVENOUS
  Administered 2022-05-09: 6.5 mg via INTRAVENOUS
  Administered 2022-05-09: 30 mg via INTRAVENOUS
  Administered 2022-05-09: 2 mg via INTRAVENOUS
  Administered 2022-05-09: 3 mg via INTRAVENOUS
  Administered 2022-05-10: 4 mg via INTRAVENOUS
  Administered 2022-05-10 (×2): 1 mg via INTRAVENOUS
  Filled 2022-05-08 (×2): qty 30

## 2022-05-08 MED ORDER — DIPHENHYDRAMINE HCL 50 MG/ML IJ SOLN
INTRAMUSCULAR | Status: AC
  Administered 2022-05-08: 12.5 mg via INTRAVENOUS
  Filled 2022-05-08: qty 1

## 2022-05-08 MED ORDER — SODIUM CHLORIDE 0.45 % IV SOLN: INTRAVENOUS | Status: DC

## 2022-05-08 MED ORDER — IOHEXOL 350 MG/ML SOLN
100.0000 mL | Freq: Once | INTRAVENOUS | Status: AC | PRN
Start: 2022-05-08 — End: 2022-05-08
  Administered 2022-05-08: 100 mL via INTRAVENOUS

## 2022-05-08 MED ORDER — HYDROMORPHONE HCL 1 MG/ML IJ SOLN
1.0000 mg | INTRAMUSCULAR | Status: AC | PRN
  Administered 2022-05-08 (×3): 1 mg via INTRAVENOUS
  Filled 2022-05-08 (×3): qty 1

## 2022-05-08 MED ORDER — HYDROMORPHONE HCL 1 MG/ML IJ SOLN: 1.0000 mg | INTRAMUSCULAR | Status: AC

## 2022-05-08 MED ORDER — HYDROMORPHONE HCL 1 MG/ML IJ SOLN
1.0000 mg | INTRAMUSCULAR | Status: AC
  Administered 2022-05-08: 1 mg via INTRAVENOUS
  Filled 2022-05-08: qty 1

## 2022-05-08 MED ORDER — SODIUM CHLORIDE 0.9 % IV SOLN
12.5000 mg | Freq: Once | INTRAVENOUS | Status: DC
  Filled 2022-05-08: qty 0.25

## 2022-05-08 MED ORDER — DIPHENHYDRAMINE HCL 50 MG/ML IJ SOLN: 12.5000 mg | Freq: Once | INTRAMUSCULAR | Status: AC

## 2022-05-08 MED ORDER — SODIUM CHLORIDE 0.9 % IV SOLN: INTRAVENOUS | Status: DC | PRN

## 2022-05-08 MED ORDER — ONDANSETRON HCL 4 MG/2ML IJ SOLN
4.0000 mg | Freq: Four times a day (QID) | INTRAMUSCULAR | Status: DC | PRN
  Administered 2022-05-08: 4 mg via INTRAVENOUS
  Filled 2022-05-08: qty 2

## 2022-05-08 MED ORDER — SODIUM CHLORIDE 0.9 % IV SOLN
1.0000 g | Freq: Once | INTRAVENOUS | Status: AC
  Administered 2022-05-08: 1 g via INTRAVENOUS
  Filled 2022-05-08: qty 10

## 2022-05-08 MED ORDER — ONDANSETRON HCL 4 MG/2ML IJ SOLN: 4.0000 mg | Freq: Four times a day (QID) | INTRAMUSCULAR | Status: DC | PRN

## 2022-05-08 MED ORDER — KETOROLAC TROMETHAMINE 15 MG/ML IJ SOLN
15.0000 mg | Freq: Four times a day (QID) | INTRAMUSCULAR | Status: DC
  Administered 2022-05-08 – 2022-05-12 (×15): 15 mg via INTRAVENOUS
  Filled 2022-05-08 (×14): qty 1

## 2022-05-08 MED ORDER — SENNOSIDES-DOCUSATE SODIUM 8.6-50 MG PO TABS
1.0000 | ORAL_TABLET | Freq: Two times a day (BID) | ORAL | Status: DC
  Administered 2022-05-08 – 2022-05-12 (×8): 1 via ORAL
  Filled 2022-05-08 (×8): qty 1

## 2022-05-08 MED ORDER — DIPHENHYDRAMINE HCL 25 MG PO CAPS: 25.0000 mg | ORAL_CAPSULE | ORAL | Status: DC | PRN

## 2022-05-08 MED ORDER — SODIUM CHLORIDE 0.9% FLUSH: 9.0000 mL | INTRAVENOUS | Status: DC | PRN

## 2022-05-08 MED ORDER — ENOXAPARIN SODIUM 40 MG/0.4ML IJ SOSY
40.0000 mg | PREFILLED_SYRINGE | INTRAMUSCULAR | Status: DC
  Filled 2022-05-08 (×3): qty 0.4

## 2022-05-08 MED ORDER — POLYETHYLENE GLYCOL 3350 17 G PO PACK
17.0000 g | PACK | Freq: Every day | ORAL | Status: DC | PRN
  Administered 2022-05-11: 17 g via ORAL
  Filled 2022-05-08: qty 1

## 2022-05-08 NOTE — ED Notes (Signed)
Urinal provided for pt comfort
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
## 2022-05-08 NOTE — ED Provider Notes (Signed)
[facility] EMERGENCY DEPARTMENT  Provider Note  CSN: [id number] Arrival date & time: 05/08/22 0316  History Chief Complaint  Patient presents with   Sickle Cell Pain Crisis    [name] Hill is a 21 y.o. male with history of sickle cell disease reports 2 days of pain, mostly in his lower back which he felt was getting better with his home pain meds but tonight he began to have a more severe pleuritic pain in R lateral and posterior chest wall. Hurts to take a deep breath but no cough or fever. This is similar to previous acute chest presentations. He has had several CTA chest in the past but never diagnosed with PE.    Home Medications Prior to Admission medications   Medication Sig Start Date End Date Taking? Authorizing Provider  acetaminophen (TYLENOL) 500 MG tablet Take 1,000 mg by mouth daily as needed for mild pain.    [provider]  folic acid (FOLVITE) 1 MG tablet Take 1 tablet (1 mg total) by mouth daily. 04/10/22 04/10/23  Massie Maroon, FNP  ibuprofen (ADVIL) 800 MG tablet Take 1 tablet (800 mg total) by mouth every 8 (eight) hours as needed. 01/10/22   Massie Maroon, FNP  Oxycodone HCl 10 MG TABS Take 1 tablet (10 mg total) by mouth every 4 (four) hours as needed (pain). 04/10/22   Massie Maroon, FNP     Allergies    Lisinopril and Toradol [ketorolac tromethamine]   Review of Systems   Review of Systems Please see HPI for pertinent positives and negatives  Physical Exam BP 119/68   Pulse 72   Temp 98.4 F (36.9 C) (Oral)   Resp 19   Ht 5\' 7"  (1.702 m)   Wt 92.3 kg   SpO2 90%   BMI 31.87 kg/m   Physical Exam Vitals and nursing note reviewed.  Constitutional:      Appearance: Normal appearance.  HENT:     Head: Normocephalic and atraumatic.     Nose: Nose normal.     Mouth/Throat:     Mouth: Mucous membranes are moist.  Eyes:     Extraocular Movements: Extraocular movements intact.     Conjunctiva/sclera: Conjunctivae  normal.  Cardiovascular:     Rate and Rhythm: Normal rate.  Pulmonary:     Effort: Pulmonary effort is normal.     Breath sounds: Normal breath sounds. No wheezing or rales.     Comments: Deceased air movement due to pain Abdominal:     General: Abdomen is flat.     Palpations: Abdomen is soft.     Tenderness: There is no abdominal tenderness.  Musculoskeletal:        General: No swelling. Normal range of motion.     Cervical back: Neck supple.  Skin:    General: Skin is warm and dry.  Neurological:     General: No focal deficit present.     Mental Status: He is alert.  Psychiatric:        Mood and Affect: Mood normal.     ED Results / Procedures / Treatments   EKG EKG Interpretation  Date/Time:  Wednesday May 08 2022 03:57:27 EDT Ventricular Rate:  77 PR Interval:  186 QRS Duration: 98 QT Interval:  369 QTC Calculation: 418 R Axis:   71 Text Interpretation: Sinus or ectopic atrial rhythm Probable left ventricular hypertrophy No significant change since last tracing Confirmed by Susy Frizzle [phone]) on 05/08/2022 4:02:58 AM  Procedures .Critical Care  Performed by: Truddie Hidden, MD Authorized by: Truddie Hidden, MD   Critical care provider statement:    Critical care time (minutes):  60   Critical care time was exclusive of:  Separately billable procedures and treating other patients   Critical care was necessary to treat or prevent imminent or life-threatening deterioration of the following conditions:  Respiratory failure   Critical care was time spent personally by me on the following activities:  Development of treatment plan with patient or surrogate, discussions with consultants, evaluation of patient's response to treatment, examination of patient, ordering and review of laboratory studies, ordering and review of radiographic studies, ordering and performing treatments and interventions, pulse oximetry, re-evaluation of patient's condition and review  of old charts   Care discussed with: admitting provider     Medications Ordered in the ED Medications  0.45 % sodium chloride infusion ( Intravenous New Bag/Given 05/08/22 0355)  HYDROmorphone (DILAUDID) injection 1 mg (has no administration in time range)  cefTRIAXone (ROCEPHIN) 1 g in sodium chloride 0.9 % 100 mL IVPB (has no administration in time range)  azithromycin (ZITHROMAX) 500 mg in sodium chloride 0.9 % 250 mL IVPB (has no administration in time range)  HYDROmorphone (DILAUDID) injection 0.5 mg (0.5 mg Intravenous Given 05/08/22 0354)  HYDROmorphone (DILAUDID) injection 1 mg (1 mg Intravenous Given 05/08/22 0436)  diphenhydrAMINE (BENADRYL) injection 12.5 mg (12.5 mg Intravenous Given 05/08/22 0354)  iohexol (OMNIPAQUE) 350 MG/ML injection 100 mL (100 mLs Intravenous Contrast Given 05/08/22 0535)    Initial Impression and Plan  Patient with history of Hgb-SS here with symptoms concerning for acute chest. He was noted to be hypoxic, as low as 89% on RA at rest. Started on supplemental oxygen. Will check labs, EKG, CXR, IVF and pain medications for comfort. He typically requires admission when he gets to this point.   ED Course   Clinical Course as of 05/08/22 0620  Wed May 08, 2022  0415 CBC with leukocytosis similar to labs done three weeks ago, Hgb has dropped some. BMP is unremarkable aside from baseline hyperbilirubinemia.  [CS]  [id number] I personally viewed the images from radiology studies and agree with radiologist interpretation: CXR without acute findings.   [CS]  0522 Patient still having some pain. Will send for CTA to rule out PE or other signs of acute chest syndrome  [CS]  0607 I personally viewed the images from radiology studies and agree with radiologist interpretation: CTA neg for PE, worsening airspace disease consistent with acute chest syndrome. He is resting comfortably now, sleeping in no distress. SpO2 improved with 2L [place].  Will discuss admission with hospitalist.  Grandmother at bedside aware of plan.  [CS]  [id number] Spoke with Dr. Alcario Drought, Hospitalist, who will accept for admission. Requests Abx for CAP be given to cover possible PNA.  [CS]    Clinical Course User Index [CS] Truddie Hidden, MD     MDM Rules/Calculators/A&P Medical Decision Making Problems Addressed: Acute chest syndrome due to sickle cell crisis [facility]): acute illness or injury Acute respiratory failure with hypoxia [facility]): acute illness or injury Sickle cell pain crisis [facility]): chronic illness or injury with exacerbation, progression, or side effects of treatment  Amount and/or Complexity of Data Reviewed Labs: ordered. Decision-making details documented in ED Course. Radiology: ordered and independent interpretation performed. Decision-making details documented in ED Course. ECG/medicine tests: ordered and independent interpretation performed. Decision-making details documented in ED Course.  Risk Prescription drug management. Parenteral controlled  substances. Decision regarding hospitalization.    Final Clinical Impression(s) / ED Diagnoses Final diagnoses:  Sickle cell pain crisis ([place])  Acute chest syndrome due to sickle cell crisis (HCC)  Acute respiratory failure with hypoxia [facility])    Rx / DC Orders ED Discharge Orders     None        Truddie Hidden, MD 05/08/22 [phone]

## 2022-05-08 NOTE — ED Triage Notes (Signed)
Pt c/o sickle cell pain. ?

## 2022-05-08 NOTE — ED Notes (Signed)
Pt awaiting transfer to [facility] by [facility]

## 2022-05-08 NOTE — Telephone Encounter (Signed)
Oxycodone  °

## 2022-05-09 DIAGNOSIS — I119 Hypertensive heart disease without heart failure: Secondary | ICD-10-CM | POA: Diagnosis present

## 2022-05-09 DIAGNOSIS — D638 Anemia in other chronic diseases classified elsewhere: Secondary | ICD-10-CM | POA: Diagnosis present

## 2022-05-09 DIAGNOSIS — Z83438 Family history of other disorder of lipoprotein metabolism and other lipidemia: Secondary | ICD-10-CM | POA: Diagnosis not present

## 2022-05-09 DIAGNOSIS — J9601 Acute respiratory failure with hypoxia: Secondary | ICD-10-CM | POA: Diagnosis present

## 2022-05-09 DIAGNOSIS — D72829 Elevated white blood cell count, unspecified: Secondary | ICD-10-CM | POA: Diagnosis present

## 2022-05-09 DIAGNOSIS — Z885 Allergy status to narcotic agent status: Secondary | ICD-10-CM | POA: Diagnosis not present

## 2022-05-09 DIAGNOSIS — Z79899 Other long term (current) drug therapy: Secondary | ICD-10-CM | POA: Diagnosis not present

## 2022-05-09 DIAGNOSIS — D5700 Hb-SS disease with crisis, unspecified: Secondary | ICD-10-CM | POA: Diagnosis not present

## 2022-05-09 DIAGNOSIS — G894 Chronic pain syndrome: Secondary | ICD-10-CM | POA: Diagnosis present

## 2022-05-09 DIAGNOSIS — Z888 Allergy status to other drugs, medicaments and biological substances status: Secondary | ICD-10-CM | POA: Diagnosis not present

## 2022-05-09 DIAGNOSIS — F1120 Opioid dependence, uncomplicated: Secondary | ICD-10-CM | POA: Diagnosis present

## 2022-05-09 DIAGNOSIS — N08 Glomerular disorders in diseases classified elsewhere: Secondary | ICD-10-CM | POA: Diagnosis present

## 2022-05-09 DIAGNOSIS — Z8249 Family history of ischemic heart disease and other diseases of the circulatory system: Secondary | ICD-10-CM | POA: Diagnosis not present

## 2022-05-09 DIAGNOSIS — D5701 Hb-SS disease with acute chest syndrome: Secondary | ICD-10-CM

## 2022-05-09 DIAGNOSIS — Z832 Family history of diseases of the blood and blood-forming organs and certain disorders involving the immune mechanism: Secondary | ICD-10-CM | POA: Diagnosis not present

## 2022-05-09 LAB — CBC WITH DIFFERENTIAL/PLATELET
Abs Immature Granulocytes: 0.03 10*3/uL (ref 0.00–0.07)
Basophils Absolute: 0.1 10*3/uL (ref 0.0–0.1)
Basophils Relative: 1 %
Eosinophils Absolute: 0.2 10*3/uL (ref 0.0–0.5)
Eosinophils Relative: 2 %
HCT: 25.4 % — ABNORMAL LOW (ref 39.0–52.0)
Hemoglobin: 9 g/dL — ABNORMAL LOW (ref 13.0–17.0)
Immature Granulocytes: 0 %
Lymphocytes Relative: 32 %
Lymphs Abs: 3.6 10*3/uL (ref 0.7–4.0)
MCH: 29.4 pg (ref 26.0–34.0)
MCHC: 35.4 g/dL (ref 30.0–36.0)
MCV: 83 fL (ref 80.0–100.0)
Monocytes Absolute: 1.2 10*3/uL — ABNORMAL HIGH (ref 0.1–1.0)
Monocytes Relative: 10 %
Neutro Abs: 6 10*3/uL (ref 1.7–7.7)
Neutrophils Relative %: 55 %
Platelets: 308 10*3/uL (ref 150–400)
RBC: 3.06 MIL/uL — ABNORMAL LOW (ref 4.22–5.81)
RDW: 19.7 % — ABNORMAL HIGH (ref 11.5–15.5)
WBC: 11.1 10*3/uL — ABNORMAL HIGH (ref 4.0–10.5)
nRBC: 0.4 % — ABNORMAL HIGH (ref 0.0–0.2)

## 2022-05-09 LAB — COMPREHENSIVE METABOLIC PANEL
ALT: 14 U/L (ref 0–44)
AST: 29 U/L (ref 15–41)
Albumin: 3.9 g/dL (ref 3.5–5.0)
Alkaline Phosphatase: 67 U/L (ref 38–126)
Anion gap: 6 (ref 5–15)
BUN: 7 mg/dL (ref 6–20)
CO2: 29 mmol/L (ref 22–32)
Calcium: 8.9 mg/dL (ref 8.9–10.3)
Chloride: 102 mmol/L (ref 98–111)
Creatinine, Ser: 0.59 mg/dL — ABNORMAL LOW (ref 0.61–1.24)
GFR, Estimated: >60 mL/min (ref >60)
Glucose, Bld: 105 mg/dL — ABNORMAL HIGH (ref 70–99)
Potassium: 4.1 mmol/L (ref 3.5–5.1)
Sodium: 137 mmol/L (ref 135–145)
Total Bilirubin: 3.1 mg/dL — ABNORMAL HIGH (ref 0.3–1.2)
Total Protein: 7 g/dL (ref 6.5–8.1)

## 2022-05-09 MED ORDER — SODIUM CHLORIDE 0.9 % IV SOLN
2.0000 g | INTRAVENOUS | Status: DC
  Administered 2022-05-09 – 2022-05-11 (×3): 2 g via INTRAVENOUS
  Filled 2022-05-09 (×3): qty 20

## 2022-05-09 MED ORDER — IPRATROPIUM-ALBUTEROL 0.5-2.5 (3) MG/3ML IN SOLN
3.0000 mL | Freq: Three times a day (TID) | RESPIRATORY_TRACT | Status: DC
  Administered 2022-05-09 – 2022-05-10 (×4): 3 mL via RESPIRATORY_TRACT
  Filled 2022-05-09 (×5): qty 3

## 2022-05-09 MED ORDER — GUAIFENESIN-DM 100-10 MG/5ML PO SYRP
5.0000 mL | ORAL_SOLUTION | ORAL | Status: DC | PRN
  Administered 2022-05-09: 5 mL via ORAL
  Filled 2022-05-09: qty 10

## 2022-05-09 MED ORDER — IPRATROPIUM-ALBUTEROL 0.5-2.5 (3) MG/3ML IN SOLN
3.0000 mL | Freq: Four times a day (QID) | RESPIRATORY_TRACT | Status: DC
  Administered 2022-05-09: 3 mL via RESPIRATORY_TRACT
  Filled 2022-05-09: qty 3

## 2022-05-09 NOTE — Progress Notes (Signed)
Subjective: [name] Hill is a 21 year old male with a medical history significant for sickle cell disease, chronic pain syndrome, opiate dependence and tolerance, and history of anemia of chronic disease was admitted for sickle cell pain crisis. Patient continues to endorse pain primarily to low back, chest, and lower extremities.  He rates pain as 7/10.  Patient also endorses cough and right-sided chest pain.  He denies any dizziness, paresthesias heart palpitations, urinary symptoms, nausea, vomiting, or diarrhea.  Objective:  Vital signs in last 24 hours:  Vitals:   05/09/22 1519 05/09/22 1909 05/09/22 2030 05/09/22 2032  BP:  134/77 128/70   Pulse:  90 83   Resp: 20 16 20 17   Temp:  98.6 F (37 C) 98.3 F (36.8 C)   TempSrc:  Oral Oral   SpO2: 93% 96% 93% 92%  Weight:      Height:        Intake/Output from previous day:   Intake/Output Summary (Last 24 hours) at 05/09/2022 2306 Last data filed at 05/09/2022 1800 Gross per 24 hour  Intake 3673.1 ml  Output 1850 ml  Net 1823.1 ml    Physical Exam: General: Alert, awake, oriented x3, in no acute distress.  HEENT: /AT PEERL, EOMI Neck: Trachea midline,  no masses, no thyromegal,y no JVD, no carotid bruit OROPHARYNX:  Moist, No exudate/ erythema/lesions.  Heart: Regular rate and rhythm, without murmurs, rubs, gallops, PMI non-displaced, no heaves or thrills on palpation.  Lungs: Clear to auscultation, no wheezing or rhonchi noted. No increased vocal fremitus resonant to percussion  Abdomen: Soft, nontender, nondistended, positive bowel sounds, no masses no hepatosplenomegaly noted..  Neuro: No focal neurological deficits noted cranial nerves II through XII grossly intact. DTRs 2+ bilaterally upper and lower extremities. Strength 5 out of 5 in bilateral upper and lower extremities. Musculoskeletal: No warm swelling or erythema around joints, no spinal tenderness noted. Psychiatric: Patient alert and oriented x3, good insight  and cognition, good recent to remote recall. Lymph node survey: No cervical axillary or inguinal lymphadenopathy noted.  Lab Results:  Basic Metabolic Panel:    Component Value Date/Time   NA 137 05/09/2022 0612   NA 137 04/16/2022 1413   K 4.1 05/09/2022 0612   CL 102 05/09/2022 0612   CO2 29 05/09/2022 0612   BUN 7 05/09/2022 0612   BUN 6 04/16/2022 1413   CREATININE 0.59 (L) 05/09/2022 0612   GLUCOSE 105 (H) 05/09/2022 0612   CALCIUM 8.9 05/09/2022 0612   CBC:    Component Value Date/Time   WBC 11.1 (H) 05/09/2022 0612   HGB 9.0 (L) 05/09/2022 0612   HGB 11.1 (L) 04/16/2022 1413   HCT 25.4 (L) 05/09/2022 0612   HCT 33.4 (L) 04/16/2022 1413   PLT 308 05/09/2022 0612   PLT 760 (H) 04/16/2022 1413   MCV 83.0 05/09/2022 0612   MCV 86 04/16/2022 1413   NEUTROABS 6.0 05/09/2022 0612   NEUTROABS 7.7 (H) 04/16/2022 1413   LYMPHSABS 3.6 05/09/2022 0612   LYMPHSABS 2.8 04/16/2022 1413   MONOABS 1.2 (H) 05/09/2022 0612   EOSABS 0.2 05/09/2022 0612   EOSABS 0.2 04/16/2022 1413   BASOSABS 0.1 05/09/2022 0612   BASOSABS 0.1 04/16/2022 1413    No results found for this or any previous visit (from the past 240 hour(s)).  Studies/Results: CT Angio Chest PE W and/or Wo Contrast  Result Date: 05/08/2022 CLINICAL DATA:  21 year old male with history of sickle cell disease. Pleuritic chest pain and hypoxia. Evaluate for pulmonary embolism  versus acute chest syndrome. EXAM: CT ANGIOGRAPHY CHEST WITH CONTRAST TECHNIQUE: Multidetector CT imaging of the chest was performed using the standard protocol during bolus administration of intravenous contrast. Multiplanar CT image reconstructions and MIPs were obtained to evaluate the vascular anatomy. RADIATION DOSE REDUCTION: This exam was performed according to the departmental dose-optimization program which includes automated exposure control, adjustment of the mA and/or kV according to patient size and/or use of iterative reconstruction  technique. CONTRAST:  OMNIPAQUE IOHEXOL 350 MG/ML SOLN COMPARISON:  Chest CT 03/28/2022. FINDINGS: Cardiovascular: No filling defects in the pulmonary arterial tree to suggest pulmonary embolism. Heart size is mildly enlarged. There is no significant pericardial fluid, thickening or pericardial calcification. No atherosclerotic calcifications in the thoracic aorta or the coronary arteries. Left vertebral artery arises directly from the aortic arch (normal anatomical variant) incidentally noted. Mediastinum/Nodes: No pathologically enlarged mediastinal or hilar lymph nodes. Esophagus is unremarkable in appearance. No axillary lymphadenopathy. Lungs/Pleura: Patchy areas of peribronchovascular ground-glass attenuation, thickening of the peribronchovascular interstitium and regional architectural distortion and volume loss are noted in the lower lobes of the lungs bilaterally, increased compared to the recent prior study. Trace right pleural effusion lying dependently. No definite left pleural effusion. Mid to upper lungs are otherwise clear. Upper Abdomen: Spleen is severely atrophic and densely calcified. Musculoskeletal: Diffuse sclerosis throughout the visualized axial and appendicular skeleton, sequela of sickle cell disease. H-shaped vertebral bodies again noted. No aggressive appearing lytic or blastic lesions are otherwise noted in the visualized portions of the skeleton. Review of the MIP images confirms the above findings. IMPRESSION: 1. No evidence of pulmonary embolism. 2. Worsening areas of airspace consolidation in the lung bases, superimposed upon areas of chronic scarring, in addition to new trace right pleural effusion lying dependently. Imaging findings are concerning for acute chest syndrome. 3. Chronic imaging stigmata of sickle cell disease redemonstrated. Electronically Signed   By: Trudie Reed M.D.   On: 05/08/2022 05:59   DG Chest 2 View  Result Date: 05/08/2022 CLINICAL DATA:   21 year old male with sickle cell disease. Pleuritic chest pain. Hypoxia. EXAM: CHEST - 2 VIEW COMPARISON:  CTA chest 03/28/2022 and earlier. FINDINGS: PA and lateral views at 0406 hours. Stable mild-to-moderate cardiomegaly. Other mediastinal contours are within normal limits. Stable lung volumes with unchanged mild elevation of the right hemidiaphragm, normal variant. No pneumothorax, pulmonary edema, pleural effusion, or confluent pulmonary opacity. Visualized tracheal air column is within normal limits. H-shaped vertebral endplate deformities of sickle cell disease. No acute osseous abnormality identified. Negative visible bowel gas. IMPRESSION: Sequelae of sickle cell disease including cardiomegaly. No acute cardiopulmonary abnormality. Electronically Signed   By: Odessa Fleming M.D.   On: 05/08/2022 04:04    Medications: Scheduled Meds:  enoxaparin (LOVENOX) injection  40 mg Subcutaneous Q24H   HYDROmorphone   Intravenous Q4H   ipratropium-albuterol  3 mL Nebulization TID   ketorolac  15 mg Intravenous Q6H   senna-docusate  1 tablet Oral BID   Continuous Infusions:  sodium chloride 100 mL/hr at 05/09/22 1518   sodium chloride Stopped (05/08/22 0937)   cefTRIAXone (ROCEPHIN)  IV 2 g (05/09/22 1527)   PRN Meds:.sodium chloride, diphenhydrAMINE, guaiFENesin-dextromethorphan, naloxone **AND** sodium chloride flush, ondansetron (ZOFRAN) IV, ondansetron (ZOFRAN) IV, polyethylene glycol  Consultants: none  Procedures: none  Antibiotics: IV Rocephin  Assessment/Plan: Principal Problem:   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 Patient endorses cough, shortness of breath and some wheezing.  Will initiate IV antibiotics.  Supplemental oxygen, titrate as tolerated.  Sickle cell pain crisis: Continue IV Dilaudid PCA without any changes  today Toradol 15 mg IV every 6 hours Decrease IV fluids to [name] Monitor vital signs very closely, reevaluate pain scale regularly, and supplemental oxygen as needed  Anemia of chronic disease: Patient's hemoglobin is stable and consistent with his baseline.  There is no indication for blood transfusion at this time  Chronic pain syndrome: Continue home medications  History of hypertension: Stable.  Continue home medications.  Code Status: Full Code Family Communication: N/A Disposition Plan: Not yet ready for discharge  Petrona Wyeth Rennis Petty  APRN, MSN, FNP-C Patient Care Center [facility] [address] [place], [place] [address] [phone]  If 7PM-7AM, please contact night-coverage.  05/09/2022, 11:06 PM  LOS: 0 days

## 2022-05-09 NOTE — Clinical Note (Incomplete)
Patient oxygen level decreased to 84% on 2 liters of oxygen. Oxygen level up to 90% on % liters of oxygen via nasal cannula. Patient denies shortness of breath but does have a non productive cough. [name] Hollis, NP notified.

## 2022-05-09 NOTE — Progress Notes (Signed)
  Transition of Care [facility]) Screening Note   Patient Details  Name: [name] Hill Date of Birth: [birth year]   Transition of Care [facility]) CM/SW Contact:    Dotsie Gillette, Meriam Sprague, RN Phone Number: 05/09/2022, 1:41 PM    Transition of Care [facility]) has reviewed patient and no TOC needs have been identified at this time. We will continue to monitor patient advancement through interdisciplinary progression rounds. If new patient transition needs arise, please place a TOC consult.

## 2022-05-10 LAB — CBC
HCT: 22.7 % — ABNORMAL LOW (ref 39.0–52.0)
Hemoglobin: 8.5 g/dL — ABNORMAL LOW (ref 13.0–17.0)
MCH: 30.4 pg (ref 26.0–34.0)
MCHC: 37.4 g/dL — ABNORMAL HIGH (ref 30.0–36.0)
MCV: 81.1 fL (ref 80.0–100.0)
Platelets: 321 10*3/uL (ref 150–400)
RBC: 2.8 MIL/uL — ABNORMAL LOW (ref 4.22–5.81)
RDW: 19.3 % — ABNORMAL HIGH (ref 11.5–15.5)
WBC: 11 10*3/uL — ABNORMAL HIGH (ref 4.0–10.5)
nRBC: 0.2 % (ref 0.0–0.2)

## 2022-05-10 MED ORDER — HYDROMORPHONE 1 MG/ML IV SOLN
INTRAVENOUS | Status: DC
  Administered 2022-05-10: 5.5 mg via INTRAVENOUS
  Administered 2022-05-10: 4 mg via INTRAVENOUS
  Administered 2022-05-11: 3.5 mg via INTRAVENOUS
  Administered 2022-05-11: 4 mg via INTRAVENOUS
  Administered 2022-05-11: 30 mg via INTRAVENOUS
  Administered 2022-05-12: 6 mg via INTRAVENOUS
  Administered 2022-05-12: 1.5 mg via INTRAVENOUS
  Administered 2022-05-12: 1 mg via INTRAVENOUS
  Filled 2022-05-10: qty 30

## 2022-05-10 NOTE — Plan of Care (Signed)

## 2022-05-10 NOTE — Progress Notes (Signed)
Subjective: [name] Hill is a 21 year old male with a medical history significant for sickle cell disease, chronic pain syndrome, opiate dependence and tolerance, and history of anemia of chronic disease was admitted for sickle cell pain crisis. Patient continues to endorse pain primarily to low back, chest, and lower extremities.  He states that pain has not relieved overnight.  His pain intensity is 3/10.  Patient has been ambulating in room.  He continues to have an oxygen requirement of 4 L.  Patient is on 2 L supplemental oxygen at home.  Patient also endorses cough and right-sided chest pain.  He denies any dizziness, paresthesias heart palpitations, urinary symptoms, nausea, vomiting, or diarrhea.  Objective:  Vital signs in last 24 hours:  Vitals:   05/10/22 0839 05/10/22 1042 05/10/22 1203 05/10/22 1440  BP:  123/65  128/64  Pulse:  73  81  Resp:  17 17 18   Temp:  98 F (36.7 C)  98.5 F (36.9 C)  TempSrc:  Oral  Oral  SpO2: 92% 92% 92% 97%  Weight:      Height:        Intake/Output from previous day:   Intake/Output Summary (Last 24 hours) at 05/10/2022 1552 Last data filed at 05/10/2022 1441 Gross per 24 hour  Intake 2458.23 ml  Output 2400 ml  Net 58.23 ml    Physical Exam: General: Alert, awake, oriented x3, in no acute distress.  HEENT: /AT PEERL, EOMI Neck: Trachea midline,  no masses, no thyromegal,y no JVD, no carotid bruit OROPHARYNX:  Moist, No exudate/ erythema/lesions.  Heart: Regular rate and rhythm, without murmurs, rubs, gallops, PMI non-displaced, no heaves or thrills on palpation.  Lungs: Clear to auscultation, no wheezing or rhonchi noted. No increased vocal fremitus resonant to percussion  Abdomen: Soft, nontender, nondistended, positive bowel sounds, no masses no hepatosplenomegaly noted..  Neuro: No focal neurological deficits noted cranial nerves II through XII grossly intact. DTRs 2+ bilaterally upper and lower extremities. Strength 5 out of 5  in bilateral upper and lower extremities. Musculoskeletal: No warm swelling or erythema around joints, no spinal tenderness noted. Psychiatric: Patient alert and oriented x3, good insight and cognition, good recent to remote recall. Lymph node survey: No cervical axillary or inguinal lymphadenopathy noted.  Lab Results:  Basic Metabolic Panel:    Component Value Date/Time   NA 137 05/09/2022 0612   NA 137 04/16/2022 1413   K 4.1 05/09/2022 0612   CL 102 05/09/2022 0612   CO2 29 05/09/2022 0612   BUN 7 05/09/2022 0612   BUN 6 04/16/2022 1413   CREATININE 0.59 (L) 05/09/2022 0612   GLUCOSE 105 (H) 05/09/2022 0612   CALCIUM 8.9 05/09/2022 0612   CBC:    Component Value Date/Time   WBC 11.0 (H) 05/10/2022 1024   HGB 8.5 (L) 05/10/2022 1024   HGB 11.1 (L) 04/16/2022 1413   HCT 22.7 (L) 05/10/2022 1024   HCT 33.4 (L) 04/16/2022 1413   PLT 321 05/10/2022 1024   PLT 760 (H) 04/16/2022 1413   MCV 81.1 05/10/2022 1024   MCV 86 04/16/2022 1413   NEUTROABS 6.0 05/09/2022 0612   NEUTROABS 7.7 (H) 04/16/2022 1413   LYMPHSABS 3.6 05/09/2022 0612   LYMPHSABS 2.8 04/16/2022 1413   MONOABS 1.2 (H) 05/09/2022 0612   EOSABS 0.2 05/09/2022 0612   EOSABS 0.2 04/16/2022 1413   BASOSABS 0.1 05/09/2022 0612   BASOSABS 0.1 04/16/2022 1413    No results found for this or any previous visit (from the  past 240 hour(s)).  Studies/Results: No results found.  Medications: Scheduled Meds:  enoxaparin (LOVENOX) injection  40 mg Subcutaneous Q24H   HYDROmorphone   Intravenous Q4H   ipratropium-albuterol  3 mL Nebulization TID   ketorolac  15 mg Intravenous Q6H   senna-docusate  1 tablet Oral BID   Continuous Infusions:  sodium chloride 100 mL/hr at 05/10/22 1202   sodium chloride Stopped (05/08/22 0937)   cefTRIAXone (ROCEPHIN)  IV Stopped (05/09/22 1557)   PRN Meds:.sodium chloride, diphenhydrAMINE, guaiFENesin-dextromethorphan, naloxone **AND** sodium chloride flush, ondansetron (ZOFRAN)  IV, ondansetron (ZOFRAN) IV, polyethylene glycol  Consultants: none  Procedures: none  Antibiotics: IV Rocephin  Assessment/Plan: Principal Problem:   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 Patient endorses cough, shortness of breath and some wheezing.  Continue IV antibiotics..  Supplemental oxygen, titrate as tolerated.  Sickle cell pain crisis: Continue IV Dilaudid PCA, decreased dose. Toradol 15 mg IV every 6 hours Decrease IV fluids to [facility] Monitor vital signs very closely, reevaluate pain scale regularly, and supplemental oxygen as needed  Anemia of chronic disease: Patient's hemoglobin is stable and consistent with his baseline.  There is no indication for blood transfusion at this time  Chronic pain syndrome: Continue home medications  History of hypertension: Stable.  Continue home medications.  Code Status: Full Code Family Communication: N/A Disposition Plan: Not yet ready for discharge  Mally Gavina Rennis Petty  APRN, MSN, FNP-C Patient Care Center [facility] [address] [place], [place] [address] [phone]  If 7PM-7AM, please contact night-coverage.  05/10/2022, 3:52 PM  LOS: 1 day

## 2022-05-11 MED ORDER — IPRATROPIUM-ALBUTEROL 0.5-2.5 (3) MG/3ML IN SOLN
3.0000 mL | Freq: Two times a day (BID) | RESPIRATORY_TRACT | Status: DC
  Administered 2022-05-11: 3 mL via RESPIRATORY_TRACT
  Filled 2022-05-11: qty 3

## 2022-05-11 NOTE — Progress Notes (Signed)
Subjective: [name] Hill is a 21 year old male with a medical history significant for sickle cell disease, chronic pain syndrome, opiate dependence and tolerance, and history of anemia of chronic disease was admitted for sickle cell pain crisis. Patient continues to endorse pain primarily to low back, chest, and lower extremities.  He states that pain has not relieved overnight.  His pain intensity is 3/10.  Patient has been ambulating in room.  He continues to have an oxygen requirement of 4 L.  Patient is on 2 L supplemental oxygen at home.  Patient also endorses cough and right-sided chest pain.  He denies any dizziness, paresthesias heart palpitations, urinary symptoms, nausea, vomiting, or diarrhea.  Objective:  Vital signs in last 24 hours:  Vitals:   05/11/22 1725 05/11/22 1952 05/11/22 2031 05/11/22 2133  BP: 136/79   (!) 151/66  Pulse: 73   85  Resp: 18  16 (!) 22  Temp: 99.6 F (37.6 C)   98.2 F (36.8 C)  TempSrc: Oral   Oral  SpO2: 100% 100% 99% 99%  Weight:      Height:        Intake/Output from previous day:   Intake/Output Summary (Last 24 hours) at 05/11/2022 2316 Last data filed at 05/11/2022 2100 Gross per 24 hour  Intake 2734.37 ml  Output 800 ml  Net 1934.37 ml     Physical Exam: General: Alert, awake, oriented x3, in no acute distress.  HEENT: [place]/AT PEERL, EOMI Neck: Trachea midline,  no masses, no thyromegal,y no JVD, no carotid bruit OROPHARYNX:  Moist, No exudate/ erythema/lesions.  Heart: Regular rate and rhythm, without murmurs, rubs, gallops, PMI non-displaced, no heaves or thrills on palpation.  Lungs: Clear to auscultation, no wheezing or rhonchi noted. No increased vocal fremitus resonant to percussion  Abdomen: Soft, nontender, nondistended, positive bowel sounds, no masses no hepatosplenomegaly noted..  Neuro: No focal neurological deficits noted cranial nerves II through XII grossly intact. DTRs 2+ bilaterally upper and lower extremities.  Strength 5 out of 5 in bilateral upper and lower extremities. Musculoskeletal: No warm swelling or erythema around joints, no spinal tenderness noted. Psychiatric: Patient alert and oriented x3, good insight and cognition, good recent to remote recall. Lymph node survey: No cervical axillary or inguinal lymphadenopathy noted.  Lab Results:  Basic Metabolic Panel:    Component Value Date/Time   NA 137 05/09/2022 0612   NA 137 04/16/2022 1413   K 4.1 05/09/2022 0612   CL 102 05/09/2022 0612   CO2 29 05/09/2022 0612   BUN 7 05/09/2022 0612   BUN 6 04/16/2022 1413   CREATININE 0.59 (L) 05/09/2022 0612   GLUCOSE 105 (H) 05/09/2022 0612   CALCIUM 8.9 05/09/2022 0612   CBC:    Component Value Date/Time   WBC 11.0 (H) 05/10/2022 1024   HGB 8.5 (L) 05/10/2022 1024   HGB 11.1 (L) 04/16/2022 1413   HCT 22.7 (L) 05/10/2022 1024   HCT 33.4 (L) 04/16/2022 1413   PLT 321 05/10/2022 1024   PLT 760 (H) 04/16/2022 1413   MCV 81.1 05/10/2022 1024   MCV 86 04/16/2022 1413   NEUTROABS 6.0 05/09/2022 0612   NEUTROABS 7.7 (H) 04/16/2022 1413   LYMPHSABS 3.6 05/09/2022 0612   LYMPHSABS 2.8 04/16/2022 1413   MONOABS 1.2 (H) 05/09/2022 0612   EOSABS 0.2 05/09/2022 0612   EOSABS 0.2 04/16/2022 1413   BASOSABS 0.1 05/09/2022 0612   BASOSABS 0.1 04/16/2022 1413    No results found for this or any previous  visit (from the past 240 hour(s)).  Studies/Results: No results found.  Medications: Scheduled Meds:  enoxaparin (LOVENOX) injection  40 mg Subcutaneous Q24H   HYDROmorphone   Intravenous Q4H   ipratropium-albuterol  3 mL Nebulization BID   ketorolac  15 mg Intravenous Q6H   senna-docusate  1 tablet Oral BID   Continuous Infusions:  sodium chloride 100 mL/hr at 05/11/22 2031   sodium chloride Stopped (05/08/22 0937)   cefTRIAXone (ROCEPHIN)  IV Stopped (05/11/22 1500)   PRN Meds:.sodium chloride, diphenhydrAMINE, guaiFENesin-dextromethorphan, naloxone **AND** sodium chloride flush,  ondansetron (ZOFRAN) IV, ondansetron (ZOFRAN) IV, polyethylene glycol  Consultants: none  Procedures: none  Antibiotics: IV Rocephin  Assessment/Plan: Principal Problem:   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 Resolved.  Patient is still having some mild cough.  Continue monitoring..  Sickle cell pain crisis: Continue IV Dilaudid PCA, decreased dose. Toradol 15 mg IV every 6 hours Decrease IV fluids to [facility] Monitor vital signs very closely, reevaluate pain scale regularly, and supplemental oxygen as needed.  May likely be discharged in the next 24 hours if stable  Anemia of chronic disease: Patient's hemoglobin is stable and consistent with his baseline.  There is no indication for blood transfusion at this time  Chronic pain syndrome: Continue home medications  History of hypertension: Stable.  Continue home medications.  Code Status: Full Code Family Communication: N/A Disposition Plan: Not yet ready for discharge  Rometta Emery, MD  If 7PM-7AM, please contact night-coverage.  05/11/2022, 11:16 PM  LOS: 2 days

## 2022-05-11 NOTE — Plan of Care (Signed)

## 2022-05-12 MED ORDER — OXYCODONE HCL 10 MG PO TABS: 10.0000 mg | ORAL_TABLET | ORAL | 0 refills | Status: DC | PRN

## 2022-05-12 NOTE — Progress Notes (Signed)
Discharge instructions gone over with patient. One prescription called into his pharmacy that needs to be picked up. [name] Hill ambulating to the bathroom without any shortness of breath. He refused a wheelchair at discharged. His grandmother is here to take him home.

## 2022-05-12 NOTE — Discharge Summary (Signed)
Physician Discharge Summary   Patient: [name] Hill MRN: [id number] DOB: Sep 18, 2001  Admit date:     05/08/2022  Discharge date: 05/12/22  Discharge Physician: Lonia Blood   PCP: Massie Maroon, FNP   Recommendations at discharge:   Prescriptions refilled.  Follow-up with your PCP.  Stay hydrated.  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 [facility] Course: Patient was admitted and treated with IV Dilaudid, PCA was used, IV Rocephin for possible acute chest syndrome, Toradol and IV fluids.  He did very well.  No evidence of pneumonia or other infections.  He did eat much better.  Transition to home regimen.  No need for ongoing antibiotics.  Patient discharged ultimately with his oral home medications.  He will follow-up with PCP.  All other chronic medical problems were stable.  Hemoglobin was stable.  Pain control - [employer] Controlled Substance Reporting System database was reviewed. and patient was instructed, not to drive, operate heavy machinery, perform activities at heights, swimming or participation in water activities or provide baby-sitting services while on Pain, Sleep and Anxiety Medications; until their outpatient Physician has advised to do so again. Also recommended to not to take more than prescribed Pain, Sleep and Anxiety Medications.  Consultants: None Procedures performed: Chest x-ray Disposition: Home Diet recommendation:  Discharge Diet Orders (From admission, onward)     Start     Ordered   05/12/22 0000  Diet - low sodium heart healthy        05/12/22 1325           Regular diet DISCHARGE MEDICATION: Allergies as of 05/12/2022       Reactions   Lisinopril Cough   Pork-derived Products    Patient does eat pork.   Toradol  [ketorolac Tromethamine] Itching        Medication List     TAKE these medications    acetaminophen 500 MG tablet Commonly known as: TYLENOL Take 500 mg by mouth daily as needed for mild pain.   folic acid 1 MG tablet Commonly known as: FOLVITE Take 1 tablet (1 mg total) by mouth daily.   ibuprofen 800 MG tablet Commonly known as: ADVIL Take 1 tablet (800 mg total) by mouth every 8 (eight) hours as needed. What changed: reasons to take this   Oxycodone HCl 10 MG Tabs Take 1 tablet (10 mg total) by mouth every 4 (four) hours as needed (pain).        Discharge Exam: Filed Weights   05/08/22 0322 05/08/22 0425 05/08/22 1819  Weight: 89.4 kg 92.3 kg 89.4 kg   General: Stable, no acute distress HEENT: PRL, no pallor no jaundice no rhinorrhea: Neck: Supple, no JVD no lymphadenopathy Respiratory: Good air entry bilaterally no wheezes or crackles Cardiovascular: Regular rate and rhythm Abdomen: Soft, nontender with positive bowel sounds Extremities: No edema cyanosis or clubbing  Condition at discharge: good  The results of significant diagnostics from this hospitalization (including imaging, microbiology, ancillary and laboratory) are listed below for reference.   Imaging Studies: CT Angio Chest PE W and/or Wo Contrast  Result Date: 05/08/2022 CLINICAL DATA:  21 year old male with history of sickle cell disease. Pleuritic chest pain and hypoxia. Evaluate for pulmonary embolism versus acute chest syndrome. EXAM: CT ANGIOGRAPHY CHEST WITH  CONTRAST TECHNIQUE: Multidetector CT imaging of the chest was performed using the standard protocol during bolus administration of intravenous contrast. Multiplanar CT image reconstructions and MIPs were obtained to evaluate the vascular anatomy. RADIATION DOSE REDUCTION: This exam was performed according to the departmental dose-optimization program which includes automated exposure control, adjustment of the mA and/or kV according to  patient size and/or use of iterative reconstruction technique. CONTRAST:  OMNIPAQUE IOHEXOL 350 MG/ML SOLN COMPARISON:  Chest CT 03/28/2022. FINDINGS: Cardiovascular: No filling defects in the pulmonary arterial tree to suggest pulmonary embolism. Heart size is mildly enlarged. There is no significant pericardial fluid, thickening or pericardial calcification. No atherosclerotic calcifications in the thoracic aorta or the coronary arteries. Left vertebral artery arises directly from the aortic arch (normal anatomical variant) incidentally noted. Mediastinum/Nodes: No pathologically enlarged mediastinal or hilar lymph nodes. Esophagus is unremarkable in appearance. No axillary lymphadenopathy. Lungs/Pleura: Patchy areas of peribronchovascular ground-glass attenuation, thickening of the peribronchovascular interstitium and regional architectural distortion and volume loss are noted in the lower lobes of the lungs bilaterally, increased compared to the recent prior study. Trace right pleural effusion lying dependently. No definite left pleural effusion. Mid to upper lungs are otherwise clear. Upper Abdomen: Spleen is severely atrophic and densely calcified. Musculoskeletal: Diffuse sclerosis throughout the visualized axial and appendicular skeleton, sequela of sickle cell disease. H-shaped vertebral bodies again noted. No aggressive appearing lytic or blastic lesions are otherwise noted in the visualized portions of the skeleton. Review of the MIP images confirms the above findings. IMPRESSION: 1. No evidence of pulmonary embolism. 2. Worsening areas of airspace consolidation in the lung bases, superimposed upon areas of chronic scarring, in addition to new trace right pleural effusion lying dependently. Imaging findings are concerning for acute chest syndrome. 3. Chronic imaging stigmata of sickle cell disease redemonstrated. Electronically Signed   By: Trudie Reed M.D.   On: 05/08/2022 05:59   DG Chest 2  View  Result Date: 05/08/2022 CLINICAL DATA:  21 year old male with sickle cell disease. Pleuritic chest pain. Hypoxia. EXAM: CHEST - 2 VIEW COMPARISON:  CTA chest 03/28/2022 and earlier. FINDINGS: PA and lateral views at 0406 hours. Stable mild-to-moderate cardiomegaly. Other mediastinal contours are within normal limits. Stable lung volumes with unchanged mild elevation of the right hemidiaphragm, normal variant. No pneumothorax, pulmonary edema, pleural effusion, or confluent pulmonary opacity. Visualized tracheal air column is within normal limits. H-shaped vertebral endplate deformities of sickle cell disease. No acute osseous abnormality identified. Negative visible bowel gas. IMPRESSION: Sequelae of sickle cell disease including cardiomegaly. No acute cardiopulmonary abnormality. Electronically Signed   By: Odessa Fleming M.D.   On: 05/08/2022 04:04    Microbiology: Results for orders placed or performed in visit on 04/16/22  Urine Culture     Status: None   Collection Time: 04/16/22  2:32 PM   Specimen: Urine   UR  Result Value Ref Range Status   Urine Culture, Routine Final report  Final   Organism ID, Bacteria Comment  Final    Comment: Culture shows less than 10,000 colony forming units of bacteria per milliliter of urine. This colony count is not generally considered to be clinically significant.   GC/Chlamydia Probe Amp([facility])     Status: None   Collection Time: 04/16/22  2:42 PM   Specimen: Urine   UR  Result Value Ref Range Status   Chlamydia trachomatis, NAA Negative Negative Final   Neisseria Gonorrhoeae by PCR Negative Negative Final    Labs: CBC: Recent Labs  Lab 05/08/22 0325 05/08/22 2111 05/09/22 0612 05/10/22 1024  WBC 13.6* 11.7* 11.1* 11.0*  NEUTROABS 7.4  --  6.0  --   HGB 9.2* 9.5* 9.0* 8.5*  HCT 24.9* 26.5* 25.4* 22.7*  MCV 81.4 82.8 83.0 81.1  PLT 332 334 308 321   Basic Metabolic Panel: Recent Labs  Lab 05/08/22 0325 05/08/22 2111 05/09/22 0612   NA 136  --  137  K 4.3  --  4.1  CL 103  --  102  CO2 26  --  29  GLUCOSE 97  --  105*  BUN 13  --  7  CREATININE 0.75 0.52* 0.59*  CALCIUM 9.0  --  8.9   Liver Function Tests: Recent Labs  Lab 05/08/22 0325 05/09/22 0612  AST 41 29  ALT 19 14  ALKPHOS 89 67  BILITOT 3.6* 3.1*  PROT 7.3 7.0  ALBUMIN 4.1 3.9   CBG: No results for input(s): "GLUCAP" in the last 168 hours.  Discharge time spent: greater than 30 minutes.  SignedLonia Blood, MD [facility] 05/12/2022

## 2022-05-13 ENCOUNTER — Telehealth: Payer: Self-pay | Admitting: *Deleted

## 2022-05-13 ENCOUNTER — Telehealth: Payer: Self-pay | Admitting: Family Medicine

## 2022-05-13 NOTE — Telephone Encounter (Signed)
Requesting note for work

## 2022-05-13 NOTE — Telephone Encounter (Signed)
Transition Care Management Unsuccessful Follow-up Telephone Call  Date of discharge and from where:  05/12/22 [facility]  Attempts:  1st Attempt  Reason for unsuccessful TCM follow-up call:  Left voice message  Cranford Mon RN, CCM, CDCES [facility]  [facility] Care Management Coordinator  [phone]

## 2022-05-14 ENCOUNTER — Telehealth: Payer: Self-pay | Admitting: *Deleted

## 2022-05-14 ENCOUNTER — Encounter: Payer: Self-pay | Admitting: Family Medicine

## 2022-05-14 NOTE — Telephone Encounter (Signed)
Transition Care Management Unsuccessful Follow-up Telephone Call  Date of discharge and from where:  05/12/22 [facility]  Attempts:  2nd Attempt  Reason for unsuccessful TCM follow-up call:  Left voice message  Cranford Mon RN, CCM, CDCES [facility]  [facility] Care Management Coordinator  [phone]

## 2022-05-18 ENCOUNTER — Encounter (HOSPITAL_BASED_OUTPATIENT_CLINIC_OR_DEPARTMENT_OTHER): Payer: Self-pay | Admitting: Emergency Medicine

## 2022-05-18 ENCOUNTER — Other Ambulatory Visit: Payer: Self-pay

## 2022-05-18 ENCOUNTER — Emergency Department (HOSPITAL_BASED_OUTPATIENT_CLINIC_OR_DEPARTMENT_OTHER)
Admission: EM | Admit: 2022-05-18 | Discharge: 2022-05-18 | Disposition: A | Discharge status: Home / Self Care | Payer: Medicaid Other | Attending: Emergency Medicine | Admitting: Emergency Medicine

## 2022-05-18 ENCOUNTER — Emergency Department (HOSPITAL_BASED_OUTPATIENT_CLINIC_OR_DEPARTMENT_OTHER): Payer: Medicaid Other

## 2022-05-18 DIAGNOSIS — D5700 Hb-SS disease with crisis, unspecified: Secondary | ICD-10-CM | POA: Diagnosis present

## 2022-05-18 LAB — CBC WITH DIFFERENTIAL/PLATELET
Abs Immature Granulocytes: 0.51 10*3/uL — ABNORMAL HIGH (ref 0.00–0.07)
Basophils Absolute: 0.2 10*3/uL — ABNORMAL HIGH (ref 0.0–0.1)
Basophils Relative: 2 %
Eosinophils Absolute: 0.2 10*3/uL (ref 0.0–0.5)
Eosinophils Relative: 2 %
HCT: 22.8 % — ABNORMAL LOW (ref 39.0–52.0)
Hemoglobin: 8.2 g/dL — ABNORMAL LOW (ref 13.0–17.0)
Immature Granulocytes: 4 %
Lymphocytes Relative: 36 %
Lymphs Abs: 4.2 10*3/uL — ABNORMAL HIGH (ref 0.7–4.0)
MCH: 29.9 pg (ref 26.0–34.0)
MCHC: 36 g/dL (ref 30.0–36.0)
MCV: 83.2 fL (ref 80.0–100.0)
Monocytes Absolute: 1.5 10*3/uL — ABNORMAL HIGH (ref 0.1–1.0)
Monocytes Relative: 13 %
Neutro Abs: 5 10*3/uL (ref 1.7–7.7)
Neutrophils Relative %: 43 %
Platelets: 585 10*3/uL — ABNORMAL HIGH (ref 150–400)
RBC: 2.74 MIL/uL — ABNORMAL LOW (ref 4.22–5.81)
RDW: 21.6 % — ABNORMAL HIGH (ref 11.5–15.5)
Smear Review: NORMAL
WBC: 11.6 10*3/uL — ABNORMAL HIGH (ref 4.0–10.5)
nRBC: 1.1 % — ABNORMAL HIGH (ref 0.0–0.2)

## 2022-05-18 LAB — BASIC METABOLIC PANEL
Anion gap: 6 (ref 5–15)
BUN: 9 mg/dL (ref 6–20)
CO2: 23 mmol/L (ref 22–32)
Calcium: 8.8 mg/dL — ABNORMAL LOW (ref 8.9–10.3)
Chloride: 107 mmol/L (ref 98–111)
Creatinine, Ser: 0.62 mg/dL (ref 0.61–1.24)
GFR, Estimated: >60 mL/min (ref >60)
Glucose, Bld: 97 mg/dL (ref 70–99)
Potassium: 4.2 mmol/L (ref 3.5–5.1)
Sodium: 136 mmol/L (ref 135–145)

## 2022-05-18 LAB — RETICULOCYTES
Immature Retic Fract: 46.3 % — ABNORMAL HIGH (ref 2.3–15.9)
RBC.: 2.72 MIL/uL — ABNORMAL LOW (ref 4.22–5.81)
Retic Count, Absolute: 275.5 10*3/uL — ABNORMAL HIGH (ref 19.0–186.0)
Retic Ct Pct: 10.1 % — ABNORMAL HIGH (ref 0.4–3.1)

## 2022-05-18 LAB — PROTIME-INR
INR: 1.3 — ABNORMAL HIGH (ref 0.8–1.2)
Prothrombin Time: 15.8 seconds — ABNORMAL HIGH (ref 11.4–15.2)

## 2022-05-18 LAB — APTT: aPTT: 34 seconds (ref 24–36)

## 2022-05-18 MED ORDER — DIPHENHYDRAMINE HCL 50 MG/ML IJ SOLN
25.0000 mg | Freq: Once | INTRAMUSCULAR | Status: AC
  Administered 2022-05-18: 25 mg via INTRAVENOUS
  Filled 2022-05-18: qty 1

## 2022-05-18 MED ORDER — SODIUM CHLORIDE 0.45 % IV SOLN: INTRAVENOUS | Status: DC

## 2022-05-18 MED ORDER — HYDROMORPHONE HCL 1 MG/ML IJ SOLN
1.0000 mg | Freq: Once | INTRAMUSCULAR | Status: AC
  Administered 2022-05-18: 1 mg via INTRAVENOUS
  Filled 2022-05-18: qty 1

## 2022-05-18 NOTE — ED Provider Notes (Signed)
[facility] EMERGENCY DEPARTMENT Provider Note   CSN: [id number] Arrival date & time: 05/18/22  1343     History  Chief Complaint  Patient presents with   Sickle Cell Pain Crisis    [name] Hill is a 21 y.o. male.  Patient is a 21 year old male with past medical history of sickle cell disease presenting for complaints pain.  Patient admits to chest pain, jaw pain, and back pain.  Pain began at 0900 this morning.  Denies any shortness of breath or difficulty breathing.  States he took his oxycodone 10 mg with minimal improvement of pain.  Chart review demonstrates multiple ED presentations this year for pain with several admissions for sickle cell crisis.  The history is provided by the patient. No language interpreter was used.  Sickle Cell Pain Crisis Associated symptoms: chest pain   Associated symptoms: no cough, no fever, no shortness of breath, no sore throat and no vomiting        Home Medications Prior to Admission medications   Medication Sig Start Date End Date Taking? Authorizing Provider  acetaminophen (TYLENOL) 500 MG tablet Take 500 mg by mouth daily as needed for mild pain.    [provider]  folic acid (FOLVITE) 1 MG tablet Take 1 tablet (1 mg total) by mouth daily. 04/10/22 04/10/23  Massie Maroon, FNP  ibuprofen (ADVIL) 800 MG tablet Take 1 tablet (800 mg total) by mouth every 8 (eight) hours as needed. Patient taking differently: Take 800 mg by mouth every 8 (eight) hours as needed for moderate pain. 01/10/22   Massie Maroon, FNP  Oxycodone HCl 10 MG TABS Take 1 tablet (10 mg total) by mouth every 4 (four) hours as needed (pain). 05/12/22   Rometta Emery, MD      Allergies    Lisinopril, Pork-derived products, and Toradol [ketorolac tromethamine]    Review of Systems   Review of Systems  Constitutional:  Negative for chills and fever.  HENT:  Negative for ear pain and sore throat.   Eyes:  Negative for pain and visual  disturbance.  Respiratory:  Negative for cough and shortness of breath.   Cardiovascular:  Positive for chest pain. Negative for palpitations.  Gastrointestinal:  Negative for abdominal pain and vomiting.  Genitourinary:  Negative for dysuria and hematuria.  Musculoskeletal:  Positive for back pain. Negative for arthralgias.  Skin:  Negative for color change and rash.  Neurological:  Negative for seizures and syncope.  All other systems reviewed and are negative.   Physical Exam Updated Vital Signs BP 109/67   Pulse 81   Temp 98.2 F (36.8 C) (Oral)   Resp 18   SpO2 93%  Physical Exam Vitals and nursing note reviewed.  Constitutional:      General: He is not in acute distress.    Appearance: He is well-developed.  HENT:     Head: Normocephalic and atraumatic.  Eyes:     Conjunctiva/sclera: Conjunctivae normal.  Cardiovascular:     Rate and Rhythm: Normal rate and regular rhythm.     Heart sounds: No murmur heard. Pulmonary:     Effort: Pulmonary effort is normal. No respiratory distress.     Breath sounds: Normal breath sounds.  Abdominal:     Palpations: Abdomen is soft.     Tenderness: There is no abdominal tenderness.  Musculoskeletal:        General: No swelling.     Cervical back: Neck supple.  Skin:  General: Skin is warm and dry.     Capillary Refill: Capillary refill takes less than 2 seconds.  Neurological:     Mental Status: He is alert.  Psychiatric:        Mood and Affect: Mood normal.     ED Results / Procedures / Treatments   Labs (all labs ordered are listed, but only abnormal results are displayed) Labs Reviewed  CBC WITH DIFFERENTIAL/PLATELET - Abnormal; Notable for the following components:      Result Value   WBC 11.6 (*)    RBC 2.74 (*)    Hemoglobin 8.2 (*)    HCT 22.8 (*)    RDW 21.6 (*)    Platelets 585 (*)    nRBC 1.1 (*)    Lymphs Abs 4.2 (*)    Monocytes Absolute 1.5 (*)    Basophils Absolute 0.2 (*)    Abs Immature  Granulocytes 0.51 (*)    All other components within normal limits  BASIC METABOLIC PANEL - Abnormal; Notable for the following components:   Calcium 8.8 (*)    All other components within normal limits  PROTIME-INR - Abnormal; Notable for the following components:   Prothrombin Time 15.8 (*)    INR 1.3 (*)    All other components within normal limits  RETICULOCYTES - Abnormal; Notable for the following components:   Retic Ct Pct 10.1 (*)    RBC. 2.72 (*)    Retic Count, Absolute 275.5 (*)    Immature Retic Fract 46.3 (*)    All other components within normal limits  APTT    EKG EKG Interpretation  Date/Time:  Saturday May 18 2022 13:51:59 EDT Ventricular Rate:  83 PR Interval:  171 QRS Duration: 91 QT Interval:  362 QTC Calculation: 426 R Axis:   88 Text Interpretation: Sinus rhythm LVH by voltage Borderline ST elevation, lateral leads No significant change since last tracing Confirmed by Richardean Canal [phone]) on 05/18/2022 3:13:30 PM  Radiology No results found.  Procedures Procedures    Medications Ordered in ED Medications  HYDROmorphone (DILAUDID) injection 1 mg (1 mg Intravenous Given 05/18/22 1439)  diphenhydrAMINE (BENADRYL) injection 25 mg (25 mg Intravenous Given 05/18/22 1617)  HYDROmorphone (DILAUDID) injection 1 mg (1 mg Intravenous Given 05/18/22 1617)    ED Course/ Medical Decision Making/ A&P                           Medical Decision Making Amount and/or Complexity of Data Reviewed Labs: ordered. Radiology: ordered.  Risk Prescription drug management.   52:10 AM 21 year old male with past medical history of sickle cell disease presenting for complaints pain.  Is alert and oriented x3, no acute distress, afebrile, stable vital signs.  Physical exam demonstrates equal bilateral breath sounds with no adventitious lung sounds.  Stable ECG with no ST segment elevation or depression.  Stable intervals.  Normal axis.  Rate of 83 bpm.  Half-normal  saline started and IV Dilaudid given for management of pain.    Patient signed out to oncoming physician awaiting laboratory work and pain control.         Final Clinical Impression(s) / ED Diagnoses Final diagnoses:  Sickle cell pain crisis [facility])    Rx / DC Orders ED Discharge Orders     None         Franne Forts, DO 05/23/22 1129

## 2022-05-18 NOTE — ED Notes (Signed)
Attempted IV access to right hand and LAC, tol well, unsuccessful

## 2022-06-06 ENCOUNTER — Emergency Department (HOSPITAL_BASED_OUTPATIENT_CLINIC_OR_DEPARTMENT_OTHER): Payer: Medicaid Other

## 2022-06-06 ENCOUNTER — Other Ambulatory Visit: Payer: Self-pay

## 2022-06-06 ENCOUNTER — Encounter (HOSPITAL_BASED_OUTPATIENT_CLINIC_OR_DEPARTMENT_OTHER): Payer: Self-pay | Admitting: Emergency Medicine

## 2022-06-06 ENCOUNTER — Emergency Department (HOSPITAL_BASED_OUTPATIENT_CLINIC_OR_DEPARTMENT_OTHER)
Admission: EM | Admit: 2022-06-06 | Discharge: 2022-06-06 | Disposition: A | Discharge status: Home / Self Care | Payer: Medicaid Other | Attending: Emergency Medicine | Admitting: Emergency Medicine

## 2022-06-06 DIAGNOSIS — D57219 Sickle-cell/Hb-C disease with crisis, unspecified: Secondary | ICD-10-CM | POA: Insufficient documentation

## 2022-06-06 DIAGNOSIS — D5700 Hb-SS disease with crisis, unspecified: Secondary | ICD-10-CM

## 2022-06-06 LAB — RETICULOCYTES
Immature Retic Fract: 25.3 % — ABNORMAL HIGH (ref 2.3–15.9)
RBC.: 3.16 MIL/uL — ABNORMAL LOW (ref 4.22–5.81)
Retic Count, Absolute: 182.5 10*3/uL (ref 19.0–186.0)
Retic Ct Pct: 5.9 % — ABNORMAL HIGH (ref 0.4–3.1)

## 2022-06-06 LAB — BASIC METABOLIC PANEL
Anion gap: 8 (ref 5–15)
BUN: 7 mg/dL (ref 6–20)
CO2: 23 mmol/L (ref 22–32)
Calcium: 8.8 mg/dL — ABNORMAL LOW (ref 8.9–10.3)
Chloride: 107 mmol/L (ref 98–111)
Creatinine, Ser: 0.68 mg/dL (ref 0.61–1.24)
GFR, Estimated: >60 mL/min (ref >60)
Glucose, Bld: 113 mg/dL — ABNORMAL HIGH (ref 70–99)
Potassium: 3.7 mmol/L (ref 3.5–5.1)
Sodium: 138 mmol/L (ref 135–145)

## 2022-06-06 LAB — CBC WITH DIFFERENTIAL/PLATELET
Abs Immature Granulocytes: 0.06 10*3/uL (ref 0.00–0.07)
Basophils Absolute: 0.1 10*3/uL (ref 0.0–0.1)
Basophils Relative: 1 %
Eosinophils Absolute: 0.3 10*3/uL (ref 0.0–0.5)
Eosinophils Relative: 3 %
HCT: 25.9 % — ABNORMAL LOW (ref 39.0–52.0)
Hemoglobin: 9.5 g/dL — ABNORMAL LOW (ref 13.0–17.0)
Immature Granulocytes: 1 %
Lymphocytes Relative: 40 %
Lymphs Abs: 4 10*3/uL (ref 0.7–4.0)
MCH: 30.3 pg (ref 26.0–34.0)
MCHC: 36.7 g/dL — ABNORMAL HIGH (ref 30.0–36.0)
MCV: 82.5 fL (ref 80.0–100.0)
Monocytes Absolute: 0.9 10*3/uL (ref 0.1–1.0)
Monocytes Relative: 9 %
Neutro Abs: 4.7 10*3/uL (ref 1.7–7.7)
Neutrophils Relative %: 46 %
Platelets: 312 10*3/uL (ref 150–400)
RBC: 3.14 MIL/uL — ABNORMAL LOW (ref 4.22–5.81)
RDW: 20 % — ABNORMAL HIGH (ref 11.5–15.5)
WBC: 10 10*3/uL (ref 4.0–10.5)
nRBC: 0.8 % — ABNORMAL HIGH (ref 0.0–0.2)

## 2022-06-06 LAB — TROPONIN I (HIGH SENSITIVITY): Troponin I (High Sensitivity): 5 ng/L (ref <18)

## 2022-06-06 MED ORDER — HYDROMORPHONE HCL 1 MG/ML IJ SOLN
1.0000 mg | Freq: Once | INTRAMUSCULAR | Status: AC
  Administered 2022-06-06: 1 mg via INTRAVENOUS
  Filled 2022-06-06: qty 1

## 2022-06-06 MED ORDER — ONDANSETRON HCL 4 MG/2ML IJ SOLN
4.0000 mg | INTRAMUSCULAR | Status: DC | PRN
  Administered 2022-06-06: 4 mg via INTRAVENOUS
  Filled 2022-06-06: qty 2

## 2022-06-06 MED ORDER — SODIUM CHLORIDE 0.45 % IV SOLN: INTRAVENOUS | Status: DC

## 2022-06-06 MED ORDER — DIPHENHYDRAMINE HCL 50 MG/ML IJ SOLN
INTRAMUSCULAR | Status: AC
  Administered 2022-06-06: 50 mg
  Filled 2022-06-06: qty 1

## 2022-06-06 MED ORDER — SODIUM CHLORIDE 0.9 % IV SOLN
12.5000 mg | Freq: Once | INTRAVENOUS | Status: DC
  Filled 2022-06-06: qty 0.25

## 2022-06-06 NOTE — ED Triage Notes (Signed)
Patient presents to ED via POV. Here with sickle cell pain to back and chest. Reports he attempted to medicate at home with little relief.

## 2022-06-06 NOTE — ED Provider Notes (Signed)
[facility] EMERGENCY DEPARTMENT Provider Note   CSN: [id number] Arrival date & time: 06/06/22  1558     History  Chief Complaint  Patient presents with   Sickle Cell Pain Crisis    [name] Hill is a 21 y.o. male with past medical history significant for sickle cell disease who presents with concern for sickle cell crisis.  Patient reports that he thinks it is secondary to the heat, he endorses pain in the chest and back which is similar to his normal sickle cell crisis pain.  He has had multiple emergency department visits and admission for sickle cell pain this year.  Patient reports that he has tried 20 mg oxycodone x2 at home as well as ibuprofen with minimal relief of symptoms.  Patient denies any abdominal pain, dysuria, fever, chills, headache, vision changes.  Patient reports pain in chest and back with deep breathing but no significant isolated shortness of breath.   Sickle Cell Pain Crisis Associated symptoms: chest pain        Home Medications Prior to Admission medications   Medication Sig Start Date End Date Taking? Authorizing Provider  acetaminophen (TYLENOL) 500 MG tablet Take 500 mg by mouth daily as needed for mild pain.    [provider]  folic acid (FOLVITE) 1 MG tablet Take 1 tablet (1 mg total) by mouth daily. 04/10/22 04/10/23  Massie Maroon, FNP  ibuprofen (ADVIL) 800 MG tablet Take 1 tablet (800 mg total) by mouth every 8 (eight) hours as needed. Patient taking differently: Take 800 mg by mouth every 8 (eight) hours as needed for moderate pain. 01/10/22   Massie Maroon, FNP  Oxycodone HCl 10 MG TABS Take 1 tablet (10 mg total) by mouth every 4 (four) hours as needed (pain). 05/12/22   Rometta Emery, MD      Allergies    Lisinopril, Pork-derived products, and Toradol [ketorolac tromethamine]    Review of Systems   Review of Systems  Cardiovascular:  Positive for chest pain.  Musculoskeletal:  Positive for back pain.  All  other systems reviewed and are negative.   Physical Exam Updated Vital Signs BP 113/64   Pulse 80   Temp 99.1 F (37.3 C) (Oral)   Resp (!) 21   SpO2 92%  Physical Exam Vitals and nursing note reviewed.  Constitutional:      General: He is not in acute distress.    Appearance: Normal appearance.  HENT:     Head: Normocephalic and atraumatic.  Eyes:     General:        Right eye: No discharge.        Left eye: No discharge.  Cardiovascular:     Rate and Rhythm: Normal rate and regular rhythm.     Heart sounds: No murmur heard.    No friction rub. No gallop.     Comments: Some TTP of chest wall without stepoff or deformity Pulmonary:     Effort: Pulmonary effort is normal.     Breath sounds: Normal breath sounds.  Abdominal:     General: Bowel sounds are normal.     Palpations: Abdomen is soft.  Musculoskeletal:     Comments: Intact strength 5/5 of bilateral upper and lower extremities, no significant midline spinal TTP  Skin:    General: Skin is warm and dry.     Capillary Refill: Capillary refill takes less than 2 seconds.  Neurological:     Mental Status: He is alert  and oriented to person, place, and time.  Psychiatric:        Mood and Affect: Mood normal.        Behavior: Behavior normal.     ED Results / Procedures / Treatments   Labs (all labs ordered are listed, but only abnormal results are displayed) Labs Reviewed  CBC WITH DIFFERENTIAL/PLATELET - Abnormal; Notable for the following components:      Result Value   RBC 3.14 (*)    Hemoglobin 9.5 (*)    HCT 25.9 (*)    MCHC 36.7 (*)    RDW 20.0 (*)    nRBC 0.8 (*)    All other components within normal limits  BASIC METABOLIC PANEL - Abnormal; Notable for the following components:   Glucose, Bld 113 (*)    Calcium 8.8 (*)    All other components within normal limits  RETICULOCYTES  TROPONIN I (HIGH SENSITIVITY)    EKG None  Radiology DG Chest 2 View  Result Date: 06/06/2022 CLINICAL DATA:   Sickle cell crisis, chest and back pain EXAM: CHEST - 2 VIEW COMPARISON:  05/18/2022 FINDINGS: Mild cardiomegaly without CHF or pneumonia. No effusion or pneumothorax. Trachea midline. Chronic osseous changes of sickle cell disease. IMPRESSION: Stable cardiomegaly without acute chest process. Electronically Signed   By: Judie Petit.  Shick M.D.   On: 06/06/2022 16:26    Procedures Procedures    Medications Ordered in ED Medications  0.45 % sodium chloride infusion ( Intravenous New Bag/Given 06/06/22 1702)  diphenhydrAMINE (BENADRYL) 12.5 mg in sodium chloride 0.9 % 50 mL IVPB (12.5 mg Intravenous Not Given 06/06/22 1636)  ondansetron (ZOFRAN) injection 4 mg (4 mg Intravenous Given 06/06/22 1635)  HYDROmorphone (DILAUDID) injection 1 mg (1 mg Intravenous Given 06/06/22 1636)  diphenhydrAMINE (BENADRYL) 50 MG/ML injection (50 mg  Given 06/06/22 1635)  HYDROmorphone (DILAUDID) injection 1 mg (1 mg Intravenous Given 06/06/22 1728)  HYDROmorphone (DILAUDID) injection 1 mg (1 mg Intravenous Given 06/06/22 1805)    ED Course/ Medical Decision Making/ A&P                           Medical Decision Making Amount and/or Complexity of Data Reviewed Labs: ordered. Radiology: ordered.  Risk Prescription drug management.   This is a 21 year old male with past medical history significant for chronic pain syndrome, sickle cell disease with frequent emergency visits for pain control, sickle cell crisis, multiple admissions this year including for acute chest syndrome who presents with concern for chest pain, back pain that feels like a sickle cell crisis.  Patient reports he thinks that is exacerbated by the heat.  Patient reports he is taking 20 mg x 2 of at home oxycodone with no relief as well as at home ibuprofen.  He denies any significant shortness of breath other than some pain with deep inspiration secondary to chest and back pain.  He denies any fever, chills, nausea, vomiting at this time.  On my exam  patient in no acute distress, and endorsing some significant chest and back pain which is reproduced on palpation.  He is neurovascularly intact throughout, with no recent injuries, low clinical concern for acute spinal pathology, osteomyelitis, epidural abscess, compression fracture.  My differential diagnosis includes sickle cell pain crisis, acute chest syndrome, pneumonia, vaso-occlusive crisis, splenic infarct, versus other.  This is not an exhaustive differential.  Independently interpreted lab work and imaging.  On my independent review of plain film chest x-ray I  see stable cardiomegaly, no evidence of acute intrathoracic abnormality or acute chest syndrome.  His lab work is significant for stable to improved hemoglobin from recent check, no significant leukocytosis.  Troponin x1 in context of pain that has been present since last night is within normal limits at 5 today.  Do not think repeat troponin is necessary as patient does not seem to have ischemic or exertional chest pain.  BMP unremarkable at this time.  Reticulocytes pending at time of discharge but no reason to suspect acute aplastic crisis based on clinical presentation today.  My attending physician Dr. Wallace Cullens independently interpreted EKG which shows normal sinus rhythm with borderline prolonged PR.  No evidence of ischemia.  I agree with this interpretation.  Patient with some improvement of pain after initial Dilaudid, but still having fairly significant pain, he requests additional pain medication prior to discharge.  We will control with another dose of Dilaudid and reevaluate. After third dose of pain medication, patient is feeling stable to go home and use home medications. He is discharged in stable condition at this time. Final Clinical Impression(s) / ED Diagnoses Final diagnoses:  Sickle-cell disease with pain [facility])    Rx / DC Orders ED Discharge Orders     None         Olene Floss, PA-C 06/06/22 1914     Franne Forts, DO 06/07/22 0001

## 2022-06-07 ENCOUNTER — Telehealth: Payer: Self-pay

## 2022-06-07 NOTE — Telephone Encounter (Signed)
Oxycodone  °

## 2022-06-09 ENCOUNTER — Encounter (HOSPITAL_BASED_OUTPATIENT_CLINIC_OR_DEPARTMENT_OTHER): Payer: Self-pay | Admitting: Emergency Medicine

## 2022-06-09 ENCOUNTER — Emergency Department (HOSPITAL_BASED_OUTPATIENT_CLINIC_OR_DEPARTMENT_OTHER): Payer: Medicaid Other

## 2022-06-09 ENCOUNTER — Inpatient Hospital Stay (HOSPITAL_BASED_OUTPATIENT_CLINIC_OR_DEPARTMENT_OTHER)
Admission: EM | Admit: 2022-06-09 | Discharge: 2022-06-13 | DRG: 812 | Disposition: A | Discharge status: Home / Self Care | Payer: Medicaid Other | Attending: Internal Medicine | Admitting: Internal Medicine

## 2022-06-09 DIAGNOSIS — D638 Anemia in other chronic diseases classified elsewhere: Secondary | ICD-10-CM | POA: Diagnosis present

## 2022-06-09 DIAGNOSIS — J9611 Chronic respiratory failure with hypoxia: Secondary | ICD-10-CM | POA: Diagnosis present

## 2022-06-09 DIAGNOSIS — F1120 Opioid dependence, uncomplicated: Secondary | ICD-10-CM | POA: Diagnosis present

## 2022-06-09 DIAGNOSIS — G894 Chronic pain syndrome: Secondary | ICD-10-CM | POA: Diagnosis present

## 2022-06-09 DIAGNOSIS — E860 Dehydration: Secondary | ICD-10-CM | POA: Diagnosis present

## 2022-06-09 DIAGNOSIS — F1190 Opioid use, unspecified, uncomplicated: Secondary | ICD-10-CM

## 2022-06-09 DIAGNOSIS — Z8679 Personal history of other diseases of the circulatory system: Secondary | ICD-10-CM

## 2022-06-09 DIAGNOSIS — Z832 Family history of diseases of the blood and blood-forming organs and certain disorders involving the immune mechanism: Secondary | ICD-10-CM

## 2022-06-09 DIAGNOSIS — D5700 Hb-SS disease with crisis, unspecified: Principal | ICD-10-CM | POA: Diagnosis present

## 2022-06-09 DIAGNOSIS — Z8616 Personal history of COVID-19: Secondary | ICD-10-CM

## 2022-06-09 DIAGNOSIS — Z888 Allergy status to other drugs, medicaments and biological substances status: Secondary | ICD-10-CM

## 2022-06-09 DIAGNOSIS — R0789 Other chest pain: Secondary | ICD-10-CM

## 2022-06-09 DIAGNOSIS — Z885 Allergy status to narcotic agent status: Secondary | ICD-10-CM

## 2022-06-09 DIAGNOSIS — J4520 Mild intermittent asthma, uncomplicated: Secondary | ICD-10-CM | POA: Diagnosis present

## 2022-06-09 DIAGNOSIS — R1084 Generalized abdominal pain: Secondary | ICD-10-CM

## 2022-06-09 DIAGNOSIS — Z9981 Dependence on supplemental oxygen: Secondary | ICD-10-CM

## 2022-06-09 DIAGNOSIS — D5701 Hb-SS disease with acute chest syndrome: Secondary | ICD-10-CM

## 2022-06-09 DIAGNOSIS — Z8249 Family history of ischemic heart disease and other diseases of the circulatory system: Secondary | ICD-10-CM

## 2022-06-09 DIAGNOSIS — Z8349 Family history of other endocrine, nutritional and metabolic diseases: Secondary | ICD-10-CM

## 2022-06-09 DIAGNOSIS — D571 Sickle-cell disease without crisis: Secondary | ICD-10-CM

## 2022-06-09 LAB — CBC WITH DIFFERENTIAL/PLATELET
Abs Immature Granulocytes: 0.04 10*3/uL (ref 0.00–0.07)
Basophils Absolute: 0.1 10*3/uL (ref 0.0–0.1)
Basophils Relative: 1 %
Eosinophils Absolute: 0.1 10*3/uL (ref 0.0–0.5)
Eosinophils Relative: 1 %
HCT: 24.2 % — ABNORMAL LOW (ref 39.0–52.0)
Hemoglobin: 8.9 g/dL — ABNORMAL LOW (ref 13.0–17.0)
Immature Granulocytes: 0 %
Lymphocytes Relative: 24 %
Lymphs Abs: 2.8 10*3/uL (ref 0.7–4.0)
MCH: 30.3 pg (ref 26.0–34.0)
MCHC: 36.8 g/dL — ABNORMAL HIGH (ref 30.0–36.0)
MCV: 82.3 fL (ref 80.0–100.0)
Monocytes Absolute: 1.1 10*3/uL — ABNORMAL HIGH (ref 0.1–1.0)
Monocytes Relative: 10 %
Neutro Abs: 7.6 10*3/uL (ref 1.7–7.7)
Neutrophils Relative %: 64 %
Platelets: 332 10*3/uL (ref 150–400)
RBC: 2.94 MIL/uL — ABNORMAL LOW (ref 4.22–5.81)
RDW: 19.4 % — ABNORMAL HIGH (ref 11.5–15.5)
WBC: 11.7 10*3/uL — ABNORMAL HIGH (ref 4.0–10.5)
nRBC: 0.3 % — ABNORMAL HIGH (ref 0.0–0.2)

## 2022-06-09 LAB — RETICULOCYTES
Immature Retic Fract: 27.7 % — ABNORMAL HIGH (ref 2.3–15.9)
RBC.: 2.99 MIL/uL — ABNORMAL LOW (ref 4.22–5.81)
Retic Count, Absolute: 139 10*3/uL (ref 19.0–186.0)
Retic Ct Pct: 4.7 % — ABNORMAL HIGH (ref 0.4–3.1)

## 2022-06-09 LAB — COMPREHENSIVE METABOLIC PANEL
ALT: 19 U/L (ref 0–44)
AST: 37 U/L (ref 15–41)
Albumin: 4 g/dL (ref 3.5–5.0)
Alkaline Phosphatase: 75 U/L (ref 38–126)
Anion gap: 6 (ref 5–15)
BUN: 7 mg/dL (ref 6–20)
CO2: 25 mmol/L (ref 22–32)
Calcium: 8.9 mg/dL (ref 8.9–10.3)
Chloride: 107 mmol/L (ref 98–111)
Creatinine, Ser: 0.65 mg/dL (ref 0.61–1.24)
GFR, Estimated: >60 mL/min (ref >60)
Glucose, Bld: 99 mg/dL (ref 70–99)
Potassium: 3.7 mmol/L (ref 3.5–5.1)
Sodium: 138 mmol/L (ref 135–145)
Total Bilirubin: 4.1 mg/dL — ABNORMAL HIGH (ref 0.3–1.2)
Total Protein: 7.3 g/dL (ref 6.5–8.1)

## 2022-06-09 MED ORDER — HYDROMORPHONE HCL 1 MG/ML IJ SOLN
2.0000 mg | INTRAMUSCULAR | Status: AC
  Administered 2022-06-10: 2 mg via INTRAVENOUS

## 2022-06-09 MED ORDER — DIPHENHYDRAMINE HCL 50 MG/ML IJ SOLN
INTRAMUSCULAR | Status: AC
  Filled 2022-06-09: qty 1

## 2022-06-09 MED ORDER — IOHEXOL 350 MG/ML SOLN
75.0000 mL | Freq: Once | INTRAVENOUS | Status: AC | PRN
  Administered 2022-06-09: 75 mL via INTRAVENOUS

## 2022-06-09 MED ORDER — HYDROMORPHONE HCL 1 MG/ML IJ SOLN
2.0000 mg | INTRAMUSCULAR | Status: AC
  Administered 2022-06-09: 2 mg via INTRAVENOUS
  Filled 2022-06-09: qty 2

## 2022-06-09 MED ORDER — DEXTROSE-NACL 5-0.45 % IV SOLN: INTRAVENOUS | Status: DC

## 2022-06-09 MED ORDER — ONDANSETRON HCL 4 MG/2ML IJ SOLN: 4.0000 mg | INTRAMUSCULAR | Status: DC | PRN

## 2022-06-09 MED ORDER — SODIUM CHLORIDE 0.9 % IV SOLN: INTRAVENOUS | Status: DC | PRN

## 2022-06-09 MED ORDER — OXYCODONE HCL 5 MG PO TABS
5.0000 mg | ORAL_TABLET | ORAL | Status: DC | PRN
  Administered 2022-06-10: 5 mg via ORAL
  Filled 2022-06-09: qty 1

## 2022-06-09 MED ORDER — SODIUM CHLORIDE 0.9 % IV SOLN
12.5000 mg | Freq: Once | INTRAVENOUS | Status: AC
  Administered 2022-06-09: 12.5 mg via INTRAVENOUS
  Filled 2022-06-09: qty 0.25

## 2022-06-09 MED ORDER — HYDROMORPHONE HCL 1 MG/ML IJ SOLN: 2.0000 mg | INTRAMUSCULAR | Status: AC

## 2022-06-09 NOTE — ED Notes (Signed)
Decreased oxygen to 2L [place]. Patient oxygen saturation 99% and comfortable. Will continue to monitor and make adjustments.

## 2022-06-09 NOTE — ED Notes (Signed)
Pt to Radiology for CXR on 4L [place]. Will establish IV access on his return to room.

## 2022-06-09 NOTE — ED Provider Notes (Addendum)
[facility] EMERGENCY DEPARTMENT Provider Note   CSN: [id number] Arrival date & time: 06/09/22  1849     History  Chief Complaint  Patient presents with   Sickle Cell Pain Crisis    [name] Hill is a 21 y.o. male.  Patient presenting with what he describes as his usual sickle cell pain.  Anterior chest pain and also pain to the lower back and upper back.  Now just pain in the upper back.  Patient was seen on Friday,by 13th for same went through sickle cell protocol had improvement.  Patient states he was a little bit better but pain came back right away.  Patient here has had an oxygen requirement which is not normal for him.  His oxygen sats were below 90% on room air he was put on 4 L of oxygen and now has been tapered to 2 L of oxygen oxygen sats are good.  Patient states he has been taking his home medications without any benefit.  Patient denies fever denies pain to upper extremities lower extremities.  Patient last had CT angio chest in June.  She is followed by the sickle cell clinic.  Patient does state he has had shortness of breath now with the chest pain and the back pain for 4 days.  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  Sickle cell neuropathy history of pneumonias.  History of hypertension.  Blood pressure is good here today blood pressure is 135/82.  Temp was 99.5 on arrival.  Patient states he has had some Tylenol.  So is possible he could be febrile.       Home Medications Prior to Admission medications   Medication Sig Start Date End Date Taking? Authorizing Provider  acetaminophen (TYLENOL) 500 MG tablet Take 500 mg by mouth daily as needed for mild pain.    [provider]  folic acid (FOLVITE) 1 MG tablet Take 1 tablet (1 mg total) by mouth daily. 04/10/22 04/10/23  Massie Maroon, FNP  ibuprofen (ADVIL) 800 MG tablet Take 1 tablet (800 mg total) by mouth  every 8 (eight) hours as needed. Patient taking differently: Take 800 mg by mouth every 8 (eight) hours as needed for moderate pain. 01/10/22   Massie Maroon, FNP  Oxycodone HCl 10 MG TABS Take 1 tablet (10 mg total) by mouth every 4 (four) hours as needed (pain). 05/12/22   Rometta Emery, MD      Allergies    Lisinopril, Pork-derived products, and Toradol [ketorolac tromethamine]    Review of Systems   Review of Systems  Constitutional:  Negative for chills and fever.  HENT:  Negative for ear pain and sore throat.   Eyes:  Negative for pain and visual disturbance.  Respiratory:  Positive for shortness of breath. Negative for cough.   Cardiovascular:  Positive for chest pain. Negative for palpitations.  Gastrointestinal:  Negative for abdominal pain and vomiting.  Genitourinary:  Negative for dysuria and hematuria.  Musculoskeletal:  Positive for back pain. Negative for arthralgias.  Skin:  Negative for color change and rash.  Neurological:  Negative for seizures and syncope.  All other systems reviewed and are negative.   Physical Exam Updated Vital Signs BP 137/79   Pulse 79   Temp 100.3 F (37.9 C) (Oral) Comment: pt states he had Tylenol right before he arrived  Resp (!) 35   Wt 90.7 kg  SpO2 99%   BMI 31.32 kg/m  Physical Exam Vitals and nursing note reviewed.  Constitutional:      General: He is not in acute distress.    Appearance: Normal appearance. He is well-developed. He is not ill-appearing.  HENT:     Head: Normocephalic and atraumatic.  Eyes:     Extraocular Movements: Extraocular movements intact.     Conjunctiva/sclera: Conjunctivae normal.     Pupils: Pupils are equal, round, and reactive to light.  Cardiovascular:     Rate and Rhythm: Normal rate and regular rhythm.     Heart sounds: No murmur heard. Pulmonary:     Effort: Pulmonary effort is normal. No respiratory distress.     Breath sounds: Normal breath sounds. No wheezing, rhonchi or  rales.  Abdominal:     Palpations: Abdomen is soft.     Tenderness: There is no abdominal tenderness.  Musculoskeletal:        General: No swelling.     Cervical back: Normal range of motion. No rigidity.  Skin:    General: Skin is warm and dry.     Capillary Refill: Capillary refill takes less than 2 seconds.  Neurological:     General: No focal deficit present.     Mental Status: He is alert and oriented to person, place, and time.     Cranial Nerves: No cranial nerve deficit.     Sensory: No sensory deficit.     Motor: No weakness.  Psychiatric:        Mood and Affect: Mood normal.     ED Results / Procedures / Treatments   Labs (all labs ordered are listed, but only abnormal results are displayed) Labs Reviewed  COMPREHENSIVE METABOLIC PANEL - Abnormal; Notable for the following components:      Result Value   Total Bilirubin 4.1 (*)    All other components within normal limits  CBC WITH DIFFERENTIAL/PLATELET - Abnormal; Notable for the following components:   WBC 11.7 (*)    RBC 2.94 (*)    Hemoglobin 8.9 (*)    HCT 24.2 (*)    MCHC 36.8 (*)    RDW 19.4 (*)    nRBC 0.3 (*)    Monocytes Absolute 1.1 (*)    All other components within normal limits  RETICULOCYTES    EKG EKG Interpretation  Date/Time:  Sunday June 09 2022 19:03:29 EDT Ventricular Rate:  83 PR Interval:  205 QRS Duration: 96 QT Interval:  344 QTC Calculation: 405 R Axis:   74 Text Interpretation: Sinus rhythm Borderline prolonged PR interval Confirmed by Vanetta Mulders [phone]) on 06/09/2022 7:11:45 PM  Radiology DG Chest 2 View  Result Date: 06/09/2022 CLINICAL DATA:  Sickle cell crisis, chest pain and dyspnea EXAM: CHEST - 2 VIEW COMPARISON:  06/06/2022 chest radiograph. FINDINGS: Stable cardiomediastinal silhouette with top-normal heart size. No pneumothorax. No pleural effusion. New streaky hazy right lung base opacity. No pulmonary edema. IMPRESSION: New streaky hazy right lung base  opacity, compatible with acute sickle cell crisis. Electronically Signed   By: Delbert Phenix M.D.   On: 06/09/2022 19:45    Procedures Procedures    Medications Ordered in ED Medications  dextrose 5 %-0.45 % sodium chloride infusion ( Intravenous New Bag/Given 06/09/22 2015)  diphenhydrAMINE (BENADRYL) 12.5 mg in sodium chloride 0.9 % 50 mL IVPB (has no administration in time range)  ondansetron (ZOFRAN) injection 4 mg (has no administration in time range)  HYDROmorphone (DILAUDID) injection 2 mg (has no  administration in time range)  HYDROmorphone (DILAUDID) injection 2 mg (has no administration in time range)  HYDROmorphone (DILAUDID) injection 2 mg (has no administration in time range)  diphenhydrAMINE (BENADRYL) 50 MG/ML injection (has no administration in time range)    ED Course/ Medical Decision Making/ A&P                           Medical Decision Making Amount and/or Complexity of Data Reviewed Labs: ordered. Radiology: ordered.  Risk Prescription drug management. Decision regarding hospitalization.   CRITICAL CARE Performed by: Vanetta Mulders Total critical care time: 45 minutes Critical care time was exclusive of separately billable procedures and treating other patients. Critical care was necessary to treat or prevent imminent or life-threatening deterioration. Critical care was time spent personally by me on the following activities: development of treatment plan with patient and/or surrogate as well as nursing, discussions with consultants, evaluation of patient's response to treatment, examination of patient, obtaining history from patient or surrogate, ordering and performing treatments and interventions, ordering and review of laboratory studies, ordering and review of radiographic studies, pulse oximetry and re-evaluation of patient's condition.  Patient will be started on sickle cell protocol.  But with the chest pain and the x-ray findings suspect patient has  acute chest syndrome secondary to sickle cell disease.  And also may even be febrile.  We will get CT angio because of the oxygen requirement.  Patient will ultimately require admission.  Patient's labs are still pending but CBC is back white count is 11.7 hemoglobin down to 8.9.  Take the cell count and a complete metabolic panel are pending.  Chest x-ray as mentioned showed new streaky hazy right lung base opacity compatible with acute sickle cell crisis.  Patient's reticulocytes are up.  CT angio chest shows no evidence of pulmonary embolus worsening groundglass opacities dependently in the lung bases concerning for acute chest syndrome.  Discussed with the hospitalist who will admit.  Final Clinical Impression(s) / ED Diagnoses Final diagnoses:  Sickle cell pain crisis [facility])  Other chest pain  Acute chest syndrome [facility])    Rx / DC Orders ED Discharge Orders     None         Vanetta Mulders, MD 06/09/22 2017    Vanetta Mulders, MD 06/09/22 2128

## 2022-06-09 NOTE — ED Notes (Signed)
Placed patient on 4L [place] due to desaturation. Patient oxygen saturations increased to 100%. Will continue to monitor and make changes as needed.Patient tolerating well.

## 2022-06-09 NOTE — ED Notes (Signed)
Ambulatory to and from restroom with steady gait.

## 2022-06-09 NOTE — ED Triage Notes (Signed)
Pt arrives pov, slow gait, c/o sickle cell crisis, endorses CP and shob x 4 days. Tx Friday for same

## 2022-06-10 ENCOUNTER — Other Ambulatory Visit: Payer: Self-pay

## 2022-06-10 DIAGNOSIS — Z8349 Family history of other endocrine, nutritional and metabolic diseases: Secondary | ICD-10-CM | POA: Diagnosis not present

## 2022-06-10 DIAGNOSIS — Z8616 Personal history of COVID-19: Secondary | ICD-10-CM | POA: Diagnosis not present

## 2022-06-10 DIAGNOSIS — Z8249 Family history of ischemic heart disease and other diseases of the circulatory system: Secondary | ICD-10-CM | POA: Diagnosis not present

## 2022-06-10 DIAGNOSIS — J4520 Mild intermittent asthma, uncomplicated: Secondary | ICD-10-CM | POA: Diagnosis present

## 2022-06-10 DIAGNOSIS — Z888 Allergy status to other drugs, medicaments and biological substances status: Secondary | ICD-10-CM | POA: Diagnosis not present

## 2022-06-10 DIAGNOSIS — J9611 Chronic respiratory failure with hypoxia: Secondary | ICD-10-CM | POA: Diagnosis present

## 2022-06-10 DIAGNOSIS — D5700 Hb-SS disease with crisis, unspecified: Secondary | ICD-10-CM | POA: Diagnosis present

## 2022-06-10 DIAGNOSIS — Z9981 Dependence on supplemental oxygen: Secondary | ICD-10-CM | POA: Diagnosis not present

## 2022-06-10 DIAGNOSIS — Z8679 Personal history of other diseases of the circulatory system: Secondary | ICD-10-CM | POA: Diagnosis not present

## 2022-06-10 DIAGNOSIS — G894 Chronic pain syndrome: Secondary | ICD-10-CM | POA: Diagnosis present

## 2022-06-10 DIAGNOSIS — D638 Anemia in other chronic diseases classified elsewhere: Secondary | ICD-10-CM | POA: Diagnosis present

## 2022-06-10 DIAGNOSIS — Z832 Family history of diseases of the blood and blood-forming organs and certain disorders involving the immune mechanism: Secondary | ICD-10-CM | POA: Diagnosis not present

## 2022-06-10 DIAGNOSIS — E860 Dehydration: Secondary | ICD-10-CM | POA: Diagnosis present

## 2022-06-10 DIAGNOSIS — F1120 Opioid dependence, uncomplicated: Secondary | ICD-10-CM | POA: Diagnosis present

## 2022-06-10 DIAGNOSIS — Z885 Allergy status to narcotic agent status: Secondary | ICD-10-CM | POA: Diagnosis not present

## 2022-06-10 LAB — CBC WITH DIFFERENTIAL/PLATELET
Abs Immature Granulocytes: 0.02 10*3/uL (ref 0.00–0.07)
Basophils Absolute: 0.1 10*3/uL (ref 0.0–0.1)
Basophils Relative: 1 %
Eosinophils Absolute: 0.2 10*3/uL (ref 0.0–0.5)
Eosinophils Relative: 2 %
HCT: 23.9 % — ABNORMAL LOW (ref 39.0–52.0)
Hemoglobin: 8.6 g/dL — ABNORMAL LOW (ref 13.0–17.0)
Immature Granulocytes: 0 %
Lymphocytes Relative: 31 %
Lymphs Abs: 3.4 10*3/uL (ref 0.7–4.0)
MCH: 30.1 pg (ref 26.0–34.0)
MCHC: 36 g/dL (ref 30.0–36.0)
MCV: 83.6 fL (ref 80.0–100.0)
Monocytes Absolute: 1.6 10*3/uL — ABNORMAL HIGH (ref 0.1–1.0)
Monocytes Relative: 15 %
Neutro Abs: 5.7 10*3/uL (ref 1.7–7.7)
Neutrophils Relative %: 51 %
Platelets: 302 10*3/uL (ref 150–400)
RBC: 2.86 MIL/uL — ABNORMAL LOW (ref 4.22–5.81)
RDW: 19.9 % — ABNORMAL HIGH (ref 11.5–15.5)
WBC: 11 10*3/uL — ABNORMAL HIGH (ref 4.0–10.5)
nRBC: 0.2 % (ref 0.0–0.2)

## 2022-06-10 LAB — TYPE AND SCREEN
ABO/RH(D): O POS
Antibody Screen: NEGATIVE

## 2022-06-10 LAB — MRSA NEXT GEN BY PCR, NASAL: MRSA by PCR Next Gen: NOT DETECTED

## 2022-06-10 LAB — LACTATE DEHYDROGENASE: LDH: 378 U/L — ABNORMAL HIGH (ref 98–192)

## 2022-06-10 MED ORDER — ONDANSETRON HCL 4 MG/2ML IJ SOLN: 4.0000 mg | Freq: Four times a day (QID) | INTRAMUSCULAR | Status: DC | PRN

## 2022-06-10 MED ORDER — CHLORHEXIDINE GLUCONATE CLOTH 2 % EX PADS
6.0000 | MEDICATED_PAD | Freq: Every day | CUTANEOUS | Status: DC
  Administered 2022-06-10 – 2022-06-13 (×4): 6 via TOPICAL

## 2022-06-10 MED ORDER — HYDROMORPHONE 1 MG/ML IV SOLN
INTRAVENOUS | Status: DC
  Administered 2022-06-10: 30 mg via INTRAVENOUS
  Administered 2022-06-10: 3.5 mg via INTRAVENOUS
  Administered 2022-06-11: 2 mg via INTRAVENOUS
  Administered 2022-06-11: 5 mg via INTRAVENOUS
  Administered 2022-06-11: 3.5 mg via INTRAVENOUS
  Administered 2022-06-11: 4.5 mg via INTRAVENOUS
  Administered 2022-06-11: 1.5 mg via INTRAVENOUS
  Administered 2022-06-11: 3.5 mg via INTRAVENOUS
  Administered 2022-06-12: 0.5 mg via INTRAVENOUS
  Administered 2022-06-12: 2.5 mg via INTRAVENOUS
  Administered 2022-06-12: 4 mg via INTRAVENOUS
  Filled 2022-06-10 (×2): qty 30

## 2022-06-10 MED ORDER — HYDROMORPHONE HCL 1 MG/ML IJ SOLN
INTRAMUSCULAR | Status: AC
  Filled 2022-06-10: qty 2

## 2022-06-10 MED ORDER — NALOXONE HCL 0.4 MG/ML IJ SOLN: 0.4000 mg | INTRAMUSCULAR | Status: DC | PRN

## 2022-06-10 MED ORDER — SODIUM CHLORIDE 0.9% FLUSH: 9.0000 mL | INTRAVENOUS | Status: DC | PRN

## 2022-06-10 MED ORDER — DIPHENHYDRAMINE HCL 25 MG PO CAPS: 25.0000 mg | ORAL_CAPSULE | ORAL | Status: DC | PRN

## 2022-06-10 MED ORDER — ORAL CARE MOUTH RINSE: 15.0000 mL | OROMUCOSAL | Status: DC | PRN

## 2022-06-10 NOTE — ED Notes (Signed)
Resting on stretcher. Even and non labored respirations noted. Eyes closed. Call light within reach.
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
## 2022-06-10 NOTE — ED Notes (Signed)
Report given to floor nurse. [facility] in to transfer

## 2022-06-10 NOTE — ED Notes (Signed)
Report given to [facility] 

## 2022-06-10 NOTE — ED Notes (Signed)
Sitting up in bed. Even and non labored respirations noted.

## 2022-06-11 DIAGNOSIS — D5700 Hb-SS disease with crisis, unspecified: Secondary | ICD-10-CM | POA: Diagnosis not present

## 2022-06-11 LAB — CBC
HCT: 24 % — ABNORMAL LOW (ref 39.0–52.0)
Hemoglobin: 8.7 g/dL — ABNORMAL LOW (ref 13.0–17.0)
MCH: 30.3 pg (ref 26.0–34.0)
MCHC: 36.3 g/dL — ABNORMAL HIGH (ref 30.0–36.0)
MCV: 83.6 fL (ref 80.0–100.0)
Platelets: 286 10*3/uL (ref 150–400)
RBC: 2.87 MIL/uL — ABNORMAL LOW (ref 4.22–5.81)
RDW: 19 % — ABNORMAL HIGH (ref 11.5–15.5)
WBC: 9.4 10*3/uL (ref 4.0–10.5)
nRBC: 0.3 % — ABNORMAL HIGH (ref 0.0–0.2)

## 2022-06-11 MED ORDER — FOLIC ACID 1 MG PO TABS
1.0000 mg | ORAL_TABLET | Freq: Every day | ORAL | Status: DC
  Administered 2022-06-11 – 2022-06-13 (×3): 1 mg via ORAL
  Filled 2022-06-11 (×3): qty 1

## 2022-06-11 NOTE — Progress Notes (Signed)
Subjective: [name] Hill is a 21 year old male with a medical history significant for sickle cell disease, chronic pain syndrome, opiate dependence and tolerance, history of anemia of chronic disease, and history of mild intermittent asthma was admitted for sickle cell pain crisis. Patient states that he feels somewhat better than yesterday.  He continues to have pain primarily to chest and low back.  He rates his pain as 5/10. He denies headache, shortness of breath, dizziness, urinary symptoms, nausea, vomiting, or diarrhea.  Objective:  Vital signs in last 24 hours:  Vitals:   06/11/22 0435 06/11/22 0802 06/11/22 1108 06/11/22 1220  BP:      Pulse:      Resp: 17 17 14    Temp:  98.2 F (36.8 C)  98.3 F (36.8 C)  TempSrc:  Oral  Oral  SpO2: 91% 92% 93%   Weight:      Height:        Intake/Output from previous day:   Intake/Output Summary (Last 24 hours) at 06/11/2022 1305 Last data filed at 06/11/2022 1013 Gross per 24 hour  Intake 490.49 ml  Output --  Net 490.49 ml    Physical Exam: General: Alert, awake, oriented x3, in no acute distress.  HEENT: [place]/AT PEERL, EOMI Neck: Trachea midline,  no masses, no thyromegal,y no JVD, no carotid bruit OROPHARYNX:  Moist, No exudate/ erythema/lesions.  Heart: Regular rate and rhythm, without murmurs, rubs, gallops, PMI non-displaced, no heaves or thrills on palpation.  Lungs: Clear to auscultation, no wheezing or rhonchi noted. No increased vocal fremitus resonant to percussion  Abdomen: Soft, nontender, nondistended, positive bowel sounds, no masses no hepatosplenomegaly noted..  Neuro: No focal neurological deficits noted cranial nerves II through XII grossly intact. DTRs 2+ bilaterally upper and lower extremities. Strength 5 out of 5 in bilateral upper and lower extremities. Musculoskeletal: No warm swelling or erythema around joints, no spinal tenderness noted. Psychiatric: Patient alert and oriented x3, good insight and  cognition, good recent to remote recall. Lymph node survey: No cervical axillary or inguinal lymphadenopathy noted.  Lab Results:  Basic Metabolic Panel:    Component Value Date/Time   NA 138 06/09/2022 1954   NA 137 04/16/2022 1413   K 3.7 06/09/2022 1954   CL 107 06/09/2022 1954   CO2 25 06/09/2022 1954   BUN 7 06/09/2022 1954   BUN 6 04/16/2022 1413   CREATININE 0.65 06/09/2022 1954   GLUCOSE 99 06/09/2022 1954   CALCIUM 8.9 06/09/2022 1954   CBC:    Component Value Date/Time   WBC 9.4 06/11/2022 0723   HGB 8.7 (L) 06/11/2022 0723   HGB 11.1 (L) 04/16/2022 1413   HCT 24.0 (L) 06/11/2022 0723   HCT 33.4 (L) 04/16/2022 1413   PLT 286 06/11/2022 0723   PLT 760 (H) 04/16/2022 1413   MCV 83.6 06/11/2022 0723   MCV 86 04/16/2022 1413   NEUTROABS 5.7 06/10/2022 1153   NEUTROABS 7.7 (H) 04/16/2022 1413   LYMPHSABS 3.4 06/10/2022 1153   LYMPHSABS 2.8 04/16/2022 1413   MONOABS 1.6 (H) 06/10/2022 1153   EOSABS 0.2 06/10/2022 1153   EOSABS 0.2 04/16/2022 1413   BASOSABS 0.1 06/10/2022 1153   BASOSABS 0.1 04/16/2022 1413    Recent Results (from the past 240 hour(s))  MRSA Next Gen by PCR, Nasal     Status: None   Collection Time: 06/10/22 10:33 AM   Specimen: Nasal Mucosa; Nasal Swab  Result Value Ref Range Status   MRSA by PCR Next  Gen NOT DETECTED NOT DETECTED Final    Comment: (NOTE) The GeneXpert MRSA Assay (FDA approved for NASAL specimens only), is one component of a comprehensive MRSA colonization surveillance program. It is not intended to diagnose MRSA infection nor to guide or monitor treatment for MRSA infections. Test performance is not FDA approved in patients less than 18 years old. Performed at [facility], [address], [place], [place] [address]     Studies/Results: CT Angio Chest PE W/Cm &/Or Wo Cm  Result Date: 06/09/2022 CLINICAL DATA:  Sickle cell crisis.  Suspected pulmonary embolus. EXAM: CT ANGIOGRAPHY CHEST WITH  CONTRAST TECHNIQUE: Multidetector CT imaging of the chest was performed using the standard protocol during bolus administration of intravenous contrast. Multiplanar CT image reconstructions and MIPs were obtained to evaluate the vascular anatomy. RADIATION DOSE REDUCTION: This exam was performed according to the departmental dose-optimization program which includes automated exposure control, adjustment of the mA and/or kV according to patient size and/or use of iterative reconstruction technique. CONTRAST:  34mL OMNIPAQUE IOHEXOL 350 MG/ML SOLN COMPARISON:  Chest x-ray June 09, 2022. CT pulmonary angiogram May 08, 2022. FINDINGS: Cardiovascular: The heart size is mildly enlarged. The thoracic aorta is normal in appearance. Evaluation for peripheral pulmonary emboli is limited due to respiratory motion. Within this limitation, no central emboli identified. Mediastinum/Nodes: There is a small right pleural effusion which is stable in the interval. No left pleural effusion or pericardial effusion. The thyroid and esophagus are normal. No adenopathy. Lungs/Pleura: Central airways are normal. Patchy ground-glass opacities remain dependently within the lungs worsened in the interval. No nodules or masses. No pneumothorax. Upper Abdomen: No acute abnormality. The spleen remains severely atrophic. Musculoskeletal: Bony sclerosis and vertebral body changes consistent with known sickle cell. Review of the MIP images confirms the above findings. IMPRESSION: 1. No evidence of pulmonary emboli. 2. Worsening ground-glass opacities dependently in the lung bases are concerning for acute chest syndrome given history. 3. Persistent small right pleural effusion. 4. No other acute abnormalities. Electronically Signed   By: Gerome Sam III M.D.   On: 06/09/2022 21:01   DG Chest 2 View  Result Date: 06/09/2022 CLINICAL DATA:  Sickle cell crisis, chest pain and dyspnea EXAM: CHEST - 2 VIEW COMPARISON:  06/06/2022 chest  radiograph. FINDINGS: Stable cardiomediastinal silhouette with top-normal heart size. No pneumothorax. No pleural effusion. New streaky hazy right lung base opacity. No pulmonary edema. IMPRESSION: New streaky hazy right lung base opacity, compatible with acute sickle cell crisis. Electronically Signed   By: Delbert Phenix M.D.   On: 06/09/2022 19:45    Medications: Scheduled Meds:  Chlorhexidine Gluconate Cloth  6 each Topical Daily   HYDROmorphone   Intravenous Q4H   Continuous Infusions:  sodium chloride 10 mL/hr at 06/11/22 1107   dextrose 5 % and 0.45% NaCl 50 mL/hr at 06/11/22 0802   PRN Meds:.sodium chloride, diphenhydrAMINE, naloxone **AND** sodium chloride flush, ondansetron, ondansetron (ZOFRAN) IV, mouth rinse, oxyCODONE  Consultants: None  Procedures: None  Antibiotics: None  Assessment/Plan: Principal Problem:   Sickle cell pain crisis (HCC) Active Problems:   Anemia of chronic disease   Chronic pain syndrome  Sickle cell disease with pain crisis: Continue IV Dilaudid PCA Toradol 15 mg every 6 hours Oxycodone 10 mg every 4 hours as needed for severe breakthrough pain Decrease IV fluids to [facility] Monitor vital signs closely, reevaluate pain scale regularly, and supplemental oxygen as needed.  Chronic pain syndrome: Continue home medications  Chronic respiratory failure with  hypoxia: Patient is on home oxygen at 2 L, will continue.  Anemia of chronic disease: Hemoglobin is stable and consistent with patient's baseline.  There is no clinical indication for blood transfusion at this time.  Follow labs in AM.  Code Status: Full Code Family Communication: N/A Disposition Plan: Not yet ready for discharge  Nolon Nations  APRN, MSN, FNP-C [facility] [address] [place], [place] [address] [phone]  If 7PM-7AM, please contact night-coverage.  06/11/2022, 1:05 PM  LOS: 1 day

## 2022-06-11 NOTE — Progress Notes (Signed)
Transition of Care [facility]) Screening Note  Patient Details  Name: [name] Hill Date of Birth: 2001-11-17  Transition of Care [facility]) CM/SW Contact:    Ewing Schlein, LCSW Phone Number: 06/11/2022, 10:51 AM  Transition of Care Department [facility]) has reviewed patient and no TOC needs have been identified at this time. We will continue to monitor patient advancement through interdisciplinary progression rounds. If new patient transition needs arise, please place a TOC consult.

## 2022-06-12 DIAGNOSIS — D5700 Hb-SS disease with crisis, unspecified: Secondary | ICD-10-CM | POA: Diagnosis not present

## 2022-06-12 LAB — CBC
HCT: 22.5 % — ABNORMAL LOW (ref 39.0–52.0)
Hemoglobin: 8.2 g/dL — ABNORMAL LOW (ref 13.0–17.0)
MCH: 30.1 pg (ref 26.0–34.0)
MCHC: 36.4 g/dL — ABNORMAL HIGH (ref 30.0–36.0)
MCV: 82.7 fL (ref 80.0–100.0)
Platelets: 305 10*3/uL (ref 150–400)
RBC: 2.72 MIL/uL — ABNORMAL LOW (ref 4.22–5.81)
RDW: 18.7 % — ABNORMAL HIGH (ref 11.5–15.5)
WBC: 9.6 10*3/uL (ref 4.0–10.5)
nRBC: 0.3 % — ABNORMAL HIGH (ref 0.0–0.2)

## 2022-06-12 MED ORDER — OXYCODONE HCL 5 MG PO TABS: 10.0000 mg | ORAL_TABLET | ORAL | Status: DC | PRN

## 2022-06-12 MED ORDER — HYDROMORPHONE 1 MG/ML IV SOLN
INTRAVENOUS | Status: DC
  Administered 2022-06-12: 2 mg via INTRAVENOUS
  Administered 2022-06-12: 2.5 mg via INTRAVENOUS
  Administered 2022-06-13: 1.5 mg via INTRAVENOUS
  Administered 2022-06-13: 5.5 mg via INTRAVENOUS
  Administered 2022-06-13: 3 mg via INTRAVENOUS
  Administered 2022-06-13: 0.5 mg via INTRAVENOUS

## 2022-06-12 NOTE — Plan of Care (Signed)
Pt reporting pain 2/10.  PCA in use.  Problem: Education: Goal: Knowledge of General Education information will improve Description: Including pain rating scale, medication(s)/side effects and non-pharmacologic comfort measures Outcome: Progressing   Problem: Health Behavior/Discharge Planning: Goal: Ability to manage health-related needs will improve Outcome: Progressing   Problem: Clinical Measurements: Goal: Ability to maintain clinical measurements within normal limits will improve Outcome: Progressing Goal: Will remain free from infection Outcome: Progressing Goal: Diagnostic test results will improve Outcome: Progressing Goal: Respiratory complications will improve Outcome: Progressing Goal: Cardiovascular complication will be avoided Outcome: Progressing   Problem: Activity: Goal: Risk for activity intolerance will decrease Outcome: Progressing   Problem: Nutrition: Goal: Adequate nutrition will be maintained Outcome: Progressing   Problem: Coping: Goal: Level of anxiety will decrease Outcome: Progressing   Problem: Elimination: Goal: Will not experience complications related to bowel motility Outcome: Progressing Goal: Will not experience complications related to urinary retention Outcome: Progressing   Problem: Pain Managment: Goal: General experience of comfort will improve Outcome: Progressing   Problem: Safety: Goal: Ability to remain free from injury will improve Outcome: Progressing   Problem: Skin Integrity: Goal: Risk for impaired skin integrity will decrease Outcome: Progressing   Problem: Education: Goal: Knowledge of vaso-occlusive preventative measures will improve Outcome: Progressing Goal: Awareness of infection prevention will improve Outcome: Progressing Goal: Awareness of signs and symptoms of anemia will improve Outcome: Progressing Goal: Long-term complications will improve Outcome: Progressing   Problem: Self-Care: Goal:  Ability to incorporate actions that prevent/reduce pain crisis will improve Outcome: Progressing   Problem: Bowel/Gastric: Goal: Gut motility will be maintained Outcome: Progressing   Problem: Tissue Perfusion: Goal: Complications related to inadequate tissue perfusion will be avoided or minimized Outcome: Progressing   Problem: Respiratory: Goal: Pulmonary complications will be avoided or minimized Outcome: Progressing Goal: Acute Chest Syndrome will be identified early to prevent complications Outcome: Progressing   Problem: Fluid Volume: Goal: Ability to maintain a balanced intake and output will improve Outcome: Progressing   Problem: Sensory: Goal: Pain level will decrease with appropriate interventions Outcome: Progressing   Problem: Health Behavior: Goal: Postive changes in compliance with treatment and prescription regimens will improve Outcome: Progressing

## 2022-06-12 NOTE — Progress Notes (Signed)
Subjective: [name] Hill is a 21 year old male with a medical history significant for sickle cell disease, chronic pain syndrome, opiate dependence and tolerance, history of anemia of chronic disease, and history of mild intermittent asthma was admitted for sickle cell pain crisis. Today, patient continues to complain of pain primarily to chest and low back.  Pain intensity is 5/10.  He states that he is not ready for discharge at this time.  He denies any shortness of breath, headache, urinary symptoms, nausea, vomiting, or diarrhea. Objective:  Vital signs in last 24 hours:  Vitals:   06/12/22 1127 06/12/22 1226 06/12/22 1556 06/12/22 1704  BP: 117/73   138/65  Pulse: 79   66  Resp: 16 14 16 18   Temp: 98.7 F (37.1 C)   98.8 F (37.1 C)  TempSrc: Oral   Oral  SpO2: 95% 94% 97% 97%  Weight:      Height:        Intake/Output from previous day:   Intake/Output Summary (Last 24 hours) at 06/12/2022 1723 Last data filed at 06/12/2022 0006 Gross per 24 hour  Intake 399.89 ml  Output --  Net 399.89 ml    Physical Exam: General: Alert, awake, oriented x3, in no acute distress.  HEENT: [place]/AT PEERL, EOMI Neck: Trachea midline,  no masses, no thyromegal,y no JVD, no carotid bruit OROPHARYNX:  Moist, No exudate/ erythema/lesions.  Heart: Regular rate and rhythm, without murmurs, rubs, gallops, PMI non-displaced, no heaves or thrills on palpation.  Lungs: Clear to auscultation, no wheezing or rhonchi noted. No increased vocal fremitus resonant to percussion  Abdomen: Soft, nontender, nondistended, positive bowel sounds, no masses no hepatosplenomegaly noted..  Neuro: No focal neurological deficits noted cranial nerves II through XII grossly intact. DTRs 2+ bilaterally upper and lower extremities. Strength 5 out of 5 in bilateral upper and lower extremities. Musculoskeletal: No warm swelling or erythema around joints, no spinal tenderness noted. Psychiatric: Patient alert and oriented x3,  good insight and cognition, good recent to remote recall. Lymph node survey: No cervical axillary or inguinal lymphadenopathy noted.  Lab Results:  Basic Metabolic Panel:    Component Value Date/Time   NA 138 06/09/2022 1954   NA 137 04/16/2022 1413   K 3.7 06/09/2022 1954   CL 107 06/09/2022 1954   CO2 25 06/09/2022 1954   BUN 7 06/09/2022 1954   BUN 6 04/16/2022 1413   CREATININE 0.65 06/09/2022 1954   GLUCOSE 99 06/09/2022 1954   CALCIUM 8.9 06/09/2022 1954   CBC:    Component Value Date/Time   WBC 9.6 06/12/2022 0620   HGB 8.2 (L) 06/12/2022 0620   HGB 11.1 (L) 04/16/2022 1413   HCT 22.5 (L) 06/12/2022 0620   HCT 33.4 (L) 04/16/2022 1413   PLT 305 06/12/2022 0620   PLT 760 (H) 04/16/2022 1413   MCV 82.7 06/12/2022 0620   MCV 86 04/16/2022 1413   NEUTROABS 5.7 06/10/2022 1153   NEUTROABS 7.7 (H) 04/16/2022 1413   LYMPHSABS 3.4 06/10/2022 1153   LYMPHSABS 2.8 04/16/2022 1413   MONOABS 1.6 (H) 06/10/2022 1153   EOSABS 0.2 06/10/2022 1153   EOSABS 0.2 04/16/2022 1413   BASOSABS 0.1 06/10/2022 1153   BASOSABS 0.1 04/16/2022 1413    Recent Results (from the past 240 hour(s))  MRSA Next Gen by PCR, Nasal     Status: None   Collection Time: 06/10/22 10:33 AM   Specimen: Nasal Mucosa; Nasal Swab  Result Value Ref Range Status   MRSA  by PCR Next Gen NOT DETECTED NOT DETECTED Final    Comment: (NOTE) The GeneXpert MRSA Assay (FDA approved for NASAL specimens only), is one component of a comprehensive MRSA colonization surveillance program. It is not intended to diagnose MRSA infection nor to guide or monitor treatment for MRSA infections. Test performance is not FDA approved in patients less than 4 years old. Performed at [facility], [address], [place], [place] [address]     Studies/Results: No results found.  Medications: Scheduled Meds:  Chlorhexidine Gluconate Cloth  6 each Topical Daily   folic acid  1 mg Oral Daily    HYDROmorphone   Intravenous Q4H   Continuous Infusions:  sodium chloride 10 mL/hr at 06/12/22 0006   dextrose 5 % and 0.45% NaCl 10 mL/hr at 06/12/22 0006   PRN Meds:.sodium chloride, diphenhydrAMINE, naloxone **AND** sodium chloride flush, ondansetron, ondansetron (ZOFRAN) IV, mouth rinse, oxyCODONE  Consultants: None  Procedures: None  Antibiotics: None  Assessment/Plan: Principal Problem:   Sickle cell pain crisis (HCC) Active Problems:   Anemia of chronic disease   Chronic pain syndrome  Sickle cell disease with pain crisis: Weaning IV Dilaudid PCA Toradol 15 mg every 6 hours Oxycodone 10 mg every 4 hours as needed for severe breakthrough pain Decrease IV fluids to [facility] Monitor vital signs closely, reevaluate pain scale regularly, and supplemental oxygen as needed.  Chronic pain syndrome: Continue home medications  Chronic respiratory failure with hypoxia: Patient is on home oxygen at 2 L, will continue.  Anemia of chronic disease: Hemoglobin is stable and consistent with patient's baseline.  There is no clinical indication for blood transfusion at this time.  Follow labs in AM.  Code Status: Full Code Family Communication: N/A Disposition Plan: Not yet ready for discharge  Nolon Nations  APRN, MSN, FNP-C [facility] [address] [place], [place] [address] [phone]  If 7PM-7AM, please contact night-coverage.  06/12/2022, 5:23 PM  LOS: 2 days

## 2022-06-13 ENCOUNTER — Encounter: Payer: Self-pay | Admitting: Family Medicine

## 2022-06-13 ENCOUNTER — Other Ambulatory Visit (HOSPITAL_COMMUNITY): Payer: Self-pay

## 2022-06-13 MED ORDER — OXYCODONE HCL 10 MG PO TABS
10.0000 mg | ORAL_TABLET | ORAL | 0 refills | Status: DC | PRN
  Filled 2022-06-13: qty 60, 10d supply, fill #0

## 2022-06-13 NOTE — Discharge Summary (Signed)
Physician Discharge Summary  OAK[place] FANT BDZ:[id number] DOB: May 02, 2001 DOA: 06/09/2022  PCP: Massie Maroon, FNP  Admit date: 06/09/2022  Discharge date: 06/13/2022  Discharge Diagnoses:  Principal Problem:   Sickle cell pain crisis (HCC) Active Problems:   Anemia of chronic disease   Chronic pain syndrome   Discharge Condition: Stable  Disposition:   Follow-up Information     Massie Maroon, FNP Follow up in 1 week(s).   Specialty: Family Medicine Contact information: [address] [place] [address] [phone]                Pt is discharged home in good condition and is to follow up with Massie Maroon, FNP this week to have labs evaluated. [name] Hill is instructed to increase activity slowly and balance with rest for the next few days, and use prescribed medication to complete treatment of pain  Diet: Regular Wt Readings from Last 3 Encounters:  06/10/22 98.4 kg  05/08/22 89.4 kg  04/16/22 89.4 kg    History of present illness:  [name] Hill  is a 21 y.o. male with a medical history significant for sickle cell disease, chronic pain syndrome, opiate dependence and tolerance, and history of mild intermittent asthma presented to the emergency department with chest pains and shortness of breath over the past 4 days.  He attributes current pain crisis from dehydration.  Patient was evaluated in the emergency department on 06/07/2022 for this problem.  He states that he felt better and was discharged home.  Patient attempted to manage at home with his home opiate medication regimen that includes oxycodone.  Patient did not have much relief.  He currently rates his pain at 5/10 primarily to right chest and lower extremities.  Patient denies any headache, blurred vision, dizziness, nausea, vomiting, diarrhea, or urinary symptoms.  Of note, [name] Hill is on home oxygen at 2 L/min, he has been nonadherent with wearing oxygen at home.   ER  course: Patient was accepted for admission by Dr. Margo Aye.  While at [facility], patient's vital signs remained stable.  Currently, vital signs:BP (!) 145/69   Pulse 71   Temp 98.6 F (37 C) (Oral)   Resp 17   Ht 5\' 7"  (1.702 m)   Wt 98.4 kg   SpO2 95%   BMI 33.98 kg/m  Saturation is 95% on 2 L.  CT of chest shows no evidence of pulmonary embolus worsening groundglass opacities in lung bases for acute chest syndrome, right pleural effusions, but no other acute abnormalities.  Repeated patient's labs, WBCs 11, hemoglobin 8.6 g/dL, and platelets .  LDH 378.  Most recent comprehensive metabolic panel was unremarkable and did not warrant repeating on today. We will transfer patient to MedSurg for further management sickle cell pain crisis.    Hospital Course:  Sickle cell disease with pain crisis: Patient was admitted for sickle cell pain crisis and managed appropriately with IVF, IV Dilaudid via PCA and IV Toradol, as well as other adjunct therapies per sickle cell pain management protocols. Patient's pain intensity decreased to 5/10 and he is requesting discharge home.  Patient will resume his home medications.  Oxycodone 10 mg every 6 hours #60 was sent to patient's pharmacy.  Review PDMP prior to prescribing opiate medications, no inconsistencies noted. Patient is alert, oriented, and ambulating without assistance.  Recommend that patient restart disease modifying agents including hydroxyurea.  Patient states that he will consider.  We will discuss  at length at his follow-up appointment with PCP.  He will follow-up with his PCP within 1 week to repeat CBC with differential and CMP.  Patient was therefore discharged home today in a hemodynamically stable condition.   [name] Hill will follow-up with PCP within 1 week of this discharge. [name] Hill was counseled extensively about nonpharmacologic means of pain management, patient verbalized understanding and was appreciative of  the care  received during this admission.   We discussed the need for good hydration, monitoring of hydration status, avoidance of heat, cold, stress, and infection triggers. We discussed the need to be adherent with taking home medications. Patient was reminded of the need to seek medical attention immediately if any symptom of bleeding, anemia, or infection occurs.  Discharge Exam: Vitals:   06/13/22 1105 06/13/22 1156  BP:  135/61  Pulse:  75  Resp: 16 15  Temp:  98.9 F (37.2 C)  SpO2: 94% 98%   Vitals:   06/13/22 0500 06/13/22 0726 06/13/22 1105 06/13/22 1156  BP:    135/61  Pulse:    75  Resp: 18 16 16 15   Temp:    98.9 F (37.2 C)  TempSrc:    Oral  SpO2: 94% 95% 94% 98%  Weight:      Height:        General appearance : Awake, alert, not in any distress. Speech Clear. Not toxic looking HEENT: Atraumatic and Normocephalic, pupils equally reactive to light and accomodation Neck: Supple, no JVD. No cervical lymphadenopathy.  Chest: Good air entry bilaterally, no added sounds  CVS: S1 S2 regular, no murmurs.  Abdomen: Bowel sounds present, Non tender and not distended with no gaurding, rigidity or rebound. Extremities: B/L Lower Ext shows no edema, both legs are warm to touch Neurology: Awake alert, and oriented X 3, CN II-XII intact, Non focal Skin: No Rash  Discharge Instructions  Discharge Instructions     Discharge patient   Complete by: As directed    Discharge disposition: 01-Home or Self Care   Discharge patient date: 06/13/2022      Allergies as of 06/13/2022       Reactions   Lisinopril Cough   Pork-derived Products Other (See Comments)   Patient does eat pork for religious reasons   Shellfish Allergy Other (See Comments)   Pt does not eat shellfish for religious reasons   Toradol [ketorolac Tromethamine] Itching        Medication List     TAKE these medications    acetaminophen 500 MG tablet Commonly known as: TYLENOL Take 1,000 mg by mouth 3  (three) times daily as needed for mild pain.   folic acid 1 MG tablet Commonly known as: FOLVITE Take 1 tablet (1 mg total) by mouth daily.   ibuprofen 800 MG tablet Commonly known as: ADVIL Take 1 tablet (800 mg total) by mouth every 8 (eight) hours as needed. What changed:  when to take this reasons to take this   Oxycodone HCl 10 MG Tabs Take 1 tablet (10 mg total) by mouth every 4 (four) hours as needed (pain).        The results of significant diagnostics from this hospitalization (including imaging, microbiology, ancillary and laboratory) are listed below for reference.    Significant Diagnostic Studies: CT Angio Chest PE W/Cm &/Or Wo Cm  Result Date: 06/09/2022 CLINICAL DATA:  Sickle cell crisis.  Suspected pulmonary embolus. EXAM: CT ANGIOGRAPHY CHEST WITH CONTRAST TECHNIQUE: Multidetector CT imaging of the chest was performed using  the standard protocol during bolus administration of intravenous contrast. Multiplanar CT image reconstructions and MIPs were obtained to evaluate the vascular anatomy. RADIATION DOSE REDUCTION: This exam was performed according to the departmental dose-optimization program which includes automated exposure control, adjustment of the mA and/or kV according to patient size and/or use of iterative reconstruction technique. CONTRAST:  75mL OMNIPAQUE IOHEXOL 350 MG/ML SOLN COMPARISON:  Chest x-ray June 09, 2022. CT pulmonary angiogram May 08, 2022. FINDINGS: Cardiovascular: The heart size is mildly enlarged. The thoracic aorta is normal in appearance. Evaluation for peripheral pulmonary emboli is limited due to respiratory motion. Within this limitation, no central emboli identified. Mediastinum/Nodes: There is a small right pleural effusion which is stable in the interval. No left pleural effusion or pericardial effusion. The thyroid and esophagus are normal. No adenopathy. Lungs/Pleura: Central airways are normal. Patchy ground-glass opacities remain  dependently within the lungs worsened in the interval. No nodules or masses. No pneumothorax. Upper Abdomen: No acute abnormality. The spleen remains severely atrophic. Musculoskeletal: Bony sclerosis and vertebral body changes consistent with known sickle cell. Review of the MIP images confirms the above findings. IMPRESSION: 1. No evidence of pulmonary emboli. 2. Worsening ground-glass opacities dependently in the lung bases are concerning for acute chest syndrome given history. 3. Persistent small right pleural effusion. 4. No other acute abnormalities. Electronically Signed   By: Gerome Sam III M.D.   On: 06/09/2022 21:01   DG Chest 2 View  Result Date: 06/09/2022 CLINICAL DATA:  Sickle cell crisis, chest pain and dyspnea EXAM: CHEST - 2 VIEW COMPARISON:  06/06/2022 chest radiograph. FINDINGS: Stable cardiomediastinal silhouette with top-normal heart size. No pneumothorax. No pleural effusion. New streaky hazy right lung base opacity. No pulmonary edema. IMPRESSION: New streaky hazy right lung base opacity, compatible with acute sickle cell crisis. Electronically Signed   By: Delbert Phenix M.D.   On: 06/09/2022 19:45   DG Chest 2 View  Result Date: 06/06/2022 CLINICAL DATA:  Sickle cell crisis, chest and back pain EXAM: CHEST - 2 VIEW COMPARISON:  05/18/2022 FINDINGS: Mild cardiomegaly without CHF or pneumonia. No effusion or pneumothorax. Trachea midline. Chronic osseous changes of sickle cell disease. IMPRESSION: Stable cardiomegaly without acute chest process. Electronically Signed   By: Judie Petit.  Shick M.D.   On: 06/06/2022 16:26   DG Chest Port 1 View  Result Date: 05/18/2022 CLINICAL DATA:  Sickle cell crisis EXAM: PORTABLE CHEST 1 VIEW COMPARISON:  May 08, 2022 FINDINGS: The heart size and mediastinal contours are within normal limits. Both lungs are clear. The visualized skeletal structures are unremarkable. IMPRESSION: No active disease. Electronically Signed   By: Gerome Sam III M.D.    On: 05/18/2022 14:13    Microbiology: Recent Results (from the past 240 hour(s))  MRSA Next Gen by PCR, Nasal     Status: None   Collection Time: 06/10/22 10:33 AM   Specimen: Nasal Mucosa; Nasal Swab  Result Value Ref Range Status   MRSA by PCR Next Gen NOT DETECTED NOT DETECTED Final    Comment: (NOTE) The GeneXpert MRSA Assay (FDA approved for NASAL specimens only), is one component of a comprehensive MRSA colonization surveillance program. It is not intended to diagnose MRSA infection nor to guide or monitor treatment for MRSA infections. Test performance is not FDA approved in patients less than 60 years old. Performed at [facility], [address] [place], [place] [address]      Labs: Basic Metabolic Panel: Recent Labs  Lab 06/06/22  1637 06/09/22 1954  NA 138 138  K 3.7 3.7  CL 107 107  CO2 23 25  GLUCOSE 113* 99  BUN 7 7  CREATININE 0.68 0.65  CALCIUM 8.8* 8.9   Liver Function Tests: Recent Labs  Lab 06/09/22 1954  AST 37  ALT 19  ALKPHOS 75  BILITOT 4.1*  PROT 7.3  ALBUMIN 4.0   No results for input(s): "LIPASE", "AMYLASE" in the last 168 hours. No results for input(s): "AMMONIA" in the last 168 hours. CBC: Recent Labs  Lab 06/06/22 1637 06/09/22 1954 06/10/22 1153 06/11/22 0723 06/12/22 0620  WBC 10.0 11.7* 11.0* 9.4 9.6  NEUTROABS 4.7 7.6 5.7  --   --   HGB 9.5* 8.9* 8.6* 8.7* 8.2*  HCT 25.9* 24.2* 23.9* 24.0* 22.5*  MCV 82.5 82.3 83.6 83.6 82.7  PLT 312 332 302 286 305   Cardiac Enzymes: No results for input(s): "CKTOTAL", "CKMB", "CKMBINDEX", "TROPONINI" in the last 168 hours. BNP: Invalid input(s): "POCBNP" CBG: No results for input(s): "GLUCAP" in the last 168 hours.  Time coordinating discharge: 50 minutes  Signed:  Julianne Hill  [facility] 06/13/2022, 1:05 PM

## 2022-06-13 NOTE — Progress Notes (Signed)
Opened in error

## 2022-06-21 ENCOUNTER — Other Ambulatory Visit: Payer: Self-pay | Admitting: Family Medicine

## 2022-06-21 DIAGNOSIS — G894 Chronic pain syndrome: Secondary | ICD-10-CM

## 2022-06-21 DIAGNOSIS — F1190 Opioid use, unspecified, uncomplicated: Secondary | ICD-10-CM

## 2022-06-21 DIAGNOSIS — D571 Sickle-cell disease without crisis: Secondary | ICD-10-CM

## 2022-06-22 ENCOUNTER — Other Ambulatory Visit: Payer: Self-pay | Admitting: Family Medicine

## 2022-06-22 DIAGNOSIS — G894 Chronic pain syndrome: Secondary | ICD-10-CM

## 2022-06-22 DIAGNOSIS — D571 Sickle-cell disease without crisis: Secondary | ICD-10-CM

## 2022-06-22 DIAGNOSIS — F1190 Opioid use, unspecified, uncomplicated: Secondary | ICD-10-CM

## 2022-06-22 MED ORDER — OXYCODONE HCL 10 MG PO TABS
10.0000 mg | ORAL_TABLET | ORAL | 0 refills | Status: DC | PRN
Start: 2022-06-22 — End: 2022-09-05

## 2022-06-22 NOTE — Progress Notes (Signed)
Reviewed PDMP substance reporting system prior to prescribing opiate medications. No inconsistencies noted.   Meds ordered this encounter  Medications  . Oxycodone HCl 10 MG TABS    Sig: Take 1 tablet (10 mg total) by mouth every 4 (four) hours as needed (pain).    Dispense:  60 tablet    Refill:  0    Order Specific Question:   Supervising Provider    Answer:   JEGEDE, OLUGBEMIGA E [[id number]]     [name] Hill [name] Hill Leonardo Plaia  APRN, MSN, FNP-C [facility] [address]  [place], [place] [address] [phone]  

## 2022-07-23 ENCOUNTER — Ambulatory Visit: Payer: Medicaid Other | Admitting: Family Medicine

## 2022-09-05 ENCOUNTER — Other Ambulatory Visit: Payer: Self-pay | Admitting: Family Medicine

## 2022-09-05 ENCOUNTER — Telehealth: Payer: Self-pay

## 2022-09-05 DIAGNOSIS — G894 Chronic pain syndrome: Secondary | ICD-10-CM

## 2022-09-05 DIAGNOSIS — D571 Sickle-cell disease without crisis: Secondary | ICD-10-CM

## 2022-09-05 DIAGNOSIS — F1190 Opioid use, unspecified, uncomplicated: Secondary | ICD-10-CM

## 2022-09-05 MED ORDER — OXYCODONE HCL 10 MG PO TABS: 10.0000 mg | ORAL_TABLET | ORAL | 0 refills | Status: DC | PRN

## 2022-09-05 NOTE — Telephone Encounter (Signed)
Oxycodone  °

## 2022-09-05 NOTE — Progress Notes (Signed)
Reviewed PDMP substance reporting system prior to prescribing opiate medications. No inconsistencies noted.   Meds ordered this encounter  Medications  . Oxycodone HCl 10 MG TABS    Sig: Take 1 tablet (10 mg total) by mouth every 4 (four) hours as needed (pain).    Dispense:  60 tablet    Refill:  0    Order Specific Question:   Supervising Provider    Answer:   JEGEDE, OLUGBEMIGA E [[id number]]     Iwalani Templeton Moore Lynnlee Revels  APRN, MSN, FNP-C [facility] [address]  [place], [place] [address] [phone]  

## 2022-09-12 ENCOUNTER — Telehealth: Payer: Self-pay

## 2022-09-12 NOTE — Telephone Encounter (Signed)
OXYCODONE PA SUBMITTED TO MEDICAID. [facility] CONFIRMATION # [id number]

## 2022-09-12 NOTE — Telephone Encounter (Signed)
APPROVED 09/12/2022 - 12/11/2022

## 2022-09-22 ENCOUNTER — Emergency Department (HOSPITAL_BASED_OUTPATIENT_CLINIC_OR_DEPARTMENT_OTHER): Payer: Medicaid Other

## 2022-09-22 ENCOUNTER — Emergency Department (HOSPITAL_BASED_OUTPATIENT_CLINIC_OR_DEPARTMENT_OTHER)
Admission: EM | Admit: 2022-09-22 | Discharge: 2022-09-22 | Disposition: A | Discharge status: Home / Self Care | Payer: Medicaid Other | Attending: Emergency Medicine | Admitting: Emergency Medicine

## 2022-09-22 ENCOUNTER — Other Ambulatory Visit: Payer: Self-pay

## 2022-09-22 ENCOUNTER — Encounter (HOSPITAL_BASED_OUTPATIENT_CLINIC_OR_DEPARTMENT_OTHER): Payer: Self-pay | Admitting: Emergency Medicine

## 2022-09-22 DIAGNOSIS — R109 Unspecified abdominal pain: Secondary | ICD-10-CM | POA: Insufficient documentation

## 2022-09-22 DIAGNOSIS — D5700 Hb-SS disease with crisis, unspecified: Secondary | ICD-10-CM | POA: Insufficient documentation

## 2022-09-22 DIAGNOSIS — R519 Headache, unspecified: Secondary | ICD-10-CM | POA: Insufficient documentation

## 2022-09-22 DIAGNOSIS — R0781 Pleurodynia: Secondary | ICD-10-CM | POA: Insufficient documentation

## 2022-09-22 DIAGNOSIS — M549 Dorsalgia, unspecified: Secondary | ICD-10-CM | POA: Insufficient documentation

## 2022-09-22 LAB — COMPREHENSIVE METABOLIC PANEL
ALT: 21 U/L (ref 0–44)
AST: 46 U/L — ABNORMAL HIGH (ref 15–41)
Albumin: 4.2 g/dL (ref 3.5–5.0)
Alkaline Phosphatase: 63 U/L (ref 38–126)
Anion gap: 9 (ref 5–15)
BUN: 7 mg/dL (ref 6–20)
CO2: 23 mmol/L (ref 22–32)
Calcium: 8.7 mg/dL — ABNORMAL LOW (ref 8.9–10.3)
Chloride: 107 mmol/L (ref 98–111)
Creatinine, Ser: 0.79 mg/dL (ref 0.61–1.24)
GFR, Estimated: >60 mL/min (ref >60)
Glucose, Bld: 89 mg/dL (ref 70–99)
Potassium: 3.8 mmol/L (ref 3.5–5.1)
Sodium: 139 mmol/L (ref 135–145)
Total Bilirubin: 3.3 mg/dL — ABNORMAL HIGH (ref 0.3–1.2)
Total Protein: 6.9 g/dL (ref 6.5–8.1)

## 2022-09-22 LAB — CBC WITH DIFFERENTIAL/PLATELET
Abs Immature Granulocytes: 0.13 10*3/uL — ABNORMAL HIGH (ref 0.00–0.07)
Basophils Absolute: 0.1 10*3/uL (ref 0.0–0.1)
Basophils Relative: 1 %
Eosinophils Absolute: 0 10*3/uL (ref 0.0–0.5)
Eosinophils Relative: 0 %
HCT: 27.3 % — ABNORMAL LOW (ref 39.0–52.0)
Hemoglobin: 10 g/dL — ABNORMAL LOW (ref 13.0–17.0)
Immature Granulocytes: 1 %
Lymphocytes Relative: 30 %
Lymphs Abs: 4.7 10*3/uL — ABNORMAL HIGH (ref 0.7–4.0)
MCH: 29.6 pg (ref 26.0–34.0)
MCHC: 36.6 g/dL — ABNORMAL HIGH (ref 30.0–36.0)
MCV: 80.8 fL (ref 80.0–100.0)
Monocytes Absolute: 2.1 10*3/uL — ABNORMAL HIGH (ref 0.1–1.0)
Monocytes Relative: 13 %
Neutro Abs: 8.7 10*3/uL — ABNORMAL HIGH (ref 1.7–7.7)
Neutrophils Relative %: 55 %
Platelets: 425 10*3/uL — ABNORMAL HIGH (ref 150–400)
RBC: 3.38 MIL/uL — ABNORMAL LOW (ref 4.22–5.81)
RDW: 20.6 % — ABNORMAL HIGH (ref 11.5–15.5)
WBC: 15.7 10*3/uL — ABNORMAL HIGH (ref 4.0–10.5)
nRBC: 0.4 % — ABNORMAL HIGH (ref 0.0–0.2)

## 2022-09-22 LAB — RETICULOCYTES
Immature Retic Fract: 33.4 % — ABNORMAL HIGH (ref 2.3–15.9)
RBC.: 3.35 MIL/uL — ABNORMAL LOW (ref 4.22–5.81)
Retic Count, Absolute: 211.4 10*3/uL — ABNORMAL HIGH (ref 19.0–186.0)
Retic Ct Pct: 6.3 % — ABNORMAL HIGH (ref 0.4–3.1)

## 2022-09-22 MED ORDER — SODIUM CHLORIDE 0.45 % IV SOLN: INTRAVENOUS | Status: DC

## 2022-09-22 MED ORDER — SODIUM CHLORIDE 0.9 % IV SOLN
12.5000 mg | Freq: Once | INTRAVENOUS | Status: AC
  Administered 2022-09-22: 12.5 mg via INTRAVENOUS
  Filled 2022-09-22: qty 0.25

## 2022-09-22 MED ORDER — HYDROMORPHONE HCL 1 MG/ML IJ SOLN
2.0000 mg | INTRAMUSCULAR | Status: AC
  Administered 2022-09-22: 2 mg via INTRAVENOUS
  Filled 2022-09-22: qty 2

## 2022-09-22 MED ORDER — ONDANSETRON HCL 4 MG/2ML IJ SOLN
4.0000 mg | INTRAMUSCULAR | Status: DC | PRN
  Filled 2022-09-22: qty 2

## 2022-09-22 MED ORDER — DIPHENHYDRAMINE HCL 50 MG/ML IJ SOLN
INTRAMUSCULAR | Status: AC
  Filled 2022-09-22: qty 1

## 2022-09-22 NOTE — ED Triage Notes (Addendum)
Pt reports sickle cell pain to posterior head, bil ribs and back x 3hrs; took ibuprofen at 1300; out of oxycodone rx; also slammed RT index finger in door (house) today

## 2022-09-22 NOTE — ED Notes (Signed)
Pt with desaturation to 87% despite deep breathing. Pt with recent dose of dilaudid. Pt placed on 2L at this time. SpO2 increased to 97%. RT will continue to monitor and be available as needed.

## 2022-09-22 NOTE — ED Provider Notes (Signed)
[facility] Provider Note   CSN: [id number] Arrival date & time: 09/22/22  1533     History Past medical history of sickle cell disease, asthma, hypertension Chief Complaint  Patient presents with   Sickle Cell Pain Crisis    [name] Hill is a 21 y.o. male.  He is presenting with a sickle cell pain crisis.  He says he had pain in his posterior occipital part of his head, as well as bilateral rib cage, legs, and his back, and abdomen for about 3 hours now.  These areas are typical for sickle cell symptoms.  He took ibuprofen today around 1 PM without much relief.  Says he ran out of his oxycodone about 3 weeks ago and he is normally on 10 mg of immediate release oxycodone.  He is working on getting this filled with his primary doctor.  He also reports slamming his right index finger in the door this morning. He has no numbness or tingling in finger.  He denies any confusion, visual changes, syncope, chest pain, nausea, vomiting, fever, chills, cough, shortness of breath.   Sickle Cell Pain Crisis Associated symptoms: headaches        Home Medications Prior to Admission medications   Medication Sig Start Date End Date Taking? Authorizing Provider  acetaminophen (TYLENOL) 500 MG tablet Take 1,000 mg by mouth 3 (three) times daily as needed for mild pain.    [provider]  folic acid (FOLVITE) 1 MG tablet Take 1 tablet (1 mg total) by mouth daily. 04/10/22 04/10/23  Dorena Dew, FNP  ibuprofen (ADVIL) 800 MG tablet Take 1 tablet (800 mg total) by mouth every 8 (eight) hours as needed. Patient taking differently: Take 800 mg by mouth 2 (two) times daily as needed for moderate pain. 01/10/22   Dorena Dew, FNP  Oxycodone HCl 10 MG TABS Take 1 tablet (10 mg total) by mouth every 4 (four) hours as needed (pain). 09/05/22   Dorena Dew, FNP      Allergies    Lisinopril, Pork-derived products, Shellfish allergy, and Toradol  [ketorolac tromethamine]    Review of Systems   Review of Systems  Gastrointestinal:  Positive for abdominal pain.  Musculoskeletal:  Positive for back pain.  Neurological:  Positive for headaches.  All other systems reviewed and are negative.   Physical Exam Updated Vital Signs BP 136/73   Pulse 79   Temp 98.2 F (36.8 C) (Oral)   Resp 15   Ht _0  (1.702 m)   Wt 91 kg   SpO2 91%   BMI 31.42 kg/m  Physical Exam Vitals and nursing note reviewed.  Constitutional:      General: He is not in acute distress.    Appearance: Normal appearance. He is not ill-appearing, toxic-appearing or diaphoretic.  HENT:     Head: Normocephalic and atraumatic.     Nose: No nasal deformity.     Mouth/Throat:     Lips: Pink. No lesions.     Mouth: Mucous membranes are moist. No injury, lacerations, oral lesions or angioedema.     Pharynx: Oropharynx is clear. Uvula midline. No pharyngeal swelling, oropharyngeal exudate, posterior oropharyngeal erythema or uvula swelling.  Eyes:     General: Gaze aligned appropriately. No scleral icterus.       Right eye: No discharge.        Left eye: No discharge.     Extraocular Movements: Extraocular movements intact.     Conjunctiva/sclera:  Conjunctivae normal.     Right eye: Right conjunctiva is not injected. No exudate or hemorrhage.    Left eye: Left conjunctiva is not injected. No exudate or hemorrhage.    Pupils: Pupils are equal, round, and reactive to light.  Cardiovascular:     Rate and Rhythm: Normal rate and regular rhythm.     Pulses: Normal pulses.          Radial pulses are 2+ on the right side and 2+ on the left side.       Dorsalis pedis pulses are 2+ on the right side and 2+ on the left side.     Heart sounds: Normal heart sounds, S1 normal and S2 normal. Heart sounds not distant. No murmur heard.    No friction rub. No gallop. No S3 or S4 sounds.  Pulmonary:     Effort: Pulmonary effort is normal. No accessory muscle usage or  respiratory distress.     Breath sounds: Normal breath sounds. No stridor. No wheezing, rhonchi or rales.  Chest:     Chest wall: No tenderness.  Abdominal:     General: Abdomen is flat. There is no distension.     Palpations: Abdomen is soft. There is no mass or pulsatile mass.     Tenderness: There is no abdominal tenderness. There is no right CVA tenderness, left CVA tenderness, guarding or rebound.  Musculoskeletal:     Right lower leg: No edema.     Left lower leg: No edema.     Comments: Right index finger without deformity or bruising. ROM intact. Minimal blood underneath nail bed ~ 15%  Skin:    General: Skin is warm and dry.     Coloration: Skin is not jaundiced or pale.     Findings: No bruising, erythema, lesion or rash.  Neurological:     General: No focal deficit present.     Mental Status: He is alert and oriented to person, place, and time. Mental status is at baseline.     GCS: GCS eye subscore is 4. GCS verbal subscore is 5. GCS motor subscore is 6.     Cranial Nerves: No cranial nerve deficit.     Sensory: No sensory deficit.     Motor: No weakness.  Psychiatric:        Mood and Affect: Mood normal.        Behavior: Behavior normal. Behavior is cooperative.     ED Results / Procedures / Treatments   Labs (all labs ordered are listed, but only abnormal results are displayed) Labs Reviewed  CBC WITH DIFFERENTIAL/PLATELET - Abnormal; Notable for the following components:      Result Value   WBC 15.7 (*)    RBC 3.38 (*)    Hemoglobin 10.0 (*)    HCT 27.3 (*)    MCHC 36.6 (*)    RDW 20.6 (*)    Platelets 425 (*)    nRBC 0.4 (*)    Neutro Abs 8.7 (*)    Lymphs Abs 4.7 (*)    Monocytes Absolute 2.1 (*)    Abs Immature Granulocytes 0.13 (*)    All other components within normal limits  RETICULOCYTES - Abnormal; Notable for the following components:   Retic Ct Pct 6.3 (*)    RBC. 3.35 (*)    Retic Count, Absolute 211.4 (*)    Immature Retic Fract 33.4 (*)     All other components within normal limits  COMPREHENSIVE METABOLIC PANEL - Abnormal; Notable  for the following components:   Calcium 8.7 (*)    AST 46 (*)    Total Bilirubin 3.3 (*)    All other components within normal limits  URINALYSIS, ROUTINE W REFLEX MICROSCOPIC    EKG None  Radiology DG Chest 2 View  Result Date: 09/22/2022 CLINICAL DATA:  Sickle cell.  Pain. EXAM: CHEST - 2 VIEW COMPARISON:  June 09, 2022 FINDINGS: Bony changes of sickle cell. The heart, hila, mediastinum, lungs, and pleura are unremarkable. No pneumothorax or infiltrate. IMPRESSION: Bony changes of sickle cell. No other abnormalities. Electronically Signed   By: Dorise Bullion III M.D.   On: 09/22/2022 17:26   DG Finger Index Right  Result Date: 09/22/2022 CLINICAL DATA:  Right index finger slammed in a house door this morning. Pain and bruising. EXAM: RIGHT INDEX FINGER 2+V COMPARISON:  None Available. FINDINGS: No fracture.  No bone lesion. Joints are normally spaced and aligned. Distal soft tissue swelling. IMPRESSION: No fracture or dislocation. Electronically Signed   By: Lajean Manes M.D.   On: 09/22/2022 16:26    Procedures Procedures  This patient was on telemetry or cardiac monitoring during their time in the ED.    Medications Ordered in ED Medications  0.45 % sodium chloride infusion ( Intravenous New Bag/Given 09/22/22 1746)  ondansetron (ZOFRAN) injection 4 mg (has no administration in time range)  diphenhydrAMINE (BENADRYL) 50 MG/ML injection (  Not Given 09/22/22 1906)  HYDROmorphone (DILAUDID) injection 2 mg (2 mg Intravenous Given 09/22/22 1748)  HYDROmorphone (DILAUDID) injection 2 mg (2 mg Intravenous Given 09/22/22 1842)  HYDROmorphone (DILAUDID) injection 2 mg (2 mg Intravenous Given 09/22/22 1949)  diphenhydrAMINE (BENADRYL) 12.5 mg in sodium chloride 0.9 % 50 mL IVPB (0 mg Intravenous Stopped 09/22/22 1907)    ED Course/ Medical Decision Making/ A&P Clinical Course as of  09/22/22 2006  Sun Sep 22, 2022  1908 Reassessed patient. Pain is 4/10 now. He thinks he will be able to go home following final dose of Dilaudid. He has plan to contact pharmacy in the morning to see if he is able to get home medication refilled.  [GL]    Clinical Course User Index [GL] Sherre Poot Adora Fridge, PA-C                           Medical Decision Making Amount and/or Complexity of Data Reviewed Labs: ordered. Radiology: ordered.  Risk Prescription drug management.    MDM  This is a 21 y.o. male who presents to the ED with sickle cell pain crisis The differential of this patient includes but is not limited to Vaso-Occlusive Crisis, Splenic Sequestration, Acute Chest Syndrome, Aplastic Anemia, Hepatic Sequestration. Other Complications: Avascular Necrosis of femoral Head, Pulmonary Embolism, CVA, Priapism, Infection etc.   Initial Impression  Well appearing in no acute distress Afebrile. Vitals stable Sickle Cell labs placed. Started on IV dilaudid, and IVF.   I personally ordered, reviewed, and interpreted all laboratory work and imaging and agree with radiologist interpretation. Results interpreted below: WBC 15.7 consistent with prior during crisis, anemia is table. Ret count stable. CMP without renal dysfunction, LFTs okay, electrolytes okay. CXR without infiltrate. Finger x ray without fracture.  Assessment/Plan:  Presentation consistent with sickle cell pain crisis. Low suspicion for acute chest or any other complication of sickle cell. I have reassessed patient after IVF, dilaudid, and benadryl with significant improvement of symptoms. He feels he is able to be discharged. He plans on  calling pharmacy in the morning to see if he can get his pain medication refilled.    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 I personally viewed and interpreted the cardiac monitor which reveals an underlying rhythm of NSR Medications given in ED: 6 mg Dilaudid, 12.5 mg Benadryl, 1/2 NS infusion Reevaluation of the patient after these medicines showed that the patient improved I have reviewed home medications and made changes accordingly.  Critical Care Interventions: n/a Consultations: n/a Disposition: discharge  This is a supervised visit with my attending physician, Dr. Dondra Spry. We have discussed this patient and they have altered the plan as needed.  Portions of this note were generated with [employer]. Dictation errors may occur despite best attempts at proofreading.      Final Clinical Impression(s) / ED Diagnoses Final diagnoses:  Sickle cell pain crisis [facility])    Rx / DC Orders ED Discharge Orders     None         Adolphus Birchwood, PA-C 09/22/22 2006    [address], DO 09/22/22 2019

## 2022-09-22 NOTE — Discharge Instructions (Signed)
Please follow up with your pharmacist regarding refilling your pain medications. Please follow up with primary doctor who treats your sickle cell for further management.

## 2022-09-23 ENCOUNTER — Encounter (HOSPITAL_BASED_OUTPATIENT_CLINIC_OR_DEPARTMENT_OTHER): Payer: Self-pay | Admitting: Urology

## 2022-09-23 ENCOUNTER — Inpatient Hospital Stay (HOSPITAL_BASED_OUTPATIENT_CLINIC_OR_DEPARTMENT_OTHER)
Admission: EM | Admit: 2022-09-23 | Discharge: 2022-09-26 | DRG: 812 | Disposition: A | Discharge status: Home / Self Care | Payer: Self-pay | Attending: Internal Medicine | Admitting: Internal Medicine

## 2022-09-23 ENCOUNTER — Other Ambulatory Visit: Payer: Self-pay

## 2022-09-23 DIAGNOSIS — I10 Essential (primary) hypertension: Secondary | ICD-10-CM | POA: Diagnosis present

## 2022-09-23 DIAGNOSIS — Z888 Allergy status to other drugs, medicaments and biological substances status: Secondary | ICD-10-CM

## 2022-09-23 DIAGNOSIS — J4520 Mild intermittent asthma, uncomplicated: Secondary | ICD-10-CM | POA: Diagnosis present

## 2022-09-23 DIAGNOSIS — N08 Glomerular disorders in diseases classified elsewhere: Secondary | ICD-10-CM | POA: Diagnosis present

## 2022-09-23 DIAGNOSIS — G894 Chronic pain syndrome: Secondary | ICD-10-CM | POA: Diagnosis present

## 2022-09-23 DIAGNOSIS — D5700 Hb-SS disease with crisis, unspecified: Principal | ICD-10-CM | POA: Diagnosis present

## 2022-09-23 DIAGNOSIS — D638 Anemia in other chronic diseases classified elsewhere: Secondary | ICD-10-CM | POA: Diagnosis present

## 2022-09-23 DIAGNOSIS — J45909 Unspecified asthma, uncomplicated: Secondary | ICD-10-CM | POA: Diagnosis present

## 2022-09-23 DIAGNOSIS — Z91014 Allergy to mammalian meats: Secondary | ICD-10-CM

## 2022-09-23 DIAGNOSIS — F1190 Opioid use, unspecified, uncomplicated: Secondary | ICD-10-CM

## 2022-09-23 DIAGNOSIS — Z83438 Family history of other disorder of lipoprotein metabolism and other lipidemia: Secondary | ICD-10-CM

## 2022-09-23 DIAGNOSIS — Z91013 Allergy to seafood: Secondary | ICD-10-CM

## 2022-09-23 DIAGNOSIS — F1120 Opioid dependence, uncomplicated: Secondary | ICD-10-CM | POA: Diagnosis present

## 2022-09-23 DIAGNOSIS — Z832 Family history of diseases of the blood and blood-forming organs and certain disorders involving the immune mechanism: Secondary | ICD-10-CM

## 2022-09-23 DIAGNOSIS — Z8249 Family history of ischemic heart disease and other diseases of the circulatory system: Secondary | ICD-10-CM

## 2022-09-23 DIAGNOSIS — D72829 Elevated white blood cell count, unspecified: Secondary | ICD-10-CM | POA: Diagnosis present

## 2022-09-23 DIAGNOSIS — D571 Sickle-cell disease without crisis: Secondary | ICD-10-CM

## 2022-09-23 MED ORDER — SODIUM CHLORIDE 0.9 % IV SOLN
12.5000 mg | Freq: Once | INTRAVENOUS | Status: AC
  Administered 2022-09-23: 12.5 mg via INTRAVENOUS
  Filled 2022-09-23: qty 0.25

## 2022-09-23 MED ORDER — HYDROMORPHONE HCL 1 MG/ML IJ SOLN: 2.0000 mg | INTRAMUSCULAR | Status: AC

## 2022-09-23 MED ORDER — HYDROMORPHONE HCL 1 MG/ML IJ SOLN
2.0000 mg | INTRAMUSCULAR | Status: AC
  Administered 2022-09-23: 2 mg via INTRAVENOUS
  Filled 2022-09-23: qty 2

## 2022-09-23 MED ORDER — DIPHENHYDRAMINE HCL 50 MG/ML IJ SOLN
INTRAMUSCULAR | Status: AC
  Administered 2022-09-23: 12.5 mg
  Filled 2022-09-23: qty 1

## 2022-09-23 NOTE — ED Triage Notes (Signed)
Pt was treated here yesterday for sickle cell, states pain has returned  States pain in left leg, back, and chest  Took Ibuprofen 800mg  at 1900

## 2022-09-23 NOTE — ED Provider Notes (Signed)
[facility] Provider Note   CSN: [id number] Arrival date & time: 09/23/22  2037     History {Add pertinent medical, surgical, social history, OB history to HPI:1} Chief Complaint  Patient presents with   Sickle Cell Pain Crisis    [name] Hill is a 21 y.o. male.  He is here with continued sickle cell pain crisis involving his back legs chest and the back of his head.  This is typical of his sickle cell disease although he said it usually does not get posterior headaches.  He was here yesterday for same.  No trauma no fever no shortness of breath.  He is normally on oxycodone orally but due to insurance difficulties has not had access to his medicine for a few weeks.  He follows with the sickle cell clinic in [place].  The history is provided by the patient.  Sickle Cell Pain Crisis Location:  Head, chest, back and lower extremity Severity:  Severe Onset quality:  Gradual Duration:  2 days Similar to previous crisis episodes: yes   Timing:  Constant Progression:  Unchanged Chronicity:  Recurrent Relieved by:  Nothing Worsened by:  Activity and movement Ineffective treatments:  OTC medications Associated symptoms: chest pain and headaches   Associated symptoms: no cough, no fever, no nausea, no shortness of breath, no swelling of legs, no vision change and no vomiting        Home Medications Prior to Admission medications   Medication Sig Start Date End Date Taking? Authorizing Provider  acetaminophen (TYLENOL) 500 MG tablet Take 1,000 mg by mouth 3 (three) times daily as needed for mild pain.    [provider]  folic acid (FOLVITE) 1 MG tablet Take 1 tablet (1 mg total) by mouth daily. 04/10/22 04/10/23  Dorena Dew, FNP  ibuprofen (ADVIL) 800 MG tablet Take 1 tablet (800 mg total) by mouth every 8 (eight) hours as needed. Patient taking differently: Take 800 mg by mouth 2 (two) times daily as needed for moderate pain.  01/10/22   Dorena Dew, FNP  Oxycodone HCl 10 MG TABS Take 1 tablet (10 mg total) by mouth every 4 (four) hours as needed (pain). 09/05/22   Dorena Dew, FNP      Allergies    Lisinopril, Pork-derived products, Shellfish allergy, and Toradol [ketorolac tromethamine]    Review of Systems   Review of Systems  Constitutional:  Negative for fever.  Eyes:  Negative for visual disturbance.  Respiratory:  Negative for cough and shortness of breath.   Cardiovascular:  Positive for chest pain.  Gastrointestinal:  Negative for nausea and vomiting.  Neurological:  Positive for headaches.    Physical Exam Updated Vital Signs BP 126/80 (BP Location: Right Arm)   Pulse 90   Temp 98.5 F (36.9 C) (Oral)   Resp 18   Ht 5\' 7"  (1.702 m)   Wt 91 kg   SpO2 92%   BMI 31.42 kg/m  Physical Exam Vitals and nursing note reviewed.  Constitutional:      General: He is not in acute distress.    Appearance: Normal appearance. He is well-developed.  HENT:     Head: Normocephalic and atraumatic.  Eyes:     Conjunctiva/sclera: Conjunctivae normal.  Cardiovascular:     Rate and Rhythm: Normal rate and regular rhythm.     Heart sounds: No murmur heard. Pulmonary:     Effort: Pulmonary effort is normal. No respiratory distress.  Breath sounds: Normal breath sounds.  Abdominal:     Palpations: Abdomen is soft.     Tenderness: There is no abdominal tenderness.  Musculoskeletal:        General: Normal range of motion.     Cervical back: Neck supple.     Right lower leg: No edema.     Left lower leg: No edema.  Skin:    General: Skin is warm and dry.     Capillary Refill: Capillary refill takes less than 2 seconds.  Neurological:     General: No focal deficit present.     Mental Status: He is alert.     Sensory: No sensory deficit.     Motor: No weakness.     Gait: Gait normal.     ED Results / Procedures / Treatments   Labs (all labs ordered are listed, but only abnormal  results are displayed) Labs Reviewed - No data to display  EKG None  Radiology DG Chest 2 View  Result Date: 09/22/2022 CLINICAL DATA:  Sickle cell.  Pain. EXAM: CHEST - 2 VIEW COMPARISON:  June 09, 2022 FINDINGS: Bony changes of sickle cell. The heart, hila, mediastinum, lungs, and pleura are unremarkable. No pneumothorax or infiltrate. IMPRESSION: Bony changes of sickle cell. No other abnormalities. Electronically Signed   By: Gerome Sam III M.D.   On: 09/22/2022 17:26   DG Finger Index Right  Result Date: 09/22/2022 CLINICAL DATA:  Right index finger slammed in a house door this morning. Pain and bruising. EXAM: RIGHT INDEX FINGER 2+V COMPARISON:  None Available. FINDINGS: No fracture.  No bone lesion. Joints are normally spaced and aligned. Distal soft tissue swelling. IMPRESSION: No fracture or dislocation. Electronically Signed   By: Amie Portland M.D.   On: 09/22/2022 16:26    Procedures Procedures  {Document cardiac monitor, telemetry assessment procedure when appropriate:1}  Medications Ordered in ED Medications  HYDROmorphone (DILAUDID) injection 2 mg (has no administration in time range)  HYDROmorphone (DILAUDID) injection 2 mg (has no administration in time range)  HYDROmorphone (DILAUDID) injection 2 mg (has no administration in time range)  diphenhydrAMINE (BENADRYL) 12.5 mg in sodium chloride 0.9 % 50 mL IVPB (has no administration in time range)    ED Course/ Medical Decision Making/ A&P                           Medical Decision Making Risk Prescription drug management.   This patient complains of ***; this involves an extensive number of treatment Options and is a complaint that carries with it a high risk of complications and morbidity. The differential includes ***  I ordered, reviewed and interpreted labs, which included *** I ordered medication *** and reviewed PMP when indicated. I ordered imaging studies which included *** and I independently     visualized and interpreted imaging which showed *** Additional history obtained from *** Previous records obtained and reviewed *** I consulted *** and discussed lab and imaging findings and discussed disposition.  Cardiac monitoring reviewed, *** Social determinants considered, *** Critical Interventions: ***  After the interventions stated above, I reevaluated the patient and found *** Admission and further testing considered, ***   {Document critical care time when appropriate:1} {Document review of labs and clinical decision tools ie heart score, Chads2Vasc2 etc:1}  {Document your independent review of radiology images, and any outside records:1} {Document your discussion with family members, caretakers, and with consultants:1} {Document social determinants of health  affecting pt's care:1} {Document your decision making why or why not admission, treatments were needed:1} Final Clinical Impression(s) / ED Diagnoses Final diagnoses:  None    Rx / DC Orders ED Discharge Orders     None

## 2022-09-23 NOTE — Discharge Instructions (Signed)
You were seen in the emergency department for continued pain from sickle cell pain crisis.  You were given pain medication with some improvement in your symptoms.  Please contact sickle cell clinic and your insurance company to get restarted on your medications.  Return to the emergency department if any worsening or concerning symptoms.  [facility] [address] [place], [place]

## 2022-09-24 ENCOUNTER — Inpatient Hospital Stay (HOSPITAL_COMMUNITY): Payer: Self-pay

## 2022-09-24 DIAGNOSIS — D5700 Hb-SS disease with crisis, unspecified: Secondary | ICD-10-CM

## 2022-09-24 LAB — CBC WITH DIFFERENTIAL/PLATELET
Abs Immature Granulocytes: 0.05 10*3/uL (ref 0.00–0.07)
Basophils Absolute: 0.1 10*3/uL (ref 0.0–0.1)
Basophils Relative: 1 %
Eosinophils Absolute: 0.1 10*3/uL (ref 0.0–0.5)
Eosinophils Relative: 1 %
HCT: 25.3 % — ABNORMAL LOW (ref 39.0–52.0)
Hemoglobin: 9.3 g/dL — ABNORMAL LOW (ref 13.0–17.0)
Immature Granulocytes: 0 %
Lymphocytes Relative: 31 %
Lymphs Abs: 4.6 10*3/uL — ABNORMAL HIGH (ref 0.7–4.0)
MCH: 29.7 pg (ref 26.0–34.0)
MCHC: 36.8 g/dL — ABNORMAL HIGH (ref 30.0–36.0)
MCV: 80.8 fL (ref 80.0–100.0)
Monocytes Absolute: 2.2 10*3/uL — ABNORMAL HIGH (ref 0.1–1.0)
Monocytes Relative: 15 %
Neutro Abs: 7.7 10*3/uL (ref 1.7–7.7)
Neutrophils Relative %: 52 %
Platelets: 364 10*3/uL (ref 150–400)
RBC: 3.13 MIL/uL — ABNORMAL LOW (ref 4.22–5.81)
RDW: 20.9 % — ABNORMAL HIGH (ref 11.5–15.5)
WBC: 14.7 10*3/uL — ABNORMAL HIGH (ref 4.0–10.5)
nRBC: 0.7 % — ABNORMAL HIGH (ref 0.0–0.2)

## 2022-09-24 LAB — COMPREHENSIVE METABOLIC PANEL
ALT: 20 U/L (ref 0–44)
AST: 37 U/L (ref 15–41)
Albumin: 3.7 g/dL (ref 3.5–5.0)
Alkaline Phosphatase: 61 U/L (ref 38–126)
Anion gap: 5 (ref 5–15)
BUN: 6 mg/dL (ref 6–20)
CO2: 27 mmol/L (ref 22–32)
Calcium: 8.3 mg/dL — ABNORMAL LOW (ref 8.9–10.3)
Chloride: 107 mmol/L (ref 98–111)
Creatinine, Ser: 0.56 mg/dL — ABNORMAL LOW (ref 0.61–1.24)
GFR, Estimated: >60 mL/min (ref >60)
Glucose, Bld: 100 mg/dL — ABNORMAL HIGH (ref 70–99)
Potassium: 3.5 mmol/L (ref 3.5–5.1)
Sodium: 139 mmol/L (ref 135–145)
Total Bilirubin: 4.9 mg/dL — ABNORMAL HIGH (ref 0.3–1.2)
Total Protein: 6.4 g/dL — ABNORMAL LOW (ref 6.5–8.1)

## 2022-09-24 MED ORDER — HYDROMORPHONE HCL 1 MG/ML IJ SOLN
1.0000 mg | INTRAMUSCULAR | Status: DC | PRN
  Administered 2022-09-24: 1 mg via INTRAVENOUS
  Filled 2022-09-24: qty 1

## 2022-09-24 MED ORDER — POLYETHYLENE GLYCOL 3350 17 G PO PACK: 17.0000 g | PACK | Freq: Every day | ORAL | Status: DC | PRN

## 2022-09-24 MED ORDER — OXYCODONE HCL 5 MG PO TABS
10.0000 mg | ORAL_TABLET | Freq: Four times a day (QID) | ORAL | Status: DC | PRN
  Administered 2022-09-24: 10 mg via ORAL
  Filled 2022-09-24: qty 2

## 2022-09-24 MED ORDER — ACETAMINOPHEN 325 MG PO TABS
650.0000 mg | ORAL_TABLET | ORAL | Status: DC | PRN
  Administered 2022-09-24 – 2022-09-26 (×3): 650 mg via ORAL
  Filled 2022-09-24 (×3): qty 2

## 2022-09-24 MED ORDER — SODIUM CHLORIDE 0.9% FLUSH: 9.0000 mL | INTRAVENOUS | Status: DC | PRN

## 2022-09-24 MED ORDER — FOLIC ACID 1 MG PO TABS
1.0000 mg | ORAL_TABLET | Freq: Every day | ORAL | Status: DC
  Administered 2022-09-25 – 2022-09-26 (×2): 1 mg via ORAL
  Filled 2022-09-24 (×2): qty 1

## 2022-09-24 MED ORDER — NALOXONE HCL 0.4 MG/ML IJ SOLN: 0.4000 mg | INTRAMUSCULAR | Status: DC | PRN

## 2022-09-24 MED ORDER — DIPHENHYDRAMINE HCL 25 MG PO CAPS: 25.0000 mg | ORAL_CAPSULE | ORAL | Status: DC | PRN

## 2022-09-24 MED ORDER — SENNOSIDES-DOCUSATE SODIUM 8.6-50 MG PO TABS
1.0000 | ORAL_TABLET | Freq: Two times a day (BID) | ORAL | Status: DC
  Administered 2022-09-24 – 2022-09-26 (×4): 1 via ORAL
  Filled 2022-09-24 (×4): qty 1

## 2022-09-24 MED ORDER — SODIUM CHLORIDE 0.45 % IV SOLN: INTRAVENOUS | Status: DC

## 2022-09-24 MED ORDER — HYDROMORPHONE 1 MG/ML IV SOLN
INTRAVENOUS | Status: DC
  Administered 2022-09-24: 1 mg via INTRAVENOUS
  Administered 2022-09-24: 4.5 mg via INTRAVENOUS
  Administered 2022-09-25: 4 mg via INTRAVENOUS
  Administered 2022-09-25: 1.5 mg via INTRAVENOUS
  Administered 2022-09-25: 3.5 mg via INTRAVENOUS
  Administered 2022-09-25: 2.5 mg via INTRAVENOUS
  Administered 2022-09-26: 3 mg via INTRAVENOUS
  Administered 2022-09-26: 0.1 mg via INTRAVENOUS
  Filled 2022-09-24: qty 30

## 2022-09-24 MED ORDER — ONDANSETRON HCL 4 MG/2ML IJ SOLN: 4.0000 mg | Freq: Four times a day (QID) | INTRAMUSCULAR | Status: DC | PRN

## 2022-09-24 MED ORDER — KETOROLAC TROMETHAMINE 15 MG/ML IJ SOLN
15.0000 mg | Freq: Four times a day (QID) | INTRAMUSCULAR | Status: DC
  Administered 2022-09-24 – 2022-09-26 (×7): 15 mg via INTRAVENOUS
  Filled 2022-09-24 (×7): qty 1

## 2022-09-24 MED ORDER — ENOXAPARIN SODIUM 40 MG/0.4ML IJ SOSY
40.0000 mg | PREFILLED_SYRINGE | INTRAMUSCULAR | Status: DC
  Filled 2022-09-24: qty 0.4

## 2022-09-24 MED ORDER — HYDROMORPHONE HCL 1 MG/ML IJ SOLN
2.0000 mg | INTRAMUSCULAR | Status: DC | PRN
  Administered 2022-09-24 (×2): 2 mg via INTRAVENOUS
  Filled 2022-09-24 (×3): qty 2

## 2022-09-24 MED ORDER — HYDROMORPHONE HCL 1 MG/ML IJ SOLN
2.0000 mg | Freq: Once | INTRAMUSCULAR | Status: AC
  Administered 2022-09-24: 2 mg via INTRAVENOUS
  Filled 2022-09-24: qty 2

## 2022-09-24 NOTE — ED Provider Notes (Signed)
Care assumed at 2330.  Patient with history of sickle cell anemia here for evaluation of pain similar to prior pain crises.  He complains of pain to his leg, back, headache.  No associated chest pain, shortness of breath nausea vomiting.  Care assumed pending reevaluation after pain medications.  On reevaluation patient reports ongoing pain.  Repeat labs today are similar when compared to priors.  Given ongoing pain crisis medicine consulted for admission for ongoing care.    Quintella Reichert, MD 09/24/22 [phone]

## 2022-09-24 NOTE — ED Notes (Signed)
Report accepted by inpatient nurse
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
## 2022-09-24 NOTE — ED Notes (Signed)
[facility] into transport 
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
## 2022-09-24 NOTE — ED Notes (Signed)
Report given to [facility] 

## 2022-09-24 NOTE — Plan of Care (Signed)
[facility] will assume care on arrival to accepting facility. Until arrival, care as per EDP. However, [facility] available 24/7 for questions and assistance.  Nursing staff, please page [facility] Admits and Consults ([phone]) as soon as the patient arrives to the hospital.   

## 2022-09-25 LAB — CBC
HCT: 25.4 % — ABNORMAL LOW (ref 39.0–52.0)
Hemoglobin: 9.3 g/dL — ABNORMAL LOW (ref 13.0–17.0)
MCH: 30.1 pg (ref 26.0–34.0)
MCHC: 36.6 g/dL — ABNORMAL HIGH (ref 30.0–36.0)
MCV: 82.2 fL (ref 80.0–100.0)
Platelets: 351 10*3/uL (ref 150–400)
RBC: 3.09 MIL/uL — ABNORMAL LOW (ref 4.22–5.81)
RDW: 19.3 % — ABNORMAL HIGH (ref 11.5–15.5)
WBC: 12.1 10*3/uL — ABNORMAL HIGH (ref 4.0–10.5)
nRBC: 0.6 % — ABNORMAL HIGH (ref 0.0–0.2)

## 2022-09-25 LAB — BASIC METABOLIC PANEL
Anion gap: 5 (ref 5–15)
BUN: 9 mg/dL (ref 6–20)
CO2: 28 mmol/L (ref 22–32)
Calcium: 8.6 mg/dL — ABNORMAL LOW (ref 8.9–10.3)
Chloride: 105 mmol/L (ref 98–111)
Creatinine, Ser: 0.66 mg/dL (ref 0.61–1.24)
GFR, Estimated: >60 mL/min (ref >60)
Glucose, Bld: 100 mg/dL — ABNORMAL HIGH (ref 70–99)
Potassium: 3.6 mmol/L (ref 3.5–5.1)
Sodium: 138 mmol/L (ref 135–145)

## 2022-09-25 MED ORDER — OXYCODONE HCL 5 MG PO TABS: 10.0000 mg | ORAL_TABLET | ORAL | Status: DC | PRN

## 2022-09-25 NOTE — Progress Notes (Signed)
Subjective: [name] Hill is a 21 year old male with a medical history significant for sickle cell disease, chronic pain syndrome, opiate dependence, and intermittent asthma was admitted for sickle cell pain crisis.  Patient says that pain intensity has improved some overnight. Pain intensity is 5/10. Patient says that he cannot manage at home at this time.  He denies shortness of breath, chest pain, shortness of breath, urinary symptoms, nausea, vomiting, or diarrhea.   Objective:  Vital signs in last 24 hours:  Vitals:   09/25/22 0602 09/25/22 0900 09/25/22 0955 09/25/22 1145  BP: 128/68  (!) 127/56   Pulse: 72  66   Resp: 14  16 18   Temp: 97.6 F (36.4 C)  98.4 F (36.9 C)   TempSrc: Oral  Oral   SpO2: 94% 100% 98%   Weight:      Height:        Intake/Output from previous day:   Intake/Output Summary (Last 24 hours) at 09/25/2022 1213 Last data filed at 09/25/2022 0102 Gross per 24 hour  Intake 2799.25 ml  Output --  Net 2799.25 ml    Physical Exam: General: Alert, awake, oriented x3, in no acute distress.  HEENT: [place]/AT PEER[place], EOMI Neck: Trachea midline,  no masses, no thyromegal,y no JVD, no carotid bruit OROPHARYNX:  Moist, No exudate/ erythema/lesions.  Heart: Regular rate and rhythm, without murmurs, rubs, gallops, PMI non-displaced, no heaves or thrills on palpation.  Lungs: Clear to auscultation, no wheezing or rhonchi noted. No increased vocal fremitus resonant to percussion  Abdomen: Soft, nontender, nondistended, positive bowel sounds, no masses no hepatosplenomegaly noted..  Neuro: No focal neurological deficits noted cranial nerves II through XII grossly intact. DTRs 2+ bilaterally upper and lower extremities. Strength 5 out of 5 in bilateral upper and lower extremities. Musculoskeletal: No warm swelling or erythema around joints, no spinal tenderness noted. Psychiatric: Patient alert and oriented x3, good insight and cognition, good recent to remote  recall. Lymph node survey: No cervical axillary or inguinal lymphadenopathy noted.  Lab Results:  Basic Metabolic Panel:    Component Value Date/Time   NA 138 09/25/2022 0538   NA 137 04/16/2022 1413   K 3.6 09/25/2022 0538   CL 105 09/25/2022 0538   CO2 28 09/25/2022 0538   BUN 9 09/25/2022 0538   BUN 6 04/16/2022 1413   CREATININE 0.66 09/25/2022 0538   GLUCOSE 100 (H) 09/25/2022 0538   CALCIUM 8.6 (L) 09/25/2022 0538   CBC:    Component Value Date/Time   WBC 12.1 (H) 09/25/2022 0538   HGB 9.3 (L) 09/25/2022 0538   HGB 11.1 (L) 04/16/2022 1413   HCT 25.4 (L) 09/25/2022 0538   HCT 33.4 (L) 04/16/2022 1413   PLT 351 09/25/2022 0538   PLT 760 (H) 04/16/2022 1413   MCV 82.2 09/25/2022 0538   MCV 86 04/16/2022 1413   NEUTROABS 7.7 09/24/2022 0239   NEUTROABS 7.7 (H) 04/16/2022 1413   LYMPHSABS 4.6 (H) 09/24/2022 0239   LYMPHSABS 2.8 04/16/2022 1413   MONOABS 2.2 (H) 09/24/2022 0239   EOSABS 0.1 09/24/2022 0239   EOSABS 0.2 04/16/2022 1413   BASOSABS 0.1 09/24/2022 0239   BASOSABS 0.1 04/16/2022 1413    No results found for this or any previous visit (from the past 240 hour(s)).  Studies/Results: CT HEAD WO CONTRAST (5MM)  Result Date: 09/24/2022 CLINICAL DATA:  Headache. EXAM: CT HEAD WITHOUT CONTRAST TECHNIQUE: Contiguous axial images were obtained from the base of the skull through the vertex without  intravenous contrast. RADIATION DOSE REDUCTION: This exam was performed according to the departmental dose-optimization program which includes automated exposure control, adjustment of the mA and/or kV according to patient size and/or use of iterative reconstruction technique. COMPARISON:  Head CT 10/06/2020 and MRI 10/30/2015 FINDINGS: Brain: There is no evidence of an acute infarct, intracranial hemorrhage, mass, midline shift, or extra-axial fluid collection. The ventricles and sulci are normal. The cerebellar tonsils are normally positioned. Vascular: No hyperdense  vessel. Skull: No fracture. Patchy areas of sclerosis in the skull bilaterally which may be related to sickle cell anemia. No destructive skull lesion. Sinuses/Orbits: Visualized paranasal sinuses and mastoid air cells are clear. Old medial right orbital fracture. Other: None. IMPRESSION: Unremarkable CT appearance of the brain. Electronically Signed   By: Sebastian Ache M.D.   On: 09/24/2022 16:35    Medications: Scheduled Meds:  enoxaparin (LOVENOX) injection  40 mg Subcutaneous Q24H   folic acid  1 mg Oral Daily   HYDROmorphone   Intravenous Q4H   ketorolac  15 mg Intravenous Q6H   senna-docusate  1 tablet Oral BID   Continuous Infusions:  sodium chloride 75 mL/hr at 09/25/22 0933   PRN Meds:.acetaminophen, diphenhydrAMINE, naloxone **AND** sodium chloride flush, ondansetron (ZOFRAN) IV, oxyCODONE, polyethylene glycol  Consultants: none  Procedures: none  Antibiotics: none  Assessment/Plan: Principal Problem:   Sickle cell pain crisis (HCC) Active Problems:   Anemia of chronic disease   Asthma   Chronic pain syndrome  Sickle cell disease pain crisis:  Weaning IV dilaudid PCA Toradol 15 mg IV every 6 hours Oxycodone 10 mg every 6 hours as needed Decrease IV fluids to [facility] Monitory vital signs closely, reevaluate pain scale regularly, and supplemental oxygen as needed   Acute intractable headache:  Resolved. Head CT shows no acute intercranial process. Continue to monitor  Chronic pain syndrome:  Continue home pain medications  Leukocytosis:  Stable. More than likely secondary to sickle cell disease. Monitor closely. Labs in am.   Anemia of chronic disease:  Hemoglobin is stale and consistent with baseline. No clinical indication for blood transfusion at this time. CBC in am.   Code Status: Full Code Family Communication: N/A Disposition Plan: Not yet ready for discharge  Nolon Nations  APRN, MSN, FNP-C [facility] [address] [place], [place] [address] [phone]  If 7PM-7AM, please contact night-coverage.  09/25/2022, 12:13 PM  LOS: 1 day

## 2022-09-26 LAB — BASIC METABOLIC PANEL
Anion gap: 6 (ref 5–15)
BUN: 10 mg/dL (ref 6–20)
CO2: 28 mmol/L (ref 22–32)
Calcium: 8.7 mg/dL — ABNORMAL LOW (ref 8.9–10.3)
Chloride: 103 mmol/L (ref 98–111)
Creatinine, Ser: 0.66 mg/dL (ref 0.61–1.24)
GFR, Estimated: >60 mL/min (ref >60)
Glucose, Bld: 107 mg/dL — ABNORMAL HIGH (ref 70–99)
Potassium: 3.8 mmol/L (ref 3.5–5.1)
Sodium: 137 mmol/L (ref 135–145)

## 2022-09-26 LAB — CBC
HCT: 24.4 % — ABNORMAL LOW (ref 39.0–52.0)
Hemoglobin: 8.8 g/dL — ABNORMAL LOW (ref 13.0–17.0)
MCH: 29.6 pg (ref 26.0–34.0)
MCHC: 36.1 g/dL — ABNORMAL HIGH (ref 30.0–36.0)
MCV: 82.2 fL (ref 80.0–100.0)
Platelets: 363 10*3/uL (ref 150–400)
RBC: 2.97 MIL/uL — ABNORMAL LOW (ref 4.22–5.81)
RDW: 19.7 % — ABNORMAL HIGH (ref 11.5–15.5)
WBC: 10.7 10*3/uL — ABNORMAL HIGH (ref 4.0–10.5)
nRBC: 0.4 % — ABNORMAL HIGH (ref 0.0–0.2)

## 2022-09-26 MED ORDER — OXYCODONE HCL 10 MG PO TABS: 10.0000 mg | ORAL_TABLET | ORAL | 0 refills | Status: DC | PRN

## 2022-09-26 NOTE — Discharge Summary (Signed)
Physician Discharge Summary  [name] Hill PPI:[id number] DOB: [birth year] DOA: 09/23/2022  PCP: [name] Dew, FNP  Admit date: 09/23/2022  Discharge date: 09/26/2022  Discharge Diagnoses:  Principal Problem:   Sickle cell pain crisis ([name]) Active Problems:   Anemia of chronic disease   Asthma   Chronic pain syndrome   Discharge Condition: Stable  Disposition:   Follow-up Information     [name] Dew, FNP. Schedule an appointment as soon as possible for a visit .   Specialty: Family Medicine Why: For recheck of your symptoms Contact information: [address] [address] [phone]                Pt is discharged home in good condition and is to follow up with [name] Dew, FNP this week to have labs evaluated. [name] Hill is instructed to increase activity slowly and balance with rest for the next few days, and use prescribed medication to complete treatment of pain  Diet: Regular Wt Readings from Last 3 Encounters:  09/23/22 91 kg  09/22/22 91 kg  06/10/22 98.4 kg    History of present illness:  [name] Hill  is a 21 y.o. male with a medical history significant for sickle cell disease, chronic pain syndrome, opiate dependence, and mild intermittent asthma presented to [facility] with complaints of pain to back of head, low back, chest, and lower extremities that started 2 days prior to presenting. Patient attributes current pain crisis to changes in weather. [name] Hill states that headache persists despite home medications. He was taking home oxycodone without any sustained relief. Patient denies any dizziness, blurred vision, or paresthesias. Patient rates his pain as 7/10. He denies shortness of breath, nausea, vomiting, or diarrhea. Pain persists despite IV dilaudid , IV fluids and IV Toradol.    Hospital Course:  Sickle cell pain crisis: Patient was admitted for sickle cell pain crisis and managed appropriately  with IVF, IV Dilaudid via PCA and IV Toradol, as well as other adjunct therapies per sickle cell pain management protocols.  IV Dilaudid PCA was weaned appropriately and patient is requesting discharge home.  His pain intensity is 3/10.  Patient will resume his home oxycodone.  Oxycodone 10 mg every 6 hours #60 was sent to patient's pharmacy.  PDMP was reviewed prior to prescribing opiate medications, no inconsistencies noted. [name] Hill will follow-up with primary care provider for medication management and to repeat CBC with differential in 1 week.  BP 128/80 (BP Location: Right Arm)   Pulse 74   Temp 97.8 F (36.6 C) (Oral)   Resp 20   Ht 5\' 7"  (1.702 m)   Wt 91 kg   SpO2 99%   BMI 31.42 kg/m  Patient is alert, oriented, and ambulating without assistance.  He is aware of all upcoming appointments.  Patient's pain medication has been sent to his pharmacy.   Patient was therefore discharged home today in a hemodynamically stable condition.   [name] Hill will follow-up with PCP within 1 week of this discharge. [name] Hill was counseled extensively about nonpharmacologic means of pain management, patient verbalized understanding and was appreciative of  the care received during this admission.   We discussed the need for good hydration, monitoring of hydration status, avoidance of heat, cold, stress, and infection triggers. We discussed the need to be adherent with taking  home medications. Patient was reminded of the need to seek medical attention immediately if any symptom of bleeding, anemia, or infection  occurs.  Discharge Exam: Vitals:   09/26/22 0732 09/26/22 0950  BP:  128/80  Pulse:  74  Resp: 16 20  Temp:  97.8 F (36.6 C)  SpO2: 100% 99%   Vitals:   09/26/22 0540 09/26/22 0612 09/26/22 0732 09/26/22 0950  BP:  (!) 118/54  128/80  Pulse:  (!) 58  74  Resp: 16 18 16 20   Temp:  98.1 F (36.7 C)  97.8 F (36.6 C)  TempSrc:  Oral  Oral  SpO2: 96% 99% 100% 99%  Weight:      Height:         General appearance : Awake, alert, not in any distress. Speech Clear. Not toxic looking HEENT: Atraumatic and Normocephalic, pupils equally reactive to light and accomodation Neck: Supple, no JVD. No cervical lymphadenopathy.  Chest: Good air entry bilaterally, no added sounds  CVS: S1 S2 regular, no murmurs.  Abdomen: Bowel sounds present, Non tender and not distended with no gaurding, rigidity or rebound. Extremities: B/L Lower Ext shows no edema, both legs are warm to touch Neurology: Awake alert, and oriented X 3, CN II-XII intact, Non focal Skin: No Rash  Discharge Instructions  Discharge Instructions     Discharge patient   Complete by: As directed    Discharge disposition: 01-Home or Self Care   Discharge patient date: 09/26/2022      Allergies as of 09/26/2022       Reactions   Lisinopril Cough   Pork-derived Products Other (See Comments)   Patient does eat pork for religious reasons   Shellfish Allergy Other (See Comments)   Pt does not eat shellfish for religious reasons   Toradol [ketorolac Tromethamine] Itching        Medication List     TAKE these medications    folic acid 1 MG tablet Commonly known as: FOLVITE Take 1 tablet (1 mg total) by mouth daily.   ibuprofen 800 MG tablet Commonly known as: ADVIL Take 1 tablet (800 mg total) by mouth every 8 (eight) hours as needed. What changed:  when to take this reasons to take this   Oxycodone HCl 10 MG Tabs Take 1 tablet (10 mg total) by mouth every 4 (four) hours as needed (pain).        The results of significant diagnostics from this hospitalization (including imaging, microbiology, ancillary and laboratory) are listed below for reference.    Significant Diagnostic Studies: CT HEAD WO CONTRAST (13/12/2021)  Result Date: 09/24/2022 CLINICAL DATA:  Headache. EXAM: CT HEAD WITHOUT CONTRAST TECHNIQUE: Contiguous axial images were obtained from the base of the skull through the vertex without  intravenous contrast. RADIATION DOSE REDUCTION: This exam was performed according to the departmental dose-optimization program which includes automated exposure control, adjustment of the mA and/or kV according to patient size and/or use of iterative reconstruction technique. COMPARISON:  Head CT 10/06/2020 and MRI 10/30/2015 FINDINGS: Brain: There is no evidence of an acute infarct, intracranial hemorrhage, mass, midline shift, or extra-axial fluid collection. The ventricles and sulci are normal. The cerebellar tonsils are normally positioned. Vascular: No hyperdense vessel. Skull: No fracture. Patchy areas of sclerosis in the skull bilaterally which may be related to sickle cell anemia. No destructive skull lesion. Sinuses/Orbits: Visualized paranasal sinuses and mastoid air cells are clear. Old medial right orbital fracture. Other: None. IMPRESSION: Unremarkable CT appearance of the brain. Electronically Signed   By: 14/03/2015 M.D.   On: 09/24/2022 16:35   DG Chest 2 View  Result  Date: 09/22/2022 CLINICAL DATA:  Sickle cell.  Pain. EXAM: CHEST - 2 VIEW COMPARISON:  June 09, 2022 FINDINGS: Bony changes of sickle cell. The heart, hila, mediastinum, lungs, and pleura are unremarkable. No pneumothorax or infiltrate. IMPRESSION: Bony changes of sickle cell. No other abnormalities. Electronically Signed   By: Gerome Sam III M.D.   On: 09/22/2022 17:26   DG Finger Index Right  Result Date: 09/22/2022 CLINICAL DATA:  Right index finger slammed in a house door this morning. Pain and bruising. EXAM: RIGHT INDEX FINGER 2+V COMPARISON:  None Available. FINDINGS: No fracture.  No bone lesion. Joints are normally spaced and aligned. Distal soft tissue swelling. IMPRESSION: No fracture or dislocation. Electronically Signed   By: Amie Portland M.D.   On: 09/22/2022 16:26    Microbiology: No results found for this or any previous visit (from the past 240 hour(s)).   Labs: Basic Metabolic Panel: Recent  Labs  Lab 09/22/22 1743 09/24/22 0239 09/25/22 0538 09/26/22 0537  NA 139 139 138 137  K 3.8 3.5 3.6 3.8  CL 107 107 105 103  CO2 23 27 28 28   GLUCOSE 89 100* 100* 107*  BUN 7 6 9 10   CREATININE 0.79 0.56* 0.66 0.66  CALCIUM 8.7* 8.3* 8.6* 8.7*   Liver Function Tests: Recent Labs  Lab 09/22/22 1743 09/24/22 0239  AST 46* 37  ALT 21 20  ALKPHOS 63 61  BILITOT 3.3* 4.9*  PROT 6.9 6.4*  ALBUMIN 4.2 3.7   No results for input(s): "LIPASE", "AMYLASE" in the last 168 hours. No results for input(s): "AMMONIA" in the last 168 hours. CBC: Recent Labs  Lab 09/22/22 1743 09/24/22 0239 09/25/22 0538 09/26/22 0537  WBC 15.7* 14.7* 12.1* 10.7*  NEUTROABS 8.7* 7.7  --   --   HGB 10.0* 9.3* 9.3* 8.8*  HCT 27.3* 25.3* 25.4* 24.4*  MCV 80.8 80.8 82.2 82.2  PLT 425* 364 351 363   Cardiac Enzymes: No results for input(s): "CKTOTAL", "CKMB", "CKMBINDEX", "TROPONINI" in the last 168 hours. BNP: Invalid input(s): "POCBNP" CBG: No results for input(s): "GLUCAP" in the last 168 hours.  Time coordinating discharge: 30 minutes  Signed:  13/01/23  APRN, MSN, FNP-C [facility] [address] [place], [address] [place] [phone]   [facility] 09/26/2022, 12:44 PM

## 2022-09-30 ENCOUNTER — Inpatient Hospital Stay (HOSPITAL_BASED_OUTPATIENT_CLINIC_OR_DEPARTMENT_OTHER)
Admission: EM | Admit: 2022-09-30 | Discharge: 2022-10-08 | DRG: 811 | Disposition: A | Discharge status: Home / Self Care | Payer: Self-pay | Attending: Internal Medicine | Admitting: Internal Medicine

## 2022-09-30 ENCOUNTER — Telehealth: Payer: Self-pay

## 2022-09-30 DIAGNOSIS — I119 Hypertensive heart disease without heart failure: Secondary | ICD-10-CM | POA: Diagnosis present

## 2022-09-30 DIAGNOSIS — Z8249 Family history of ischemic heart disease and other diseases of the circulatory system: Secondary | ICD-10-CM

## 2022-09-30 DIAGNOSIS — Z1152 Encounter for screening for COVID-19: Secondary | ICD-10-CM

## 2022-09-30 DIAGNOSIS — I440 Atrioventricular block, first degree: Secondary | ICD-10-CM | POA: Diagnosis present

## 2022-09-30 DIAGNOSIS — Z832 Family history of diseases of the blood and blood-forming organs and certain disorders involving the immune mechanism: Secondary | ICD-10-CM

## 2022-09-30 DIAGNOSIS — D638 Anemia in other chronic diseases classified elsewhere: Secondary | ICD-10-CM | POA: Diagnosis present

## 2022-09-30 DIAGNOSIS — Z79899 Other long term (current) drug therapy: Secondary | ICD-10-CM

## 2022-09-30 DIAGNOSIS — G894 Chronic pain syndrome: Secondary | ICD-10-CM | POA: Diagnosis present

## 2022-09-30 DIAGNOSIS — R6510 Systemic inflammatory response syndrome (SIRS) of non-infectious origin without acute organ dysfunction: Secondary | ICD-10-CM | POA: Diagnosis present

## 2022-09-30 DIAGNOSIS — F1120 Opioid dependence, uncomplicated: Secondary | ICD-10-CM | POA: Diagnosis present

## 2022-09-30 DIAGNOSIS — D5700 Hb-SS disease with crisis, unspecified: Principal | ICD-10-CM | POA: Diagnosis present

## 2022-09-30 DIAGNOSIS — J4520 Mild intermittent asthma, uncomplicated: Secondary | ICD-10-CM | POA: Diagnosis present

## 2022-09-30 DIAGNOSIS — Z83438 Family history of other disorder of lipoprotein metabolism and other lipidemia: Secondary | ICD-10-CM

## 2022-09-30 DIAGNOSIS — Z8616 Personal history of COVID-19: Secondary | ICD-10-CM

## 2022-09-30 DIAGNOSIS — D72829 Elevated white blood cell count, unspecified: Secondary | ICD-10-CM | POA: Diagnosis present

## 2022-09-30 DIAGNOSIS — Z91014 Allergy to mammalian meats: Secondary | ICD-10-CM

## 2022-09-30 DIAGNOSIS — Z888 Allergy status to other drugs, medicaments and biological substances status: Secondary | ICD-10-CM

## 2022-09-30 DIAGNOSIS — Z91013 Allergy to seafood: Secondary | ICD-10-CM

## 2022-09-30 DIAGNOSIS — J9621 Acute and chronic respiratory failure with hypoxia: Secondary | ICD-10-CM | POA: Diagnosis present

## 2022-09-30 NOTE — Telephone Encounter (Signed)
Transition Care Management Follow-up Telephone Call Date of discharge and from where: 09/26/22 How have you been since you were released from the hospital? Per pt he is feeling well Any questions or concerns? No  Items Reviewed: Did the pt receive and understand the discharge instructions provided? Yes Medications obtained and verified? Yes  Other? No  Any new allergies since your discharge? No  Dietary orders reviewed? Yes Do you have support at home? Yes    Follow up appointments reviewed:  PCP Hospital f/u appt confirmed? Yes  Scheduled to see Ambrose Finland on 10/09/22 @ 3:20 PM. [facility] f/u appt confirmed? No   Are transportation arrangements needed? No  If their condition worsens, is the pt aware to call PCP or go to the Emergency Dept.? Yes Was the patient provided with contact information for the PCP's office or ED? Yes Was to pt encouraged to call back with questions or concerns? Yes

## 2022-09-30 NOTE — ED Triage Notes (Signed)
Per EMS, pt from home c/o sickle cell pain X3 hours, took his meds but the pain only got worse. Describes pain all over but does have chest wall pain.    124/86 90pulse 95%RA 12lead - SR

## 2022-10-01 ENCOUNTER — Other Ambulatory Visit: Payer: Self-pay

## 2022-10-01 ENCOUNTER — Encounter (HOSPITAL_BASED_OUTPATIENT_CLINIC_OR_DEPARTMENT_OTHER): Payer: Self-pay | Admitting: Emergency Medicine

## 2022-10-01 ENCOUNTER — Emergency Department (HOSPITAL_BASED_OUTPATIENT_CLINIC_OR_DEPARTMENT_OTHER): Payer: Self-pay

## 2022-10-01 ENCOUNTER — Encounter (HOSPITAL_COMMUNITY): Payer: Self-pay

## 2022-10-01 ENCOUNTER — Other Ambulatory Visit (HOSPITAL_BASED_OUTPATIENT_CLINIC_OR_DEPARTMENT_OTHER): Payer: Self-pay

## 2022-10-01 DIAGNOSIS — D638 Anemia in other chronic diseases classified elsewhere: Secondary | ICD-10-CM

## 2022-10-01 LAB — COMPREHENSIVE METABOLIC PANEL
ALT: 21 U/L (ref 0–44)
AST: 37 U/L (ref 15–41)
Albumin: 4.1 g/dL (ref 3.5–5.0)
Alkaline Phosphatase: 86 U/L (ref 38–126)
Anion gap: 7 (ref 5–15)
BUN: 10 mg/dL (ref 6–20)
CO2: 27 mmol/L (ref 22–32)
Calcium: 8.6 mg/dL — ABNORMAL LOW (ref 8.9–10.3)
Chloride: 103 mmol/L (ref 98–111)
Creatinine, Ser: 0.7 mg/dL (ref 0.61–1.24)
GFR, Estimated: >60 mL/min (ref >60)
Glucose, Bld: 127 mg/dL — ABNORMAL HIGH (ref 70–99)
Potassium: 4.2 mmol/L (ref 3.5–5.1)
Sodium: 137 mmol/L (ref 135–145)
Total Bilirubin: 2.8 mg/dL — ABNORMAL HIGH (ref 0.3–1.2)
Total Protein: 7.4 g/dL (ref 6.5–8.1)

## 2022-10-01 LAB — CBC WITH DIFFERENTIAL/PLATELET
Abs Immature Granulocytes: 0.69 10*3/uL — ABNORMAL HIGH (ref 0.00–0.07)
Basophils Absolute: 0.2 10*3/uL — ABNORMAL HIGH (ref 0.0–0.1)
Basophils Relative: 2 %
Eosinophils Absolute: 0.4 10*3/uL (ref 0.0–0.5)
Eosinophils Relative: 3 %
HCT: 24.6 % — ABNORMAL LOW (ref 39.0–52.0)
Hemoglobin: 9 g/dL — ABNORMAL LOW (ref 13.0–17.0)
Immature Granulocytes: 4 %
Lymphocytes Relative: 45 %
Lymphs Abs: 7.2 10*3/uL — ABNORMAL HIGH (ref 0.7–4.0)
MCH: 29.9 pg (ref 26.0–34.0)
MCHC: 36.6 g/dL — ABNORMAL HIGH (ref 30.0–36.0)
MCV: 81.7 fL (ref 80.0–100.0)
Monocytes Absolute: 1.4 10*3/uL — ABNORMAL HIGH (ref 0.1–1.0)
Monocytes Relative: 9 %
Neutro Abs: 5.7 10*3/uL (ref 1.7–7.7)
Neutrophils Relative %: 37 %
Platelets: 496 10*3/uL — ABNORMAL HIGH (ref 150–400)
RBC: 3.01 MIL/uL — ABNORMAL LOW (ref 4.22–5.81)
RDW: 20 % — ABNORMAL HIGH (ref 11.5–15.5)
Smear Review: NORMAL
WBC: 15.7 10*3/uL — ABNORMAL HIGH (ref 4.0–10.5)
nRBC: 0.7 % — ABNORMAL HIGH (ref 0.0–0.2)

## 2022-10-01 LAB — RETICULOCYTES
Immature Retic Fract: 45.4 % — ABNORMAL HIGH (ref 2.3–15.9)
RBC.: 3.03 MIL/uL — ABNORMAL LOW (ref 4.22–5.81)
Retic Count, Absolute: 186 10*3/uL (ref 19.0–186.0)
Retic Ct Pct: 6.2 % — ABNORMAL HIGH (ref 0.4–3.1)

## 2022-10-01 LAB — PATHOLOGIST SMEAR REVIEW

## 2022-10-01 MED ORDER — FOLIC ACID 1 MG PO TABS
1.0000 mg | ORAL_TABLET | Freq: Every day | ORAL | Status: DC
  Administered 2022-10-02 – 2022-10-08 (×7): 1 mg via ORAL
  Filled 2022-10-01 (×7): qty 1

## 2022-10-01 MED ORDER — SODIUM CHLORIDE 0.45 % IV SOLN: INTRAVENOUS | Status: DC

## 2022-10-01 MED ORDER — ONDANSETRON HCL 4 MG/2ML IJ SOLN
4.0000 mg | Freq: Four times a day (QID) | INTRAMUSCULAR | Status: DC | PRN
  Administered 2022-10-01: 4 mg via INTRAVENOUS
  Filled 2022-10-01: qty 2

## 2022-10-01 MED ORDER — OXYCODONE HCL 5 MG PO TABS: 10.0000 mg | ORAL_TABLET | ORAL | Status: DC | PRN

## 2022-10-01 MED ORDER — OXYCODONE HCL 5 MG PO TABS
5.0000 mg | ORAL_TABLET | ORAL | Status: DC | PRN
  Administered 2022-10-01: 5 mg via ORAL
  Filled 2022-10-01: qty 1

## 2022-10-01 MED ORDER — ACETAMINOPHEN 325 MG PO TABS
650.0000 mg | ORAL_TABLET | Freq: Four times a day (QID) | ORAL | Status: DC | PRN
  Administered 2022-10-01 – 2022-10-03 (×4): 650 mg via ORAL
  Filled 2022-10-01 (×4): qty 2

## 2022-10-01 MED ORDER — HYDROMORPHONE HCL 1 MG/ML IJ SOLN
2.0000 mg | Freq: Once | INTRAMUSCULAR | Status: AC
  Administered 2022-10-01: 2 mg via INTRAVENOUS
  Filled 2022-10-01: qty 2

## 2022-10-01 MED ORDER — HYDROMORPHONE HCL 1 MG/ML IJ SOLN
2.0000 mg | INTRAMUSCULAR | Status: AC | PRN
  Administered 2022-10-01 (×4): 2 mg via INTRAVENOUS
  Filled 2022-10-01 (×4): qty 2

## 2022-10-01 MED ORDER — CALCIUM CARBONATE 1250 (500 CA) MG PO TABS
1.0000 | ORAL_TABLET | Freq: Once | ORAL | Status: AC
  Administered 2022-10-01: 1250 mg via ORAL
  Filled 2022-10-01: qty 1

## 2022-10-01 MED ORDER — SODIUM CHLORIDE 0.9 % IV BOLUS
1000.0000 mL | Freq: Once | INTRAVENOUS | Status: AC
  Administered 2022-10-01: 1000 mL via INTRAVENOUS

## 2022-10-01 MED ORDER — HYDROMORPHONE HCL 1 MG/ML IJ SOLN
2.0000 mg | INTRAMUSCULAR | Status: DC | PRN
  Administered 2022-10-01 (×4): 2 mg via INTRAVENOUS
  Filled 2022-10-01 (×4): qty 2

## 2022-10-01 MED ORDER — POLYETHYLENE GLYCOL 3350 17 G PO PACK
17.0000 g | PACK | Freq: Every day | ORAL | Status: DC | PRN
  Administered 2022-10-04 – 2022-10-06 (×2): 17 g via ORAL
  Filled 2022-10-01 (×3): qty 1

## 2022-10-01 MED ORDER — KETOROLAC TROMETHAMINE 30 MG/ML IJ SOLN
30.0000 mg | Freq: Once | INTRAMUSCULAR | Status: AC
  Administered 2022-10-01: 30 mg via INTRAVENOUS
  Filled 2022-10-01: qty 1

## 2022-10-01 MED ORDER — HYDROMORPHONE HCL 1 MG/ML IJ SOLN
1.0000 mg | INTRAMUSCULAR | Status: DC | PRN
  Administered 2022-10-01: 1 mg via INTRAVENOUS
  Filled 2022-10-01: qty 1

## 2022-10-01 MED ORDER — KETOROLAC TROMETHAMINE 15 MG/ML IJ SOLN
15.0000 mg | Freq: Four times a day (QID) | INTRAMUSCULAR | Status: AC | PRN
  Administered 2022-10-01: 15 mg via INTRAVENOUS
  Filled 2022-10-01: qty 1

## 2022-10-01 MED ORDER — SENNOSIDES-DOCUSATE SODIUM 8.6-50 MG PO TABS
1.0000 | ORAL_TABLET | Freq: Every day | ORAL | Status: DC
  Administered 2022-10-01 – 2022-10-07 (×7): 1 via ORAL
  Filled 2022-10-01 (×7): qty 1

## 2022-10-01 MED ORDER — HYDROMORPHONE 1 MG/ML IV SOLN
INTRAVENOUS | Status: DC
  Filled 2022-10-01: qty 30

## 2022-10-01 MED ORDER — HYDROMORPHONE 1 MG/ML IV SOLN
INTRAVENOUS | Status: DC
  Administered 2022-10-02: 3 mg via INTRAVENOUS
  Administered 2022-10-02: 4 mg via INTRAVENOUS
  Administered 2022-10-02: 3 mg via INTRAVENOUS
  Administered 2022-10-02 – 2022-10-03 (×2): 2.5 mg via INTRAVENOUS
  Administered 2022-10-04: 1 mg via INTRAVENOUS
  Administered 2022-10-04: 3.5 mg via INTRAVENOUS
  Administered 2022-10-04: 2 mg via INTRAVENOUS
  Administered 2022-10-05 (×2): 2.5 mg via INTRAVENOUS
  Administered 2022-10-05: 30 mg via INTRAVENOUS
  Administered 2022-10-05: 0.5 mg via INTRAVENOUS
  Administered 2022-10-05: 3.5 mg via INTRAVENOUS
  Administered 2022-10-05: 1.5 mg via INTRAVENOUS
  Administered 2022-10-06: 2.5 mg via INTRAVENOUS
  Administered 2022-10-06 (×2): 2 mg via INTRAVENOUS
  Administered 2022-10-06: 2.5 mg via INTRAVENOUS
  Administered 2022-10-06: 1 mg via INTRAVENOUS
  Administered 2022-10-06: 3.5 mg via INTRAVENOUS
  Administered 2022-10-07 (×2): 2.5 mg via INTRAVENOUS
  Administered 2022-10-07: 30 mg via INTRAVENOUS
  Administered 2022-10-07: 2 mg via INTRAVENOUS
  Administered 2022-10-07: 1.5 mg via INTRAVENOUS
  Administered 2022-10-08: 2 mg via INTRAVENOUS
  Filled 2022-10-01 (×4): qty 30

## 2022-10-01 MED ORDER — NALOXONE HCL 0.4 MG/ML IJ SOLN: 0.4000 mg | INTRAMUSCULAR | Status: DC | PRN

## 2022-10-01 MED ORDER — DIPHENHYDRAMINE HCL 50 MG/ML IJ SOLN
25.0000 mg | Freq: Four times a day (QID) | INTRAMUSCULAR | Status: DC | PRN
  Administered 2022-10-01: 25 mg via INTRAVENOUS
  Filled 2022-10-01: qty 1

## 2022-10-01 MED ORDER — DIPHENHYDRAMINE HCL 50 MG/ML IJ SOLN
25.0000 mg | Freq: Once | INTRAMUSCULAR | Status: AC
  Administered 2022-10-01: 25 mg via INTRAVENOUS
  Filled 2022-10-01: qty 1

## 2022-10-01 NOTE — ED Notes (Signed)
Awake, crying, restless, moaning, cardiac leads off pt, very tachypnea, requesting pain med

## 2022-10-01 NOTE — ED Notes (Addendum)
Pt tossing and turning in bed, encouraged him to slow his breathing down.  O2 reading in around 88%, placed on 2L [place].

## 2022-10-01 NOTE — Assessment & Plan Note (Signed)
Calcium 8.6.  Replete with oral calcium.

## 2022-10-01 NOTE — ED Notes (Signed)
Report provided to [facility] for transport. 

## 2022-10-01 NOTE — Assessment & Plan Note (Addendum)
Continues 0.45% IV fluids  IV Dilaudid via PCA with settings of 0.5mg , 10-minute lockout with max of 3mg /hr which were his setting in most recent hospitalization Toradol 15mg  q6HR Monitor vital signs closely and re-evaluate pain scale

## 2022-10-01 NOTE — ED Provider Notes (Signed)
[facility] EMERGENCY DEPARTMENT Provider Note   CSN: [id number] Arrival date & time: 09/30/22  2355     History  Chief Complaint  Patient presents with   Sickle Cell Pain Crisis    [name] Hill is a 21 y.o. male.  Patient is a 21 year old male with past medical history of sickle cell disease.  Patient presenting today with complaints of "pain all over".  This feels consistent with his prior sickle cell crises.  He denies to me he is having any fevers or chills.  Symptoms started 3 hours ago and are unrelieved with his home medications.  He describes pain throughout his chest, but no shortness of breath.  The history is provided by the patient.       Home Medications Prior to Admission medications   Medication Sig Start Date End Date Taking? Authorizing Provider  folic acid (FOLVITE) 1 MG tablet Take 1 tablet (1 mg total) by mouth daily. 04/10/22 04/10/23  Massie Maroon, FNP  ibuprofen (ADVIL) 800 MG tablet Take 1 tablet (800 mg total) by mouth every 8 (eight) hours as needed. Patient taking differently: Take 800 mg by mouth 2 (two) times daily as needed for moderate pain. 01/10/22   Massie Maroon, FNP  Oxycodone HCl 10 MG TABS Take 1 tablet (10 mg total) by mouth every 4 (four) hours as needed (pain). 09/26/22   Massie Maroon, FNP      Allergies    Lisinopril, Pork-derived products, Shellfish allergy, and Toradol [ketorolac tromethamine]    Review of Systems   Review of Systems  All other systems reviewed and are negative.   Physical Exam Updated Vital Signs BP (!) 144/110 (BP Location: Right Arm)   Pulse 94   Temp 98.3 F (36.8 C) (Oral)   Resp 17   Ht 5\' 7"  (1.702 m)   Wt 91 kg   SpO2 95%   BMI 31.42 kg/m  Physical Exam Vitals and nursing note reviewed.  Constitutional:      General: He is not in acute distress.    Appearance: He is well-developed. He is not diaphoretic.  HENT:     Head: Normocephalic and atraumatic.  Cardiovascular:      Rate and Rhythm: Normal rate and regular rhythm.     Heart sounds: No murmur heard.    No friction rub.  Pulmonary:     Effort: Pulmonary effort is normal. No respiratory distress.     Breath sounds: Normal breath sounds. No wheezing or rales.  Abdominal:     General: Bowel sounds are normal. There is no distension.     Palpations: Abdomen is soft.     Tenderness: There is no abdominal tenderness.  Musculoskeletal:        General: Normal range of motion.     Cervical back: Normal range of motion and neck supple.  Skin:    General: Skin is warm and dry.  Neurological:     Mental Status: He is alert and oriented to person, place, and time.     Coordination: Coordination normal.     ED Results / Procedures / Treatments   Labs (all labs ordered are listed, but only abnormal results are displayed) Labs Reviewed  COMPREHENSIVE METABOLIC PANEL - Abnormal; Notable for the following components:      Result Value   Glucose, Bld 127 (*)    Calcium 8.6 (*)    Total Bilirubin 2.8 (*)    All other components within normal limits  CBC WITH DIFFERENTIAL/PLATELET - Abnormal; Notable for the following components:   WBC 15.7 (*)    RBC 3.01 (*)    Hemoglobin 9.0 (*)    HCT 24.6 (*)    MCHC 36.6 (*)    RDW 20.0 (*)    Platelets 496 (*)    nRBC 0.7 (*)    Lymphs Abs 7.2 (*)    Monocytes Absolute 1.4 (*)    Basophils Absolute 0.2 (*)    Abs Immature Granulocytes 0.69 (*)    All other components within normal limits  RETICULOCYTES  PATHOLOGIST SMEAR REVIEW    EKG None  Radiology No results found.  Procedures Procedures    Medications Ordered in ED Medications  sodium chloride 0.9 % bolus 1,000 mL (has no administration in time range)  HYDROmorphone (DILAUDID) injection 2 mg (has no administration in time range)  diphenhydrAMINE (BENADRYL) injection 25 mg (has no administration in time range)    ED Course/ Medical Decision Making/ A&P  Patient with history of sickle  cell disease presenting with complaints of "pain all over".  This feels consistent with prior sickle cell crises.  He has not had any relief with his home medications.  Patient arrives here with stable vital signs and is afebrile.  His physical examination reveals no obvious abnormalities.  Work-up initiated including CBC, metabolic panel, and reticulocyte count.  He does have a slight leukocytosis, but laboratory studies are otherwise consistent with baseline.  Chest x-ray is clear.  Patient administered 3 separate doses of Dilaudid, Toradol, Benadryl, and IV fluids, but reports his pain is not adequately controlled.  I have discussed the case with Dr. [place] Crane from the hospitalist service who agrees to admit.  CRITICAL CARE Performed by: Veryl Speak Total critical care time: 40 minutes Critical care time was exclusive of separately billable procedures and treating other patients. Critical care was necessary to treat or prevent imminent or life-threatening deterioration. Critical care was time spent personally by me on the following activities: development of treatment plan with patient and/or surrogate as well as nursing, discussions with consultants, evaluation of patient's response to treatment, examination of patient, obtaining history from patient or surrogate, ordering and performing treatments and interventions, ordering and review of laboratory studies, ordering and review of radiographic studies, pulse oximetry and re-evaluation of patient's condition.   Final Clinical Impression(s) / ED Diagnoses Final diagnoses:  None    Rx / DC Orders ED Discharge Orders     None         Veryl Speak, MD 10/01/22 [phone]

## 2022-10-01 NOTE — ED Notes (Signed)
Lying on stretcher, moaning, has facial grimmacing, restless, will not answer questions. HR elevated, NBP elevated. Will medicate per PRN orders.
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
## 2022-10-01 NOTE — ED Notes (Signed)
Phone Handoff Report given to rec RN at [facility] 6th floor

## 2022-10-01 NOTE — Assessment & Plan Note (Signed)
Hemoglobin stable at 9 which is around baseline

## 2022-10-01 NOTE — ED Notes (Signed)
Xray bedside.

## 2022-10-02 DIAGNOSIS — D5700 Hb-SS disease with crisis, unspecified: Secondary | ICD-10-CM

## 2022-10-02 LAB — CBC
HCT: 24.4 % — ABNORMAL LOW (ref 39.0–52.0)
Hemoglobin: 8.7 g/dL — ABNORMAL LOW (ref 13.0–17.0)
MCH: 29.4 pg (ref 26.0–34.0)
MCHC: 35.7 g/dL (ref 30.0–36.0)
MCV: 82.4 fL (ref 80.0–100.0)
Platelets: 433 10*3/uL — ABNORMAL HIGH (ref 150–400)
RBC: 2.96 MIL/uL — ABNORMAL LOW (ref 4.22–5.81)
RDW: 20.8 % — ABNORMAL HIGH (ref 11.5–15.5)
WBC: 17.9 10*3/uL — ABNORMAL HIGH (ref 4.0–10.5)
nRBC: 4.7 % — ABNORMAL HIGH (ref 0.0–0.2)

## 2022-10-02 LAB — BASIC METABOLIC PANEL
Anion gap: 10 (ref 5–15)
BUN: 7 mg/dL (ref 6–20)
CO2: 24 mmol/L (ref 22–32)
Calcium: 9 mg/dL (ref 8.9–10.3)
Chloride: 103 mmol/L (ref 98–111)
Creatinine, Ser: 0.5 mg/dL — ABNORMAL LOW (ref 0.61–1.24)
GFR, Estimated: >60 mL/min (ref >60)
Glucose, Bld: 111 mg/dL — ABNORMAL HIGH (ref 70–99)
Potassium: 4.3 mmol/L (ref 3.5–5.1)
Sodium: 137 mmol/L (ref 135–145)

## 2022-10-02 MED ORDER — OXYCODONE HCL 5 MG PO TABS
10.0000 mg | ORAL_TABLET | ORAL | Status: DC | PRN
  Administered 2022-10-02 – 2022-10-05 (×6): 10 mg via ORAL
  Filled 2022-10-02 (×6): qty 2

## 2022-10-02 MED ORDER — DIPHENHYDRAMINE HCL 25 MG PO CAPS: 25.0000 mg | ORAL_CAPSULE | Freq: Four times a day (QID) | ORAL | Status: DC | PRN

## 2022-10-02 MED ORDER — ORAL CARE MOUTH RINSE: 15.0000 mL | OROMUCOSAL | Status: DC | PRN

## 2022-10-02 NOTE — Progress Notes (Signed)
Pt educated on the policy that states etCO2 must be worn at all times while PCA is running. Pt agreed to keep it in and verbalized understanding.

## 2022-10-02 NOTE — Progress Notes (Signed)
Subjective: [name] Hill is a 21 year old male with medical history significant for sickle cell disease, chronic pain syndrome, opiate dependence and tolerance was admitted for sickle cell pain crisis. Patient continues to have allover body pain.  He endorses pain to central chest.  He rates his pain as 7/10.  He denies headache, shortness of breath, fatigue, urinary symptoms, dizziness, nausea, vomiting, or diarrhea.  Objective:  Vital signs in last 24 hours:  Vitals:   10/02/22 0116 10/02/22 0425 10/02/22 0728 10/02/22 0829  BP: (!) 145/77 (!) 148/86  (!) 143/80  Pulse: 99 (!) 109  (!) 106  Resp: 18 (!) 22 18 20   Temp: (!) 100.5 F (38.1 C) 99.6 F (37.6 C)  98.7 F (37.1 C)  TempSrc: Oral Oral  Oral  SpO2: 94% 94% 99% 92%  Weight:      Height:        Intake/Output from previous day:   Intake/Output Summary (Last 24 hours) at 10/02/2022 1109 Last data filed at 10/02/2022 1015 Gross per 24 hour  Intake 2562.64 ml  Output 4050 ml  Net -1487.36 ml    Physical Exam: General: Alert, awake, oriented x3, in no acute distress.  HEENT: /AT [place]ERL, EOMI Neck: Trachea midline,  no masses, no thyromegal,y no JVD, no carotid bruit OROPHARYNX:  Moist, No exudate/ erythema/lesions.  Heart: Regular rate and rhythm, without murmurs, rubs, gallops, PMI non-displaced, no heaves or thrills on palpation.  Lungs: Clear to auscultation, no wheezing or rhonchi noted. No increased vocal fremitus resonant to percussion  Abdomen: Soft, nontender, nondistended, positive bowel sounds, no masses no hepatosplenomegaly noted..  Neuro: No focal neurological deficits noted cranial nerves II through XII grossly intact. DTRs 2+ bilaterally upper and lower extremities. Strength 5 out of 5 in bilateral upper and lower extremities. Musculoskeletal: No warm swelling or erythema around joints, no spinal tenderness noted. Psychiatric: Patient alert and oriented x3, good insight and cognition, good recent to  remote recall. Lymph node survey: No cervical axillary or inguinal lymphadenopathy noted.  Lab Results:  Basic Metabolic Panel:    Component Value Date/Time   NA 137 10/02/2022 0653   NA 137 04/16/2022 1413   K 4.3 10/02/2022 0653   CL 103 10/02/2022 0653   CO2 24 10/02/2022 0653   BUN 7 10/02/2022 0653   BUN 6 04/16/2022 1413   CREATININE 0.50 (L) 10/02/2022 0653   GLUCOSE 111 (H) 10/02/2022 0653   CALCIUM 9.0 10/02/2022 0653   CBC:    Component Value Date/Time   WBC 17.9 (H) 10/02/2022 0653   HGB 8.7 (L) 10/02/2022 0653   HGB 11.1 (L) 04/16/2022 1413   HCT 24.4 (L) 10/02/2022 0653   HCT 33.4 (L) 04/16/2022 1413   PLT 433 (H) 10/02/2022 0653   PLT 760 (H) 04/16/2022 1413   MCV 82.4 10/02/2022 0653   MCV 86 04/16/2022 1413   NEUTROABS 5.7 09/30/2022 2357   NEUTROABS 7.7 (H) 04/16/2022 1413   LYMPHSABS 7.2 (H) 09/30/2022 2357   LYMPHSABS 2.8 04/16/2022 1413   MONOABS 1.4 (H) 09/30/2022 2357   EOSABS 0.4 09/30/2022 2357   EOSABS 0.2 04/16/2022 1413   BASOSABS 0.2 (H) 09/30/2022 2357   BASOSABS 0.1 04/16/2022 1413    No results found for this or any previous visit (from the past 240 hour(s)).  Studies/Results: DG Chest Port 1 View  Result Date: 10/01/2022 CLINICAL DATA:  Chest pain; sickle cell crisis EXAM: PORTABLE CHEST 1 VIEW COMPARISON:  Radiographs 09/22/2022 FINDINGS: Heart size at the  upper limits of normal. No focal consolidation, pleural effusion, or pneumothorax. No acute osseous abnormality. IMPRESSION: No active disease. Electronically Signed   By: Minerva Fester M.D.   On: 10/01/2022 01:04    Medications: Scheduled Meds:  folic acid  1 mg Oral Daily   HYDROmorphone   Intravenous Q4H   senna-docusate  1 tablet Oral QHS   Continuous Infusions: PRN Meds:.acetaminophen, diphenhydrAMINE, naLOXone (NARCAN)  injection, ondansetron (ZOFRAN) IV, mouth rinse, polyethylene  glycol  Consultants: none  Procedures: none  Antibiotics: none  Assessment/Plan: Principal Problem:   Sickle cell pain crisis (HCC) Active Problems:   Anemia of chronic disease   Hypocalcemia  Sickle cell disease with pain crisis: Continue IV Dilaudid PCA without any changes today Toradol 15 mg IV every 6 hours Oxycodone 10 mg every 4 hours as needed for severe breakthrough pain Monitor vital signs very closely, reevaluate pain scale regularly, and supplemental oxygen as needed  Anemia of chronic disease: Patient's hemoglobin is stable and consistent with his baseline.  There is no clinical indication for blood transfusion at this time.  We will monitor closely.  Chronic pain syndrome: Continue home medications  Leukocytosis: Stable.  Improving.  More than likely reactive.  No signs of infection.  Patient is afebrile.  We will continue to monitor closely.   Code Status: Full Code Family Communication: N/A Disposition Plan: Not yet ready for discharge  Riddik Senna Rennis Petty  APRN, MSN, FNP-C [facility] [address] [place], [place] [address] [phone]  If 7PM-7AM, please contact night-coverage.  10/02/2022, 11:09 AM  LOS: 0 days

## 2022-10-03 ENCOUNTER — Inpatient Hospital Stay (HOSPITAL_COMMUNITY): Payer: Self-pay

## 2022-10-03 LAB — COMPREHENSIVE METABOLIC PANEL
ALT: 19 U/L (ref 0–44)
AST: 40 U/L (ref 15–41)
Albumin: 3.8 g/dL (ref 3.5–5.0)
Alkaline Phosphatase: 85 U/L (ref 38–126)
Anion gap: 10 (ref 5–15)
BUN: 9 mg/dL (ref 6–20)
CO2: 27 mmol/L (ref 22–32)
Calcium: 9 mg/dL (ref 8.9–10.3)
Chloride: 97 mmol/L — ABNORMAL LOW (ref 98–111)
Creatinine, Ser: 0.63 mg/dL (ref 0.61–1.24)
GFR, Estimated: >60 mL/min (ref >60)
Glucose, Bld: 125 mg/dL — ABNORMAL HIGH (ref 70–99)
Potassium: 4 mmol/L (ref 3.5–5.1)
Sodium: 134 mmol/L — ABNORMAL LOW (ref 135–145)
Total Bilirubin: 6.1 mg/dL — ABNORMAL HIGH (ref 0.3–1.2)
Total Protein: 7.4 g/dL (ref 6.5–8.1)

## 2022-10-03 LAB — CBC WITH DIFFERENTIAL/PLATELET
Abs Immature Granulocytes: 0.15 10*3/uL — ABNORMAL HIGH (ref 0.00–0.07)
Basophils Absolute: 0 10*3/uL (ref 0.0–0.1)
Basophils Relative: 0 %
Eosinophils Absolute: 0 10*3/uL (ref 0.0–0.5)
Eosinophils Relative: 0 %
HCT: 23.7 % — ABNORMAL LOW (ref 39.0–52.0)
Hemoglobin: 8.6 g/dL — ABNORMAL LOW (ref 13.0–17.0)
Immature Granulocytes: 1 %
Lymphocytes Relative: 12 %
Lymphs Abs: 2.2 10*3/uL (ref 0.7–4.0)
MCH: 29.3 pg (ref 26.0–34.0)
MCHC: 36.3 g/dL — ABNORMAL HIGH (ref 30.0–36.0)
MCV: 80.6 fL (ref 80.0–100.0)
Monocytes Absolute: 3.1 10*3/uL — ABNORMAL HIGH (ref 0.1–1.0)
Monocytes Relative: 17 %
Neutro Abs: 13.1 10*3/uL — ABNORMAL HIGH (ref 1.7–7.7)
Neutrophils Relative %: 70 %
Platelets: 354 10*3/uL (ref 150–400)
RBC: 2.94 MIL/uL — ABNORMAL LOW (ref 4.22–5.81)
RDW: 18.7 % — ABNORMAL HIGH (ref 11.5–15.5)
WBC: 18.7 10*3/uL — ABNORMAL HIGH (ref 4.0–10.5)
nRBC: 3.7 % — ABNORMAL HIGH (ref 0.0–0.2)

## 2022-10-03 LAB — RESP PANEL BY RT-PCR (FLU A&B, COVID) ARPGX2
Influenza A by PCR: NEGATIVE
Influenza B by PCR: NEGATIVE
SARS Coronavirus 2 by RT PCR: NEGATIVE

## 2022-10-03 LAB — PROCALCITONIN: Procalcitonin: 0.41 ng/mL

## 2022-10-03 LAB — LACTIC ACID, PLASMA: Lactic Acid, Venous: 0.9 mmol/L (ref 0.5–1.9)

## 2022-10-03 MED ORDER — IPRATROPIUM-ALBUTEROL 0.5-2.5 (3) MG/3ML IN SOLN
3.0000 mL | RESPIRATORY_TRACT | Status: DC
  Administered 2022-10-03: 3 mL via RESPIRATORY_TRACT
  Filled 2022-10-03: qty 3

## 2022-10-03 MED ORDER — GUAIFENESIN-DM 100-10 MG/5ML PO SYRP
5.0000 mL | ORAL_SOLUTION | ORAL | Status: DC | PRN
  Administered 2022-10-06: 5 mL via ORAL
  Filled 2022-10-03: qty 10

## 2022-10-03 MED ORDER — IPRATROPIUM-ALBUTEROL 0.5-2.5 (3) MG/3ML IN SOLN
3.0000 mL | Freq: Three times a day (TID) | RESPIRATORY_TRACT | Status: DC
  Administered 2022-10-04 – 2022-10-07 (×9): 3 mL via RESPIRATORY_TRACT
  Filled 2022-10-03 (×10): qty 3

## 2022-10-03 MED ORDER — SODIUM CHLORIDE 0.9 % IV SOLN
2.0000 g | Freq: Three times a day (TID) | INTRAVENOUS | Status: DC
  Administered 2022-10-03 – 2022-10-05 (×7): 2 g via INTRAVENOUS
  Filled 2022-10-03 (×7): qty 12.5

## 2022-10-03 MED ORDER — IBUPROFEN 200 MG PO TABS
400.0000 mg | ORAL_TABLET | ORAL | Status: AC
  Administered 2022-10-03: 400 mg via ORAL
  Filled 2022-10-03: qty 2

## 2022-10-03 MED ORDER — ALBUTEROL SULFATE (2.5 MG/3ML) 0.083% IN NEBU
2.5000 mg | INHALATION_SOLUTION | Freq: Four times a day (QID) | RESPIRATORY_TRACT | Status: DC | PRN
  Administered 2022-10-03 – 2022-10-06 (×3): 2.5 mg via RESPIRATORY_TRACT
  Filled 2022-10-03 (×2): qty 3

## 2022-10-03 MED ORDER — SODIUM CHLORIDE 0.9 % IV BOLUS
500.0000 mL | Freq: Once | INTRAVENOUS | Status: AC
  Administered 2022-10-03: 500 mL via INTRAVENOUS

## 2022-10-03 MED ORDER — MONTELUKAST SODIUM 10 MG PO TABS
10.0000 mg | ORAL_TABLET | Freq: Every day | ORAL | Status: DC
  Administered 2022-10-03 – 2022-10-07 (×5): 10 mg via ORAL
  Filled 2022-10-03 (×5): qty 1

## 2022-10-03 NOTE — Progress Notes (Addendum)
Subjective: [name] Hill is a 21 year old male with medical history significant for sickle cell disease, chronic pain syndrome, opiate dependence and tolerance was admitted for sickle cell pain crisis. Patient continues to have allover body pain.  He endorses pain to central chest and shortness of breath.  Chest x-ray shows cardiomegaly with mild pulmonary vascular congestion.  Patient spiked temperature overnight, max 101.4.  He rates his pain as 7/10.  He denies headache, fatigue, urinary symptoms, dizziness, nausea, vomiting, or diarrhea.  Objective:  Vital signs in last 24 hours:  Vitals:   10/03/22 0414 10/03/22 0629 10/03/22 0919 10/03/22 1007  BP: (!) 156/84 (!) 150/87  (!) 144/86  Pulse: (!) 106 99  81  Resp: 18 16  20   Temp: (!) 101.4 F (38.6 C) 99.6 F (37.6 C)  99 F (37.2 C)  TempSrc: Oral Oral  Oral  SpO2: 93% 94% 99% 100%  Weight:      Height:        Intake/Output from previous day:   Intake/Output Summary (Last 24 hours) at 10/03/2022 1045 Last data filed at 10/03/2022 0725 Gross per 24 hour  Intake --  Output 3600 ml  Net -3600 ml    Physical Exam: General: Alert, awake, oriented x3, in no acute distress.  HEENT: [place]/AT PEERL, EOMI Neck: Trachea midline,  no masses, no thyromegal,y no JVD, no carotid bruit OROPHARYNX:  Moist, No exudate/ erythema/lesions.  Heart: Regular rate and rhythm, without murmurs, rubs, gallops, PMI non-displaced, no heaves or thrills on palpation.  Lungs: Clear to auscultation, no wheezing or rhonchi noted. No increased vocal fremitus resonant to percussion  Abdomen: Soft, nontender, nondistended, positive bowel sounds, no masses no hepatosplenomegaly noted..  Neuro: No focal neurological deficits noted cranial nerves II through XII grossly intact. DTRs 2+ bilaterally upper and lower extremities. Strength 5 out of 5 in bilateral upper and lower extremities. Musculoskeletal: No warm swelling or erythema around joints, no spinal  tenderness noted. Psychiatric: Patient alert and oriented x3, good insight and cognition, good recent to remote recall. Lymph node survey: No cervical axillary or inguinal lymphadenopathy noted.  Lab Results:  Basic Metabolic Panel:    Component Value Date/Time   NA 134 (L) 10/03/2022 0501   NA 137 04/16/2022 1413   K 4.0 10/03/2022 0501   CL 97 (L) 10/03/2022 0501   CO2 27 10/03/2022 0501   BUN 9 10/03/2022 0501   BUN 6 04/16/2022 1413   CREATININE 0.63 10/03/2022 0501   GLUCOSE 125 (H) 10/03/2022 0501   CALCIUM 9.0 10/03/2022 0501   CBC:    Component Value Date/Time   WBC 18.7 (H) 10/03/2022 0501   HGB 8.6 (L) 10/03/2022 0501   HGB 11.1 (L) 04/16/2022 1413   HCT 23.7 (L) 10/03/2022 0501   HCT 33.4 (L) 04/16/2022 1413   PLT 354 10/03/2022 0501   PLT 760 (H) 04/16/2022 1413   MCV 80.6 10/03/2022 0501   MCV 86 04/16/2022 1413   NEUTROABS 13.1 (H) 10/03/2022 0501   NEUTROABS 7.7 (H) 04/16/2022 1413   LYMPHSABS 2.2 10/03/2022 0501   LYMPHSABS 2.8 04/16/2022 1413   MONOABS 3.1 (H) 10/03/2022 0501   EOSABS 0.0 10/03/2022 0501   EOSABS 0.2 04/16/2022 1413   BASOSABS 0.0 10/03/2022 0501   BASOSABS 0.1 04/16/2022 1413    Recent Results (from the past 240 hour(s))  Resp Panel by RT-PCR (Flu A&B, Covid) Anterior Nasal Swab     Status: None   Collection Time: 10/03/22  2:54 AM  Specimen: Anterior Nasal Swab  Result Value Ref Range Status   SARS Coronavirus 2 by RT PCR NEGATIVE NEGATIVE Final    Comment: (NOTE) SARS-CoV-2 target nucleic acids are NOT DETECTED.  The SARS-CoV-2 RNA is generally detectable in upper respiratory specimens during the acute phase of infection. The lowest concentration of SARS-CoV-2 viral copies this assay can detect is 138 copies/mL. A negative result does not preclude SARS-Cov-2 infection and should not be used as the sole basis for treatment or other patient management decisions. A negative result may occur with  improper specimen  collection/handling, submission of specimen other than nasopharyngeal swab, presence of viral mutation(s) within the areas targeted by this assay, and inadequate number of viral copies(<138 copies/mL). A negative result must be combined with clinical observations, patient history, and epidemiological information. The expected result is Negative.  Fact Sheet for Patients:  BloggerCourse.com  Fact Sheet for Healthcare Providers:  SeriousBroker.it  This test is no t yet approved or cleared by the [place] FDA and  has been authorized for detection and/or diagnosis of SARS-CoV-2 by FDA under an Emergency Use Authorization (EUA). This EUA will remain  in effect (meaning this test can be used) for the duration of the COVID-19 declaration under Section 564(b)(1) of the Act, 21 U.S.C.section 360bbb-3(b)(1), unless the authorization is terminated  or revoked sooner.       Influenza A by PCR NEGATIVE NEGATIVE Final   Influenza B by PCR NEGATIVE NEGATIVE Final    Comment: (NOTE) The Xpert Xpress SARS-CoV-2/FLU/RSV plus assay is intended as an aid in the diagnosis of influenza from Nasopharyngeal swab specimens and should not be used as a sole basis for treatment. Nasal washings and aspirates are unacceptable for Xpert Xpress SARS-CoV-2/FLU/RSV testing.  Fact Sheet for Patients: BloggerCourse.com  Fact Sheet for Healthcare Providers: SeriousBroker.it  This test is not yet approved or cleared by the [place] FDA and has been authorized for detection and/or diagnosis of SARS-CoV-2 by FDA under an Emergency Use Authorization (EUA). This EUA will remain in effect (meaning this test can be used) for the duration of the COVID-19 declaration under Section 564(b)(1) of the Act, 21 U.S.C. section 360bbb-3(b)(1), unless the authorization is terminated or revoked.  Performed at [facility], [address] [place], [place] [address]     Studies/Results: DG Chest Port 1 View  Result Date: 10/03/2022 CLINICAL DATA:  Shortness of breath. EXAM: PORTABLE CHEST 1 VIEW COMPARISON:  10/01/2022. FINDINGS: Heart is enlarged the mediastinal contour is stable. The pulmonary vasculature is mildly distended. Lung volumes are low. No consolidation, effusion, or pneumothorax. No acute osseous abnormality. IMPRESSION: Cardiomegaly with mild pulmonary vascular congestion. Electronically Signed   By: Thornell Sartorius M.D.   On: 10/03/2022 03:10    Medications: Scheduled Meds:  folic acid  1 mg Oral Daily   HYDROmorphone   Intravenous Q4H   ipratropium-albuterol  3 mL Nebulization Q4H   senna-docusate  1 tablet Oral QHS   Continuous Infusions:  ceFEPime (MAXIPIME) IV 2 g (10/03/22 0629)   PRN Meds:.acetaminophen, diphenhydrAMINE, naLOXone (NARCAN)  injection, ondansetron (ZOFRAN) IV, mouth rinse, oxyCODONE, polyethylene glycol  Consultants: none  Procedures: none  Antibiotics: IV cefepime  Assessment/Plan: Principal Problem:   Sickle cell pain crisis (HCC) Active Problems:   Anemia of chronic disease   Leukocytosis   Chronic pain syndrome   Hypocalcemia   SIRS: Fever of unknown origin, maximum temperature 101.4.  Blood cultures collected.  Procalcitonin and lactic acid negative.  Urine  culture pending.  Initiated broad-spectrum antibiotics.  We will continue to monitor closely, consider de-escalating antibiotics in AM.  Mild intermittent asthma: Patient complaining of increased shortness of breath.  Initiate duo nebulizers every 4 hours.  Reassess in AM.   Sickle cell disease with pain crisis: Continue IV Dilaudid PCA without any changes today Toradol 15 mg IV every 6 hours Oxycodone 10 mg every 4 hours as needed for severe breakthrough pain Monitor vital signs very closely, reevaluate pain scale regularly, and supplemental oxygen as  needed  Anemia of chronic disease: Patient's hemoglobin is stable and consistent with his baseline.  There is no clinical indication for blood transfusion at this time.  We will monitor closely.  Chronic pain syndrome: Continue home medications  Leukocytosis: Stable.  Refer to above.  Monitor closely.  Labs in AM.  Code Status: Full Code Family Communication: N/A Disposition Plan: Not yet ready for discharge  Nolon Nations  APRN, MSN, FNP-C [facility] [address] [place], [place] [address] [phone]  If 7PM-7AM, please contact night-coverage.  10/03/2022, 10:45 AM  LOS: 1 day

## 2022-10-03 NOTE — Progress Notes (Signed)
   10/03/22 0205  Assess: MEWS Score  Temp (!) 102.7 F (39.3 C)  BP (!) 142/91  MAP (mmHg) 105  Pulse Rate (!) 110  Resp 16  SpO2 94 %  O2 Device Nasal Cannula  Assess: MEWS Score  MEWS Temp 2  MEWS Systolic 0  MEWS Pulse 1  MEWS RR 0  MEWS LOC 0  MEWS Score 3  MEWS Score Color Yellow  Assess: if the MEWS score is Yellow or Red  Were vital signs taken at a resting state? Yes  Focused Assessment Change from prior assessment (see assessment flowsheet) (fever)  Does the patient meet 2 or more of the SIRS criteria? No  Does the patient have a confirmed or suspected source of infection? No  MEWS guidelines implemented *See Row Information* Yes  Take Vital Signs  Increase Vital Sign Frequency  Yellow: Q 2hr X 2 then Q 4hr X 2, if remains yellow, continue Q 4hrs  Escalate  MEWS: Escalate Yellow: discuss with charge nurse/RN and consider discussing with provider and RRT  Notify: Charge Nurse/RN  Name of Charge Nurse/RN Notified Renita Mayhan RN  Date Charge Nurse/RN Notified 10/03/22  Time Charge Nurse/RN Notified [id number]  Notify: Provider  Provider Name/Title Anthoney Harada NP  Date Provider Notified 10/03/22  Time Provider Notified 0210  Method of Notification Page  Notification Reason Other (Comment) (yellow MEWS, fever)  Provider response No new orders  Date of Provider Response 10/03/22  Time of Provider Response [phone]  Assess: SIRS CRITERIA  SIRS Temperature  1  SIRS Pulse 1  SIRS Respirations  0  SIRS WBC 1  SIRS Score Sum  3   Yellow MEWS due to fever, tachycardia. On call NP notified, no new orders at this time. PRN tylenol given for fever, will recheck VS per yellow MEWS protocol. Mick Sell RN

## 2022-10-03 NOTE — Progress Notes (Addendum)
       CROSS COVER NOTE  NAME: RHILEY SOLEM MRN: [id number] DOB : 12-08-2000    Date of Service   10/03/2022   HPI/Events of Note   Notified by bedside RN of fever 102.7 F and tachycardia of HR 110. Patient was admitted for sickle cell pain crisis.  Leukocytosis noted since admission, WBC 15.7 --> 17.9.  At this time patient meets SIRS criteria.   During bedside assessment patient is alert and oriented x4.  The patient does complain of generalized pain that is slightly relieved by Dilaudid PCA medication.  He also endorses intermittent "left side rib pain" and shortness of breath when coughing.  He states that he has been coughing up some slightly yellow secretions.  Breath sounds are clear.    Patient denies any nausea, vomiting, or abdominal tenderness or pain.  He denies dysuria, retention, or bleeding when voiding.  The patient also denies being near or living with anyone who has been recently sick.  He also denies having any sick visitors.  Work-up: CBC, BMP, lactic acid, procalcitonin Chest x-ray, respiratory panel Urinalysis Blood cultures  0400- Negative flu & COVID, chest xray shows mild pulmonary vascular congestion but no focal consolidation, pleural effusion, or pneumothorax.  Patient to be given 500 cc IV fluid bolus.  Will be started on IV cefepime as well.  Daytime team to follow-up on further antibiotic need.  0530- LA normal  Interventions/ Plan   Sepsis work-up IV cefepime     Anthoney Harada, DNP, ACNPC- AG [facility]

## 2022-10-03 NOTE — Progress Notes (Signed)
Pharmacy Antibiotic Note  [name] Hill is a 21 y.o. male admitted on 09/30/2022 with sickle cell pain crisis.  Today (10/03/2022) patient febrile (Tm 102.85F), tachycardic with elevated WBC (currently 17.9)/  Pharmacy has been consulted for Cefepime dosing.  Plan: Cefepime 2gm IV q8h Follow renal function F/u culture results and sensitivities  Height: 5\' 7"  (170.2 cm) Weight: 91 kg (200 lb 9.9 oz) IBW/kg (Calculated) : 66.1  Temp (24hrs), Avg:100.1 F (37.8 C), Min:98.5 F (36.9 C), Max:102.7 F (39.3 C)  Recent Labs  Lab 09/30/22 2357 10/02/22 0653  WBC 15.7* 17.9*  CREATININE 0.70 0.50*    Estimated Creatinine Clearance: 154.6 mL/min (A) (by C-G formula based on SCr of 0.5 mg/dL (L)).    Allergies  Allergen Reactions   Lisinopril Cough   Pork-Derived Products Other (See Comments)    Patient does eat pork for religious reasons   Shellfish Allergy Other (See Comments)    Pt does not eat shellfish for religious reasons    Antimicrobials this admission: 11/9 Cefepime >>      Dose adjustments this admission:    Microbiology results: 11/9 BCx:   11/9 UCx:       Thank you for allowing pharmacy to be a part of this patient's care.  13/9, PharmD 10/03/2022 5:14 AM

## 2022-10-04 LAB — URINE CULTURE
Culture: NO GROWTH
Special Requests: NORMAL

## 2022-10-04 MED ORDER — IBUPROFEN 200 MG PO TABS
400.0000 mg | ORAL_TABLET | ORAL | Status: AC
  Administered 2022-10-04: 400 mg via ORAL
  Filled 2022-10-04: qty 2

## 2022-10-04 NOTE — Progress Notes (Signed)
Subjective: [name] Hill is a 21 year old male with medical history significant for sickle cell disease, chronic pain syndrome, opiate dependence and tolerance was admitted for sickle cell pain crisis. Patient continues to have allover body pain.  He endorses pain to central chest and shortness of breath.  Chest x-ray shows cardiomegaly with mild pulmonary vascular congestion.  Patient afebrile overnight.   He rates his pain as 7/10.  He denies headache, fatigue, urinary symptoms, dizziness, nausea, vomiting, or diarrhea.  Objective:  Vital signs in last 24 hours:  Vitals:   10/04/22 1602 10/04/22 2031 10/04/22 2047 10/04/22 2125  BP:  131/79    Pulse:  (!) 104    Resp:  18 19   Temp:  99.9 F (37.7 C)    TempSrc:  Oral    SpO2: 90% 91% 92% 91%  Weight:      Height:        Intake/Output from previous day:   Intake/Output Summary (Last 24 hours) at 10/04/2022 2135 Last data filed at 10/04/2022 0600 Gross per 24 hour  Intake 360 ml  Output 800 ml  Net -440 ml    Physical Exam: General: Alert, awake, oriented x3, in no acute distress.  HEENT: [place]/AT PEERL, EOMI Neck: Trachea midline,  no masses, no thyromegal,y no JVD, no carotid bruit OROPHARYNX:  Moist, No exudate/ erythema/lesions.  Heart: Regular rate and rhythm, without murmurs, rubs, gallops, PMI non-displaced, no heaves or thrills on palpation.  Lungs: Clear to auscultation, no wheezing or rhonchi noted. No increased vocal fremitus resonant to percussion  Abdomen: Soft, nontender, nondistended, positive bowel sounds, no masses no hepatosplenomegaly noted..  Neuro: No focal neurological deficits noted cranial nerves II through XII grossly intact. DTRs 2+ bilaterally upper and lower extremities. Strength 5 out of 5 in bilateral upper and lower extremities. Musculoskeletal: No warm swelling or erythema around joints, no spinal tenderness noted. Psychiatric: Patient alert and oriented x3, good insight and cognition, good  recent to remote recall. Lymph node survey: No cervical axillary or inguinal lymphadenopathy noted.  Lab Results:  Basic Metabolic Panel:    Component Value Date/Time   NA 134 (L) 10/03/2022 0501   NA 137 04/16/2022 1413   K 4.0 10/03/2022 0501   CL 97 (L) 10/03/2022 0501   CO2 27 10/03/2022 0501   BUN 9 10/03/2022 0501   BUN 6 04/16/2022 1413   CREATININE 0.63 10/03/2022 0501   GLUCOSE 125 (H) 10/03/2022 0501   CALCIUM 9.0 10/03/2022 0501   CBC:    Component Value Date/Time   WBC 18.7 (H) 10/03/2022 0501   HGB 8.6 (L) 10/03/2022 0501   HGB 11.1 (L) 04/16/2022 1413   HCT 23.7 (L) 10/03/2022 0501   HCT 33.4 (L) 04/16/2022 1413   PLT 354 10/03/2022 0501   PLT 760 (H) 04/16/2022 1413   MCV 80.6 10/03/2022 0501   MCV 86 04/16/2022 1413   NEUTROABS 13.1 (H) 10/03/2022 0501   NEUTROABS 7.7 (H) 04/16/2022 1413   LYMPHSABS 2.2 10/03/2022 0501   LYMPHSABS 2.8 04/16/2022 1413   MONOABS 3.1 (H) 10/03/2022 0501   EOSABS 0.0 10/03/2022 0501   EOSABS 0.2 04/16/2022 1413   BASOSABS 0.0 10/03/2022 0501   BASOSABS 0.1 04/16/2022 1413    Recent Results (from the past 240 hour(s))  Resp Panel by RT-PCR (Flu A&B, Covid) Anterior Nasal Swab     Status: None   Collection Time: 10/03/22  2:54 AM   Specimen: Anterior Nasal Swab  Result Value Ref Range Status  SARS Coronavirus 2 by RT PCR NEGATIVE NEGATIVE Final    Comment: (NOTE) SARS-CoV-2 target nucleic acids are NOT DETECTED.  The SARS-CoV-2 RNA is generally detectable in upper respiratory specimens during the acute phase of infection. The lowest concentration of SARS-CoV-2 viral copies this assay can detect is 138 copies/mL. A negative result does not preclude SARS-Cov-2 infection and should not be used as the sole basis for treatment or other patient management decisions. A negative result may occur with  improper specimen collection/handling, submission of specimen other than nasopharyngeal swab, presence of viral mutation(s)  within the areas targeted by this assay, and inadequate number of viral copies(<138 copies/mL). A negative result must be combined with clinical observations, patient history, and epidemiological information. The expected result is Negative.  Fact Sheet for Patients:  BloggerCourse.com  Fact Sheet for Healthcare Providers:  SeriousBroker.it  This test is no t yet approved or cleared by the [place] FDA and  has been authorized for detection and/or diagnosis of SARS-CoV-2 by FDA under an Emergency Use Authorization (EUA). This EUA will remain  in effect (meaning this test can be used) for the duration of the COVID-19 declaration under Section 564(b)(1) of the Act, 21 U.S.C.section 360bbb-3(b)(1), unless the authorization is terminated  or revoked sooner.       Influenza A by PCR NEGATIVE NEGATIVE Final   Influenza B by PCR NEGATIVE NEGATIVE Final    Comment: (NOTE) The Xpert Xpress SARS-CoV-2/FLU/RSV plus assay is intended as an aid in the diagnosis of influenza from Nasopharyngeal swab specimens and should not be used as a sole basis for treatment. Nasal washings and aspirates are unacceptable for Xpert Xpress SARS-CoV-2/FLU/RSV testing.  Fact Sheet for Patients: BloggerCourse.com  Fact Sheet for Healthcare Providers: SeriousBroker.it  This test is not yet approved or cleared by the [place] FDA and has been authorized for detection and/or diagnosis of SARS-CoV-2 by FDA under an Emergency Use Authorization (EUA). This EUA will remain in effect (meaning this test can be used) for the duration of the COVID-19 declaration under Section 564(b)(1) of the Act, 21 U.S.C. section 360bbb-3(b)(1), unless the authorization is terminated or revoked.  Performed at [facility], [address], [place], [place] [address]   Culture, blood (Routine X 2) w  Reflex to ID Panel     Status: None (Preliminary result)   Collection Time: 10/03/22  4:51 AM   Specimen: BLOOD RIGHT ARM  Result Value Ref Range Status   Specimen Description   Final    BLOOD RIGHT ARM Performed at [facility], [address] [place], [place] [address]    Special Requests   Final    BOTTLES DRAWN AEROBIC ONLY Blood Culture results may not be optimal due to an inadequate volume of blood received in culture bottles Performed at [facility], [address] [place], [place] [address]    Culture   Final    NO GROWTH < 24 HOURS Performed at [facility], [address] [place], [place] [address]    Report Status PENDING  Incomplete  Culture, blood (Routine X 2) w Reflex to ID Panel     Status: None (Preliminary result)   Collection Time: 10/03/22  4:51 AM   Specimen: BLOOD LEFT ARM  Result Value Ref Range Status   Specimen Description   Final    BLOOD LEFT ARM Performed at [facility], [address] [place], [place] [address]    Special  Requests   Final    BOTTLES DRAWN AEROBIC ONLY Blood Culture adequate volume Performed at [facility], [address] [place], [place] [address]    Culture   Final    NO GROWTH < 24 HOURS Performed at [facility], [address] [place], [place] [address]    Report Status PENDING  Incomplete  Urine Culture     Status: None   Collection Time: 10/03/22  4:10 PM   Specimen: Urine, Clean Catch  Result Value Ref Range Status   Specimen Description   Final    URINE, CLEAN CATCH Performed at [facility], [address] [place], [place] [address]    Special Requests   Final    Normal Performed at [facility], [address], [place], [place] [address]    Culture   Final    NO GROWTH Performed at [facility], [address] [place], [place] [address]    Report Status 10/04/2022 FINAL   Final    Studies/Results: DG Chest Port 1 View  Result Date: 10/03/2022 CLINICAL DATA:  Shortness of breath. EXAM: PORTABLE CHEST 1 VIEW COMPARISON:  10/01/2022. FINDINGS: Heart is enlarged the mediastinal contour is stable. The pulmonary vasculature is mildly distended. Lung volumes are low. No consolidation, effusion, or pneumothorax. No acute osseous abnormality. IMPRESSION: Cardiomegaly with mild pulmonary vascular congestion. Electronically Signed   By: Thornell Sartorius M.D.   On: 10/03/2022 03:10    Medications: Scheduled Meds:  folic acid  1 mg Oral Daily   HYDROmorphone   Intravenous Q4H   ipratropium-albuterol  3 mL Nebulization TID   montelukast  10 mg Oral QHS   senna-docusate  1 tablet Oral QHS   Continuous Infusions:  ceFEPime (MAXIPIME) IV 2 g (10/04/22 1457)   PRN Meds:.acetaminophen, albuterol, diphenhydrAMINE, guaiFENesin-dextromethorphan, naLOXone (NARCAN)  injection, ondansetron (ZOFRAN) IV, mouth rinse, oxyCODONE, polyethylene glycol  Consultants: none  Procedures: none  Antibiotics: IV cefepime  Assessment/Plan: Principal Problem:   Sickle cell pain crisis (HCC) Active Problems:   Anemia of chronic disease   Leukocytosis   Chronic pain syndrome   Hypocalcemia   SIRS: Patient afebrile overnight.   Blood cultures negative so far. Procalcitonin and lactic acid negative.  Urine culture shows no growth.  We will continue to monitor closely and consider de-escalating antibiotics in AM.  Mild intermittent asthma: Patient complaining of increased shortness of breath. Home medications as needed.  Reassess in AM.   Sickle cell disease with pain crisis: Continue IV Dilaudid PCA without any changes today Toradol 15 mg IV every 6 hours Oxycodone 10 mg every 4 hours as needed for severe breakthrough pain Monitor vital signs very closely, reevaluate pain scale regularly, and supplemental oxygen as needed  Anemia of chronic disease: Patient's hemoglobin is  stable and consistent with his baseline.  There is no clinical indication for blood transfusion at this time.  We will monitor closely.  Chronic pain syndrome: Continue home medications  Leukocytosis: Stable.  Refer to above.  Monitor closely.  Labs in AM.  Code Status: Full Code Family Communication: N/A Disposition Plan: Not yet ready for discharge  Nolon Nations  APRN, MSN, FNP-C [facility] [address] [place], [place] [address] [phone]  If 7PM-7AM, please contact night-coverage.  10/04/2022, 9:35 PM  LOS: 2 days

## 2022-10-04 NOTE — Plan of Care (Signed)
Pt reporting pain 8/10 this am.  PCA use encouraged.  No BM since admission per pt.  Problem: Education: Goal: Knowledge of vaso-occlusive preventative measures will improve Outcome: Progressing Goal: Awareness of infection prevention will improve Outcome: Progressing Goal: Awareness of signs and symptoms of anemia will improve Outcome: Progressing Goal: Long-term complications will improve Outcome: Progressing   Problem: Self-Care: Goal: Ability to incorporate actions that prevent/reduce pain crisis will improve Outcome: Progressing   Problem: Bowel/Gastric: Goal: Gut motility will be maintained Outcome: Progressing   Problem: Tissue Perfusion: Goal: Complications related to inadequate tissue perfusion will be avoided or minimized Outcome: Progressing   Problem: Respiratory: Goal: Pulmonary complications will be avoided or minimized Outcome: Progressing Goal: Acute Chest Syndrome will be identified early to prevent complications Outcome: Progressing   Problem: Fluid Volume: Goal: Ability to maintain a balanced intake and output will improve Outcome: Progressing   Problem: Sensory: Goal: Pain level will decrease with appropriate interventions Outcome: Progressing   Problem: Health Behavior: Goal: Postive changes in compliance with treatment and prescription regimens will improve Outcome: Progressing   Problem: Education: Goal: Knowledge of General Education information will improve Description: Including pain rating scale, medication(s)/side effects and non-pharmacologic comfort measures Outcome: Progressing   Problem: Health Behavior/Discharge Planning: Goal: Ability to manage health-related needs will improve Outcome: Progressing   Problem: Clinical Measurements: Goal: Ability to maintain clinical measurements within normal limits will improve Outcome: Progressing Goal: Will remain free from infection Outcome: Progressing Goal: Diagnostic test results will  improve Outcome: Progressing Goal: Respiratory complications will improve Outcome: Progressing Goal: Cardiovascular complication will be avoided Outcome: Progressing   Problem: Activity: Goal: Risk for activity intolerance will decrease Outcome: Progressing   Problem: Nutrition: Goal: Adequate nutrition will be maintained Outcome: Progressing   Problem: Coping: Goal: Level of anxiety will decrease Outcome: Progressing   Problem: Elimination: Goal: Will not experience complications related to bowel motility Outcome: Progressing Goal: Will not experience complications related to urinary retention Outcome: Progressing   Problem: Pain Managment: Goal: General experience of comfort will improve Outcome: Progressing   Problem: Safety: Goal: Ability to remain free from injury will improve Outcome: Progressing   Problem: Skin Integrity: Goal: Risk for impaired skin integrity will decrease Outcome: Progressing
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
## 2022-10-05 LAB — CBC WITH DIFFERENTIAL/PLATELET
Abs Immature Granulocytes: 0.17 10*3/uL — ABNORMAL HIGH (ref 0.00–0.07)
Basophils Absolute: 0.1 10*3/uL (ref 0.0–0.1)
Basophils Relative: 0 %
Eosinophils Absolute: 0.1 10*3/uL (ref 0.0–0.5)
Eosinophils Relative: 1 %
HCT: 23.1 % — ABNORMAL LOW (ref 39.0–52.0)
Hemoglobin: 8.1 g/dL — ABNORMAL LOW (ref 13.0–17.0)
Immature Granulocytes: 1 %
Lymphocytes Relative: 18 %
Lymphs Abs: 3.3 10*3/uL (ref 0.7–4.0)
MCH: 28.6 pg (ref 26.0–34.0)
MCHC: 35.1 g/dL (ref 30.0–36.0)
MCV: 81.6 fL (ref 80.0–100.0)
Monocytes Absolute: 2.6 10*3/uL — ABNORMAL HIGH (ref 0.1–1.0)
Monocytes Relative: 14 %
Neutro Abs: 12.1 10*3/uL — ABNORMAL HIGH (ref 1.7–7.7)
Neutrophils Relative %: 66 %
Platelets: 396 10*3/uL (ref 150–400)
RBC: 2.83 MIL/uL — ABNORMAL LOW (ref 4.22–5.81)
RDW: 18 % — ABNORMAL HIGH (ref 11.5–15.5)
WBC: 18.4 10*3/uL — ABNORMAL HIGH (ref 4.0–10.5)
nRBC: 1 % — ABNORMAL HIGH (ref 0.0–0.2)

## 2022-10-05 LAB — BASIC METABOLIC PANEL
Anion gap: 6 (ref 5–15)
BUN: 7 mg/dL (ref 6–20)
CO2: 28 mmol/L (ref 22–32)
Calcium: 8.4 mg/dL — ABNORMAL LOW (ref 8.9–10.3)
Chloride: 98 mmol/L (ref 98–111)
Creatinine, Ser: 0.6 mg/dL — ABNORMAL LOW (ref 0.61–1.24)
GFR, Estimated: >60 mL/min (ref >60)
Glucose, Bld: 107 mg/dL — ABNORMAL HIGH (ref 70–99)
Potassium: 3.9 mmol/L (ref 3.5–5.1)
Sodium: 132 mmol/L — ABNORMAL LOW (ref 135–145)

## 2022-10-05 MED ORDER — METHYLPREDNISOLONE SODIUM SUCC 40 MG IJ SOLR
40.0000 mg | Freq: Two times a day (BID) | INTRAMUSCULAR | Status: DC
  Administered 2022-10-05 – 2022-10-06 (×2): 40 mg via INTRAVENOUS
  Filled 2022-10-05 (×2): qty 1

## 2022-10-05 MED ORDER — KETOROLAC TROMETHAMINE 15 MG/ML IJ SOLN
15.0000 mg | Freq: Four times a day (QID) | INTRAMUSCULAR | Status: DC
  Administered 2022-10-05 – 2022-10-08 (×11): 15 mg via INTRAVENOUS
  Filled 2022-10-05 (×11): qty 1

## 2022-10-05 NOTE — Progress Notes (Signed)
Patient ID: [name] Hill, male   DOB: May 16, 2001, 21 y.o.   MRN: [id number] Subjective: [name] Hill is a 21 year old male with medical history significant for sickle cell disease, chronic pain syndrome, opiate dependence and tolerance was admitted for sickle cell pain crisis.   Patient continues to complain of significant pain, all over.  He said it is difficult to breathe especially during ambulation.  Patient has remained afebrile.  Oxygen saturation ranges between 92 to 98% on 2 L of oxygen.  He denies any headache, nausea, vomiting or diarrhea.  No urinary symptoms.  Objective:  Vital signs in last 24 hours:  Vitals:   10/05/22 0437 10/05/22 0438 10/05/22 0800 10/05/22 0851  BP: 134/81     Pulse: 95     Resp: 16 18 16    Temp: 99 F (37.2 C)     TempSrc: Oral     SpO2: 98% 95% 92% 93%  Weight:      Height:        Intake/Output from previous day:   Intake/Output Summary (Last 24 hours) at 10/05/2022 1235 Last data filed at 10/05/2022 0300 Gross per 24 hour  Intake 840 ml  Output 1700 ml  Net -860 ml    Physical Exam: General: Alert, awake, oriented x3, in no acute distress.  HEENT: [place]/AT PEERL, EOMI Neck: Trachea midline,  no masses, no thyromegal,y no JVD, no carotid bruit OROPHARYNX:  Moist, No exudate/ erythema/lesions.  Heart: Regular rate and rhythm, without murmurs, rubs, gallops, PMI non-displaced, no heaves or thrills on palpation.  Lungs: Clear to auscultation, no wheezing or rhonchi noted. No increased vocal fremitus resonant to percussion  Abdomen: Soft, nontender, nondistended, positive bowel sounds, no masses no hepatosplenomegaly noted..  Neuro: No focal neurological deficits noted cranial nerves II through XII grossly intact. DTRs 2+ bilaterally upper and lower extremities. Strength 5 out of 5 in bilateral upper and lower extremities. Musculoskeletal: No warm swelling or erythema around joints, no spinal tenderness noted. Psychiatric: Patient alert and  oriented x3, good insight and cognition, good recent to remote recall. Lymph node survey: No cervical axillary or inguinal lymphadenopathy noted.  Lab Results:  Basic Metabolic Panel:    Component Value Date/Time   NA 132 (L) 10/05/2022 0636   NA 137 04/16/2022 1413   K 3.9 10/05/2022 0636   CL 98 10/05/2022 0636   CO2 28 10/05/2022 0636   BUN 7 10/05/2022 0636   BUN 6 04/16/2022 1413   CREATININE 0.60 (L) 10/05/2022 0636   GLUCOSE 107 (H) 10/05/2022 0636   CALCIUM 8.4 (L) 10/05/2022 0636   CBC:    Component Value Date/Time   WBC 18.4 (H) 10/05/2022 0636   HGB 8.1 (L) 10/05/2022 0636   HGB 11.1 (L) 04/16/2022 1413   HCT 23.1 (L) 10/05/2022 0636   HCT 33.4 (L) 04/16/2022 1413   PLT 396 10/05/2022 0636   PLT 760 (H) 04/16/2022 1413   MCV 81.6 10/05/2022 0636   MCV 86 04/16/2022 1413   NEUTROABS 12.1 (H) 10/05/2022 0636   NEUTROABS 7.7 (H) 04/16/2022 1413   LYMPHSABS 3.3 10/05/2022 0636   LYMPHSABS 2.8 04/16/2022 1413   MONOABS 2.6 (H) 10/05/2022 0636   EOSABS 0.1 10/05/2022 0636   EOSABS 0.2 04/16/2022 1413   BASOSABS 0.1 10/05/2022 0636   BASOSABS 0.1 04/16/2022 1413    Recent Results (from the past 240 hour(s))  Resp Panel by RT-PCR (Flu A&B, Covid) Anterior Nasal Swab     Status: None   Collection  Time: 10/03/22  2:54 AM   Specimen: Anterior Nasal Swab  Result Value Ref Range Status   SARS Coronavirus 2 by RT PCR NEGATIVE NEGATIVE Final    Comment: (NOTE) SARS-CoV-2 target nucleic acids are NOT DETECTED.  The SARS-CoV-2 RNA is generally detectable in upper respiratory specimens during the acute phase of infection. The lowest concentration of SARS-CoV-2 viral copies this assay can detect is 138 copies/mL. A negative result does not preclude SARS-Cov-2 infection and should not be used as the sole basis for treatment or other patient management decisions. A negative result may occur with  improper specimen collection/handling, submission of specimen other than  nasopharyngeal swab, presence of viral mutation(s) within the areas targeted by this assay, and inadequate number of viral copies(<138 copies/mL). A negative result must be combined with clinical observations, patient history, and epidemiological information. The expected result is Negative.  Fact Sheet for Patients:  BloggerCourse.com  Fact Sheet for Healthcare Providers:  SeriousBroker.it  This test is no t yet approved or cleared by the [place] FDA and  has been authorized for detection and/or diagnosis of SARS-CoV-2 by FDA under an Emergency Use Authorization (EUA). This EUA will remain  in effect (meaning this test can be used) for the duration of the COVID-19 declaration under Section 564(b)(1) of the Act, 21 U.S.C.section 360bbb-3(b)(1), unless the authorization is terminated  or revoked sooner.       Influenza A by PCR NEGATIVE NEGATIVE Final   Influenza B by PCR NEGATIVE NEGATIVE Final    Comment: (NOTE) The Xpert Xpress SARS-CoV-2/FLU/RSV plus assay is intended as an aid in the diagnosis of influenza from Nasopharyngeal swab specimens and should not be used as a sole basis for treatment. Nasal washings and aspirates are unacceptable for Xpert Xpress SARS-CoV-2/FLU/RSV testing.  Fact Sheet for Patients: BloggerCourse.com  Fact Sheet for Healthcare Providers: SeriousBroker.it  This test is not yet approved or cleared by the [place] FDA and has been authorized for detection and/or diagnosis of SARS-CoV-2 by FDA under an Emergency Use Authorization (EUA). This EUA will remain in effect (meaning this test can be used) for the duration of the COVID-19 declaration under Section 564(b)(1) of the Act, 21 U.S.C. section 360bbb-3(b)(1), unless the authorization is terminated or revoked.  Performed at [facility], [address], [place], [place] [address]   Culture, blood (Routine X 2) w Reflex to ID Panel     Status: None (Preliminary result)   Collection Time: 10/03/22  4:51 AM   Specimen: BLOOD RIGHT ARM  Result Value Ref Range Status   Specimen Description   Final    BLOOD RIGHT ARM Performed at [facility], [address] [place], [place] [address]    Special Requests   Final    BOTTLES DRAWN AEROBIC ONLY Blood Culture results may not be optimal due to an inadequate volume of blood received in culture bottles Performed at [facility], [address], [place], [place] [address]    Culture   Final    NO GROWTH 2 DAYS Performed at [facility], [address], [place], [place] [address]    Report Status PENDING  Incomplete  Culture, blood (Routine X 2) w Reflex to ID Panel     Status: None (Preliminary result)   Collection Time: 10/03/22  4:51 AM   Specimen: BLOOD LEFT ARM  Result Value Ref Range Status   Specimen Description   Final    BLOOD LEFT  ARM Performed at [facility], [address] [place], [place] [address]    Special Requests   Final    BOTTLES DRAWN AEROBIC ONLY Blood Culture adequate volume Performed at [facility], [address], [place], [place] [address]    Culture   Final    NO GROWTH 2 DAYS Performed at [facility], [address] [place], [place] [address]    Report Status PENDING  Incomplete  Urine Culture     Status: None   Collection Time: 10/03/22  4:10 PM   Specimen: Urine, Clean Catch  Result Value Ref Range Status   Specimen Description   Final    URINE, CLEAN CATCH Performed at [facility], [address], [place], [place] [address]    Special Requests   Final    Normal Performed at [facility], [address] [place], [place] [address]    Culture   Final    NO GROWTH Performed at [facility], [address],  [place], [place] [address]    Report Status 10/04/2022 FINAL  Final    Studies/Results: No results found.  Medications: Scheduled Meds:  folic acid  1 mg Oral Daily   HYDROmorphone   Intravenous Q4H   ipratropium-albuterol  3 mL Nebulization TID   montelukast  10 mg Oral QHS   senna-docusate  1 tablet Oral QHS   Continuous Infusions:  ceFEPime (MAXIPIME) IV 2 g (10/05/22 0558)   PRN Meds:.acetaminophen, albuterol, diphenhydrAMINE, guaiFENesin-dextromethorphan, naLOXone (NARCAN)  injection, ondansetron (ZOFRAN) IV, mouth rinse, oxyCODONE, polyethylene glycol  Consultants: None  Procedures: None  Antibiotics: IV cefepime   Assessment/Plan: Principal Problem:   Sickle cell pain crisis (HCC) Active Problems:   Anemia of chronic disease   Leukocytosis   Chronic pain syndrome   Hypocalcemia  SIRS: Patient remains afebrile. Blood cultures negative.  Urine culture also negative. Will discontinue antibiotics today. Hb Sickle Cell Disease with Pain crisis: Continue IVF at [facility], continue weight based Dilaudid PCA at current dose setting, continue IV Toradol 15 mg Q 6 H for total of 5 days, continue oral home pain medications as ordered.  Monitor vitals very closely, Re-evaluate pain scale regularly, 2 L of Oxygen by [place]. Leukocytosis: Stable.  Patient has received antibiotics.  Probably demargination from sickle cell pain crisis.  Antibiotic is being discontinued for reasons stated above. Anemia of Chronic Disease: Hemoglobin appears stable at baseline today.  There is no clinical indication for blood transfusion at this time.  We will continue to monitor very closely, repeat labs in AM. Chronic pain Syndrome: Continue home pain medications as ordered. Mild intermittent asthma: Patient continues to endorse increased shortness of breath. Encouraged to use incentive spirometry, ambulate in the hallway. We will add steroid for short-term use.  Continue inhalers as ordered.  Code Status: Full  Code Family Communication: N/A Disposition Plan: Not yet ready for discharge  Jeryl Umholtz  If 7PM-7AM, please contact night-coverage.  10/05/2022, 12:35 PM  LOS: 3 days

## 2022-10-06 MED ORDER — METHYLPREDNISOLONE SODIUM SUCC 125 MG IJ SOLR
80.0000 mg | INTRAMUSCULAR | Status: AC
  Administered 2022-10-06: 80 mg via INTRAVENOUS
  Filled 2022-10-06: qty 2

## 2022-10-06 NOTE — Progress Notes (Signed)
Patient ID: [name] Hill, male   DOB: 05/24/2001, 21 y.o.   MRN: [id number] Subjective: [name] Hill is a 21 year old male with medical history significant for sickle cell disease, chronic pain syndrome, opiate dependence and tolerance was admitted for sickle cell pain crisis.   Patient said he feels much better today.  Pain is now between 6 and 7/10, still short of breath occasionally but much improved.  He desaturated to low 90s without oxygen this morning, back on 1 L of oxygen by nasal cannula.  He denies any fever, headache, nausea, vomiting or diarrhea.  No urinary symptoms.  Objective:  Vital signs in last 24 hours:  Vitals:   10/06/22 0754 10/06/22 1029 10/06/22 1335 10/06/22 1345  BP:  128/79  125/71  Pulse:  96  98  Resp:  18  18  Temp:  98.7 F (37.1 C)  98.5 F (36.9 C)  TempSrc:  Oral  Oral  SpO2: 99% 95% 95% 92%  Weight:      Height:        Intake/Output from previous day:   Intake/Output Summary (Last 24 hours) at 10/06/2022 1348 Last data filed at 10/06/2022 0900 Gross per 24 hour  Intake 785.5 ml  Output 2000 ml  Net -1214.5 ml     Physical Exam: General: Alert, awake, oriented x3, in no acute distress.  HEENT: [place]/AT PEERL, EOMI Neck: Trachea midline,  no masses, no thyromegal,y no JVD, no carotid bruit OROPHARYNX:  Moist, No exudate/ erythema/lesions.  Heart: Regular rate and rhythm, without murmurs, rubs, gallops, PMI non-displaced, no heaves or thrills on palpation.  Lungs: Clear to auscultation, no wheezing or rhonchi noted. No increased vocal fremitus resonant to percussion  Abdomen: Soft, nontender, nondistended, positive bowel sounds, no masses no hepatosplenomegaly noted..  Neuro: No focal neurological deficits noted cranial nerves II through XII grossly intact. DTRs 2+ bilaterally upper and lower extremities. Strength 5 out of 5 in bilateral upper and lower extremities. Musculoskeletal: No warm swelling or erythema around joints, no spinal  tenderness noted. Psychiatric: Patient alert and oriented x3, good insight and cognition, good recent to remote recall. Lymph node survey: No cervical axillary or inguinal lymphadenopathy noted.  Lab Results:  Basic Metabolic Panel:    Component Value Date/Time   NA 132 (L) 10/05/2022 0636   NA 137 04/16/2022 1413   K 3.9 10/05/2022 0636   CL 98 10/05/2022 0636   CO2 28 10/05/2022 0636   BUN 7 10/05/2022 0636   BUN 6 04/16/2022 1413   CREATININE 0.60 (L) 10/05/2022 0636   GLUCOSE 107 (H) 10/05/2022 0636   CALCIUM 8.4 (L) 10/05/2022 0636   CBC:    Component Value Date/Time   WBC 18.4 (H) 10/05/2022 0636   HGB 8.1 (L) 10/05/2022 0636   HGB 11.1 (L) 04/16/2022 1413   HCT 23.1 (L) 10/05/2022 0636   HCT 33.4 (L) 04/16/2022 1413   PLT 396 10/05/2022 0636   PLT 760 (H) 04/16/2022 1413   MCV 81.6 10/05/2022 0636   MCV 86 04/16/2022 1413   NEUTROABS 12.1 (H) 10/05/2022 0636   NEUTROABS 7.7 (H) 04/16/2022 1413   LYMPHSABS 3.3 10/05/2022 0636   LYMPHSABS 2.8 04/16/2022 1413   MONOABS 2.6 (H) 10/05/2022 0636   EOSABS 0.1 10/05/2022 0636   EOSABS 0.2 04/16/2022 1413   BASOSABS 0.1 10/05/2022 0636   BASOSABS 0.1 04/16/2022 1413    Recent Results (from the past 240 hour(s))  Resp Panel by RT-PCR (Flu A&B, Covid) Anterior Nasal Swab  Status: None   Collection Time: 10/03/22  2:54 AM   Specimen: Anterior Nasal Swab  Result Value Ref Range Status   SARS Coronavirus 2 by RT PCR NEGATIVE NEGATIVE Final    Comment: (NOTE) SARS-CoV-2 target nucleic acids are NOT DETECTED.  The SARS-CoV-2 RNA is generally detectable in upper respiratory specimens during the acute phase of infection. The lowest concentration of SARS-CoV-2 viral copies this assay can detect is 138 copies/mL. A negative result does not preclude SARS-Cov-2 infection and should not be used as the sole basis for treatment or other patient management decisions. A negative result may occur with  improper specimen  collection/handling, submission of specimen other than nasopharyngeal swab, presence of viral mutation(s) within the areas targeted by this assay, and inadequate number of viral copies(<138 copies/mL). A negative result must be combined with clinical observations, patient history, and epidemiological information. The expected result is Negative.  Fact Sheet for Patients:  BloggerCourse.com  Fact Sheet for Healthcare Providers:  SeriousBroker.it  This test is no t yet approved or cleared by the [place] FDA and  has been authorized for detection and/or diagnosis of SARS-CoV-2 by FDA under an Emergency Use Authorization (EUA). This EUA will remain  in effect (meaning this test can be used) for the duration of the COVID-19 declaration under Section 564(b)(1) of the Act, 21 U.S.C.section 360bbb-3(b)(1), unless the authorization is terminated  or revoked sooner.       Influenza A by PCR NEGATIVE NEGATIVE Final   Influenza B by PCR NEGATIVE NEGATIVE Final    Comment: (NOTE) The Xpert Xpress SARS-CoV-2/FLU/RSV plus assay is intended as an aid in the diagnosis of influenza from Nasopharyngeal swab specimens and should not be used as a sole basis for treatment. Nasal washings and aspirates are unacceptable for Xpert Xpress SARS-CoV-2/FLU/RSV testing.  Fact Sheet for Patients: BloggerCourse.com  Fact Sheet for Healthcare Providers: SeriousBroker.it  This test is not yet approved or cleared by the [place] FDA and has been authorized for detection and/or diagnosis of SARS-CoV-2 by FDA under an Emergency Use Authorization (EUA). This EUA will remain in effect (meaning this test can be used) for the duration of the COVID-19 declaration under Section 564(b)(1) of the Act, 21 U.S.C. section 360bbb-3(b)(1), unless the authorization is terminated or revoked.  Performed at [facility], [address], [place], [place] [address]   Culture, blood (Routine X 2) w Reflex to ID Panel     Status: None (Preliminary result)   Collection Time: 10/03/22  4:51 AM   Specimen: BLOOD RIGHT ARM  Result Value Ref Range Status   Specimen Description   Final    BLOOD RIGHT ARM Performed at [facility], [address], [place], [place] [address]    Special Requests   Final    BOTTLES DRAWN AEROBIC ONLY Blood Culture results may not be optimal due to an inadequate volume of blood received in culture bottles Performed at [facility], [address], [place], [place] [address]    Culture   Final    NO GROWTH 3 DAYS Performed at [facility], [address] [place], [place] [address]    Report Status PENDING  Incomplete  Culture, blood (Routine X 2) w Reflex to ID Panel     Status: None (Preliminary result)   Collection Time: 10/03/22  4:51 AM   Specimen: BLOOD LEFT ARM  Result Value Ref Range Status   Specimen Description   Final  BLOOD LEFT ARM Performed at [facility], [address] [place], [place] [address]    Special Requests   Final    BOTTLES DRAWN AEROBIC ONLY Blood Culture adequate volume Performed at [facility], [address], [place], [place] [address]    Culture   Final    NO GROWTH 3 DAYS Performed at [facility], [address] [place], [place] [address]    Report Status PENDING  Incomplete  Urine Culture     Status: None   Collection Time: 10/03/22  4:10 PM   Specimen: Urine, Clean Catch  Result Value Ref Range Status   Specimen Description   Final    URINE, CLEAN CATCH Performed at [facility], [address] [place], [place] [address]    Special Requests   Final    Normal Performed at [facility], [address], [place], [place] [address]    Culture   Final    NO GROWTH Performed at  [facility], [address] [place], [place] [address]    Report Status 10/04/2022 FINAL  Final    Studies/Results: No results found.  Medications: Scheduled Meds:  folic acid  1 mg Oral Daily   HYDROmorphone   Intravenous Q4H   ipratropium-albuterol  3 mL Nebulization TID   ketorolac  15 mg Intravenous Q6H   methylPREDNISolone (SOLU-MEDROL) injection  80 mg Intravenous Q24H   montelukast  10 mg Oral QHS   senna-docusate  1 tablet Oral QHS   Continuous Infusions:   PRN Meds:.acetaminophen, albuterol, diphenhydrAMINE, guaiFENesin-dextromethorphan, naLOXone (NARCAN)  injection, ondansetron (ZOFRAN) IV, mouth rinse, oxyCODONE, polyethylene glycol  Consultants: None  Procedures: None  Antibiotics: IV cefepime discontinued.  Assessment/Plan: Principal Problem:   Sickle cell pain crisis (HCC) Active Problems:   Anemia of chronic disease   Leukocytosis   Chronic pain syndrome   Hypocalcemia  SIRS: Resolved.  Patient remains afebrile. Blood cultures negative. Urine culture also negative.  Antibiotics discontinued.   Hb Sickle Cell Disease with Pain crisis: IVF at [facility], continue weight based Dilaudid PCA at current dose setting, continue IV Toradol 15 mg Q 6 H for total of 5 days, continue oral home pain medications as ordered.  Monitor vitals very closely, Re-evaluate pain scale regularly, Oxygen by [place] to maintain pulse ox greater than 92%. Leukocytosis: Stable.  Antibiotic discontinued.  We will continue to monitor very closely. Anemia of Chronic Disease: Hemoglobin continues to be stable at baseline today. There is no clinical indication for blood transfusion at this time. We will continue to monitor very closely, repeat labs in AM. Chronic pain Syndrome: Continue home pain medications as ordered. Mild intermittent asthma: Patient is much better today.  He has had 2 doses of IV Solu-Medrol as ordered yesterday.  We will continue for 2 more doses and then change to p.o.  prednisone taper. Encouraged to use incentive spirometry, ambulate in the hallway. Continue inhalers as ordered.  Code Status: Full Code Family Communication: N/A Disposition Plan: Not yet ready for discharge  [name] Hill  If 7PM-7AM, please contact night-coverage.  10/06/2022, 1:48 PM  LOS: 4 days

## 2022-10-06 NOTE — Progress Notes (Signed)
Attempts to wean pt off oxygen.  At 1 liter for one hour pt saturations were 85-88% resting.  Increased oxygen to 2.5 Liters, now pt saturations at 93%.

## 2022-10-07 LAB — BASIC METABOLIC PANEL
Anion gap: 11 (ref 5–15)
BUN: 22 mg/dL — ABNORMAL HIGH (ref 6–20)
CO2: 28 mmol/L (ref 22–32)
Calcium: 8.7 mg/dL — ABNORMAL LOW (ref 8.9–10.3)
Chloride: 102 mmol/L (ref 98–111)
Creatinine, Ser: 0.59 mg/dL — ABNORMAL LOW (ref 0.61–1.24)
GFR, Estimated: >60 mL/min (ref >60)
Glucose, Bld: 96 mg/dL (ref 70–99)
Potassium: 4.3 mmol/L (ref 3.5–5.1)
Sodium: 141 mmol/L (ref 135–145)

## 2022-10-07 LAB — CBC WITH DIFFERENTIAL/PLATELET
Abs Immature Granulocytes: 0.13 10*3/uL — ABNORMAL HIGH (ref 0.00–0.07)
Basophils Absolute: 0 10*3/uL (ref 0.0–0.1)
Basophils Relative: 0 %
Eosinophils Absolute: 0 10*3/uL (ref 0.0–0.5)
Eosinophils Relative: 0 %
HCT: 22.4 % — ABNORMAL LOW (ref 39.0–52.0)
Hemoglobin: 7.8 g/dL — ABNORMAL LOW (ref 13.0–17.0)
Immature Granulocytes: 1 %
Lymphocytes Relative: 23 %
Lymphs Abs: 5 10*3/uL — ABNORMAL HIGH (ref 0.7–4.0)
MCH: 28.8 pg (ref 26.0–34.0)
MCHC: 34.8 g/dL (ref 30.0–36.0)
MCV: 82.7 fL (ref 80.0–100.0)
Monocytes Absolute: 2.9 10*3/uL — ABNORMAL HIGH (ref 0.1–1.0)
Monocytes Relative: 13 %
Neutro Abs: 14.3 10*3/uL — ABNORMAL HIGH (ref 1.7–7.7)
Neutrophils Relative %: 63 %
Platelets: 478 10*3/uL — ABNORMAL HIGH (ref 150–400)
RBC: 2.71 MIL/uL — ABNORMAL LOW (ref 4.22–5.81)
RDW: 18.8 % — ABNORMAL HIGH (ref 11.5–15.5)
WBC: 22.3 10*3/uL — ABNORMAL HIGH (ref 4.0–10.5)
nRBC: 0.4 % — ABNORMAL HIGH (ref 0.0–0.2)

## 2022-10-07 MED ORDER — IPRATROPIUM-ALBUTEROL 0.5-2.5 (3) MG/3ML IN SOLN
3.0000 mL | Freq: Two times a day (BID) | RESPIRATORY_TRACT | Status: DC
  Administered 2022-10-07 – 2022-10-08 (×2): 3 mL via RESPIRATORY_TRACT
  Filled 2022-10-07 (×2): qty 3

## 2022-10-07 NOTE — Progress Notes (Addendum)
Subjective: [name] Hill is a 21 year old male with medical history significant for sickle cell disease, chronic pain syndrome, opiate dependence and tolerance was admitted for sickle cell pain crisis. Patient states that chest pain has improved with IV steroids.  He still endorses some shortness of breath with ambulation. Patient continues oxygen at 2 L/min.  Patient afebrile overnight.   He rates his pain as 7/10.  He denies headache, fatigue, urinary symptoms, dizziness, nausea, vomiting, or diarrhea.  Objective:  Vital signs in last 24 hours:  Vitals:   10/07/22 0535 10/07/22 0734 10/07/22 1137 10/07/22 1338  BP: 122/69  114/67 122/73  Pulse: (!) 59  81 83  Resp: 17  20 (!) 21  Temp: (!) 97.5 F (36.4 C)  98.3 F (36.8 C) 98.2 F (36.8 C)  TempSrc: Oral  Oral Oral  SpO2: 98% 97% 90% 96%  Weight:      Height:        Intake/Output from previous day:   Intake/Output Summary (Last 24 hours) at 10/07/2022 1603 Last data filed at 10/07/2022 1227 Gross per 24 hour  Intake 1604 ml  Output 500 ml  Net 1104 ml    Physical Exam: General: Alert, awake, oriented x3, in no acute distress.  HEENT: /AT PEERL, EOMI Neck: Trachea midline,  no masses, no thyromegal,y no JVD, no carotid bruit OROPHARYNX:  Moist, No exudate/ erythema/lesions.  Heart: Regular rate and rhythm, without murmurs, rubs, gallops, PMI non-displaced, no heaves or thrills on palpation.  Lungs: Clear to auscultation, no wheezing or rhonchi noted. No increased vocal fremitus resonant to percussion  Abdomen: Soft, nontender, nondistended, positive bowel sounds, no masses no hepatosplenomegaly noted..  Neuro: No focal neurological deficits noted cranial nerves II through XII grossly intact. DTRs 2+ bilaterally upper and lower extremities. Strength 5 out of 5 in bilateral upper and lower extremities. Musculoskeletal: No warm swelling or erythema around joints, no spinal tenderness noted. Psychiatric: Patient alert and  oriented x3, good insight and cognition, good recent to remote recall. Lymph node survey: No cervical axillary or inguinal lymphadenopathy noted.  Lab Results:  Basic Metabolic Panel:    Component Value Date/Time   NA 141 10/07/2022 1058   NA 137 04/16/2022 1413   K 4.3 10/07/2022 1058   CL 102 10/07/2022 1058   CO2 28 10/07/2022 1058   BUN 22 (H) 10/07/2022 1058   BUN 6 04/16/2022 1413   CREATININE 0.59 (L) 10/07/2022 1058   GLUCOSE 96 10/07/2022 1058   CALCIUM 8.7 (L) 10/07/2022 1058   CBC:    Component Value Date/Time   WBC 22.3 (H) 10/07/2022 1058   HGB 7.8 (L) 10/07/2022 1058   HGB 11.1 (L) 04/16/2022 1413   HCT 22.4 (L) 10/07/2022 1058   HCT 33.4 (L) 04/16/2022 1413   PLT 478 (H) 10/07/2022 1058   PLT 760 (H) 04/16/2022 1413   MCV 82.7 10/07/2022 1058   MCV 86 04/16/2022 1413   NEUTROABS 14.3 (H) 10/07/2022 1058   NEUTROABS 7.7 (H) 04/16/2022 1413   LYMPHSABS 5.0 (H) 10/07/2022 1058   LYMPHSABS 2.8 04/16/2022 1413   MONOABS 2.9 (H) 10/07/2022 1058   EOSABS 0.0 10/07/2022 1058   EOSABS 0.2 04/16/2022 1413   BASOSABS 0.0 10/07/2022 1058   BASOSABS 0.1 04/16/2022 1413    Recent Results (from the past 240 hour(s))  Resp Panel by RT-PCR (Flu A&B, Covid) Anterior Nasal Swab     Status: None   Collection Time: 10/03/22  2:54 AM   Specimen: Anterior  Nasal Swab  Result Value Ref Range Status   SARS Coronavirus 2 by RT PCR NEGATIVE NEGATIVE Final    Comment: (NOTE) SARS-CoV-2 target nucleic acids are NOT DETECTED.  The SARS-CoV-2 RNA is generally detectable in upper respiratory specimens during the acute phase of infection. The lowest concentration of SARS-CoV-2 viral copies this assay can detect is 138 copies/mL. A negative result does not preclude SARS-Cov-2 infection and should not be used as the sole basis for treatment or other patient management decisions. A negative result may occur with  improper specimen collection/handling, submission of specimen  other than nasopharyngeal swab, presence of viral mutation(s) within the areas targeted by this assay, and inadequate number of viral copies(<138 copies/mL). A negative result must be combined with clinical observations, patient history, and epidemiological information. The expected result is Negative.  Fact Sheet for Patients:  BloggerCourse.com  Fact Sheet for Healthcare Providers:  SeriousBroker.it  This test is no t yet approved or cleared by the [place] FDA and  has been authorized for detection and/or diagnosis of SARS-CoV-2 by FDA under an Emergency Use Authorization (EUA). This EUA will remain  in effect (meaning this test can be used) for the duration of the COVID-19 declaration under Section 564(b)(1) of the Act, 21 U.S.C.section 360bbb-3(b)(1), unless the authorization is terminated  or revoked sooner.       Influenza A by PCR NEGATIVE NEGATIVE Final   Influenza B by PCR NEGATIVE NEGATIVE Final    Comment: (NOTE) The Xpert Xpress SARS-CoV-2/FLU/RSV plus assay is intended as an aid in the diagnosis of influenza from Nasopharyngeal swab specimens and should not be used as a sole basis for treatment. Nasal washings and aspirates are unacceptable for Xpert Xpress SARS-CoV-2/FLU/RSV testing.  Fact Sheet for Patients: BloggerCourse.com  Fact Sheet for Healthcare Providers: SeriousBroker.it  This test is not yet approved or cleared by the [place] FDA and has been authorized for detection and/or diagnosis of SARS-CoV-2 by FDA under an Emergency Use Authorization (EUA). This EUA will remain in effect (meaning this test can be used) for the duration of the COVID-19 declaration under Section 564(b)(1) of the Act, 21 U.S.C. section 360bbb-3(b)(1), unless the authorization is terminated or revoked.  Performed at [facility], [address], [place], [place] [address]   Culture, blood (Routine X 2) w Reflex to ID Panel     Status: None (Preliminary result)   Collection Time: 10/03/22  4:51 AM   Specimen: BLOOD RIGHT ARM  Result Value Ref Range Status   Specimen Description   Final    BLOOD RIGHT ARM Performed at [facility], [address], [place], [place] [address]    Special Requests   Final    BOTTLES DRAWN AEROBIC ONLY Blood Culture results may not be optimal due to an inadequate volume of blood received in culture bottles Performed at [facility], [address] [place], [place] [address]    Culture   Final    NO GROWTH 4 DAYS Performed at [facility], [address], [place], [place] [address]    Report Status PENDING  Incomplete  Culture, blood (Routine X 2) w Reflex to ID Panel     Status: None (Preliminary result)   Collection Time: 10/03/22  4:51 AM   Specimen: BLOOD LEFT ARM  Result Value Ref Range Status   Specimen Description   Final    BLOOD LEFT ARM Performed at [facility], [address] [place], [place] [address]    Special Requests   Final    BOTTLES DRAWN AEROBIC ONLY Blood Culture adequate volume Performed at [facility], [address], [place], [place] [address]    Culture   Final    NO GROWTH 4 DAYS Performed at [facility], [address] [place], [place] [address]    Report Status PENDING  Incomplete  Urine Culture     Status: None   Collection Time: 10/03/22  4:10 PM   Specimen: Urine, Clean Catch  Result Value Ref Range Status   Specimen Description   Final    URINE, CLEAN CATCH Performed at [facility], [address], [place], [place] [address]    Special Requests   Final    Normal Performed at [facility], [address], [place], [place] [address]    Culture   Final    NO GROWTH Performed at [facility], [address],  [place], [place] [address]    Report Status 10/04/2022 FINAL  Final    Studies/Results: No results found.  Medications: Scheduled Meds:  folic acid  1 mg Oral Daily   HYDROmorphone   Intravenous Q4H   ipratropium-albuterol  3 mL Nebulization BID   ketorolac  15 mg Intravenous Q6H   montelukast  10 mg Oral QHS   senna-docusate  1 tablet Oral QHS   Continuous Infusions:   PRN Meds:.acetaminophen, albuterol, diphenhydrAMINE, guaiFENesin-dextromethorphan, naLOXone (NARCAN)  injection, ondansetron (ZOFRAN) IV, mouth rinse, oxyCODONE, polyethylene glycol  Consultants: none  Procedures: none  Antibiotics: IV cefepime  Assessment/Plan: Principal Problem:   Sickle cell pain crisis (HCC) Active Problems:   Anemia of chronic disease   Leukocytosis   Chronic pain syndrome   Hypocalcemia   SIRS: Pat resolved.   Blood cultures negative so far. Procalcitonin and lactic acid negative.  Urine culture shows no growth.    Mild intermittent asthma: Improved with IV steroids.  Prednisone taper.  Continue incentive spirometry.     Sickle cell disease with pain crisis: Continue IV Dilaudid PCA without any changes today Toradol 15 mg IV every 6 hours Oxycodone 10 mg every 4 hours as needed for severe breakthrough pain Monitor vital signs very closely, reevaluate pain scale regularly, and supplemental oxygen as needed  Anemia of chronic disease: Patient's hemoglobin is stable and consistent with his baseline.  There is no clinical indication for blood transfusion at this time.  We will monitor closely.  Chronic pain syndrome: Continue home medications  Leukocytosis: Stable.  Refer to above.  Monitor closely.  Labs in AM.  Code Status: Full Code Family Communication: N/A Disposition Plan: Not yet ready for discharge  Nolon Nations  APRN, MSN, FNP-C [facility] [address] [place], [place] [address] [phone]  If 7PM-7AM,  please contact night-coverage.  10/07/2022, 4:03 PM  LOS: 5 days

## 2022-10-07 NOTE — Progress Notes (Signed)
SATURATION QUALIFICATIONS: (This note is used to comply with regulatory documentation for home oxygen)  Patient Saturations on Room Air at Rest = 90%  Patient Saturations on Room Air while Ambulating = 86%  Patient Saturations on 2 Liters of oxygen while Ambulating = 92%  Please briefly explain why patient needs home oxygen:  Pt dropped to 86% for most of the walk on RA. When 2L were added pts saturations were 92-93%. Pt did state he was SOB while walking

## 2022-10-08 LAB — CULTURE, BLOOD (ROUTINE X 2)
Culture: NO GROWTH
Culture: NO GROWTH
Special Requests: ADEQUATE

## 2022-10-08 MED ORDER — MONTELUKAST SODIUM 10 MG PO TABS: 10.0000 mg | ORAL_TABLET | Freq: Every day | ORAL | 5 refills | Status: DC

## 2022-10-08 MED ORDER — ALBUTEROL SULFATE HFA 108 (90 BASE) MCG/ACT IN AERS: 2.0000 | INHALATION_SPRAY | Freq: Four times a day (QID) | RESPIRATORY_TRACT | 2 refills | Status: AC | PRN

## 2022-10-08 NOTE — TOC Transition Note (Signed)
Transition of Care [facility]) - CM/SW Discharge Note   Patient Details  Name: [name] Hill MRN: [id number] Date of Birth: [birth year]  [facility] CM/SW Contact:  Beckie Busing, RN Phone Number:[phone]  10/08/2022, 10:07 AM   Clinical Narrative:    Patient with home O2 orders. Patient has no insurance listed so Home O2 has been ordered with [employer]. No other needs noted at this time.          Patient Goals and CMS Choice        Discharge Placement                       Discharge Plan and Services                                     Social Determinants of Health (SDOH) Interventions Food Insecurity Interventions: Intervention Not Indicated Housing Interventions: Intervention Not Indicated Transportation Interventions: Intervention Not Indicated Utilities Interventions: Intervention Not Indicated   Readmission Risk Interventions     No data to display

## 2022-10-08 NOTE — Progress Notes (Signed)
Pt has DC order. AVS was given and explained to pt, all questions has been answered. Pt's portable oxygen was delivered. Awaiting for grandmother to pick up the pt.

## 2022-10-08 NOTE — Discharge Summary (Signed)
Physician Discharge Summary  KEEVON HENNEY EXN:[id number] DOB: Nov 10, 2001 DOA: 09/30/2022  PCP: Massie Maroon, FNP  Admit date: 09/30/2022  Discharge date: 10/08/2022  Discharge Diagnoses:  Principal Problem:   Sickle cell pain crisis (HCC) Active Problems:   Anemia of chronic disease   Leukocytosis   Chronic pain syndrome   Hypocalcemia   Discharge Condition: Stable  Disposition:   Follow-up Information     Massie Maroon, FNP Follow up in 2 week(s).   Specialty: Family Medicine Contact information: [address] [place] [address] [phone]                Pt is discharged home in good condition and is to follow up with Massie Maroon, FNP this week to have labs evaluated. [name] is instructed to increase activity slowly and balance with rest for the next few days, and use prescribed medication to complete treatment of pain  Diet: Regular Wt Readings from Last 3 Encounters:  10/01/22 91 kg  09/23/22 91 kg  09/22/22 91 kg    History of present illness:  Gael Londo is a 21 year old male with a medical history significant of sickle cell disease, chronic pain syndrome, opiate dependence, and mild intermittent asthma presented to [facility] with compliant of sickle cell pain.   Patient states he hurts "all over" but denies chest pain or shortness of breath. No fever. Patient is on oxycodone 10 mg every 6 hours at home. He was last admitted for sickle cell crisis 10/30-11/2/23.   In the ED, he was afebrile, HR 118, RR 25, BP 157/83 and placed on 2L for tachypenea without hypoxia. WBCs of 15.7, hemoglobin of 9, platelet of 496.  Na of 137, K of 4.2, CBG of 127. Crteatinine of 0.7. Calcium 8.6, albumin 4.1. Total bilirubin of 2.8. Normal LFTs.  Chest xray negative. EKG with normal sinus rhythm, prolonged PR interval.   Hospital Course:  Sickle cell pain crisis:  Patient was admitted for sickle cell pain crisis and  managed appropriately with IVF, IV Dilaudid via PCA and IV Toradol, as well as other adjunct therapies per sickle cell pain management protocols. Patient very slow to respond to treatment plan. He continues to have some joint pain, but it has improved. He feels that he can manage at home on current medication regimen. Patient will resume   Hypoxia with respiratory failure secondary to asthma: Oxygen saturation remained decreased throughout admission.  On walking pulse oximetry, oxygen saturation decreased on room air.  He was maintained above 90% on 2 L supplemental oxygen.  Patient will go home on oxygen therapy.  He will follow-up with PCP for 6-minute walk test in 1 week.  Anemia of chronic disease: During admission, patient's hemoglobin remained stable and consistent with his baseline.  He will follow-up with PCP to repeat CBC and CMP in 1 week.  Patient was therefore discharged home today in a hemodynamically stable condition.   Davaun will follow-up with PCP within 1 week of this discharge. Jonte was counseled extensively about nonpharmacologic means of pain management, patient verbalized understanding and was appreciative of  the care received during this admission.   We discussed the need for good hydration, monitoring of hydration status, avoidance of heat, cold, stress, and infection triggers. We discussed the need to be adherent with taking Hydrea and other home medications. Patient was reminded of the need to seek medical attention immediately if any symptom of bleeding, anemia,  or infection occurs.  Discharge Exam: Vitals:   10/08/22 0817 10/08/22 0851  BP:    Pulse:    Resp:  18  Temp:    SpO2: 100% 97%   Vitals:   10/08/22 0358 10/08/22 0610 10/08/22 0817 10/08/22 0851  BP:  110/66    Pulse:  70    Resp: 20 17  18   Temp:  98 F (36.7 C)    TempSrc:      SpO2: 94% 97% 100% 97%  Weight:      Height:        General appearance : Awake, alert, not in any distress.  Speech Clear. Not toxic looking HEENT: Atraumatic and Normocephalic, pupils equally reactive to light and accomodation Neck: Supple, no JVD. No cervical lymphadenopathy.  Chest: Good air entry bilaterally, no added sounds  CVS: S1 S2 regular, no murmurs.  Abdomen: Bowel sounds present, Non tender and not distended with no gaurding, rigidity or rebound. Extremities: B/L Lower Ext shows no edema, both legs are warm to touch Neurology: Awake alert, and oriented X 3, CN II-XII intact, Non focal Skin: No Rash  Discharge Instructions  Discharge Instructions     Discharge patient   Complete by: As directed    Discharge disposition: 01-Home or Self Care   Discharge patient date: 10/08/2022      Allergies as of 10/08/2022       Reactions   Lisinopril Cough   Pork-derived Products Other (See Comments)   Patient does eat pork for religious reasons   Shellfish Allergy Other (See Comments)   Pt does not eat shellfish for religious reasons        Medication List     TAKE these medications    albuterol 108 (90 Base) MCG/ACT inhaler Commonly known as: VENTOLIN HFA Inhale 2 puffs into the lungs every 6 (six) hours as needed for wheezing or shortness of breath.   folic acid 1 MG tablet Commonly known as: FOLVITE Take 1 tablet (1 mg total) by mouth daily.   ibuprofen 800 MG tablet Commonly known as: ADVIL Take 1 tablet (800 mg total) by mouth every 8 (eight) hours as needed. What changed:  when to take this reasons to take this   montelukast 10 MG tablet Commonly known as: Singulair Take 1 tablet (10 mg total) by mouth at bedtime.   Oxycodone HCl 10 MG Tabs Take 1 tablet (10 mg total) by mouth every 4 (four) hours as needed (pain).               Durable Medical Equipment  (From admission, onward)           Start     Ordered   10/08/22 0904  For home use only DME oxygen  Once       Question Answer Comment  Length of Need 6 Months   Mode or (Route) Nasal  cannula   Liters per Minute 2   Oxygen conserving device Yes   Oxygen delivery system Gas      10/08/22 0904            The results of significant diagnostics from this hospitalization (including imaging, microbiology, ancillary and laboratory) are listed below for reference.    Significant Diagnostic Studies: DG Chest Port 1 View  Result Date: 10/03/2022 CLINICAL DATA:  Shortness of breath. EXAM: PORTABLE CHEST 1 VIEW COMPARISON:  10/01/2022. FINDINGS: Heart is enlarged the mediastinal contour is stable. The pulmonary vasculature is mildly distended. Lung volumes are low.  No consolidation, effusion, or pneumothorax. No acute osseous abnormality. IMPRESSION: Cardiomegaly with mild pulmonary vascular congestion. Electronically Signed   By: Thornell Sartorius M.D.   On: 10/03/2022 03:10   DG Chest Port 1 View  Result Date: 10/01/2022 CLINICAL DATA:  Chest pain; sickle cell crisis EXAM: PORTABLE CHEST 1 VIEW COMPARISON:  Radiographs 09/22/2022 FINDINGS: Heart size at the upper limits of normal. No focal consolidation, pleural effusion, or pneumothorax. No acute osseous abnormality. IMPRESSION: No active disease. Electronically Signed   By: Minerva Fester M.D.   On: 10/01/2022 01:04   CT HEAD WO CONTRAST ( )  Result Date: 09/24/2022 CLINICAL DATA:  Headache. EXAM: CT HEAD WITHOUT CONTRAST TECHNIQUE: Contiguous axial images were obtained from the base of the skull through the vertex without intravenous contrast. RADIATION DOSE REDUCTION: This exam was performed according to the departmental dose-optimization program which includes automated exposure control, adjustment of the mA and/or kV according to patient size and/or use of iterative reconstruction technique. COMPARISON:  Head CT 10/06/2020 and MRI 10/30/2015 FINDINGS: Brain: There is no evidence of an acute infarct, intracranial hemorrhage, mass, midline shift, or extra-axial fluid collection. The ventricles and sulci are normal. The  cerebellar tonsils are normally positioned. Vascular: No hyperdense vessel. Skull: No fracture. Patchy areas of sclerosis in the skull bilaterally which may be related to sickle cell anemia. No destructive skull lesion. Sinuses/Orbits: Visualized paranasal sinuses and mastoid air cells are clear. Old medial right orbital fracture. Other: None. IMPRESSION: Unremarkable CT appearance of the brain. Electronically Signed   By: Sebastian Ache M.D.   On: 09/24/2022 16:35   DG Chest 2 View  Result Date: 09/22/2022 CLINICAL DATA:  Sickle cell.  Pain. EXAM: CHEST - 2 VIEW COMPARISON:  June 09, 2022 FINDINGS: Bony changes of sickle cell. The heart, hila, mediastinum, lungs, and pleura are unremarkable. No pneumothorax or infiltrate. IMPRESSION: Bony changes of sickle cell. No other abnormalities. Electronically Signed   By: Gerome Sam III M.D.   On: 09/22/2022 17:26   DG Finger Index Right  Result Date: 09/22/2022 CLINICAL DATA:  Right index finger slammed in a house door this morning. Pain and bruising. EXAM: RIGHT INDEX FINGER 2+V COMPARISON:  None Available. FINDINGS: No fracture.  No bone lesion. Joints are normally spaced and aligned. Distal soft tissue swelling. IMPRESSION: No fracture or dislocation. Electronically Signed   By: Amie Portland M.D.   On: 09/22/2022 16:26    Microbiology: Recent Results (from the past 240 hour(s))  Resp Panel by RT-PCR (Flu A&B, Covid) Anterior Nasal Swab     Status: None   Collection Time: 10/03/22  2:54 AM   Specimen: Anterior Nasal Swab  Result Value Ref Range Status   SARS Coronavirus 2 by RT PCR NEGATIVE NEGATIVE Final    Comment: (NOTE) SARS-CoV-2 target nucleic acids are NOT DETECTED.  The SARS-CoV-2 RNA is generally detectable in upper respiratory specimens during the acute phase of infection. The lowest concentration of SARS-CoV-2 viral copies this assay can detect is 138 copies/mL. A negative result does not preclude SARS-Cov-2 infection and should  not be used as the sole basis for treatment or other patient management decisions. A negative result may occur with  improper specimen collection/handling, submission of specimen other than nasopharyngeal swab, presence of viral mutation(s) within the areas targeted by this assay, and inadequate number of viral copies(<138 copies/mL). A negative result must be combined with clinical observations, patient history, and epidemiological information. The expected result is Negative.  Fact Sheet for Patients:  BloggerCourse.comhttps://www.fda.gov/media/152166/download  Fact Sheet for Healthcare Providers:  SeriousBroker.ithttps://www.fda.gov/media/152162/download  This test is no t yet approved or cleared by the [place] FDA and  has been authorized for detection and/or diagnosis of SARS-CoV-2 by FDA under an Emergency Use Authorization (EUA). This EUA will remain  in effect (meaning this test can be used) for the duration of the COVID-19 declaration under Section 564(b)(1) of the Act, 21 U.S.C.section 360bbb-3(b)(1), unless the authorization is terminated  or revoked sooner.       Influenza A by PCR NEGATIVE NEGATIVE Final   Influenza B by PCR NEGATIVE NEGATIVE Final    Comment: (NOTE) The Xpert Xpress SARS-CoV-2/FLU/RSV plus assay is intended as an aid in the diagnosis of influenza from Nasopharyngeal swab specimens and should not be used as a sole basis for treatment. Nasal washings and aspirates are unacceptable for Xpert Xpress SARS-CoV-2/FLU/RSV testing.  Fact Sheet for Patients: BloggerCourse.comhttps://www.fda.gov/media/152166/download  Fact Sheet for Healthcare Providers: SeriousBroker.ithttps://www.fda.gov/media/152162/download  This test is not yet approved or cleared by the [place] FDA and has been authorized for detection and/or diagnosis of SARS-CoV-2 by FDA under an Emergency Use Authorization (EUA). This EUA will remain in effect (meaning this test can be used) for the duration of the COVID-19 declaration under  Section 564(b)(1) of the Act, 21 U.S.C. section 360bbb-3(b)(1), unless the authorization is terminated or revoked.  Performed at [facility], [address] [place], [place] [address]   Culture, blood (Routine X 2) w Reflex to ID Panel     Status: None (Preliminary result)   Collection Time: 10/03/22  4:51 AM   Specimen: BLOOD RIGHT ARM  Result Value Ref Range Status   Specimen Description   Final    BLOOD RIGHT ARM Performed at [facility], [address] [place], [place] [address]    Special Requests   Final    BOTTLES DRAWN AEROBIC ONLY Blood Culture results may not be optimal due to an inadequate volume of blood received in culture bottles Performed at [facility], [address] [place], [place] [address]    Culture   Final    NO GROWTH 4 DAYS Performed at [facility], [address] [place], [place] [address]    Report Status PENDING  Incomplete  Culture, blood (Routine X 2) w Reflex to ID Panel     Status: None (Preliminary result)   Collection Time: 10/03/22  4:51 AM   Specimen: BLOOD LEFT ARM  Result Value Ref Range Status   Specimen Description   Final    BLOOD LEFT ARM Performed at [facility], [address] [place], [place] [address]    Special Requests   Final    BOTTLES DRAWN AEROBIC ONLY Blood Culture adequate volume Performed at [facility], [address] [place], [place] [address]    Culture   Final    NO GROWTH 4 DAYS Performed at [facility], [address] [place], [place] [address]    Report Status PENDING  Incomplete  Urine Culture     Status: None   Collection Time: 10/03/22  4:10 PM   Specimen: Urine, Clean Catch  Result Value Ref Range Status   Specimen Description   Final    URINE, CLEAN CATCH Performed at [facility], [address] [place], [place] [address]    Special Requests   Final     Normal Performed at [facility], [address] [place], [place] [address]    Culture   Final    NO GROWTH Performed at [facility], [address], [place], [place] [address]    Report Status 10/04/2022 FINAL  Final     Labs: Basic Metabolic Panel: Recent Labs  Lab 10/02/22 0653 10/03/22 0501 10/05/22 0636 10/07/22 1058  NA 137 134* 132* 141  K 4.3 4.0 3.9 4.3  CL 103 97* 98 102  CO2 24 27 28 28   GLUCOSE 111* 125* 107* 96  BUN 7 9 7  22*  CREATININE 0.50* 0.63 0.60* 0.59*  CALCIUM 9.0 9.0 8.4* 8.7*   Liver Function Tests: Recent Labs  Lab 10/03/22 0501  AST 40  ALT 19  ALKPHOS 85  BILITOT 6.1*  PROT 7.4  ALBUMIN 3.8   No results for input(s): "LIPASE", "AMYLASE" in the last 168 hours. No results for input(s): "AMMONIA" in the last 168 hours. CBC: Recent Labs  Lab 10/02/22 0653 10/03/22 0501 10/05/22 0636 10/07/22 1058  WBC 17.9* 18.7* 18.4* 22.3*  NEUTROABS  --  13.1* 12.1* 14.3*  HGB 8.7* 8.6* 8.1* 7.8*  HCT 24.4* 23.7* 23.1* 22.4*  MCV 82.4 80.6 81.6 82.7  PLT 433* 354 396 478*   Cardiac Enzymes: No results for input(s): "CKTOTAL", "CKMB", "CKMBINDEX", "TROPONINI" in the last 168 hours. BNP: Invalid input(s): "POCBNP" CBG: No results for input(s): "GLUCAP" in the last 168 hours.  Time coordinating discharge: 30 minutes  Signed:  13/11/23  APRN, MSN, FNP-C Patient Care [facility] [address] [place], [address] [place] [id number]  [facility] 10/08/2022, 9:07 AM

## 2022-10-09 ENCOUNTER — Encounter: Payer: Self-pay | Admitting: Nurse Practitioner

## 2022-10-09 ENCOUNTER — Telehealth (HOSPITAL_COMMUNITY): Payer: Self-pay

## 2022-10-09 ENCOUNTER — Ambulatory Visit (INDEPENDENT_AMBULATORY_CARE_PROVIDER_SITE_OTHER): Payer: Self-pay | Admitting: Nurse Practitioner

## 2022-10-09 VITALS — BP 126/65 | HR 93 | Temp 97.9°F | Ht 67.0 in | Wt 187.6 lb

## 2022-10-09 DIAGNOSIS — D5700 Hb-SS disease with crisis, unspecified: Secondary | ICD-10-CM

## 2022-10-09 DIAGNOSIS — Z09 Encounter for follow-up examination after completed treatment for conditions other than malignant neoplasm: Secondary | ICD-10-CM | POA: Insufficient documentation
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
## 2022-10-09 NOTE — Progress Notes (Signed)
See notes above

## 2022-10-09 NOTE — Assessment & Plan Note (Signed)
Was on admission at the hospital from November 6th /2023 to November 14th 2023 Hospital notes, labs , imaging studies, reviewed by me today. Vital signs are stable today.patient still in a lot of pain.  Continue oxycodone 10 mg every 4 hours as needed, ibuprofen 800 mg every 8 hours as needed.  Currently on oxygen 2 L via nasal cannula Follow-up in 2 weeks to reevaluate need for supplemental oxygen

## 2022-10-09 NOTE — Patient Instructions (Addendum)
Please take oxycodone 10mg  every 4 hours as needed for your pain, Take ibuprofen 800mg  every 8 hours as needed as well for pain management   It is important that you exercise regularly at least 30 minutes 5 times a week as tolerated  Think about what you will eat, plan ahead. Choose " clean, green, fresh or frozen" over canned, processed or packaged foods which are more sugary, salty and fatty. 70 to 75% of food eaten should be vegetables and fruit. Three meals at set times with snacks allowed between meals, but they must be fruit or vegetables. Aim to eat over a 12 hour period , example 7 am to 7 pm, and STOP after  your last meal of the day. Drink water,generally about 64 ounces per day, no other drink is as healthy. Fruit juice is best enjoyed in a healthy way, by EATING the fruit.  Thanks for choosing [facility] we consider it a privelige to serve you.

## 2022-10-09 NOTE — Telephone Encounter (Signed)
Patient called in. Complains of pain in back, L arm and R leg rates 7/10. Denied chest pain, abd pain, fever, N/V/D. Wants to come in for treatment. Last took Oxycodone 10mg  at 5:00am. Pt states he was discharged from the hospital yesterday. Pt states his grandmother is his transportation. Pt denies recent covid exposure or flu like symptoms. FNP made aware, pt not approved to be seen in the day hospital today, instruct pt to stay on home oxygen, manage pain at home by taking home medications Q4 hours and if home medication runs out before next prescription is due, provider will address this issue. Instruct  pt to manage at home for the next 24 hours and encourage pt to call the day hospital tomorrow if pain is still not managed. Pt notified, verbalized understanding. Pt called back approx 10 minutes after this conversation, states he has been taking Oxycodone 20mg  at home every 4 hours and Ibuprofen, with little relief. Informed pt the provider will be notified and will contact him if plan of care today changes, pt verbalized understanding.

## 2022-10-09 NOTE — Assessment & Plan Note (Addendum)
Still having aching pain all over his body Pain rated 6/10 today has been taking oxycodone 10 mg every 4 hours as needed, ibuprofen 800 mg every 8 hours as needed.  Folic acid 1 mg daily.  Current medications. Need to maintain hydration discussed with the patient ,patient encouraged to engage in regular daily walking exercises as tolerated.  Seek help immediately if he develops fever shortness of breath.  Call the office in the morning if his pain does not improve.  Follow-up in 2 weeks with his  PCP. Lab Results  Component Value Date   WBC 22.3 (H) 10/07/2022   HGB 7.8 (L) 10/07/2022   HCT 22.4 (L) 10/07/2022   MCV 82.7 10/07/2022   PLT 478 (H) 10/07/2022    Lab Results  Component Value Date   NA 141 10/07/2022   K 4.3 10/07/2022   CO2 28 10/07/2022   GLUCOSE 96 10/07/2022   BUN 22 (H) 10/07/2022   CREATININE 0.59 (L) 10/07/2022   CALCIUM 8.7 (L) 10/07/2022   EGFR 134 04/16/2022   GFRNONAA >60 10/07/2022

## 2022-10-10 ENCOUNTER — Non-Acute Institutional Stay (HOSPITAL_COMMUNITY)
Admission: AD | Admit: 2022-10-10 | Discharge: 2022-10-10 | Disposition: A | Discharge status: Home / Self Care | Payer: Medicaid Other | Source: Ambulatory Visit | Attending: Internal Medicine | Admitting: Internal Medicine

## 2022-10-10 DIAGNOSIS — D5700 Hb-SS disease with crisis, unspecified: Secondary | ICD-10-CM | POA: Insufficient documentation

## 2022-10-10 DIAGNOSIS — J4520 Mild intermittent asthma, uncomplicated: Secondary | ICD-10-CM | POA: Insufficient documentation

## 2022-10-10 DIAGNOSIS — G894 Chronic pain syndrome: Secondary | ICD-10-CM | POA: Insufficient documentation

## 2022-10-10 DIAGNOSIS — F1120 Opioid dependence, uncomplicated: Secondary | ICD-10-CM | POA: Insufficient documentation

## 2022-10-10 LAB — LACTATE DEHYDROGENASE: LDH: 369 U/L — ABNORMAL HIGH (ref 98–192)

## 2022-10-10 LAB — RETICULOCYTES
Immature Retic Fract: 51.7 % — ABNORMAL HIGH (ref 2.3–15.9)
RBC.: 3.38 MIL/uL — ABNORMAL LOW (ref 4.22–5.81)
Retic Count, Absolute: 235.5 10*3/uL — ABNORMAL HIGH (ref 19.0–186.0)
Retic Ct Pct: 7.2 % — ABNORMAL HIGH (ref 0.4–3.1)

## 2022-10-10 LAB — COMPREHENSIVE METABOLIC PANEL
ALT: 32 U/L (ref 0–44)
AST: 22 U/L (ref 15–41)
Albumin: 4.3 g/dL (ref 3.5–5.0)
Alkaline Phosphatase: 88 U/L (ref 38–126)
Anion gap: 10 (ref 5–15)
BUN: 10 mg/dL (ref 6–20)
CO2: 26 mmol/L (ref 22–32)
Calcium: 9.6 mg/dL (ref 8.9–10.3)
Chloride: 100 mmol/L (ref 98–111)
Creatinine, Ser: 0.66 mg/dL (ref 0.61–1.24)
GFR, Estimated: >60 mL/min (ref >60)
Glucose, Bld: 110 mg/dL — ABNORMAL HIGH (ref 70–99)
Potassium: 4.1 mmol/L (ref 3.5–5.1)
Sodium: 136 mmol/L (ref 135–145)
Total Bilirubin: 2.3 mg/dL — ABNORMAL HIGH (ref 0.3–1.2)
Total Protein: 7.9 g/dL (ref 6.5–8.1)

## 2022-10-10 LAB — CBC WITH DIFFERENTIAL/PLATELET
Abs Immature Granulocytes: 0.09 10*3/uL — ABNORMAL HIGH (ref 0.00–0.07)
Basophils Absolute: 0.1 10*3/uL (ref 0.0–0.1)
Basophils Relative: 0 %
Eosinophils Absolute: 0.1 10*3/uL (ref 0.0–0.5)
Eosinophils Relative: 1 %
HCT: 28 % — ABNORMAL LOW (ref 39.0–52.0)
Hemoglobin: 9.5 g/dL — ABNORMAL LOW (ref 13.0–17.0)
Immature Granulocytes: 1 %
Lymphocytes Relative: 25 %
Lymphs Abs: 3.5 10*3/uL (ref 0.7–4.0)
MCH: 28.2 pg (ref 26.0–34.0)
MCHC: 33.9 g/dL (ref 30.0–36.0)
MCV: 83.1 fL (ref 80.0–100.0)
Monocytes Absolute: 1.6 10*3/uL — ABNORMAL HIGH (ref 0.1–1.0)
Monocytes Relative: 11 %
Neutro Abs: 8.8 10*3/uL — ABNORMAL HIGH (ref 1.7–7.7)
Neutrophils Relative %: 62 %
Platelets: 888 10*3/uL — ABNORMAL HIGH (ref 150–400)
RBC: 3.37 MIL/uL — ABNORMAL LOW (ref 4.22–5.81)
RDW: 20.2 % — ABNORMAL HIGH (ref 11.5–15.5)
WBC: 14.2 10*3/uL — ABNORMAL HIGH (ref 4.0–10.5)
nRBC: 1.6 % — ABNORMAL HIGH (ref 0.0–0.2)

## 2022-10-10 LAB — TYPE AND SCREEN
ABO/RH(D): O POS
Antibody Screen: NEGATIVE

## 2022-10-10 MED ORDER — KETOROLAC TROMETHAMINE 30 MG/ML IJ SOLN
15.0000 mg | Freq: Once | INTRAMUSCULAR | Status: AC
  Administered 2022-10-10: 15 mg via INTRAVENOUS
  Filled 2022-10-10: qty 1

## 2022-10-10 MED ORDER — OXYCODONE HCL ER 10 MG PO T12A: 10.0000 mg | EXTENDED_RELEASE_TABLET | Freq: Two times a day (BID) | ORAL | 0 refills | Status: DC

## 2022-10-10 MED ORDER — SODIUM CHLORIDE 0.9% FLUSH: 9.0000 mL | INTRAVENOUS | Status: DC | PRN

## 2022-10-10 MED ORDER — SODIUM CHLORIDE 0.45 % IV SOLN: INTRAVENOUS | Status: DC

## 2022-10-10 MED ORDER — NALOXONE HCL 0.4 MG/ML IJ SOLN: 0.4000 mg | INTRAMUSCULAR | Status: DC | PRN

## 2022-10-10 MED ORDER — OXYCODONE HCL ER 10 MG PO T12A
10.0000 mg | EXTENDED_RELEASE_TABLET | Freq: Once | ORAL | Status: AC
  Administered 2022-10-10: 10 mg via ORAL
  Filled 2022-10-10: qty 1

## 2022-10-10 MED ORDER — IBUPROFEN 800 MG PO TABS: 800.0000 mg | ORAL_TABLET | Freq: Three times a day (TID) | ORAL | 3 refills | Status: DC | PRN

## 2022-10-10 MED ORDER — ONDANSETRON HCL 4 MG/2ML IJ SOLN: 4.0000 mg | Freq: Four times a day (QID) | INTRAMUSCULAR | Status: DC | PRN

## 2022-10-10 MED ORDER — DIPHENHYDRAMINE HCL 25 MG PO CAPS: 25.0000 mg | ORAL_CAPSULE | ORAL | Status: DC | PRN

## 2022-10-10 MED ORDER — IBUPROFEN 800 MG PO TABS: 800.0000 mg | ORAL_TABLET | Freq: Three times a day (TID) | ORAL | 0 refills | Status: DC | PRN

## 2022-10-10 MED ORDER — ACETAMINOPHEN 500 MG PO TABS
1000.0000 mg | ORAL_TABLET | Freq: Once | ORAL | Status: AC
  Administered 2022-10-10: 1000 mg via ORAL
  Filled 2022-10-10: qty 2

## 2022-10-10 MED ORDER — HYDROMORPHONE 1 MG/ML IV SOLN: INTRAVENOUS | Status: DC

## 2022-10-10 MED ORDER — HYDROMORPHONE 1 MG/ML IV SOLN
INTRAVENOUS | Status: DC
  Administered 2022-10-10: 30 mg via INTRAVENOUS
  Administered 2022-10-10: 9 mg via INTRAVENOUS
  Filled 2022-10-10: qty 30

## 2022-10-10 NOTE — Progress Notes (Signed)
Patient came to the day hospital lobby for triage. Patient reports sickle cell pain in left leg, back and bilateral arms rated 7/10.  Reports taking Oxycodone at 6:00 am. COVID-19 screening done and patient denies all symptoms and exposures. Admits so some chest pain which is typical of his crisis but denies fever, nausea, vomiting, diarrhea, abdominal pain and priapism. [place], FNP notified. Patient can come to the day hospital for pain management. Patient advised and expresses an understanding.
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
## 2022-10-10 NOTE — Discharge Summary (Signed)
[facility] Discharge Summary   Patient ID: [name] Hill MRN: [id number] DOB/AGE: 02/18/2001 21 y.o.  Admit date: 10/10/2022 Discharge date: 10/10/2022  Primary Care Physician:  Massie Maroon, FNP  Admission Diagnoses:  Principal Problem:   Sickle cell pain crisis [facility])   Discharge Medications:  Allergies as of 10/10/2022       Reactions   Lisinopril Cough   Pork-derived Products Other (See Comments)   Patient does eat pork for religious reasons   Shellfish Allergy Other (See Comments)   Pt does not eat shellfish for religious reasons        Medication List     TAKE these medications    albuterol 108 (90 Base) MCG/ACT inhaler Commonly known as: VENTOLIN HFA Inhale 2 puffs into the lungs every 6 (six) hours as needed for wheezing or shortness of breath.   folic acid 1 MG tablet Commonly known as: FOLVITE Take 1 tablet (1 mg total) by mouth daily.   ibuprofen 800 MG tablet Commonly known as: ADVIL Take 1 tablet (800 mg total) by mouth every 8 (eight) hours as needed. What changed:  when to take this reasons to take this   montelukast 10 MG tablet Commonly known as: Singulair Take 1 tablet (10 mg total) by mouth at bedtime.   Oxycodone HCl 10 MG Tabs Take 1 tablet (10 mg total) by mouth every 4 (four) hours as needed (pain). What changed: Another medication with the same name was added. Make sure you understand how and when to take each.   oxyCODONE 10 mg 12 hr tablet Commonly known as: OXYCONTIN Take 1 tablet (10 mg total) by mouth every 12 (twelve) hours. What changed: You were already taking a medication with the same name, and this prescription was added. Make sure you understand how and when to take each.         Consults:  None  Significant Diagnostic Studies:  DG Chest Port 1 View  Result Date: 10/03/2022 CLINICAL DATA:  Shortness of breath. EXAM: PORTABLE CHEST 1 VIEW COMPARISON:  10/01/2022. FINDINGS: Heart is  enlarged the mediastinal contour is stable. The pulmonary vasculature is mildly distended. Lung volumes are low. No consolidation, effusion, or pneumothorax. No acute osseous abnormality. IMPRESSION: Cardiomegaly with mild pulmonary vascular congestion. Electronically Signed   By: Thornell Sartorius M.D.   On: 10/03/2022 03:10   DG Chest Port 1 View  Result Date: 10/01/2022 CLINICAL DATA:  Chest pain; sickle cell crisis EXAM: PORTABLE CHEST 1 VIEW COMPARISON:  Radiographs 09/22/2022 FINDINGS: Heart size at the upper limits of normal. No focal consolidation, pleural effusion, or pneumothorax. No acute osseous abnormality. IMPRESSION: No active disease. Electronically Signed   By: Minerva Fester M.D.   On: 10/01/2022 01:04   CT HEAD WO CONTRAST ( )  Result Date: 09/24/2022 CLINICAL DATA:  Headache. EXAM: CT HEAD WITHOUT CONTRAST TECHNIQUE: Contiguous axial images were obtained from the base of the skull through the vertex without intravenous contrast. RADIATION DOSE REDUCTION: This exam was performed according to the departmental dose-optimization program which includes automated exposure control, adjustment of the mA and/or kV according to patient size and/or use of iterative reconstruction technique. COMPARISON:  Head CT 10/06/2020 and MRI 10/30/2015 FINDINGS: Brain: There is no evidence of an acute infarct, intracranial hemorrhage, mass, midline shift, or extra-axial fluid collection. The ventricles and sulci are normal. The cerebellar tonsils are normally positioned. Vascular: No hyperdense vessel. Skull: No fracture. Patchy areas of sclerosis in the skull bilaterally which may  be related to sickle cell anemia. No destructive skull lesion. Sinuses/Orbits: Visualized paranasal sinuses and mastoid air cells are clear. Old medial right orbital fracture. Other: None. IMPRESSION: Unremarkable CT appearance of the brain. Electronically Signed   By: Sebastian Ache M.D.   On: 09/24/2022 16:35   DG Chest 2  View  Result Date: 09/22/2022 CLINICAL DATA:  Sickle cell.  Pain. EXAM: CHEST - 2 VIEW COMPARISON:  June 09, 2022 FINDINGS: Bony changes of sickle cell. The heart, hila, mediastinum, lungs, and pleura are unremarkable. No pneumothorax or infiltrate. IMPRESSION: Bony changes of sickle cell. No other abnormalities. Electronically Signed   By: Gerome Sam III M.D.   On: 09/22/2022 17:26   DG Finger Index Right  Result Date: 09/22/2022 CLINICAL DATA:  Right index finger slammed in a house door this morning. Pain and bruising. EXAM: RIGHT INDEX FINGER 2+V COMPARISON:  None Available. FINDINGS: No fracture.  No bone lesion. Joints are normally spaced and aligned. Distal soft tissue swelling. IMPRESSION: No fracture or dislocation. Electronically Signed   By: Amie Portland M.D.   On: 09/22/2022 16:26    History of present illness: [name] Hill is a 21 year old male with a medical history significant for sickle cell disease, chronic pain syndrome, opiate dependence and tolerance, mild intermittent asthma, chronic hypoxia on home oxygen, and anemia of chronic disease presents to sickle cell day clinic with allover body pain.  Pain is consistent with his previous sickle cell pain crisis.  Patient was just discharged from inpatient services on 10/08/2022.  Patient states that upon arriving home, pain intensity increased and has been uncontrolled on his home medications.  He says that he has been taking oxycodone 20 mg, which is above his prescribed dose around every 4 hours without very much relief.  Patient says that pain has been constant and aching.  He denies any fever, chills, chest pain, or shortness of breath.  No urinary symptoms, nausea, vomiting, or diarrhea.  Patient was discharged home with oxygen at 2 L and has been wearing consistently.  He denies any sick contacts or known exposure to COVID-19.  Of note, patient was hospitalized for total of 7 days.  Sickle cell center course: Reviewed all  laboratory values, largely consistent with patient's baseline. Patient has had a very difficult time with frequent sickle cell pain crisis.  He is not on any disease modifying agents at this time and is requesting information pertaining to these.  Patient has failed hydroxyurea in the past due to its side effects. Today, his pain was managed with IV Dilaudid PCA, IV fluids, OxyContin, Toradol, and Tylenol. Patient's pain intensity decreased to 4/10 and he is requesting discharge home. Patient will be discharged home with a trial of OxyContin 10 mg every 12 hours to assist with his pain control.  A 7-day course of this medication has been sent to patient's pharmacy and PDMP was reviewed prior to prescribing medications.  No inconsistencies were noted. In addition, ibuprofen 800 mg every 8 hours as needed for mild to moderate pain was sent to patient's pharmacy as well. He is alert, oriented, and ambulating without assistance. This patient will discharge home in a hemodynamically stable condition.  Of note, patient will follow-up with clinic in 1 week to discuss monthly simple exchange transfusions as therapy.  Discharge instructions: Resume all home medications.   Follow up with PCP as previously  scheduled.   Discussed the importance of drinking 64 ounces of water daily, dehydration of red blood cells  may lead further sickling.   Avoid all stressors that precipitate sickle cell pain crisis.     The patient was given clear instructions to go to ER or return to medical center if symptoms do not improve, worsen or new problems develop.   Physical Exam at Discharge:  BP (!) 117/59 (BP Location: Right Arm)   Pulse 74   Temp 98.6 F (37 C) (Oral)   Resp 18   SpO2 100%  Physical Exam Constitutional:      Appearance: He is obese.  Eyes:     Pupils: Pupils are equal, round, and reactive to light.  Cardiovascular:     Rate and Rhythm: Normal rate and regular rhythm.     Pulses: Normal  pulses.  Pulmonary:     Effort: Pulmonary effort is normal.  Abdominal:     General: Bowel sounds are normal.  Musculoskeletal:        General: Normal range of motion.  Skin:    General: Skin is warm.  Neurological:     General: No focal deficit present.  Psychiatric:        Mood and Affect: Mood normal.        Behavior: Behavior normal.        Thought Content: Thought content normal.        Judgment: Judgment normal.      Disposition at Discharge: Discharge disposition: 01-Home or Self Care       Discharge Orders: Discharge Instructions     Discharge patient   Complete by: As directed    Discharge disposition: 01-Home or Self Care   Discharge patient date: 10/10/2022       Condition at Discharge:   Stable  Time spent on Discharge:  Greater than 30 minutes.  Signed: Nolon Nations  APRN, MSN, FNP-C Patient Care [facility] [address] [place], [place] [address] [phone]  10/10/2022, 12:20 PM

## 2022-10-10 NOTE — Discharge Instructions (Signed)
Will start a trial of Oxycontin 10 mg every 12 hours.   Follow up with sickle cell clinic next week concerning exchange transfusions

## 2022-10-10 NOTE — Progress Notes (Signed)
Pt admitted to day hospital today for sickle cell pain. On arrival, pt rated 7/10 generalized pain. Pt received Dilaudid PCA, IV Toradol 15 mg, and IV fluids via PIV. Pt also received 1,000 mg PO Tylenol and 10mg  OxyContin.At discharge pt rated pain 4/10. AVS offered, but pt declined. Pt alert, awake and ambulatory with home oxygen at discharge.

## 2022-10-11 ENCOUNTER — Telehealth: Payer: Self-pay | Admitting: Family Medicine

## 2022-10-11 ENCOUNTER — Other Ambulatory Visit: Payer: Self-pay | Admitting: Family Medicine

## 2022-10-11 DIAGNOSIS — F1190 Opioid use, unspecified, uncomplicated: Secondary | ICD-10-CM

## 2022-10-11 DIAGNOSIS — D571 Sickle-cell disease without crisis: Secondary | ICD-10-CM

## 2022-10-11 DIAGNOSIS — G894 Chronic pain syndrome: Secondary | ICD-10-CM

## 2022-10-11 MED ORDER — OXYCODONE HCL 10 MG PO TABS: 10.0000 mg | ORAL_TABLET | ORAL | 0 refills | Status: DC | PRN

## 2022-10-11 NOTE — Telephone Encounter (Signed)
Oxycodone 10 mg refill request  states he is about to run out of the oxycodone so requesting to be refilled before the weekend so he does not run out

## 2022-10-11 NOTE — Progress Notes (Signed)
Reviewed PDMP substance reporting system prior to prescribing opiate medications. No inconsistencies noted.   Meds ordered this encounter  Medications  . Oxycodone HCl 10 MG TABS    Sig: Take 1 tablet (10 mg total) by mouth every 4 (four) hours as needed (pain).    Dispense:  60 tablet    Refill:  0    Order Specific Question:   Supervising Provider    Answer:   JEGEDE, OLUGBEMIGA E [[id number]]     [name] Hill Myleen Brailsford  APRN, MSN, FNP-C Patient Care Center [facility] [address]  [place], [place] [address] [phone]  

## 2022-10-15 ENCOUNTER — Telehealth: Payer: Self-pay | Admitting: Family Medicine

## 2022-10-15 NOTE — Telephone Encounter (Signed)
Caller & Relationship to patient:  MRN #  [id number]   Call Back Number:   Date of Last Office Visit: 10/11/2022     Date of Next Office Visit: 10/23/2022    Medication(s) to be Refilled: Oxycodone   Preferred Pharmacy:   ** Please notify patient to allow 48-72 hours to process** **Let patient know to contact pharmacy at the end of the day to make sure medication is ready. ** **If patient has not been seen in a year or longer, book an appointment **Advise to use MyChart for refill requests OR to contact their pharmacy

## 2022-10-23 ENCOUNTER — Encounter: Payer: Self-pay | Admitting: Nurse Practitioner

## 2022-10-23 ENCOUNTER — Ambulatory Visit (INDEPENDENT_AMBULATORY_CARE_PROVIDER_SITE_OTHER): Payer: Medicaid Other | Admitting: Nurse Practitioner

## 2022-10-23 VITALS — BP 132/71 | HR 87 | Temp 97.2°F | Wt 190.4 lb

## 2022-10-23 DIAGNOSIS — M79605 Pain in left leg: Secondary | ICD-10-CM

## 2022-10-23 DIAGNOSIS — D571 Sickle-cell disease without crisis: Secondary | ICD-10-CM

## 2022-10-23 NOTE — Patient Instructions (Signed)
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
## 2022-10-23 NOTE — Assessment & Plan Note (Signed)
Since his recent admission at the hospital Will check stat venous US to rule out DVT.

## 2022-10-23 NOTE — Assessment & Plan Note (Addendum)
Sickle cell disease - Continue  Folic acid 1mg  tablet daily. He was intolerant to hydroxyurea in the past. Maintain adequate hydration , avoid triggers of infection, cold , stress. Checking HGB fractionation cascade, needs possible exchange transfusion to prevent frequent crisis. Will forward results to NP to review when results come back .  Pulmonary evaluation - patient denies SOB, cough, wheezing, oxygen saturation normal on room air .   Cardiac - patient denies chest pain, dizziness, syncope.    Eye - High risk of proliferative retinopathy. Annual eye exam with retinal exam recommended to patient. Patient encouraged to get his eye exam done .  Acute and chronic painful episodes - continue oxycodone 10mg  every 4 hours as needed for sever pain, ibuprofen 800mg  TID PRN.   - Iron overload from chronic transfusion.  Will check CBC, ferritin levels   Vitamin D deficiency. Checking sickle cell panel today , states that he is taking vitamin D supplement , does not know how much units.

## 2022-10-24 ENCOUNTER — Ambulatory Visit (HOSPITAL_COMMUNITY)
Admission: RE | Admit: 2022-10-24 | Discharge: 2022-10-24 | Disposition: A | Discharge status: Home / Self Care | Payer: Medicaid Other | Source: Ambulatory Visit | Attending: Nurse Practitioner | Admitting: Nurse Practitioner

## 2022-10-24 DIAGNOSIS — M79605 Pain in left leg: Secondary | ICD-10-CM | POA: Diagnosis not present

## 2022-10-24 NOTE — Progress Notes (Signed)
Left lower extremity venous duplex has been completed. Preliminary results can be found in CV Proc through chart review.  Results were given to Edwin Dada FNP.  10/24/22 11:00 AM Olen Cordial RVT

## 2022-10-25 ENCOUNTER — Other Ambulatory Visit: Payer: Self-pay | Admitting: Nurse Practitioner

## 2022-10-25 ENCOUNTER — Other Ambulatory Visit: Payer: Self-pay | Admitting: *Deleted

## 2022-10-25 DIAGNOSIS — E559 Vitamin D deficiency, unspecified: Secondary | ICD-10-CM

## 2022-10-25 DIAGNOSIS — G894 Chronic pain syndrome: Secondary | ICD-10-CM

## 2022-10-25 DIAGNOSIS — D571 Sickle-cell disease without crisis: Secondary | ICD-10-CM

## 2022-10-25 DIAGNOSIS — F1190 Opioid use, unspecified, uncomplicated: Secondary | ICD-10-CM

## 2022-10-25 MED ORDER — VITAMIN D (ERGOCALCIFEROL) 1.25 MG (50000 UNIT) PO CAPS: 50000.0000 [IU] | ORAL_CAPSULE | ORAL | 0 refills | Status: DC

## 2022-10-25 NOTE — Progress Notes (Signed)
Normal ultrasound, no DVT

## 2022-10-25 NOTE — Progress Notes (Signed)
Stable blood count and kidney function.   Vitamin D level is low, start taking vitamin D 50,000 units once weekly for 8 weeks ,eats foods rich in vitamin D like   Cod liver oil,Salmon,Swordfish,Tuna fish,,Dairy and milks fortified with vitamin D,Sardines,Beef liver

## 2022-10-26 ENCOUNTER — Other Ambulatory Visit: Payer: Self-pay | Admitting: Family Medicine

## 2022-10-26 DIAGNOSIS — F1190 Opioid use, unspecified, uncomplicated: Secondary | ICD-10-CM

## 2022-10-26 DIAGNOSIS — G894 Chronic pain syndrome: Secondary | ICD-10-CM

## 2022-10-26 DIAGNOSIS — D571 Sickle-cell disease without crisis: Secondary | ICD-10-CM

## 2022-10-26 LAB — CMP14+CBC/D/PLT+FER+RETIC+V...
ALT: 13 IU/L (ref 0–44)
AST: 19 IU/L (ref 0–40)
Albumin/Globulin Ratio: 1.8 (ref 1.2–2.2)
Albumin: 4.2 g/dL — ABNORMAL LOW (ref 4.3–5.2)
Alkaline Phosphatase: 96 IU/L (ref 44–121)
BUN/Creatinine Ratio: 8 — ABNORMAL LOW (ref 9–20)
BUN: 5 mg/dL — ABNORMAL LOW (ref 6–20)
Basophils Absolute: 0.1 10*3/uL (ref 0.0–0.2)
Basos: 2 %
Bilirubin Total: 1.8 mg/dL — ABNORMAL HIGH (ref 0.0–1.2)
CO2: 20 mmol/L (ref 20–29)
Calcium: 9.6 mg/dL (ref 8.7–10.2)
Chloride: 105 mmol/L (ref 96–106)
Creatinine, Ser: 0.61 mg/dL — ABNORMAL LOW (ref 0.76–1.27)
EOS (ABSOLUTE): 0.2 10*3/uL (ref 0.0–0.4)
Eos: 2 %
Ferritin: 393 ng/mL (ref 30–400)
Globulin, Total: 2.3 g/dL (ref 1.5–4.5)
Glucose: 82 mg/dL (ref 70–99)
Hematocrit: 31.2 % — ABNORMAL LOW (ref 37.5–51.0)
Hemoglobin: 10 g/dL — ABNORMAL LOW (ref 13.0–17.7)
Immature Grans (Abs): 0 10*3/uL (ref 0.0–0.1)
Immature Granulocytes: 0 %
Lymphocytes Absolute: 3.1 10*3/uL (ref 0.7–3.1)
Lymphs: 36 %
MCH: 28.2 pg (ref 26.6–33.0)
MCHC: 32.1 g/dL (ref 31.5–35.7)
MCV: 88 fL (ref 79–97)
Monocytes Absolute: 1.2 10*3/uL — ABNORMAL HIGH (ref 0.1–0.9)
Monocytes: 14 %
Neutrophils Absolute: 4 10*3/uL (ref 1.4–7.0)
Neutrophils: 46 %
Platelets: 640 10*3/uL — ABNORMAL HIGH (ref 150–450)
Potassium: 4.3 mmol/L (ref 3.5–5.2)
RBC: 3.54 x10E6/uL — ABNORMAL LOW (ref 4.14–5.80)
RDW: 17.5 % — ABNORMAL HIGH (ref 11.6–15.4)
Retic Ct Pct: 5.2 % — ABNORMAL HIGH (ref 0.6–2.6)
Sodium: 140 mmol/L (ref 134–144)
Total Protein: 6.5 g/dL (ref 6.0–8.5)
Vit D, 25-Hydroxy: 9.9 ng/mL — ABNORMAL LOW (ref 30.0–100.0)
WBC: 8.7 10*3/uL (ref 3.4–10.8)
eGFR: 138 mL/min/{1.73_m2} (ref >59)

## 2022-10-26 LAB — HGB FRAC BY HPLC+SOLUBILITY
Hgb A: 0 % — ABNORMAL LOW (ref 96.4–98.8)
Hgb C: 0 %
Hgb E: 0 %
Hgb F: 3.4 % — ABNORMAL HIGH (ref 0.0–2.0)
Hgb S: 92.9 % — ABNORMAL HIGH
Hgb Solubility: POSITIVE — AB
Hgb Variant: 0 %

## 2022-10-26 LAB — HGB FRACTIONATION CASCADE: Hgb A2: 3.7 % — ABNORMAL HIGH (ref 1.8–3.2)

## 2022-10-26 MED ORDER — OXYCODONE HCL 10 MG PO TABS: 10.0000 mg | ORAL_TABLET | ORAL | 0 refills | Status: DC | PRN

## 2022-10-26 NOTE — Progress Notes (Signed)
Reviewed PDMP substance reporting system prior to prescribing opiate medications. No inconsistencies noted.   Meds ordered this encounter  Medications  . Oxycodone HCl 10 MG TABS    Sig: Take 1 tablet (10 mg total) by mouth every 4 (four) hours as needed (pain).    Dispense:  60 tablet    Refill:  0    Order Specific Question:   Supervising Provider    Answer:   JEGEDE, OLUGBEMIGA E [[id number]]     Jamiee Milholland Moore Delphin Funes  APRN, MSN, FNP-C Patient Care Center [facility] [address]  [place], [place] [address] [phone]  

## 2022-10-28 ENCOUNTER — Telehealth: Payer: Self-pay | Admitting: Family Medicine

## 2022-10-28 NOTE — Telephone Encounter (Signed)
Sent my chart message. Kh 

## 2022-10-28 NOTE — Telephone Encounter (Signed)
Pt saw fola last Wednesday and forgot to ask for a work note. Pt called stating needs work note to return to work today.

## 2022-10-30 NOTE — Progress Notes (Signed)
[name] Hill is a 21 year old male with a medical history significant for sickle cell disease type SS.  Reviewed patient's laboratory values, his percent hemoglobin S was around 92%.  Patient may benefit from monthly exchange transfusions in clinic.  Recommend that patient calls sickle cell center to schedule exchange, first available appointment.  NP P[place] reviewed patient's other laboratory results and communicated accordingly.  Nolon Nations  APRN, MSN, FNP-C Patient Care [facility] [address] [place], [place] [address] [phone]

## 2022-10-31 ENCOUNTER — Other Ambulatory Visit: Payer: Self-pay

## 2022-10-31 ENCOUNTER — Telehealth: Payer: Self-pay

## 2022-10-31 NOTE — Telephone Encounter (Signed)
Approved until 05/02/2023

## 2022-10-31 NOTE — Telephone Encounter (Signed)
Oxycodone prior authorization submitted to insurance via covermymeds Key: [id number]

## 2022-12-11 ENCOUNTER — Other Ambulatory Visit: Payer: Self-pay

## 2022-12-11 DIAGNOSIS — F1190 Opioid use, unspecified, uncomplicated: Secondary | ICD-10-CM

## 2022-12-11 DIAGNOSIS — G894 Chronic pain syndrome: Secondary | ICD-10-CM

## 2022-12-11 DIAGNOSIS — D571 Sickle-cell disease without crisis: Secondary | ICD-10-CM

## 2022-12-11 MED ORDER — OXYCODONE HCL 10 MG PO TABS: 10.0000 mg | ORAL_TABLET | ORAL | 0 refills | Status: DC | PRN

## 2022-12-11 NOTE — Telephone Encounter (Signed)
From: [name] To: Office of Cammie Sickle, [place] Sent: 12/11/2022 12:16 PM EST Subject: Medication Renewal Request  Refills have been requested for the following medications:   Oxycodone HCl 10 MG TABS [Lachina Hollis]  Preferred pharmacy: [id number] - [place], [place] - [address] Delivery method: [employer]

## 2022-12-17 ENCOUNTER — Other Ambulatory Visit: Payer: Self-pay | Admitting: Family Medicine

## 2022-12-17 ENCOUNTER — Telehealth (HOSPITAL_COMMUNITY): Payer: Self-pay

## 2022-12-17 ENCOUNTER — Non-Acute Institutional Stay (HOSPITAL_COMMUNITY)
Admission: AD | Admit: 2022-12-17 | Discharge: 2022-12-17 | Disposition: A | Discharge status: Home / Self Care | Payer: Medicaid Other | Source: Ambulatory Visit | Attending: Internal Medicine | Admitting: Internal Medicine

## 2022-12-17 DIAGNOSIS — Z79899 Other long term (current) drug therapy: Secondary | ICD-10-CM | POA: Insufficient documentation

## 2022-12-17 DIAGNOSIS — D638 Anemia in other chronic diseases classified elsewhere: Secondary | ICD-10-CM | POA: Diagnosis not present

## 2022-12-17 DIAGNOSIS — I10 Essential (primary) hypertension: Secondary | ICD-10-CM | POA: Diagnosis not present

## 2022-12-17 DIAGNOSIS — D5700 Hb-SS disease with crisis, unspecified: Secondary | ICD-10-CM | POA: Diagnosis present

## 2022-12-17 DIAGNOSIS — J4520 Mild intermittent asthma, uncomplicated: Secondary | ICD-10-CM | POA: Insufficient documentation

## 2022-12-17 LAB — CBC WITH DIFFERENTIAL/PLATELET
Abs Immature Granulocytes: 0.03 10*3/uL (ref 0.00–0.07)
Basophils Absolute: 0.2 10*3/uL — ABNORMAL HIGH (ref 0.0–0.1)
Basophils Relative: 2 %
Eosinophils Absolute: 0.3 10*3/uL (ref 0.0–0.5)
Eosinophils Relative: 3 %
HCT: 27.9 % — ABNORMAL LOW (ref 39.0–52.0)
Hemoglobin: 9.9 g/dL — ABNORMAL LOW (ref 13.0–17.0)
Immature Granulocytes: 0 %
Lymphocytes Relative: 42 %
Lymphs Abs: 4.5 10*3/uL — ABNORMAL HIGH (ref 0.7–4.0)
MCH: 29.4 pg (ref 26.0–34.0)
MCHC: 35.5 g/dL (ref 30.0–36.0)
MCV: 82.8 fL (ref 80.0–100.0)
Monocytes Absolute: 1.2 10*3/uL — ABNORMAL HIGH (ref 0.1–1.0)
Monocytes Relative: 11 %
Neutro Abs: 4.5 10*3/uL (ref 1.7–7.7)
Neutrophils Relative %: 42 %
Platelets: 394 10*3/uL (ref 150–400)
RBC: 3.37 MIL/uL — ABNORMAL LOW (ref 4.22–5.81)
RDW: 21.6 % — ABNORMAL HIGH (ref 11.5–15.5)
WBC: 10.6 10*3/uL — ABNORMAL HIGH (ref 4.0–10.5)
nRBC: 0.6 % — ABNORMAL HIGH (ref 0.0–0.2)

## 2022-12-17 LAB — COMPREHENSIVE METABOLIC PANEL WITH GFR
ALT: 27 U/L (ref 0–44)
AST: 83 U/L — ABNORMAL HIGH (ref 15–41)
Albumin: 4.2 g/dL (ref 3.5–5.0)
Alkaline Phosphatase: 65 U/L (ref 38–126)
Anion gap: 8 (ref 5–15)
BUN: 7 mg/dL (ref 6–20)
CO2: 24 mmol/L (ref 22–32)
Calcium: 8.9 mg/dL (ref 8.9–10.3)
Chloride: 106 mmol/L (ref 98–111)
Creatinine, Ser: 0.56 mg/dL — ABNORMAL LOW (ref 0.61–1.24)
GFR, Estimated: >60 mL/min (ref >60)
Glucose, Bld: 93 mg/dL (ref 70–99)
Potassium: 3.8 mmol/L (ref 3.5–5.1)
Sodium: 138 mmol/L (ref 135–145)
Total Bilirubin: 3.8 mg/dL — ABNORMAL HIGH (ref 0.3–1.2)
Total Protein: 7.3 g/dL (ref 6.5–8.1)

## 2022-12-17 LAB — RETICULOCYTES
Immature Retic Fract: 37.4 % — ABNORMAL HIGH (ref 2.3–15.9)
RBC.: 3.4 MIL/uL — ABNORMAL LOW (ref 4.22–5.81)
Retic Count, Absolute: 188.4 10*3/uL — ABNORMAL HIGH (ref 19.0–186.0)
Retic Ct Pct: 5.5 % — ABNORMAL HIGH (ref 0.4–3.1)

## 2022-12-17 LAB — LACTATE DEHYDROGENASE: LDH: 569 U/L — ABNORMAL HIGH (ref 98–192)

## 2022-12-17 MED ORDER — NALOXONE HCL 0.4 MG/ML IJ SOLN: 0.4000 mg | INTRAMUSCULAR | Status: DC | PRN

## 2022-12-17 MED ORDER — DIPHENHYDRAMINE HCL 25 MG PO CAPS
25.0000 mg | ORAL_CAPSULE | ORAL | Status: DC | PRN
  Administered 2022-12-17: 25 mg via ORAL
  Filled 2022-12-17: qty 1

## 2022-12-17 MED ORDER — KETOROLAC TROMETHAMINE 30 MG/ML IJ SOLN
15.0000 mg | Freq: Once | INTRAMUSCULAR | Status: AC
  Administered 2022-12-17: 15 mg via INTRAVENOUS
  Filled 2022-12-17: qty 1

## 2022-12-17 MED ORDER — SODIUM CHLORIDE 0.9% FLUSH: 9.0000 mL | INTRAVENOUS | Status: DC | PRN

## 2022-12-17 MED ORDER — SODIUM CHLORIDE 0.45 % IV SOLN: INTRAVENOUS | Status: DC

## 2022-12-17 MED ORDER — HYDROMORPHONE 1 MG/ML IV SOLN
INTRAVENOUS | Status: DC
  Administered 2022-12-17: 30 mg via INTRAVENOUS
  Administered 2022-12-17: 11 mg via INTRAVENOUS
  Filled 2022-12-17: qty 30

## 2022-12-17 MED ORDER — IBUPROFEN 800 MG PO TABS: 800.0000 mg | ORAL_TABLET | Freq: Three times a day (TID) | ORAL | 3 refills | Status: DC | PRN

## 2022-12-17 MED ORDER — ACETAMINOPHEN 500 MG PO TABS
1000.0000 mg | ORAL_TABLET | Freq: Once | ORAL | Status: AC
  Administered 2022-12-17: 1000 mg via ORAL
  Filled 2022-12-17: qty 2

## 2022-12-17 MED ORDER — ONDANSETRON HCL 4 MG/2ML IJ SOLN: 4.0000 mg | Freq: Four times a day (QID) | INTRAMUSCULAR | Status: DC | PRN

## 2022-12-17 NOTE — Progress Notes (Signed)
Meds ordered this encounter  Medications   ibuprofen (ADVIL) 800 MG tablet    Sig: Take 1 tablet (800 mg total) by mouth every 8 (eight) hours as needed.    Dispense:  30 tablet    Refill:  3    Order Specific Question:   Supervising Provider    Answer:   Tresa Garter [[id number]]     Donia Pounds  APRN, MSN, FNP-C Patient [facility] [address] [place], [place] [address] [phone]

## 2022-12-17 NOTE — Progress Notes (Signed)
Patient admitted to the day infusion hospital for sickle cell pain. Initially, patient reported left arm pain rated 5/10. For pain management, patient placed on Dilaudid PCA, given IV Toradol, Tylenol and hydrated with IV fluids. At discharge, patient rated pain at 2/10. Vital signs stable. AVS offered but patient refused. Patient alert, oriented and ambulatory at discharge.
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
## 2022-12-17 NOTE — Discharge Summary (Addendum)
[name] Discharge Summary   Patient ID: [name] Hill MRN: [id number] DOB/AGE: Sep 08, 2001 22 y.o.  Admit date: 12/17/2022 Discharge date: 12/17/2022  Primary Care Physician:  Dorena Dew, FNP  Admission Diagnoses:  Principal Problem:   Sickle cell pain crisis [facility])   Discharge Medications:  Allergies as of 12/17/2022       Reactions   Lisinopril Cough   Pork-derived Products Other (See Comments)   Patient does eat pork for religious reasons   Shellfish Allergy Other (See Comments)   Pt does not eat shellfish for religious reasons        Medication List     TAKE these medications    albuterol 108 (90 Base) MCG/ACT inhaler Commonly known as: VENTOLIN HFA Inhale 2 puffs into the lungs every 6 (six) hours as needed for wheezing or shortness of breath.   folic acid 1 MG tablet Commonly known as: FOLVITE Take 1 tablet (1 mg total) by mouth daily.   ibuprofen 800 MG tablet Commonly known as: ADVIL Take 1 tablet (800 mg total) by mouth every 8 (eight) hours as needed.   montelukast 10 MG tablet Commonly known as: Singulair Take 1 tablet (10 mg total) by mouth at bedtime.   Oxycodone HCl 10 MG Tabs Take 1 tablet (10 mg total) by mouth every 4 (four) hours as needed (pain).   Vitamin D (Ergocalciferol) 1.25 MG (50000 UNIT) Caps capsule Commonly known as: DRISDOL Take 1 capsule (50,000 Units total) by mouth every 7 (seven) days.         Consults:  None  Significant Diagnostic Studies:  No results found.   History of Present Illness: [name] Hill is a very pleasant 22 year old male with a medical history significant for sickle cell disease, anemia of chronic disease, and mild intermittent asthma presents with complaints of pain primarily to right upper extremity for 1 day.  Patient attributes current pain to typical sickle cell pain crisis.  Patient attributes crisis to changes in weather.  He states that pain started after walking  outside in cold weather after leaving the gym.  Pain intensity is 6/10, intermittent, and throbbing.  He last had ibuprofen overnight without very much relief.  He denies any fever, chills, chest pain, dizziness, or shortness of breath.  No urinary symptoms, nausea, vomiting, or diarrhea.  No sick contacts, recent travel, or known exposure to COVID-19. [facility] Course: Patient admitted to sickle cell day infusion clinic for management of pain crisis.  Reviewed all laboratory values, LDH markedly elevated at 569.  All of the laboratory values are consistent with patient's baseline. Pain managed with IV Dilaudid PCA, IV Toradol, Tylenol, and IV fluids. Patient's pain intensity decreased to 3/10 and he is requesting discharge home.  He is alert, oriented, and ambulating without assistance.  Patient advised to return to clinic if pain intensity increases overnight. Patient's oxycodone and ibuprofen were sent to his pharmacy.  Discharge instructions: Resume all home medications.   Follow up with PCP as previously  scheduled.   Discussed the importance of drinking 64 ounces of water daily, dehydration of red blood cells may lead further sickling.   Avoid all stressors that precipitate sickle cell pain crisis.     The patient was given clear instructions to go to ER or return to medical center if symptoms do not improve, worsen or new problems develop.   Physical Exam at Discharge:  BP (!) 144/60 (BP Location: Right Arm)   Pulse 71  Temp 98.6 F (37 C) (Temporal)   Resp 18   SpO2 95%   Physical Exam Constitutional:      Appearance: Normal appearance.  Eyes:     Pupils: Pupils are equal, round, and reactive to light.  Cardiovascular:     Rate and Rhythm: Normal rate and regular rhythm.  Pulmonary:     Effort: Pulmonary effort is normal.  Abdominal:     General: Bowel sounds are normal.  Musculoskeletal:        General: Normal range of motion.  Skin:    General:  Skin is warm.  Neurological:     General: No focal deficit present.     Mental Status: He is alert. Mental status is at baseline.      Disposition at Discharge: Discharge disposition: 01-Home or Self Care       Discharge Orders: Discharge Instructions     Discharge patient   Complete by: As directed    Discharge disposition: 01-Home or Self Care   Discharge patient date: 12/17/2022       Condition at Discharge:   Stable  Time spent on Discharge:  Greater than 30 minutes.  Signed: Donia Pounds  APRN, MSN, FNP-C Patient [facility] [address] [place], [place] [address] [phone]  12/17/2022, 4:04 PM

## 2022-12-17 NOTE — Telephone Encounter (Signed)
Pt called day hospital wanting to come in today for sickle cell pain. Pt reports having left arm pain 4/10. Pt states that he is out of his Oxycodone, and sent in request for it last week. Pt reports taking 1,000 mg Tylenol at 5 AM. Pt denies being to Emergency Room. Pt screened for COVID and denies symptoms and exposure. Pt denies N/V/D, abdominal pain, chest pain, and priapism. [place] Hollis, [place] notified and she said pt can come to day hospital for treatment today. Pt notified and verbalized understanding. Pt states that his grandma will be his transportation today.

## 2023-01-21 ENCOUNTER — Ambulatory Visit (INDEPENDENT_AMBULATORY_CARE_PROVIDER_SITE_OTHER): Payer: Medicaid Other | Admitting: Family Medicine

## 2023-01-21 ENCOUNTER — Encounter: Payer: Self-pay | Admitting: Family Medicine

## 2023-01-21 ENCOUNTER — Ambulatory Visit: Payer: Self-pay | Admitting: Family Medicine

## 2023-01-21 VITALS — BP 118/55 | HR 84 | Temp 97.7°F | Ht 67.0 in | Wt 206.4 lb

## 2023-01-21 DIAGNOSIS — E559 Vitamin D deficiency, unspecified: Secondary | ICD-10-CM

## 2023-01-21 DIAGNOSIS — G894 Chronic pain syndrome: Secondary | ICD-10-CM | POA: Diagnosis not present

## 2023-01-21 DIAGNOSIS — D571 Sickle-cell disease without crisis: Secondary | ICD-10-CM | POA: Diagnosis not present

## 2023-01-21 DIAGNOSIS — R809 Proteinuria, unspecified: Secondary | ICD-10-CM

## 2023-01-21 DIAGNOSIS — Z1159 Encounter for screening for other viral diseases: Secondary | ICD-10-CM
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
## 2023-01-22 LAB — CMP14+CBC/D/PLT+FER+RETIC+V...
ALT: 28 IU/L (ref 0–44)
AST: 86 IU/L — ABNORMAL HIGH (ref 0–40)
Albumin/Globulin Ratio: 2.1 (ref 1.2–2.2)
Albumin: 4.5 g/dL (ref 4.3–5.2)
Alkaline Phosphatase: 85 IU/L (ref 44–121)
BUN/Creatinine Ratio: 11 (ref 9–20)
BUN: 8 mg/dL (ref 6–20)
Basophils Absolute: 0.1 10*3/uL (ref 0.0–0.2)
Basos: 1 %
Bilirubin Total: 3.6 mg/dL — ABNORMAL HIGH (ref 0.0–1.2)
CO2: 22 mmol/L (ref 20–29)
Calcium: 9.6 mg/dL (ref 8.7–10.2)
Chloride: 104 mmol/L (ref 96–106)
Creatinine, Ser: 0.7 mg/dL — ABNORMAL LOW (ref 0.76–1.27)
EOS (ABSOLUTE): 0.2 10*3/uL (ref 0.0–0.4)
Eos: 2 %
Ferritin: 119 ng/mL (ref 30–400)
Globulin, Total: 2.1 g/dL (ref 1.5–4.5)
Glucose: 89 mg/dL (ref 70–99)
Hematocrit: 27.6 % — ABNORMAL LOW (ref 37.5–51.0)
Hemoglobin: 9.5 g/dL — ABNORMAL LOW (ref 13.0–17.7)
Immature Grans (Abs): 0 10*3/uL (ref 0.0–0.1)
Immature Granulocytes: 0 %
Lymphocytes Absolute: 3.8 10*3/uL — ABNORMAL HIGH (ref 0.7–3.1)
Lymphs: 37 %
MCH: 29.8 pg (ref 26.6–33.0)
MCHC: 34.4 g/dL (ref 31.5–35.7)
MCV: 87 fL (ref 79–97)
Monocytes Absolute: 1.2 10*3/uL — ABNORMAL HIGH (ref 0.1–0.9)
Monocytes: 12 %
NRBC: 1 % — ABNORMAL HIGH (ref 0–0)
Neutrophils Absolute: 4.9 10*3/uL (ref 1.4–7.0)
Neutrophils: 48 %
Platelets: 426 10*3/uL (ref 150–450)
Potassium: 4.6 mmol/L (ref 3.5–5.2)
RBC: 3.19 x10E6/uL — ABNORMAL LOW (ref 4.14–5.80)
RDW: 20.7 % — ABNORMAL HIGH (ref 11.6–15.4)
Retic Ct Pct: 6.6 % — ABNORMAL HIGH (ref 0.6–2.6)
Sodium: 140 mmol/L (ref 134–144)
Total Protein: 6.6 g/dL (ref 6.0–8.5)
Vit D, 25-Hydroxy: 7.7 ng/mL — ABNORMAL LOW (ref 30.0–100.0)
WBC: 10.2 10*3/uL (ref 3.4–10.8)
eGFR: 133 mL/min/{1.73_m2} (ref >59)

## 2023-01-22 LAB — DRUG SCREEN 764883 11+OXYCO+ALC+CRT-BUND

## 2023-01-22 LAB — HEPATITIS C ANTIBODY: Hep C Virus Ab: NONREACTIVE

## 2023-01-22 NOTE — Progress Notes (Signed)
[name] Hill is a 22 year old male with a medical history significant for sickle cell disease that presented for a 20-monthfollow-up on 01/21/2023.  Reviewed all laboratory values.  Vitamin D is markedly decreased at 7.7.  Goal is to be above 30.  Will restart weekly vitamin D and recheck level within 3 months.  Also, recommend vitamin D fortified foods.  Ensure that patient has a 375-monthollow-up scheduled. LaDonia PoundsAPRN, MSN, FNP-C [facility] [phone]

## 2023-01-26 LAB — CANNABINOID CONFIRMATION, UR
CANNABINOIDS: POSITIVE — AB
Carboxy THC GC/MS Conf: 490 ng/mL

## 2023-01-26 LAB — DRUG SCREEN 764883 11+OXYCO+ALC+CRT-BUND
Amphetamines, Urine: NEGATIVE ng/mL
BENZODIAZ UR QL: NEGATIVE ng/mL
Barbiturate: NEGATIVE ng/mL
Cocaine (Metabolite): NEGATIVE ng/mL
Creatinine: 69.8 mg/dL (ref 20.0–300.0)
Ethanol: NEGATIVE %
Meperidine: NEGATIVE ng/mL
Methadone Screen, Urine: NEGATIVE ng/mL
OPIATE SCREEN URINE: NEGATIVE ng/mL
Oxycodone/Oxymorphone, Urine: NEGATIVE ng/mL
Phencyclidine: NEGATIVE ng/mL
Propoxyphene: NEGATIVE ng/mL
Tramadol: NEGATIVE ng/mL
pH, Urine: 6.5 (ref 4.5–8.9)

## 2023-02-27 ENCOUNTER — Telehealth: Payer: Self-pay | Admitting: Family Medicine

## 2023-02-27 NOTE — Telephone Encounter (Signed)
Caller & Relationship to patient:  MRN #  [id number]   Call Back Number:   Date of Last Office Visit: 01/21/2023     Date of Next Office Visit: 04/22/2023    Medication(s) to be Refilled: Oxycodone   Preferred Pharmacy:   ** Please notify patient to allow 48-72 hours to process** **Let patient know to contact pharmacy at the end of the day to make sure medication is ready. ** **If patient has not been seen in a year or longer, book an appointment **Advise to use MyChart for refill requests OR to contact their pharmacy

## 2023-03-10 ENCOUNTER — Other Ambulatory Visit: Payer: Self-pay

## 2023-03-10 DIAGNOSIS — D571 Sickle-cell disease without crisis: Secondary | ICD-10-CM

## 2023-03-10 DIAGNOSIS — F1190 Opioid use, unspecified, uncomplicated: Secondary | ICD-10-CM

## 2023-03-10 DIAGNOSIS — G894 Chronic pain syndrome: Secondary | ICD-10-CM

## 2023-03-10 MED ORDER — OXYCODONE HCL 10 MG PO TABS: 10.0000 mg | ORAL_TABLET | ORAL | 0 refills | Status: DC | PRN

## 2023-03-10 NOTE — Telephone Encounter (Signed)
From: [name] To: Office of Julianne Handler, [place] Sent: 03/10/2023 11:43 AM EDT Subject: Medication Renewal Request  Refills have been requested for the following medications:   Oxycodone HCl 10 MG TABS [[place]achina Hollis]  Preferred pharmacy: [id number] - [place], [place] - [address] Delivery method: [employer]

## 2023-03-10 NOTE — Telephone Encounter (Signed)
Please advise KH 

## 2023-03-17 ENCOUNTER — Emergency Department (HOSPITAL_BASED_OUTPATIENT_CLINIC_OR_DEPARTMENT_OTHER): Payer: Medicaid Other

## 2023-03-17 ENCOUNTER — Encounter (HOSPITAL_BASED_OUTPATIENT_CLINIC_OR_DEPARTMENT_OTHER): Payer: Self-pay | Admitting: Urology

## 2023-03-17 ENCOUNTER — Other Ambulatory Visit: Payer: Self-pay

## 2023-03-17 ENCOUNTER — Inpatient Hospital Stay (HOSPITAL_BASED_OUTPATIENT_CLINIC_OR_DEPARTMENT_OTHER)
Admission: EM | Admit: 2023-03-17 | Discharge: 2023-03-19 | DRG: 812 | Disposition: A | Discharge status: Home / Self Care | Payer: Medicaid Other | Attending: Internal Medicine | Admitting: Internal Medicine

## 2023-03-17 DIAGNOSIS — Z83438 Family history of other disorder of lipoprotein metabolism and other lipidemia: Secondary | ICD-10-CM | POA: Diagnosis not present

## 2023-03-17 DIAGNOSIS — Z79899 Other long term (current) drug therapy: Secondary | ICD-10-CM | POA: Diagnosis not present

## 2023-03-17 DIAGNOSIS — R0902 Hypoxemia: Secondary | ICD-10-CM | POA: Diagnosis present

## 2023-03-17 DIAGNOSIS — Z8249 Family history of ischemic heart disease and other diseases of the circulatory system: Secondary | ICD-10-CM | POA: Diagnosis not present

## 2023-03-17 DIAGNOSIS — I10 Essential (primary) hypertension: Secondary | ICD-10-CM | POA: Diagnosis present

## 2023-03-17 DIAGNOSIS — D638 Anemia in other chronic diseases classified elsewhere: Secondary | ICD-10-CM | POA: Diagnosis present

## 2023-03-17 DIAGNOSIS — Z8709 Personal history of other diseases of the respiratory system: Secondary | ICD-10-CM

## 2023-03-17 DIAGNOSIS — Z832 Family history of diseases of the blood and blood-forming organs and certain disorders involving the immune mechanism: Secondary | ICD-10-CM | POA: Diagnosis not present

## 2023-03-17 DIAGNOSIS — J4520 Mild intermittent asthma, uncomplicated: Secondary | ICD-10-CM | POA: Diagnosis present

## 2023-03-17 DIAGNOSIS — D5700 Hb-SS disease with crisis, unspecified: Secondary | ICD-10-CM

## 2023-03-17 DIAGNOSIS — F1120 Opioid dependence, uncomplicated: Secondary | ICD-10-CM | POA: Diagnosis present

## 2023-03-17 DIAGNOSIS — Z8616 Personal history of COVID-19: Secondary | ICD-10-CM | POA: Diagnosis not present

## 2023-03-17 DIAGNOSIS — D5701 Hb-SS disease with acute chest syndrome: Secondary | ICD-10-CM | POA: Diagnosis not present

## 2023-03-17 DIAGNOSIS — G894 Chronic pain syndrome: Secondary | ICD-10-CM | POA: Diagnosis present

## 2023-03-17 LAB — CBC WITH DIFFERENTIAL/PLATELET
Abs Immature Granulocytes: 0.61 10*3/uL — ABNORMAL HIGH (ref 0.00–0.07)
Basophils Absolute: 0.2 10*3/uL — ABNORMAL HIGH (ref 0.0–0.1)
Basophils Relative: 1 %
Eosinophils Absolute: 0.3 10*3/uL (ref 0.0–0.5)
Eosinophils Relative: 2 %
HCT: 22.3 % — ABNORMAL LOW (ref 39.0–52.0)
Hemoglobin: 8.2 g/dL — ABNORMAL LOW (ref 13.0–17.0)
Immature Granulocytes: 4 %
Lymphocytes Relative: 43 %
Lymphs Abs: 6.9 10*3/uL — ABNORMAL HIGH (ref 0.7–4.0)
MCH: 29.9 pg (ref 26.0–34.0)
MCHC: 36.8 g/dL — ABNORMAL HIGH (ref 30.0–36.0)
MCV: 81.4 fL (ref 80.0–100.0)
Monocytes Absolute: 1.5 10*3/uL — ABNORMAL HIGH (ref 0.1–1.0)
Monocytes Relative: 10 %
Neutro Abs: 6.1 10*3/uL (ref 1.7–7.7)
Neutrophils Relative %: 40 %
Platelets: 333 10*3/uL (ref 150–400)
RBC: 2.74 MIL/uL — ABNORMAL LOW (ref 4.22–5.81)
RDW: 22.3 % — ABNORMAL HIGH (ref 11.5–15.5)
WBC: 15.6 10*3/uL — ABNORMAL HIGH (ref 4.0–10.5)
nRBC: 1.1 % — ABNORMAL HIGH (ref 0.0–0.2)

## 2023-03-17 LAB — COMPREHENSIVE METABOLIC PANEL
ALT: 20 U/L (ref 0–44)
AST: 54 U/L — ABNORMAL HIGH (ref 15–41)
Albumin: 4.3 g/dL (ref 3.5–5.0)
Alkaline Phosphatase: 72 U/L (ref 38–126)
Anion gap: 10 (ref 5–15)
BUN: 12 mg/dL (ref 6–20)
CO2: 20 mmol/L — ABNORMAL LOW (ref 22–32)
Calcium: 8.7 mg/dL — ABNORMAL LOW (ref 8.9–10.3)
Chloride: 102 mmol/L (ref 98–111)
Creatinine, Ser: 0.7 mg/dL (ref 0.61–1.24)
GFR, Estimated: >60 mL/min (ref >60)
Glucose, Bld: 92 mg/dL (ref 70–99)
Potassium: 4.3 mmol/L (ref 3.5–5.1)
Sodium: 132 mmol/L — ABNORMAL LOW (ref 135–145)
Total Bilirubin: 5.3 mg/dL — ABNORMAL HIGH (ref 0.3–1.2)
Total Protein: 7.2 g/dL (ref 6.5–8.1)

## 2023-03-17 LAB — RETICULOCYTES
Immature Retic Fract: 48.6 % — ABNORMAL HIGH (ref 2.3–15.9)
RBC.: 2.75 MIL/uL — ABNORMAL LOW (ref 4.22–5.81)
Retic Count, Absolute: 204.1 10*3/uL — ABNORMAL HIGH (ref 19.0–186.0)
Retic Ct Pct: 7.4 % — ABNORMAL HIGH (ref 0.4–3.1)

## 2023-03-17 MED ORDER — ACETAMINOPHEN 325 MG PO TABS
650.0000 mg | ORAL_TABLET | Freq: Once | ORAL | Status: AC
  Administered 2023-03-17: 650 mg via ORAL
  Filled 2023-03-17: qty 2

## 2023-03-17 MED ORDER — SODIUM CHLORIDE 0.45 % IV SOLN: INTRAVENOUS | Status: AC

## 2023-03-17 MED ORDER — POLYETHYLENE GLYCOL 3350 17 G PO PACK: 17.0000 g | PACK | Freq: Every day | ORAL | Status: DC | PRN

## 2023-03-17 MED ORDER — SENNOSIDES-DOCUSATE SODIUM 8.6-50 MG PO TABS
1.0000 | ORAL_TABLET | Freq: Two times a day (BID) | ORAL | Status: DC
  Administered 2023-03-17 – 2023-03-19 (×4): 1 via ORAL
  Filled 2023-03-17 (×4): qty 1

## 2023-03-17 MED ORDER — HYDROMORPHONE HCL 1 MG/ML IJ SOLN
0.5000 mg | INTRAMUSCULAR | Status: DC
  Filled 2023-03-17: qty 1

## 2023-03-17 MED ORDER — NALOXONE HCL 0.4 MG/ML IJ SOLN: 0.4000 mg | INTRAMUSCULAR | Status: DC | PRN

## 2023-03-17 MED ORDER — IOHEXOL 350 MG/ML SOLN
100.0000 mL | Freq: Once | INTRAVENOUS | Status: AC | PRN
  Administered 2023-03-17: 100 mL via INTRAVENOUS

## 2023-03-17 MED ORDER — SODIUM CHLORIDE 0.9% FLUSH: 9.0000 mL | INTRAVENOUS | Status: DC | PRN

## 2023-03-17 MED ORDER — DIPHENHYDRAMINE HCL 50 MG/ML IJ SOLN
12.5000 mg | Freq: Once | INTRAMUSCULAR | Status: AC
  Administered 2023-03-17: 12.5 mg via INTRAVENOUS
  Filled 2023-03-17: qty 1

## 2023-03-17 MED ORDER — DIPHENHYDRAMINE HCL 25 MG PO CAPS: 25.0000 mg | ORAL_CAPSULE | ORAL | Status: DC | PRN

## 2023-03-17 MED ORDER — OXYCODONE HCL 5 MG PO TABS
10.0000 mg | ORAL_TABLET | Freq: Once | ORAL | Status: DC
  Filled 2023-03-17: qty 2

## 2023-03-17 MED ORDER — HYDROMORPHONE 1 MG/ML IV SOLN
INTRAVENOUS | Status: DC
  Administered 2023-03-17: 30 mg via INTRAVENOUS
  Administered 2023-03-18: 3.5 mg via INTRAVENOUS
  Administered 2023-03-18: 3 mg via INTRAVENOUS
  Administered 2023-03-18: 0.5 mg via INTRAVENOUS
  Administered 2023-03-18: 4.5 mg via INTRAVENOUS
  Administered 2023-03-18 – 2023-03-19 (×2): 1 mg via INTRAVENOUS
  Filled 2023-03-17: qty 30

## 2023-03-17 MED ORDER — HYDROMORPHONE HCL 1 MG/ML IJ SOLN
0.5000 mg | INTRAMUSCULAR | Status: AC
  Administered 2023-03-17: 0.5 mg via SUBCUTANEOUS
  Filled 2023-03-17: qty 1

## 2023-03-17 MED ORDER — ONDANSETRON HCL 4 MG/2ML IJ SOLN: 4.0000 mg | Freq: Four times a day (QID) | INTRAMUSCULAR | Status: DC | PRN

## 2023-03-17 MED ORDER — KETOROLAC TROMETHAMINE 15 MG/ML IJ SOLN
15.0000 mg | Freq: Four times a day (QID) | INTRAMUSCULAR | Status: DC
  Administered 2023-03-17 – 2023-03-19 (×5): 15 mg via INTRAVENOUS
  Filled 2023-03-17 (×5): qty 1

## 2023-03-17 MED ORDER — KETOROLAC TROMETHAMINE 15 MG/ML IJ SOLN
15.0000 mg | Freq: Once | INTRAMUSCULAR | Status: AC
  Administered 2023-03-17: 15 mg via INTRAVENOUS
  Filled 2023-03-17: qty 1

## 2023-03-17 MED ORDER — HYDROMORPHONE HCL 1 MG/ML IJ SOLN
1.0000 mg | Freq: Once | INTRAMUSCULAR | Status: AC
  Administered 2023-03-17: 1 mg via INTRAVENOUS
  Filled 2023-03-17: qty 1

## 2023-03-17 NOTE — Progress Notes (Signed)
Jamarco Zaldivar is a 22 year old male with a medical history significant for sickle cell disease, chronic pain syndrome, opiate dependence, anemia of chronic disease, and mild intermittent asthma presented to the emergency department with complaints of worsening pain to chest, back, and right shoulder.  Patient also experienced some shortness of breath.  Course notable for hypoxia.  Pain persists despite IV Dilaudid and IV fluids.  Agreed to accept patient for admission at [facility].  Signed and held orders placed, awaiting arrival.  Nolon Nations  APRN, MSN, FNP-C [facility] [address] [place], [place] [address] [phone]

## 2023-03-17 NOTE — ED Notes (Signed)
No call back, attempted to call report again, left on hold for >77min

## 2023-03-17 NOTE — ED Notes (Signed)
Attmpt at IV but could not advance past scar tissue, labs obtained but no access

## 2023-03-17 NOTE — ED Notes (Signed)
Attmpt at 2nd IV with no access, unable to push past scar tissue on LAC

## 2023-03-17 NOTE — ED Provider Notes (Signed)
[place] [facility] AT [facility] Provider Note   CSN: [id number] Arrival date & time: 03/17/23  1235     History  Chief Complaint  Patient presents with   Sickle Cell Pain Crisis    [name] Hill is a 22 y.o. male presenting with a sickle cell pain crises.  Reports that [name] Hill got home from work early this morning and all of a sudden had sudden onset chest, back and right shoulder pain.  Had some associated shortness of breath.  No history of DVT/PE.  Is not on a blood thinner.  No recent travel or surgery.  No swelling in either of his legs.  Takes oxycodone and ibuprofen at home   Sickle Cell Pain Crisis Associated symptoms: shortness of breath        Home Medications Prior to Admission medications   Medication Sig Start Date End Date Taking? Authorizing Provider  albuterol (VENTOLIN HFA) 108 (90 Base) MCG/ACT inhaler Inhale 2 puffs into the lungs every 6 (six) hours as needed for wheezing or shortness of breath. 10/08/22   Massie Maroon, FNP  folic acid (FOLVITE) 1 MG tablet Take 1 tablet (1 mg total) by mouth daily. 04/10/22 04/10/23  Massie Maroon, FNP  ibuprofen (ADVIL) 800 MG tablet Take 1 tablet (800 mg total) by mouth every 8 (eight) hours as needed. 12/17/22   Massie Maroon, FNP  montelukast (SINGULAIR) 10 MG tablet Take 1 tablet (10 mg total) by mouth at bedtime. 10/08/22 10/08/23  Massie Maroon, FNP  Oxycodone HCl 10 MG TABS Take 1 tablet (10 mg total) by mouth every 4 (four) hours as needed (pain). 03/10/23   Massie Maroon, FNP  Vitamin D, Ergocalciferol, (DRISDOL) 1.25 MG (50000 UNIT) CAPS capsule Take 1 capsule (50,000 Units total) by mouth every 7 (seven) days. 10/25/22   Donell Beers, FNP      Allergies    Lisinopril, Pork-derived products, and Shellfish allergy    Review of Systems   Review of Systems  Respiratory:  Positive for shortness of breath. Negative for chest tightness.   Musculoskeletal:  Positive for  back pain and myalgias.    Physical Exam Updated Vital Signs BP (!) 147/81 (BP Location: Left Arm)   Pulse 69   Temp 98 F (36.7 C)   Resp 20   Ht 5\' 7"  (1.702 m)   Wt 99.8 kg   SpO2 96%   BMI 34.46 kg/m  Physical Exam Vitals and nursing note reviewed.  Constitutional:      General: [name] Hill is not in acute distress.    Appearance: Normal appearance. [name] Hill is not ill-appearing.  HENT:     Head: Normocephalic and atraumatic.  Eyes:     General: No scleral icterus.    Conjunctiva/sclera: Conjunctivae normal.  Cardiovascular:     Rate and Rhythm: Normal rate and regular rhythm.  Pulmonary:     Effort: Pulmonary effort is normal. No respiratory distress.     Breath sounds: No wheezing.  Chest:     Chest wall: Tenderness present.  Musculoskeletal:     Right lower leg: No edema.     Left lower leg: No edema.  Skin:    Findings: No rash.  Neurological:     Mental Status: [name] Hill is alert.  Psychiatric:        Mood and Affect: Mood normal.     ED Results / Procedures / Treatments   Labs (all labs ordered are listed, but only abnormal  results are displayed) Labs Reviewed  COMPREHENSIVE METABOLIC PANEL - Abnormal; Notable for the following components:      Result Value   Sodium 132 (*)    CO2 20 (*)    Calcium 8.7 (*)    AST 54 (*)    Total Bilirubin 5.3 (*)    All other components within normal limits  CBC WITH DIFFERENTIAL/PLATELET - Abnormal; Notable for the following components:   WBC 15.6 (*)    RBC 2.74 (*)    Hemoglobin 8.2 (*)    HCT 22.3 (*)    MCHC 36.8 (*)    RDW 22.3 (*)    nRBC 1.1 (*)    Lymphs Abs 6.9 (*)    Monocytes Absolute 1.5 (*)    Basophils Absolute 0.2 (*)    Abs Immature Granulocytes 0.61 (*)    All other components within normal limits  RETICULOCYTES - Abnormal; Notable for the following components:   Retic Ct Pct 7.4 (*)    RBC. 2.75 (*)    Retic Count, Absolute 204.1 (*)    Immature Retic Fract 48.6 (*)    All other components within normal  limits    EKG EKG Interpretation  Date/Time:  Monday March 17 2023 12:44:31 EDT Ventricular Rate:  76 PR Interval:  169 QRS Duration: 102 QT Interval:  385 QTC Calculation: 433 R Axis:   82 Text Interpretation: Sinus rhythm Baseline wander in lead(s) III No significant change was found Confirmed by Glynn Octave [phone]) on 03/17/2023 12:50:27 PM  Radiology DG Chest 2 View  Result Date: 03/17/2023 CLINICAL DATA:  Provided history: Hypoxia. Chest/back pain. EXAM: CHEST - 2 VIEW COMPARISON:  Prior chest radiographs 10/03/2022 and earlier. Chest CT 06/09/2022. FINDINGS: Heart size within normal limits. Small focus of atelectasis and/or scarring within the lateral left lung base. No appreciable airspace consolidation. No evidence of pleural effusion or pneumothorax. Multilevel vertebral body height loss/endplate deformities within the thoracic spine consistent with sequelae of sickle cell disease. IMPRESSION: 1. No evidence of airspace consolidation. 2. Small focus of atelectasis and/or scarring within the lateral left lung base. 3. Multilevel vertebral body height loss/endplate deformities within the thoracic spine consistent with sequelae of sickle cell disease. Electronically Signed   By: Jackey Loge D.O.   On: 03/17/2023 13:23    Procedures Procedures   Medications Ordered in ED Medications  HYDROmorphone (DILAUDID) injection 0.5 mg (0.5 mg Subcutaneous Given 03/17/23 1341)  HYDROmorphone (DILAUDID) injection 1 mg (1 mg Intravenous Given 03/17/23 1431)  ketorolac (TORADOL) 15 MG/ML injection 15 mg (15 mg Intravenous Given 03/17/23 1431)  acetaminophen (TYLENOL) tablet 650 mg (650 mg Oral Given 03/17/23 1428)  diphenhydrAMINE (BENADRYL) injection 12.5 mg (12.5 mg Intravenous Given 03/17/23 1431)  iohexol (OMNIPAQUE) 350 MG/ML injection 100 mL (100 mLs Intravenous Contrast Given 03/17/23 1446)    ED Course/ Medical Decision Making/ A&P Clinical Course as of 03/17/23 1515  Mon Mar 17, 2023  1429 Patient desaturated to 85% on room air without O2. Placed on 3L to stay above 93% [MR]    Clinical Course User Index [MR] Gracelynne Benedict, Gabriel Cirri, PA-C                             Medical Decision Making Amount and/or Complexity of Data Reviewed Labs: ordered. Radiology: ordered.  Risk OTC drugs. Prescription drug management. Decision regarding hospitalization.   22 year old male presenting today with chest pain, back pain and shortness of breath.  Does have a history of sickle cell.  Differential is not limited to acute chest, PE, ACS, dissection, pneumonia, pneumothorax.  This is not an exhaustive differential.    Past Medical History / Co-morbidities / Social History: Sickle cell disease   Additional history: Not anticoagulated, has had multiple PE studies over the years which have all been negative   Physical Exam: Pertinent physical exam findings include Lung sounds clear, not tachycardic.  Resting comfortably  Lab Tests: I ordered, and personally interpreted labs.  The pertinent results include: WBC 15.6 Hemoglobin 8.2, not far from patient's baseline Reticulocyte count 7.4, slightly above baseline T. bili 5.3   Imaging Studies: Chest x-ray with atelectasis/scarring   CT angiogram negative  Cardiac Monitoring:  The patient was maintained on a cardiac monitor.  I viewed and interpreted the cardiac monitored which showed an underlying rhythm of: sinus   Medications: Dilaudid, Tylenol Compazine, Benadryl, chest pain with some improvement of his discomfort   MDM/Disposition: This is a 22 year old male who presented today with sickle cell pain crises to the chest, back and shoulder.  [name] Hill also had some shortness of breath.  Hypoxic on arrival to 88%.  Subsequently dropped to 87%.  Was placed on 2 L to maintain his oxygen saturations.  CT angiogram without signs of PE.  Patient will require admission for hypoxia in the setting of sickle cell disease. [place]  with sickle cell team is the accepting.  Likely will be Dr. Hyman Hopes as the attending physician.     Final Clinical Impression(s) / ED Diagnoses Final diagnoses:  Sickle cell crisis  Acute chest syndrome due to sickle cell crisis    Rx / DC Orders ED Discharge Orders     None        I discussed this case with my attending physician Dr. Manus Gunning who cosigned this note including patient's presenting symptoms, physical exam, and planned diagnostics and interventions. Attending physician stated agreement with plan or made changes to plan which were implemented.       Woodroe Chen 03/17/23 1544    Glynn Octave, MD 03/17/23 1704

## 2023-03-17 NOTE — ED Notes (Signed)
Report rec'd from prev RN 

## 2023-03-17 NOTE — ED Triage Notes (Signed)
Pt states chest pain/back pain that started after getting off work today, states full body pain   States feels like a sickle cell crisis  Took ibuprofen and oxycodone at 0900 with little relief

## 2023-03-17 NOTE — ED Notes (Signed)
Spo2 low of 87% on RA, 2 L [place] applied, spo2 up to 93%, RT notified

## 2023-03-17 NOTE — ED Notes (Signed)
[facility] on the floor, paperwork given, pt belongings including shoes, clothes & wallet put into labeled belongings bag and sent with pt

## 2023-03-17 NOTE — ED Notes (Signed)
Attempted to call report, RN unavailable, req to c/b

## 2023-03-18 DIAGNOSIS — D5700 Hb-SS disease with crisis, unspecified: Secondary | ICD-10-CM

## 2023-03-18 LAB — BASIC METABOLIC PANEL
Anion gap: 8 (ref 5–15)
BUN: 10 mg/dL (ref 6–20)
CO2: 24 mmol/L (ref 22–32)
Calcium: 8.3 mg/dL — ABNORMAL LOW (ref 8.9–10.3)
Chloride: 104 mmol/L (ref 98–111)
Creatinine, Ser: 0.67 mg/dL (ref 0.61–1.24)
GFR, Estimated: >60 mL/min (ref >60)
Glucose, Bld: 77 mg/dL (ref 70–99)
Potassium: 3.9 mmol/L (ref 3.5–5.1)
Sodium: 136 mmol/L (ref 135–145)

## 2023-03-18 LAB — CBC
HCT: 21.5 % — ABNORMAL LOW (ref 39.0–52.0)
Hemoglobin: 7.9 g/dL — ABNORMAL LOW (ref 13.0–17.0)
MCH: 30.3 pg (ref 26.0–34.0)
MCHC: 36.7 g/dL — ABNORMAL HIGH (ref 30.0–36.0)
MCV: 82.4 fL (ref 80.0–100.0)
Platelets: 315 10*3/uL (ref 150–400)
RBC: 2.61 MIL/uL — ABNORMAL LOW (ref 4.22–5.81)
RDW: 21.8 % — ABNORMAL HIGH (ref 11.5–15.5)
WBC: 11.8 10*3/uL — ABNORMAL HIGH (ref 4.0–10.5)
nRBC: 1.1 % — ABNORMAL HIGH (ref 0.0–0.2)

## 2023-03-18 NOTE — Plan of Care (Signed)
  Problem: Education: Goal: Knowledge of vaso-occlusive preventative measures will improve Outcome: Progressing   Problem: Self-Care: Goal: Ability to incorporate actions that prevent/reduce pain crisis will improve Outcome: Progressing   Problem: Bowel/Gastric: Goal: Gut motility will be maintained Outcome: Progressing   Problem: Respiratory: Goal: Pulmonary complications will be avoided or minimized Outcome: Progressing   Problem: Fluid Volume: Goal: Ability to maintain a balanced intake and output will improve Outcome: Progressing   Problem: Sensory: Goal: Pain level will decrease with appropriate interventions Outcome: Progressing   Problem: Health Behavior: Goal: Postive changes in compliance with treatment and prescription regimens will improve Outcome: Progressing

## 2023-03-18 NOTE — TOC Progression Note (Signed)
Transition of Care [facility]) - Progression Note    Patient Details  Name: [name] Hill MRN: [id number] Date of Birth: [birth year]  Transition of Care [facility]) CM/SW Contact  Beckie Busing, RN Phone [phone]  03/18/2023, 1:08 PM  Clinical Narrative:     Transition of Care [facility]) Screening Note   Patient Details  Name: [name] Hill Date of Birth: 2000/12/06   Transition of Care [facility]) CM/SW Contact:    Beckie Busing, RN Phone Number: 03/18/2023, 1:08 PM    Transition of Care Department [facility]) has reviewed patient and no TOC needs have been identified at this time. We will continue to monitor patient advancement through interdisciplinary progression rounds. If new patient transition needs arise, please place a TOC consult.          Expected Discharge Plan and Services                                               Social Determinants of Health (SDOH) Interventions SDOH Screenings   Food Insecurity: No Food Insecurity (03/18/2023)  Housing: Low Risk  (03/18/2023)  Transportation Needs: No Transportation Needs (03/18/2023)  Utilities: Not At Risk (03/18/2023)  Depression (PHQ2-9): Low Risk  (01/21/2023)  Tobacco Use: Low Risk  (03/17/2023)    Readmission Risk Interventions     No data to display

## 2023-03-18 NOTE — Plan of Care (Signed)
  Problem: Safety: Goal: Ability to remain free from injury will improve Outcome: Progressing   Problem: Pain Managment: Goal: General experience of comfort will improve Outcome: Progressing   Problem: Bowel/Gastric: Goal: Gut motility will be maintained Outcome: Progressing
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
## 2023-03-19 ENCOUNTER — Encounter: Payer: Self-pay | Admitting: Family Medicine

## 2023-03-19 DIAGNOSIS — D5700 Hb-SS disease with crisis, unspecified: Secondary | ICD-10-CM | POA: Diagnosis not present

## 2023-03-19 LAB — CBC
HCT: 21.5 % — ABNORMAL LOW (ref 39.0–52.0)
Hemoglobin: 8.3 g/dL — ABNORMAL LOW (ref 13.0–17.0)
MCH: 31 pg (ref 26.0–34.0)
MCHC: 38.6 g/dL — ABNORMAL HIGH (ref 30.0–36.0)
MCV: 80.2 fL (ref 80.0–100.0)
Platelets: 340 10*3/uL (ref 150–400)
RBC: 2.68 MIL/uL — ABNORMAL LOW (ref 4.22–5.81)
RDW: 21.7 % — ABNORMAL HIGH (ref 11.5–15.5)
WBC: 10.1 10*3/uL (ref 4.0–10.5)
nRBC: 1.5 % — ABNORMAL HIGH (ref 0.0–0.2)

## 2023-03-19 LAB — BASIC METABOLIC PANEL
Anion gap: 7 (ref 5–15)
BUN: 10 mg/dL (ref 6–20)
CO2: 24 mmol/L (ref 22–32)
Calcium: 8.3 mg/dL — ABNORMAL LOW (ref 8.9–10.3)
Chloride: 107 mmol/L (ref 98–111)
Creatinine, Ser: 0.58 mg/dL — ABNORMAL LOW (ref 0.61–1.24)
GFR, Estimated: >60 mL/min (ref >60)
Glucose, Bld: 101 mg/dL — ABNORMAL HIGH (ref 70–99)
Potassium: 4.2 mmol/L (ref 3.5–5.1)
Sodium: 138 mmol/L (ref 135–145)

## 2023-03-19 NOTE — Discharge Summary (Addendum)
Physician Discharge Summary  [name] Hill ZOX:[id number] DOB: [birth year] DOA: 03/17/2023  PCP: [name] Maroon, FNP  Admit date: 03/17/2023  Discharge date: 03/19/2023  Discharge Diagnoses:  Principal Problem:   Sickle cell pain crisis   Discharge Condition: Stable  Disposition:   Follow-up Information     [name] Maroon, FNP Follow up.   Specialty: Family Medicine Contact information: [address] [place] [address] [phone]                Pt is discharged home in good condition and is to follow up with [name] Maroon, FNP this week to have labs evaluated. [name] Hill is instructed to increase activity slowly and balance with rest for the next few days, and use prescribed medication to complete treatment of pain  Diet: Regular Wt Readings from Last 3 Encounters:  03/17/23 99.8 kg  01/21/23 93.6 kg  10/23/22 86.4 kg    History of present illness:  [name] Hill  is a 22 y.o. male medical history significant for sickle cell disease, anemia of chronic disease, and mild intermittent asthma presented to [facility] with complaints of allover body pain that started this morning.  Patient states that as he was leaving work, his pain intensity started to increase.  He attributes it to cold weather.  Patient states that he is hurting "all over".  Pain intensity is 8/10.  Patient says that pain intensity was unrelieved by home oxycodone.  During ER course, patient desaturated to 84%.  Maintained above 90% on 2 L supplemental oxygen.  Patient denies any chest pain, shortness of breath, or fatigue.  No urinary symptoms, nausea, vomiting, or diarrhea.  No dizziness or headache.  No sick contacts, recent travel, or known exposure to COVID-19.  ER course: Complete blood count notable for WBCs 11.8, hemoglobin 7.9 g/dL, and 086,578.  BMP unremarkable.  Patient's pain for this is despite IV Dilaudid, Toradol, and IV fluids.  CTA negative  for pulmonary emboli, shows chronic cardiomegaly, mild chronic scarring at the lung bases, and chronic bone changes of sickle cell disease.  Patient admitted to [facility] for further management of his sickle cell pain crisis.  Hospital Course:  Sickle cell disease with pain crisis: Patient was admitted for sickle cell pain crisis and managed appropriately with IVF, IV Dilaudid via PCA and IV Toradol, as well as other adjunct therapies per sickle cell pain management protocols.  Today, patient states that pain intensity has improved tremendously and he is requesting discharge home.  He rates his pain as 3/10 primarily to his low back.  He states that he can manage at home on home medication regimen. Patient is alert, oriented, and ambulating without assistance.  Patient was therefore discharged home today in a hemodynamically stable condition.   [name] Hill will follow-up with PCP within 1 week of this discharge. [name] Hill was counseled extensively about nonpharmacologic means of pain management, patient verbalized understanding and was appreciative of  the care received during this admission.   We discussed the need for good hydration, monitoring of hydration status, avoidance of heat, cold, stress, and infection triggers.  Patient was reminded of the need to seek medical attention immediately if any symptom of bleeding, anemia, or infection occurs.  Discharge Exam: Vitals:   03/19/23 1000 03/19/23 1012  BP:    Pulse:    Resp:    Temp:    SpO2: 91% 91%   Vitals:   03/19/23 0727 03/19/23 0955  03/19/23 1000 03/19/23 1012  BP:  (!) 110/52    Pulse:  60    Resp: 16 18    Temp:  97.9 F (36.6 C)    TempSrc:  Oral    SpO2: 92% 95% 91% 91%  Weight:      Height:        General appearance : Awake, alert, not in any distress. Speech Clear. Not toxic looking HEENT: Atraumatic and Normocephalic, pupils equally reactive to light and accomodation Neck: Supple, no JVD. No cervical  lymphadenopathy.  Chest: Good air entry bilaterally, no added sounds  CVS: S1 S2 regular, no murmurs.  Abdomen: Bowel sounds present, Non tender and not distended with no gaurding, rigidity or rebound. Extremities: B/L Lower Ext shows no edema, both legs are warm to touch Neurology: Awake alert, and oriented X 3, CN II-XII intact, Non focal Skin: No Rash  Discharge Instructions  Discharge Instructions     Discharge patient   Complete by: As directed    Discharge disposition: 01-Home or Self Care   Discharge patient date: 03/19/2023      Allergies as of 03/19/2023       Reactions   Lisinopril Cough   Pork-derived Products Other (See Comments)   Patient does eat pork for religious reasons   Shellfish Allergy Other (See Comments)   Pt does not eat shellfish for religious reasons        Medication List     TAKE these medications    albuterol 108 (90 Base) MCG/ACT inhaler Commonly known as: VENTOLIN HFA Inhale 2 puffs into the lungs every 6 (six) hours as needed for wheezing or shortness of breath.   folic acid 1 MG tablet Commonly known as: FOLVITE Take 1 tablet (1 mg total) by mouth daily.   ibuprofen 800 MG tablet Commonly known as: ADVIL Take 1 tablet (800 mg total) by mouth every 8 (eight) hours as needed.   losartan 50 MG tablet Commonly known as: COZAAR Take 50 mg by mouth daily.   montelukast 10 MG tablet Commonly known as: Singulair Take 1 tablet (10 mg total) by mouth at bedtime.   Oxycodone HCl 10 MG Tabs Take 1 tablet (10 mg total) by mouth every 4 (four) hours as needed (pain).   Vitamin D (Ergocalciferol) 1.25 MG (50000 UNIT) Caps capsule Commonly known as: DRISDOL Take 1 capsule (50,000 Units total) by mouth every 7 (seven) days.        The results of significant diagnostics from this hospitalization (including imaging, microbiology, ancillary and laboratory) are listed below for reference.    Significant Diagnostic Studies: CT Angio Chest  PE W and/or Wo Contrast  Result Date: 03/17/2023 CLINICAL DATA:  Pulmonary embolism suspected. High probability. Chest and back pain beginning today. Patient with sickle cell disease. EXAM: CT ANGIOGRAPHY CHEST WITH CONTRAST TECHNIQUE: Multidetector CT imaging of the chest was performed using the standard protocol during bolus administration of intravenous contrast. Multiplanar CT image reconstructions and MIPs were obtained to evaluate the vascular anatomy. RADIATION DOSE REDUCTION: This exam was performed according to the departmental dose-optimization program which includes automated exposure control, adjustment of the mA and/or kV according to patient size and/or use of iterative reconstruction technique. CONTRAST:  OMNIPAQUE IOHEXOL 350 MG/ML SOLN COMPARISON:  Chest radiography same day.  CT 06/09/2022. FINDINGS: Cardiovascular: Chronic cardiomegaly. No visible coronary artery calcification or aortic atherosclerotic calcification. No aortic aneurysm. Pulmonary arterial opacification is moderate but acceptable. There are no pulmonary emboli. 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 No evidence of mediastinal mass. Few small calcified nodes in the right hilum. Lungs/Pleura: No pleural effusion on either side. Mild chronic scarring at the lung bases no definite acute pulmonary process. The appearance of the lungs is considerably improved compared to the study of last July. Previous effusions have resolved. Upper Abdomen: Negative Musculoskeletal: Chronic bone changes of sickle cell disease with sclerosis and biconcave vertebral body endplates. No change in appearance since the prior exam. Review of the MIP images confirms the above findings. IMPRESSION: 1. No pulmonary emboli. 2. Chronic cardiomegaly. 3. Mild chronic scarring at the lung bases. 4. Chronic bone changes of sickle cell disease. Electronically Signed   By: Paulina Fusi M.D.   On: 03/17/2023 15:06   DG Chest 2  View  Result Date: 03/17/2023 CLINICAL DATA:  Provided history: Hypoxia. Chest/back pain. EXAM: CHEST - 2 VIEW COMPARISON:  Prior chest radiographs 10/03/2022 and earlier. Chest CT 06/09/2022. FINDINGS: Heart size within normal limits. Small focus of atelectasis and/or scarring within the lateral left lung base. No appreciable airspace consolidation. No evidence of pleural effusion or pneumothorax. Multilevel vertebral body height loss/endplate deformities within the thoracic spine consistent with sequelae of sickle cell disease. IMPRESSION: 1. No evidence of airspace consolidation. 2. Small focus of atelectasis and/or scarring within the lateral left lung base. 3. Multilevel vertebral body height loss/endplate deformities within the thoracic spine consistent with sequelae of sickle cell disease. Electronically Signed   By: Jackey Loge D.O.   On: 03/17/2023 13:23    Microbiology: No results found for this or any previous visit (from the past 240 hour(s)).   Labs: Basic Metabolic Panel: Recent Labs  Lab 03/17/23 1242 03/18/23 0716 03/19/23 0801  NA 132* 136 138  K 4.3 3.9 4.2  CL 102 104 107  CO2 20* 24 24  GLUCOSE 92 77 101*  BUN 12 10 10   CREATININE 0.70 0.67 0.58*  CALCIUM 8.7* 8.3* 8.3*   Liver Function Tests: Recent Labs  Lab 03/17/23 1242  AST 54*  ALT 20  ALKPHOS 72  BILITOT 5.3*  PROT 7.2  ALBUMIN 4.3   No results for input(s): "LIPASE", "AMYLASE" in the last 168 hours. No results for input(s): "AMMONIA" in the last 168 hours. CBC: Recent Labs  Lab 03/17/23 1242 03/18/23 0716 03/19/23 0801  WBC 15.6* 11.8* 10.1  NEUTROABS 6.1  --   --   HGB 8.2* 7.9* 8.3*  HCT 22.3* 21.5* 21.5*  MCV 81.4 82.4 80.2  PLT 333 315 340   Cardiac Enzymes: No results for input(s): "CKTOTAL", "CKMB", "CKMBINDEX", "TROPONINI" in the last 168 hours. BNP: Invalid input(s): "POCBNP" CBG: No results for input(s): "GLUCAP" in the last 168 hours.  Time coordinating discharge: 30  minutes  Signed:  Nolon Nations  APRN, MSN, FNP-C Patient Care [facility] [address] [place], [place] [address] [phone]  [facility] 03/19/2023, 11:24 AM

## 2023-03-19 NOTE — Progress Notes (Signed)
Mobility Specialist - Progress Note   03/19/23 1012  Oxygen Therapy  SpO2 91 %  O2 Device Room Air  Mobility  Activity Ambulated with assistance in hallway  Level of Assistance Independent after set-up  Assistive Device None  Distance Ambulated (ft) 500 ft  Activity Response Tolerated well  Mobility Referral Yes  $Mobility charge 1 Mobility   Pt received in bed and agreed to mobility.  Nurse requested Mobility Specialist to perform oxygen saturation test with pt which includes removing pt from oxygen both at rest and while ambulating.  Below are the results from that testing.     Patient Saturations on Room Air at Rest = spO2 92%  Patient Saturations on Room Air while Ambulating = sp02 92% .    At end of testing pt left in room on Room Air.  Reported results to nurse.   Pt had no issues throughout session and returned to bed with all needs met.  Marilynne Halsted Mobility Specialist

## 2023-03-20 ENCOUNTER — Telehealth: Payer: Self-pay

## 2023-03-20 NOTE — Transitions of Care (Post Inpatient/ED Visit) (Signed)
   03/20/2023  Name: [name] Hill MRN: [id number] DOB: Jan 06, 2001  Today's TOC FU Call Status: Today's TOC FU Call Status:: Successful TOC FU Call Competed TOC FU Call Complete Date: 03/20/23  Transition Care Management Follow-up Telephone Call Date of Discharge: 03/19/23 Discharge Facility: [facility]) Type of Discharge: Inpatient Admission Primary Inpatient Discharge Diagnosis:: HbSS How have you been since you were released from the hospital?: Better Any questions or concerns?: No  Items Reviewed: Did you receive and understand the discharge instructions provided?: Yes Medications obtained and verified?: Yes (Medications Reviewed) Any new allergies since your discharge?: No Dietary orders reviewed?: Yes Do you have support at home?: No  Home Care and Equipment/Supplies: Were Home Health Services Ordered?: NA Any new equipment or medical supplies ordered?: NA  Functional Questionnaire: Do you need assistance with bathing/showering or dressing?: No Do you need assistance with meal preparation?: No Do you need assistance with eating?: No Do you have difficulty maintaining continence: No Do you need assistance with getting out of bed/getting out of a chair/moving?: No Do you have difficulty managing or taking your medications?: No  Follow up appointments reviewed: PCP Follow-up appointment confirmed?: Yes Date of PCP follow-up appointment?: 03/25/23 Follow-up Provider: Cresenciano Genre Follow-up appointment confirmed?: NA Do you need transportation to your follow-up appointment?: No Do you understand care options if your condition(s) worsen?: Yes-patient verbalized understanding    SIGNATURE Karena Addison, LPN [facility] Nurse Health Advisor Direct Dial [phone]

## 2023-03-21 DIAGNOSIS — Z8709 Personal history of other diseases of the respiratory system: Secondary | ICD-10-CM

## 2023-03-25 ENCOUNTER — Inpatient Hospital Stay: Payer: Medicaid Other | Admitting: Family Medicine

## 2023-03-27 ENCOUNTER — Inpatient Hospital Stay: Payer: Medicaid Other | Admitting: Family Medicine

## 2023-04-22 ENCOUNTER — Ambulatory Visit: Payer: Self-pay | Admitting: Family Medicine

## 2023-04-24 ENCOUNTER — Ambulatory Visit: Payer: Self-pay | Admitting: Family Medicine

## 2023-05-13 ENCOUNTER — Other Ambulatory Visit: Payer: Self-pay

## 2023-05-13 DIAGNOSIS — G894 Chronic pain syndrome: Secondary | ICD-10-CM

## 2023-05-13 DIAGNOSIS — F1190 Opioid use, unspecified, uncomplicated: Secondary | ICD-10-CM

## 2023-05-13 DIAGNOSIS — D571 Sickle-cell disease without crisis: Secondary | ICD-10-CM

## 2023-05-13 MED ORDER — OXYCODONE HCL 10 MG PO TABS: 10.0000 mg | ORAL_TABLET | ORAL | 0 refills | Status: DC | PRN

## 2023-05-13 MED ORDER — LOSARTAN POTASSIUM 50 MG PO TABS: 50.0000 mg | ORAL_TABLET | Freq: Every day | ORAL | 1 refills | Status: DC

## 2023-05-13 NOTE — Telephone Encounter (Signed)
From: [name] To: Office of Julianne Handler, [place] Sent: 05/13/2023 8:52 AM EDT Subject: Medication Renewal Request  Refills have been requested for the following medications:   Oxycodone HCl 10 MG TABS [Lachina Hollis]   losartan (COZAAR) 50 MG tablet  Preferred pharmacy: [id number] - [place], [place] - [address] Delivery method: [employer]

## 2023-05-13 NOTE — Telephone Encounter (Signed)
Please advise KH 

## 2023-06-12 ENCOUNTER — Telehealth: Payer: Self-pay | Admitting: Family Medicine

## 2023-06-12 NOTE — Telephone Encounter (Signed)
Please advise if pt can have a note for missing work. Thanks [employer]

## 2023-06-12 NOTE — Telephone Encounter (Signed)
Pt is requesting a note for excuse note for work. Pt stated he was in pain and had to call out.  Pt requested call back to inform when he cold pick the note up.

## 2023-08-05 ENCOUNTER — Encounter (HOSPITAL_BASED_OUTPATIENT_CLINIC_OR_DEPARTMENT_OTHER): Payer: Self-pay | Admitting: Emergency Medicine

## 2023-08-05 ENCOUNTER — Emergency Department (HOSPITAL_BASED_OUTPATIENT_CLINIC_OR_DEPARTMENT_OTHER): Payer: Medicaid Other

## 2023-08-05 ENCOUNTER — Emergency Department (HOSPITAL_BASED_OUTPATIENT_CLINIC_OR_DEPARTMENT_OTHER)
Admission: EM | Admit: 2023-08-05 | Discharge: 2023-08-05 | Disposition: A | Discharge status: Home / Self Care | Payer: Medicaid Other | Attending: Emergency Medicine | Admitting: Emergency Medicine

## 2023-08-05 ENCOUNTER — Other Ambulatory Visit: Payer: Self-pay

## 2023-08-05 DIAGNOSIS — W208XXA Other cause of strike by thrown, projected or falling object, initial encounter: Secondary | ICD-10-CM | POA: Insufficient documentation

## 2023-08-05 DIAGNOSIS — S9032XA Contusion of left foot, initial encounter: Secondary | ICD-10-CM | POA: Diagnosis not present

## 2023-08-05 DIAGNOSIS — S99922A Unspecified injury of left foot, initial encounter: Secondary | ICD-10-CM | POA: Diagnosis present

## 2023-08-05 NOTE — Discharge Instructions (Signed)
Thank you for allowing [place] to be a part of your care today.  You were evaluated in the ED for injury to your left foot.  Your x-ray was negative for any broken bones.  I recommend icing your foot 2-3 times per day for no longer than 20 minutes at a time to help with pain and swelling.  Always use a towel or cloth between your foot and the ice.  I also recommend taking 600-800 mg of ibuprofen every 6-8 hours as needed for pain and swelling.  Your foot will likely be sore and swollen over the next few days.  If it has worsens or does not begin to improve with conservative treatment, please follow-up with your primary care provider.  Return to the ED if you develop sudden worsening of your symptoms or if you have any new concerns.

## 2023-08-05 NOTE — ED Triage Notes (Signed)
Pt with pain to LT foot after a washing machine fell on it today

## 2023-08-05 NOTE — ED Provider Notes (Signed)
[facility] [place] Provider Note   CSN: [id number] Arrival date & time: 08/05/23  1843     History  Chief Complaint  Patient presents with   Foot Pain    [name] Hill is a 22 y.o. male with past medical history significant for sickle cell anemia presents to the ED complaining of pain to his left foot after a washing machine fell on it today.  He states he was helping someone move the washer slipped and dropped directly on the top of his left foot.  He has been able to walk, but does have increased pain.  He took Tylenol prior to ED arrival.  Denies weakness or numbness in the foot.       Home Medications Prior to Admission medications   Medication Sig Start Date End Date Taking? Authorizing Provider  albuterol (VENTOLIN HFA) 108 (90 Base) MCG/ACT inhaler Inhale 2 puffs into the lungs every 6 (six) hours as needed for wheezing or shortness of breath. 10/08/22   Massie Maroon, FNP  ibuprofen (ADVIL) 800 MG tablet Take 1 tablet (800 mg total) by mouth every 8 (eight) hours as needed. Patient not taking: Reported on 03/18/2023 12/17/22   Massie Maroon, FNP  losartan (COZAAR) 50 MG tablet Take 1 tablet (50 mg total) by mouth daily. 05/13/23   Massie Maroon, FNP  montelukast (SINGULAIR) 10 MG tablet Take 1 tablet (10 mg total) by mouth at bedtime. Patient not taking: Reported on 03/18/2023 10/08/22 10/08/23  Massie Maroon, FNP  Oxycodone HCl 10 MG TABS Take 1 tablet (10 mg total) by mouth every 4 (four) hours as needed (pain). 05/13/23   Massie Maroon, FNP  Vitamin D, Ergocalciferol, (DRISDOL) 1.25 MG (50000 UNIT) CAPS capsule Take 1 capsule (50,000 Units total) by mouth every 7 (seven) days. Patient not taking: Reported on 03/18/2023 10/25/22   Donell Beers, FNP      Allergies    Lisinopril, Pork-derived products, and Shellfish allergy    Review of Systems   Review of Systems  Musculoskeletal:  Positive for arthralgias  (left foot pain). Negative for gait problem and joint swelling.  Neurological:  Negative for weakness and numbness.    Physical Exam Updated Vital Signs BP 124/60 (BP Location: Right Arm)   Pulse 73   Temp 97.7 F (36.5 C)   Resp 18   Ht 5\' 7"  (1.702 m)   Wt 88.5 kg   SpO2 94%   BMI 30.54 kg/m  Physical Exam Vitals and nursing note reviewed.  Constitutional:      General: He is not in acute distress.    Appearance: Normal appearance. He is not ill-appearing or diaphoretic.  Cardiovascular:     Rate and Rhythm: Normal rate and regular rhythm.  Pulmonary:     Effort: Pulmonary effort is normal.  Musculoskeletal:     Left foot: Normal range of motion and normal capillary refill. Swelling (to dorsal surface) and tenderness (dorsal) present. No deformity, laceration, bony tenderness or crepitus. Normal pulse.  Skin:    General: Skin is warm and dry.     Capillary Refill: Capillary refill takes less than 2 seconds.  Neurological:     Mental Status: He is alert. Mental status is at baseline.  Psychiatric:        Mood and Affect: Mood normal.        Behavior: Behavior normal.     ED Results / Procedures / Treatments   Labs (  all labs ordered are listed, but only abnormal results are displayed) Labs Reviewed - No data to display  EKG None  Radiology DG Foot Complete Left  Result Date: 08/05/2023 CLINICAL DATA:  Pain after trauma.  Washing machine fell onto foot. EXAM: LEFT FOOT - COMPLETE 3 VIEW COMPARISON:  None Available. FINDINGS: There is no evidence of fracture or dislocation. There is no evidence of arthropathy or other focal bone abnormality. Soft tissues are unremarkable. IMPRESSION: No acute osseous abnormality. Electronically Signed   By: Karen Kays M.D.   On: 08/05/2023 19:55    Procedures Procedures    Medications Ordered in ED Medications - No data to display  ED Course/ Medical Decision Making/ A&P                                 Medical Decision  Making Amount and/or Complexity of Data Reviewed Radiology: ordered.   This patient presents to the ED with chief complaint(s) of left foot injury with pertinent past medical history of sickle cell anemia.  The complaint involves an extensive differential diagnosis and also carries with it a high risk of complications and morbidity.    The differential diagnosis includes acute fracture, contusion   Initial Assessment:   Exam significant for swelling to the dorsal surface of the left foot.  There is a small area, approximately 3 mm in width, of ecchymosis.  Normal ROM of ankle and toes.  DP pulse is 2+ and regular.  Foot is neurovascularly intact.  Independent interpretation of imaging: Left foot x-ray was obtained and does not demonstrate any acute fracture.  I agree with radiologist interpretation.  Disposition:   Discussed supportive care measures for foot injury including ice and ibuprofen.  Patient's x-ray was negative for fracture.  Recommended PCP follow-up if symptoms do not improve despite conservative treatment.  The patient has been appropriately medically screened and/or stabilized in the ED. I have low suspicion for any other emergent medical condition which would require further screening, evaluation or treatment in the ED or require inpatient management. At time of discharge the patient is hemodynamically stable and in no acute distress. I have discussed work-up results and diagnosis with patient and answered all questions. Patient is agreeable with discharge plan. We discussed strict return precautions for returning to the emergency department and they verbalized understanding.            Final Clinical Impression(s) / ED Diagnoses Final diagnoses:  None    Rx / DC Orders ED Discharge Orders     None         Lenard Simmer, PA-C 08/05/23 2033    Maia Plan, MD 08/07/23 1225

## 2023-08-23 ENCOUNTER — Other Ambulatory Visit: Payer: Self-pay

## 2023-08-23 ENCOUNTER — Emergency Department (HOSPITAL_BASED_OUTPATIENT_CLINIC_OR_DEPARTMENT_OTHER)
Admission: EM | Admit: 2023-08-23 | Discharge: 2023-08-23 | Disposition: A | Discharge status: Home / Self Care | Payer: Medicaid Other | Attending: Emergency Medicine | Admitting: Emergency Medicine

## 2023-08-23 ENCOUNTER — Emergency Department (HOSPITAL_BASED_OUTPATIENT_CLINIC_OR_DEPARTMENT_OTHER): Payer: Medicaid Other

## 2023-08-23 ENCOUNTER — Encounter (HOSPITAL_BASED_OUTPATIENT_CLINIC_OR_DEPARTMENT_OTHER): Payer: Self-pay

## 2023-08-23 DIAGNOSIS — I10 Essential (primary) hypertension: Secondary | ICD-10-CM | POA: Insufficient documentation

## 2023-08-23 DIAGNOSIS — D5700 Hb-SS disease with crisis, unspecified: Secondary | ICD-10-CM | POA: Insufficient documentation

## 2023-08-23 DIAGNOSIS — R072 Precordial pain: Secondary | ICD-10-CM | POA: Diagnosis present

## 2023-08-23 LAB — CBC WITH DIFFERENTIAL/PLATELET
Abs Immature Granulocytes: 0 10*3/uL (ref 0.00–0.07)
Basophils Absolute: 0 10*3/uL (ref 0.0–0.1)
Basophils Relative: 0 %
Eosinophils Absolute: 0.6 10*3/uL — ABNORMAL HIGH (ref 0.0–0.5)
Eosinophils Relative: 5 %
HCT: 22.4 % — ABNORMAL LOW (ref 39.0–52.0)
Hemoglobin: 8.1 g/dL — ABNORMAL LOW (ref 13.0–17.0)
Lymphocytes Relative: 56 %
Lymphs Abs: 7 10*3/uL — ABNORMAL HIGH (ref 0.7–4.0)
MCH: 29.1 pg (ref 26.0–34.0)
MCHC: 36.2 g/dL — ABNORMAL HIGH (ref 30.0–36.0)
MCV: 80.6 fL (ref 80.0–100.0)
Monocytes Absolute: 0.3 10*3/uL (ref 0.1–1.0)
Monocytes Relative: 2 %
Neutro Abs: 4.6 10*3/uL (ref 1.7–7.7)
Neutrophils Relative %: 37 %
Platelets: 391 10*3/uL (ref 150–400)
RBC: 2.78 MIL/uL — ABNORMAL LOW (ref 4.22–5.81)
RDW: 22.6 % — ABNORMAL HIGH (ref 11.5–15.5)
WBC: 12.5 10*3/uL — ABNORMAL HIGH (ref 4.0–10.5)
nRBC: 0.7 % — ABNORMAL HIGH (ref 0.0–0.2)

## 2023-08-23 LAB — COMPREHENSIVE METABOLIC PANEL
ALT: 21 U/L (ref 0–44)
AST: 41 U/L (ref 15–41)
Albumin: 4.1 g/dL (ref 3.5–5.0)
Alkaline Phosphatase: 56 U/L (ref 38–126)
Anion gap: 10 (ref 5–15)
BUN: 13 mg/dL (ref 6–20)
CO2: 22 mmol/L (ref 22–32)
Calcium: 8.7 mg/dL — ABNORMAL LOW (ref 8.9–10.3)
Chloride: 105 mmol/L (ref 98–111)
Creatinine, Ser: 0.67 mg/dL (ref 0.61–1.24)
GFR, Estimated: >60 mL/min (ref >60)
Glucose, Bld: 95 mg/dL (ref 70–99)
Potassium: 4.1 mmol/L (ref 3.5–5.1)
Sodium: 137 mmol/L (ref 135–145)
Total Bilirubin: 3.6 mg/dL — ABNORMAL HIGH (ref 0.3–1.2)
Total Protein: 6.7 g/dL (ref 6.5–8.1)

## 2023-08-23 LAB — TROPONIN I (HIGH SENSITIVITY): Troponin I (High Sensitivity): 5 ng/L (ref <18)

## 2023-08-23 LAB — RETICULOCYTES
Immature Retic Fract: 37.7 % — ABNORMAL HIGH (ref 2.3–15.9)
RBC.: 2.82 MIL/uL — ABNORMAL LOW (ref 4.22–5.81)
Retic Count, Absolute: 166.4 10*3/uL (ref 19.0–186.0)
Retic Ct Pct: 5.9 % — ABNORMAL HIGH (ref 0.4–3.1)

## 2023-08-23 MED ORDER — HYDROMORPHONE HCL 1 MG/ML IJ SOLN: 1.0000 mg | INTRAMUSCULAR | Status: AC

## 2023-08-23 MED ORDER — HYDROMORPHONE HCL 1 MG/ML IJ SOLN: 1.0000 mg | Freq: Once | INTRAMUSCULAR | Status: DC

## 2023-08-23 MED ORDER — ONDANSETRON HCL 4 MG/2ML IJ SOLN
4.0000 mg | INTRAMUSCULAR | Status: DC | PRN
  Administered 2023-08-23: 4 mg via INTRAVENOUS
  Filled 2023-08-23: qty 2

## 2023-08-23 MED ORDER — HYDROMORPHONE HCL 1 MG/ML IJ SOLN
1.0000 mg | INTRAMUSCULAR | Status: AC
  Filled 2023-08-23: qty 1

## 2023-08-23 MED ORDER — SODIUM CHLORIDE 0.45 % IV SOLN: INTRAVENOUS | Status: DC

## 2023-08-23 MED ORDER — DIPHENHYDRAMINE HCL 25 MG PO CAPS: 25.0000 mg | ORAL_CAPSULE | ORAL | Status: DC | PRN

## 2023-08-23 MED ORDER — KETOROLAC TROMETHAMINE 15 MG/ML IJ SOLN
15.0000 mg | INTRAMUSCULAR | Status: AC
  Administered 2023-08-23: 15 mg via INTRAVENOUS
  Filled 2023-08-23: qty 1

## 2023-08-23 MED ORDER — OXYCODONE HCL 5 MG PO TABS
10.0000 mg | ORAL_TABLET | Freq: Once | ORAL | Status: AC
  Administered 2023-08-23: 10 mg via ORAL
  Filled 2023-08-23: qty 2

## 2023-08-23 MED ORDER — HYDROMORPHONE HCL 1 MG/ML IJ SOLN
1.0000 mg | INTRAMUSCULAR | Status: AC
  Administered 2023-08-23: 1 mg via INTRAVENOUS
  Filled 2023-08-23: qty 1
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
## 2023-08-23 NOTE — ED Triage Notes (Signed)
The patient sated he has sickle cell and feels like he is having a crisis. He is having chest and back pain that started today.

## 2023-08-23 NOTE — ED Notes (Signed)
Pt. Is feeling better at this time.  No complaints to RN when asked.

## 2023-08-23 NOTE — Discharge Instructions (Addendum)

## 2023-08-23 NOTE — ED Provider Notes (Signed)
  Physical Exam  BP (!) 128/50   Pulse 84   Temp 98.1 F (36.7 C) (Oral)   Resp 20   Ht 5\' 7"  (1.702 m)   Wt 88 kg   SpO2 92%   BMI 30.39 kg/m   Physical Exam  Procedures  Procedures  ED Course / MDM    Medical Decision Making Care assumed at 3 pm. Patient has hx of sickle cell here with chest pain. CXR showed no acute chest, labs at baseline. Sign out pending reassessment after pain meds   3:49 PM I reassessed and he felt well. Stable for discharge. He will follow up with his sickle cell clinic outpatient   Amount and/or Complexity of Data Reviewed Labs: ordered. Decision-making details documented in ED Course. Radiology: ordered and independent interpretation performed. Decision-making details documented in ED Course.  Risk Prescription drug management.          Charlynne Pander, MD 08/23/23 [phone]

## 2023-08-23 NOTE — ED Notes (Signed)
Pt. Reports his back and his chest and R leg are hurting today.

## 2023-08-28 ENCOUNTER — Other Ambulatory Visit: Payer: Self-pay | Admitting: Family Medicine

## 2023-08-28 DIAGNOSIS — D571 Sickle-cell disease without crisis: Secondary | ICD-10-CM

## 2023-08-28 DIAGNOSIS — G894 Chronic pain syndrome: Secondary | ICD-10-CM

## 2023-08-28 DIAGNOSIS — F1190 Opioid use, unspecified, uncomplicated: Secondary | ICD-10-CM

## 2023-08-28 MED ORDER — OXYCODONE HCL 10 MG PO TABS: 10.0000 mg | ORAL_TABLET | ORAL | 0 refills | Status: DC | PRN

## 2023-08-28 NOTE — Telephone Encounter (Signed)
Reviewed PDMP substance reporting system prior to prescribing opiate medications. No inconsistencies noted.   Meds ordered this encounter  Medications  . Oxycodone HCl 10 MG TABS    Sig: Take 1 tablet (10 mg total) by mouth every 4 (four) hours as needed (pain).    Dispense:  60 tablet    Refill:  0    Order Specific Question:   Supervising Provider    Answer:   Tresa Garter [id number]     Donia Pounds  APRN, MSN, FNP-C Patient [place] [address] [place], [place] [address] [phone]

## 2023-09-04 NOTE — Telephone Encounter (Signed)
Pt called and said that for him to go back to [facility] he would like for you to send a referral to them!

## 2023-09-11 ENCOUNTER — Encounter: Payer: Self-pay | Admitting: Nurse Practitioner

## 2023-09-11 ENCOUNTER — Ambulatory Visit (INDEPENDENT_AMBULATORY_CARE_PROVIDER_SITE_OTHER): Payer: Medicaid Other | Admitting: Nurse Practitioner

## 2023-09-11 VITALS — BP 117/63 | HR 78 | Temp 98.8°F | Ht 67.0 in | Wt 187.0 lb

## 2023-09-11 DIAGNOSIS — R0602 Shortness of breath: Secondary | ICD-10-CM | POA: Diagnosis not present

## 2023-09-11 DIAGNOSIS — R300 Dysuria: Secondary | ICD-10-CM

## 2023-09-11 DIAGNOSIS — N4889 Other specified disorders of penis: Secondary | ICD-10-CM

## 2023-09-11 MED ORDER — MONTELUKAST SODIUM 10 MG PO TABS: 10.0000 mg | ORAL_TABLET | Freq: Every day | ORAL | 5 refills | Status: AC
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
## 2023-09-11 NOTE — Patient Instructions (Addendum)
1. Shortness of breath  - D-dimer, quantitative - if elevated will need to report to the ED - CBC - Comprehensive metabolic panel    Follow up:  Follow up with PCP

## 2023-09-12 ENCOUNTER — Emergency Department (HOSPITAL_COMMUNITY)
Admission: EM | Admit: 2023-09-12 | Discharge: 2023-09-12 | Disposition: A | Discharge status: Home / Self Care | Payer: Medicaid Other

## 2023-09-12 ENCOUNTER — Emergency Department (HOSPITAL_COMMUNITY): Payer: Medicaid Other

## 2023-09-12 ENCOUNTER — Encounter (HOSPITAL_COMMUNITY): Payer: Self-pay

## 2023-09-12 ENCOUNTER — Other Ambulatory Visit: Payer: Self-pay

## 2023-09-12 DIAGNOSIS — R0602 Shortness of breath: Secondary | ICD-10-CM | POA: Insufficient documentation

## 2023-09-12 LAB — CBC
Hematocrit: 27.4 % — ABNORMAL LOW (ref 37.5–51.0)
Hemoglobin: 9.4 g/dL — ABNORMAL LOW (ref 13.0–17.7)
MCH: 29.3 pg (ref 26.6–33.0)
MCHC: 34.3 g/dL (ref 31.5–35.7)
MCV: 85 fL (ref 79–97)
NRBC: 1 % — ABNORMAL HIGH (ref 0–0)
Platelets: 371 10*3/uL (ref 150–450)
RBC: 3.21 x10E6/uL — ABNORMAL LOW (ref 4.14–5.80)
RDW: 21.9 % — ABNORMAL HIGH (ref 11.6–15.4)
WBC: 10.8 10*3/uL (ref 3.4–10.8)

## 2023-09-12 LAB — COMPREHENSIVE METABOLIC PANEL
ALT: 18 [IU]/L (ref 0–44)
AST: 44 [IU]/L — ABNORMAL HIGH (ref 0–40)
Albumin: 4.6 g/dL (ref 4.3–5.2)
Alkaline Phosphatase: 72 [IU]/L (ref 44–121)
BUN/Creatinine Ratio: 12 (ref 9–20)
BUN: 10 mg/dL (ref 6–20)
Bilirubin Total: 4.1 mg/dL — ABNORMAL HIGH (ref 0.0–1.2)
CO2: 20 mmol/L (ref 20–29)
Calcium: 9.6 mg/dL (ref 8.7–10.2)
Chloride: 103 mmol/L (ref 96–106)
Creatinine, Ser: 0.81 mg/dL (ref 0.76–1.27)
Globulin, Total: 2.3 g/dL (ref 1.5–4.5)
Glucose: 86 mg/dL (ref 70–99)
Potassium: 4.7 mmol/L (ref 3.5–5.2)
Sodium: 139 mmol/L (ref 134–144)
Total Protein: 6.9 g/dL (ref 6.0–8.5)
eGFR: 126 mL/min/{1.73_m2} (ref >59)

## 2023-09-12 LAB — D-DIMER, QUANTITATIVE: D-DIMER: 3.67 mg{FEU}/L — ABNORMAL HIGH (ref 0.00–0.49)

## 2023-09-12 MED ORDER — IOHEXOL 350 MG/ML SOLN
80.0000 mL | Freq: Once | INTRAVENOUS | Status: AC | PRN
  Administered 2023-09-12: 80 mL via INTRAVENOUS

## 2023-09-12 NOTE — ED Triage Notes (Signed)
PT referred to the ER by his PCP for an elevated D-dimer. Pt states he felt a "flutter in my breathing" 3x times while sleeping and decided to go get checked by his PCP. Denies any pain or SOB at this time.

## 2023-09-12 NOTE — ED Provider Notes (Signed)
EMERGENCY DEPARTMENT AT [facility] Provider Note   CSN: [id number] Arrival date & time: 09/12/23  1255     History Chief Complaint  Patient presents with   Abnormal Lab    [name] Hill is a 22 y.o. male.  Patient presents to the emergency department concerns of elevated D-dimer level.  Patient has a past history significant for sickle cell disease, acute chest syndrome and had labs checked yesterday by primary care provider which revealed an elevated D-dimer level.  Other labs are at baseline.  Given the patient did experience symptoms of chest fluttering and shortness of breath 2 days ago, was advised to come to the emergency department for evaluation to rule out pulmonary embolism.  No prior history of clot and not currently on blood thinners.  Denies any recent surgery, hemoptysis, or prolonged immobilization.  No leg swelling noted by patient.   Abnormal Lab      Home Medications Prior to Admission medications   Medication Sig Start Date End Date Taking? Authorizing Provider  albuterol (VENTOLIN HFA) 108 (90 Base) MCG/ACT inhaler Inhale 2 puffs into the lungs every 6 (six) hours as needed for wheezing or shortness of breath. 10/08/22   Massie Maroon, FNP  ibuprofen (ADVIL) 800 MG tablet Take 1 tablet (800 mg total) by mouth every 8 (eight) hours as needed. 12/17/22   Massie Maroon, FNP  losartan (COZAAR) 50 MG tablet Take 1 tablet (50 mg total) by mouth daily. 05/13/23   Massie Maroon, FNP  montelukast (SINGULAIR) 10 MG tablet Take 1 tablet (10 mg total) by mouth at bedtime. 09/11/23 09/10/24  Ivonne Andrew, NP  Oxycodone HCl 10 MG TABS Take 1 tablet (10 mg total) by mouth every 4 (four) hours as needed (pain). 08/28/23   Massie Maroon, FNP  Vitamin D, Ergocalciferol, (DRISDOL) 1.25 MG (50000 UNIT) CAPS capsule Take 1 capsule (50,000 Units total) by mouth every 7 (seven) days. 10/25/22   Donell Beers, FNP      Allergies     Lisinopril, Pork-derived products, and Shellfish allergy    Review of Systems   Review of Systems  Respiratory:  Positive for shortness of breath.   All other systems reviewed and are negative.   Physical Exam Updated Vital Signs BP (!) 123/52 (BP Location: Left Arm)   Pulse 72   Temp 98.2 F (36.8 C) (Oral)   Resp 16   Ht 5\' 7"  (1.702 m)   Wt 86.2 kg   SpO2 96%   BMI 29.76 kg/m  Physical Exam Vitals and nursing note reviewed.  Constitutional:      General: He is not in acute distress.    Appearance: He is well-developed.  HENT:     Head: Normocephalic and atraumatic.  Eyes:     Conjunctiva/sclera: Conjunctivae normal.  Cardiovascular:     Rate and Rhythm: Normal rate and regular rhythm.     Heart sounds: No murmur heard. Pulmonary:     Effort: Pulmonary effort is normal. No respiratory distress.     Breath sounds: Normal breath sounds. No stridor. No wheezing or rales.  Abdominal:     Palpations: Abdomen is soft.     Tenderness: There is no abdominal tenderness.  Musculoskeletal:        General: No swelling.     Cervical back: Neck supple.  Skin:    General: Skin is warm and dry.     Capillary Refill: Capillary refill takes less than  2 seconds.  Neurological:     Mental Status: He is alert.  Psychiatric:        Mood and Affect: Mood normal.     ED Results / Procedures / Treatments   Labs (all labs ordered are listed, but only abnormal results are displayed) Labs Reviewed - No data to display  EKG None  Radiology CT Angio Chest PE W and/or Wo Contrast  Result Date: 09/12/2023 CLINICAL DATA:  Positive D-dimer and chest pain, history of sickle cell, initial encounter EXAM: CT ANGIOGRAPHY CHEST WITH CONTRAST TECHNIQUE: Multidetector CT imaging of the chest was performed using the standard protocol during bolus administration of intravenous contrast. Multiplanar CT image reconstructions and MIPs were obtained to evaluate the vascular anatomy. RADIATION  DOSE REDUCTION: This exam was performed according to the departmental dose-optimization program which includes automated exposure control, adjustment of the mA and/or kV according to patient size and/or use of iterative reconstruction technique. CONTRAST:  80mL OMNIPAQUE IOHEXOL 350 MG/ML SOLN COMPARISON:  Chest x-ray from 08/23/2023 FINDINGS: Cardiovascular: Thoracic aorta is well visualized and within normal limits. Heart is at the upper limits of normal in size. The pulmonary artery is well visualized within normal branching pattern. No intraluminal filling defect to suggest pulmonary embolism is noted. Mediastinum/Nodes: Thoracic inlet is within normal limits. No hilar or mediastinal adenopathy is noted. The esophagus as visualized is within normal limits. Lungs/Pleura: Lungs are well aerated bilaterally. Minimal dependent atelectatic changes are seen. No focal pulmonary nodule or infiltrate is noted. Upper Abdomen: Visualized upper abdomen is within normal limits. Musculoskeletal: Bony structures demonstrate some generalized increased sclerosis consistent with the underlying given clinical history of sickle cell disease. Endplate concavity is again noted Review of the MIP images confirms the above findings. IMPRESSION: No evidence of pulmonary emboli. Changes consistent with the given clinical history of sickle cell disease. No other focal abnormality is noted. Electronically Signed   By: Alcide Clever M.D.   On: 09/12/2023 17:39    Procedures Procedures   Medications Ordered in ED Medications  iohexol (OMNIPAQUE) 350 MG/ML injection 80 mL (80 mLs Intravenous Contrast Given 09/12/23 1630)    ED Course/ Medical Decision Making/ A&P                               Medical Decision Making Amount and/or Complexity of Data Reviewed Radiology: ordered.  Risk Prescription drug management.   This patient presents to the ED for concern of shortness of breath.  Differential diagnosis includes  bronchitis, pneumonia, pulmonary embolism, viral URI   Lab Tests:  I Ordered, and personally interpreted labs.  The pertinent results include: Labs checked yesterday include CBC which shows anemia with hemoglobin of 9.4 which is slightly improved compared to baseline, CMP unremarkable, D-dimer elevated at 3.67   Imaging Studies ordered:  I ordered imaging studies including CT angio chest PE I independently visualized and interpreted imaging which showed no evidence of bladder embolism bladder, cardiopulmonary abnormality I agree with the radiologist interpretation   Problem List / ED Course:  Patient past history significant for sickle cell disease presents the emergency department concerns of shortness of breath and elevated D-dimer level.  Patient had labs checked by primary care provider yesterday which revealed elevated D-dimer level and baseline anemia slightly improved.  States that he noted shortness of breath about 2 days ago he was working an overnight shift.  States this is somewhat improved but still feels that  he is having some difficulty with deep inhalation.  Denies any recent fevers, body aches or chills.  Denies any pain anywhere consistent with sickle cell pain.  Given access to lab check checked today with D-dimer showing elevation at 3.67, will proceed with CT imaging to rule out PE.  Suspect that the D-dimer level is likely elevated due to sickle cell anemia.  Patient agreeable current plan.  Will reassess patient periodically to ensure no acute decline or worsening of status. CT angio chest negative for PE.  No evidence of pneumonia either.  Unsure exact mechanism of patient's symptoms but given recent labs performed yesterday showing no significant changes and no evidence of leukocytosis, no acute concerns at this time requiring further evaluation or workup.  Advised patient to follow-up with primary care provider or return to the emergency department if symptoms persist.   Patient discharged home in stable condition  Final Clinical Impression(s) / ED Diagnoses Final diagnoses:  Shortness of breath    Rx / DC Orders ED Discharge Orders     None         Salomon Mast 09/12/23 1839    Durwin Glaze, MD 09/12/23 2315

## 2023-09-12 NOTE — Discharge Instructions (Signed)
You were seen in the ER today for an abnormal lab. Your CT chest scan was negative for signs of pulmonary embolism or other abnormality like bronchitis or pneumonia. I am not sure what is causing your symptoms, but does not appear to be acutely concerning at this time given your labs checked yesterday and your reassuring CT scan. If symptoms worsen, please return to the ER.

## 2023-09-26 ENCOUNTER — Emergency Department (HOSPITAL_COMMUNITY): Payer: Medicaid Other

## 2023-09-26 ENCOUNTER — Encounter (HOSPITAL_COMMUNITY): Payer: Self-pay

## 2023-09-26 ENCOUNTER — Other Ambulatory Visit: Payer: Self-pay

## 2023-09-26 ENCOUNTER — Inpatient Hospital Stay (HOSPITAL_COMMUNITY)
Admission: EM | Admit: 2023-09-26 | Discharge: 2023-10-01 | DRG: 812 | Disposition: A | Discharge status: Home / Self Care | Payer: Medicaid Other | Attending: Internal Medicine | Admitting: Internal Medicine

## 2023-09-26 DIAGNOSIS — Z83438 Family history of other disorder of lipoprotein metabolism and other lipidemia: Secondary | ICD-10-CM | POA: Diagnosis not present

## 2023-09-26 DIAGNOSIS — Z832 Family history of diseases of the blood and blood-forming organs and certain disorders involving the immune mechanism: Secondary | ICD-10-CM

## 2023-09-26 DIAGNOSIS — Z79899 Other long term (current) drug therapy: Secondary | ICD-10-CM

## 2023-09-26 DIAGNOSIS — G894 Chronic pain syndrome: Secondary | ICD-10-CM | POA: Diagnosis present

## 2023-09-26 DIAGNOSIS — D5700 Hb-SS disease with crisis, unspecified: Principal | ICD-10-CM | POA: Diagnosis present

## 2023-09-26 DIAGNOSIS — D72829 Elevated white blood cell count, unspecified: Secondary | ICD-10-CM | POA: Diagnosis present

## 2023-09-26 DIAGNOSIS — D638 Anemia in other chronic diseases classified elsewhere: Secondary | ICD-10-CM | POA: Diagnosis present

## 2023-09-26 DIAGNOSIS — Z91018 Allergy to other foods: Secondary | ICD-10-CM | POA: Diagnosis not present

## 2023-09-26 DIAGNOSIS — Z888 Allergy status to other drugs, medicaments and biological substances status: Secondary | ICD-10-CM | POA: Diagnosis not present

## 2023-09-26 DIAGNOSIS — I10 Essential (primary) hypertension: Secondary | ICD-10-CM | POA: Diagnosis present

## 2023-09-26 DIAGNOSIS — J4520 Mild intermittent asthma, uncomplicated: Secondary | ICD-10-CM | POA: Diagnosis present

## 2023-09-26 DIAGNOSIS — N08 Glomerular disorders in diseases classified elsewhere: Secondary | ICD-10-CM | POA: Diagnosis present

## 2023-09-26 DIAGNOSIS — Z8249 Family history of ischemic heart disease and other diseases of the circulatory system: Secondary | ICD-10-CM

## 2023-09-26 DIAGNOSIS — Z8709 Personal history of other diseases of the respiratory system: Secondary | ICD-10-CM

## 2023-09-26 DIAGNOSIS — Z91013 Allergy to seafood: Secondary | ICD-10-CM | POA: Diagnosis not present

## 2023-09-26 LAB — CBC WITH DIFFERENTIAL/PLATELET
Abs Immature Granulocytes: 0.35 10*3/uL — ABNORMAL HIGH (ref 0.00–0.07)
Basophils Absolute: 0.2 10*3/uL — ABNORMAL HIGH (ref 0.0–0.1)
Basophils Relative: 1 %
Eosinophils Absolute: 0.5 10*3/uL (ref 0.0–0.5)
Eosinophils Relative: 3 %
HCT: 25.8 % — ABNORMAL LOW (ref 39.0–52.0)
Hemoglobin: 9.1 g/dL — ABNORMAL LOW (ref 13.0–17.0)
Immature Granulocytes: 2 %
Lymphocytes Relative: 42 %
Lymphs Abs: 6.9 10*3/uL — ABNORMAL HIGH (ref 0.7–4.0)
MCH: 29.5 pg (ref 26.0–34.0)
MCHC: 35.3 g/dL (ref 30.0–36.0)
MCV: 83.8 fL (ref 80.0–100.0)
Monocytes Absolute: 1.6 10*3/uL — ABNORMAL HIGH (ref 0.1–1.0)
Monocytes Relative: 10 %
Neutro Abs: 6.9 10*3/uL (ref 1.7–7.7)
Neutrophils Relative %: 42 %
Platelets: 387 10*3/uL (ref 150–400)
RBC: 3.08 MIL/uL — ABNORMAL LOW (ref 4.22–5.81)
RDW: 21 % — ABNORMAL HIGH (ref 11.5–15.5)
WBC: 16.3 10*3/uL — ABNORMAL HIGH (ref 4.0–10.5)
nRBC: 0.8 % — ABNORMAL HIGH (ref 0.0–0.2)

## 2023-09-26 LAB — TROPONIN I (HIGH SENSITIVITY)
Troponin I (High Sensitivity): 5 ng/L (ref <18)
Troponin I (High Sensitivity): 5 ng/L (ref <18)

## 2023-09-26 LAB — COMPREHENSIVE METABOLIC PANEL
ALT: 34 U/L (ref 0–44)
AST: 65 U/L — ABNORMAL HIGH (ref 15–41)
Albumin: 4.5 g/dL (ref 3.5–5.0)
Alkaline Phosphatase: 68 U/L (ref 38–126)
Anion gap: 10 (ref 5–15)
BUN: 12 mg/dL (ref 6–20)
CO2: 26 mmol/L (ref 22–32)
Calcium: 9.3 mg/dL (ref 8.9–10.3)
Chloride: 103 mmol/L (ref 98–111)
Creatinine, Ser: 0.48 mg/dL — ABNORMAL LOW (ref 0.61–1.24)
GFR, Estimated: >60 mL/min (ref >60)
Glucose, Bld: 97 mg/dL (ref 70–99)
Potassium: 4.9 mmol/L (ref 3.5–5.1)
Sodium: 139 mmol/L (ref 135–145)
Total Bilirubin: 3.7 mg/dL — ABNORMAL HIGH (ref 0.3–1.2)
Total Protein: 7.3 g/dL (ref 6.5–8.1)

## 2023-09-26 LAB — RETICULOCYTES
Immature Retic Fract: 35 % — ABNORMAL HIGH (ref 2.3–15.9)
RBC.: 3.05 MIL/uL — ABNORMAL LOW (ref 4.22–5.81)
Retic Count, Absolute: 208.9 10*3/uL — ABNORMAL HIGH (ref 19.0–186.0)
Retic Ct Pct: 6.9 % — ABNORMAL HIGH (ref 0.4–3.1)

## 2023-09-26 LAB — HIV ANTIBODY (ROUTINE TESTING W REFLEX): HIV Screen 4th Generation wRfx: NONREACTIVE

## 2023-09-26 MED ORDER — OXYCODONE HCL 5 MG PO TABS
10.0000 mg | ORAL_TABLET | ORAL | Status: DC | PRN
  Administered 2023-09-26 – 2023-09-29 (×4): 10 mg via ORAL
  Filled 2023-09-26 (×4): qty 2

## 2023-09-26 MED ORDER — ALBUTEROL SULFATE (2.5 MG/3ML) 0.083% IN NEBU: 2.5000 mg | INHALATION_SOLUTION | Freq: Four times a day (QID) | RESPIRATORY_TRACT | Status: DC | PRN

## 2023-09-26 MED ORDER — ONDANSETRON HCL 4 MG/2ML IJ SOLN: 4.0000 mg | INTRAMUSCULAR | Status: DC | PRN

## 2023-09-26 MED ORDER — HYDROMORPHONE HCL 1 MG/ML IJ SOLN
1.0000 mg | INTRAMUSCULAR | Status: AC
  Administered 2023-09-26: 1 mg via INTRAVENOUS
  Filled 2023-09-26: qty 1

## 2023-09-26 MED ORDER — NALOXONE HCL 0.4 MG/ML IJ SOLN: 0.4000 mg | INTRAMUSCULAR | Status: DC | PRN

## 2023-09-26 MED ORDER — DIPHENHYDRAMINE HCL 25 MG PO CAPS
25.0000 mg | ORAL_CAPSULE | ORAL | Status: DC | PRN
  Administered 2023-09-30: 25 mg via ORAL
  Filled 2023-09-26: qty 1

## 2023-09-26 MED ORDER — ORAL CARE MOUTH RINSE: 15.0000 mL | OROMUCOSAL | Status: DC | PRN

## 2023-09-26 MED ORDER — POLYETHYLENE GLYCOL 3350 17 G PO PACK: 17.0000 g | PACK | Freq: Every day | ORAL | Status: DC | PRN

## 2023-09-26 MED ORDER — ALBUTEROL SULFATE HFA 108 (90 BASE) MCG/ACT IN AERS: 2.0000 | INHALATION_SPRAY | Freq: Four times a day (QID) | RESPIRATORY_TRACT | Status: DC | PRN

## 2023-09-26 MED ORDER — HYDROMORPHONE HCL 2 MG/ML IJ SOLN
2.0000 mg | Freq: Once | INTRAMUSCULAR | Status: AC
  Administered 2023-09-26: 2 mg via INTRAVENOUS

## 2023-09-26 MED ORDER — DEXTROSE-SODIUM CHLORIDE 5-0.45 % IV SOLN: INTRAVENOUS | Status: AC

## 2023-09-26 MED ORDER — ONDANSETRON HCL 4 MG/2ML IJ SOLN: 4.0000 mg | Freq: Four times a day (QID) | INTRAMUSCULAR | Status: DC | PRN

## 2023-09-26 MED ORDER — SENNOSIDES-DOCUSATE SODIUM 8.6-50 MG PO TABS
1.0000 | ORAL_TABLET | Freq: Two times a day (BID) | ORAL | Status: DC
  Administered 2023-09-26 – 2023-10-01 (×10): 1 via ORAL
  Filled 2023-09-26 (×11): qty 1

## 2023-09-26 MED ORDER — KETOROLAC TROMETHAMINE 15 MG/ML IJ SOLN
15.0000 mg | Freq: Once | INTRAMUSCULAR | Status: AC
  Administered 2023-09-26: 15 mg via INTRAVENOUS
  Filled 2023-09-26: qty 1

## 2023-09-26 MED ORDER — HYDROMORPHONE HCL 1 MG/ML IJ SOLN
1.0000 mg | Freq: Once | INTRAMUSCULAR | Status: DC
  Filled 2023-09-26: qty 1

## 2023-09-26 MED ORDER — HYDROMORPHONE HCL 1 MG/ML IJ SOLN
1.0000 mg | Freq: Once | INTRAMUSCULAR | Status: AC
  Administered 2023-09-26: 1 mg via INTRAVENOUS

## 2023-09-26 MED ORDER — SODIUM CHLORIDE 0.9% FLUSH: 9.0000 mL | INTRAVENOUS | Status: DC | PRN

## 2023-09-26 MED ORDER — DIPHENHYDRAMINE HCL 25 MG PO CAPS: 25.0000 mg | ORAL_CAPSULE | ORAL | Status: DC | PRN

## 2023-09-26 MED ORDER — HYDROMORPHONE 1 MG/ML IV SOLN
INTRAVENOUS | Status: DC
  Administered 2023-09-26: 30 mg via INTRAVENOUS
  Administered 2023-09-26 – 2023-09-27 (×2): 5 mg via INTRAVENOUS
  Administered 2023-09-27: 3 mg via INTRAVENOUS
  Administered 2023-09-27: 4 mg via INTRAVENOUS
  Administered 2023-09-27: 2 mg via INTRAVENOUS
  Administered 2023-09-27: 3.5 mg via INTRAVENOUS
  Administered 2023-09-27: 0.5 mg via INTRAVENOUS
  Administered 2023-09-28: 1 mg via INTRAVENOUS
  Administered 2023-09-28: 4.5 mg via INTRAVENOUS
  Administered 2023-09-28: 3 mg via INTRAVENOUS
  Administered 2023-09-28: 30 mg via INTRAVENOUS
  Administered 2023-09-28: 3 mg via INTRAVENOUS
  Administered 2023-09-28: 2.5 mg via INTRAVENOUS
  Administered 2023-09-28: 1 mg via INTRAVENOUS
  Administered 2023-09-29: 1.5 mg via INTRAVENOUS
  Administered 2023-09-29: 5 mg via INTRAVENOUS
  Administered 2023-09-29: 2.5 mg via INTRAVENOUS
  Administered 2023-09-29: 1 mg via INTRAVENOUS
  Administered 2023-09-29: 0.5 mg via INTRAVENOUS
  Administered 2023-09-29: 1 mg via INTRAVENOUS
  Administered 2023-09-30: 0.5 mg via INTRAVENOUS
  Administered 2023-09-30: 3.5 mg via INTRAVENOUS
  Administered 2023-09-30: 1.5 mg via INTRAVENOUS
  Administered 2023-09-30: 0.5 mg via INTRAVENOUS
  Administered 2023-09-30: 3.5 mg via INTRAVENOUS
  Administered 2023-10-01: 6 mg via INTRAVENOUS
  Administered 2023-10-01: 4 mg via INTRAVENOUS
  Filled 2023-09-26 (×3): qty 30

## 2023-09-26 MED ORDER — HYDROMORPHONE HCL 1 MG/ML IJ SOLN: 1.0000 mg | INTRAMUSCULAR | Status: DC | PRN

## 2023-09-26 MED ORDER — KETOROLAC TROMETHAMINE 15 MG/ML IJ SOLN
15.0000 mg | Freq: Four times a day (QID) | INTRAMUSCULAR | Status: AC
  Administered 2023-09-26 – 2023-10-01 (×20): 15 mg via INTRAVENOUS
  Filled 2023-09-26 (×20): qty 1

## 2023-09-26 MED ORDER — MONTELUKAST SODIUM 10 MG PO TABS
10.0000 mg | ORAL_TABLET | Freq: Every day | ORAL | Status: DC
  Administered 2023-09-28 – 2023-09-30 (×4): 10 mg via ORAL
  Filled 2023-09-26 (×5): qty 1
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
## 2023-09-26 NOTE — Plan of Care (Signed)

## 2023-09-26 NOTE — Telephone Encounter (Signed)
Patient is being admitted. I spoke to patient and he has now received fluids.

## 2023-09-26 NOTE — ED Provider Notes (Signed)
[place] EMERGENCY DEPARTMENT AT [facility] Provider Note   CSN: [id number] Arrival date & time: 09/26/23  0330     History  Chief Complaint  Patient presents with   Sickle Cell Pain Crisis    [name] Hill is a 22 y.o. male.  The history is provided by the patient.  Sickle Cell Pain Crisis [name] Hill is a 22 y.o. male who presents to the Emergency Department complaining of sickle cell pain crisis.  He presents to the emergency department for evaluation of sickle cell pain that started around 1 AM and woke him from sleep.  He complains of severe central chest pain with this associated shortness of breath.  He also reports pain in low back and bilateral legs.  He has similar pain with pain crises, this often happens with season changes.  No fever, cough, nausea, vomiting, abdominal pain.     Home Medications Prior to Admission medications   Medication Sig Start Date End Date Taking? Authorizing Provider  albuterol (VENTOLIN HFA) 108 (90 Base) MCG/ACT inhaler Inhale 2 puffs into the lungs every 6 (six) hours as needed for wheezing or shortness of breath. 10/08/22  Yes Massie Maroon, FNP  ibuprofen (ADVIL) 800 MG tablet Take 1 tablet (800 mg total) by mouth every 8 (eight) hours as needed. 12/17/22  Yes Massie Maroon, FNP  losartan (COZAAR) 50 MG tablet Take 1 tablet (50 mg total) by mouth daily. 05/13/23  Yes Massie Maroon, FNP  Oxycodone HCl 10 MG TABS Take 1 tablet (10 mg total) by mouth every 4 (four) hours as needed (pain). 08/28/23  Yes Massie Maroon, FNP  montelukast (SINGULAIR) 10 MG tablet Take 1 tablet (10 mg total) by mouth at bedtime. Patient taking differently: Take 10 mg by mouth at bedtime as needed. 09/11/23 09/10/24  Ivonne Andrew, NP  Vitamin D, Ergocalciferol, (DRISDOL) 1.25 MG (50000 UNIT) CAPS capsule Take 1 capsule (50,000 Units total) by mouth every 7 (seven) days. 10/25/22   Donell Beers, FNP      Allergies     Lisinopril, Pork-derived products, and Shellfish allergy    Review of Systems   Review of Systems  All other systems reviewed and are negative.   Physical Exam Updated Vital Signs BP 121/62   Pulse 76   Temp 98 F (36.7 C) (Oral)   Resp (!) 21   Ht 5\' 7"  (1.702 m)   Wt 86.2 kg   SpO2 97%   BMI 29.76 kg/m  Physical Exam Vitals and nursing note reviewed.  Constitutional:      Appearance: He is well-developed.     Comments: Appears uncomfortable  HENT:     Head: Normocephalic and atraumatic.  Cardiovascular:     Rate and Rhythm: Normal rate and regular rhythm.     Heart sounds: No murmur heard. Pulmonary:     Effort: Pulmonary effort is normal. No respiratory distress.     Breath sounds: Normal breath sounds.  Abdominal:     Palpations: Abdomen is soft.     Tenderness: There is no abdominal tenderness. There is no guarding or rebound.  Musculoskeletal:        General: No swelling or tenderness.  Skin:    General: Skin is warm and dry.  Neurological:     Mental Status: He is alert and oriented to person, place, and time.  Psychiatric:        Behavior: Behavior normal.     ED Results /  Procedures / Treatments   Labs (all labs ordered are listed, but only abnormal results are displayed) Labs Reviewed  COMPREHENSIVE METABOLIC PANEL - Abnormal; Notable for the following components:      Result Value   Creatinine, Ser 0.48 (*)    AST 65 (*)    Total Bilirubin 3.7 (*)    All other components within normal limits  CBC WITH DIFFERENTIAL/PLATELET - Abnormal; Notable for the following components:   WBC 16.3 (*)    RBC 3.08 (*)    Hemoglobin 9.1 (*)    HCT 25.8 (*)    RDW 21.0 (*)    nRBC 0.8 (*)    Lymphs Abs 6.9 (*)    Monocytes Absolute 1.6 (*)    Basophils Absolute 0.2 (*)    Abs Immature Granulocytes 0.35 (*)    All other components within normal limits  RETICULOCYTES - Abnormal; Notable for the following components:   Retic Ct Pct 6.9 (*)    RBC. 3.05  (*)    Retic Count, Absolute 208.9 (*)    Immature Retic Fract 35.0 (*)    All other components within normal limits  TROPONIN I (HIGH SENSITIVITY)  TROPONIN I (HIGH SENSITIVITY)    EKG EKG Interpretation Date/Time:  Friday September 26 2023 03:39:10 EDT Ventricular Rate:  84 PR Interval:  215 QRS Duration:  92 QT Interval:  355 QTC Calculation: 420 R Axis:   85  Text Interpretation: Sinus rhythm Prolonged PR interval Probable anteroseptal infarct, old Borderline ST elevation, lateral leads Confirmed by Tilden Fossa [phone]) on 09/26/2023 3:55:30 AM  Radiology DG Chest Port 1 View  Result Date: 09/26/2023 CLINICAL DATA:  Shortness of breath. EXAM: PORTABLE CHEST 1 VIEW COMPARISON:  08/23/2023 FINDINGS: Lordotic rotated film. Cardiopericardial silhouette is at upper limits of normal for size. The lungs are clear without focal pneumonia, edema, pneumothorax or pleural effusion. Chronic bony changes consistent with the reported history of sickle cell disease. Avascular necrosis in the humeral heads not excluded. IMPRESSION: 1. No acute cardiopulmonary findings. Electronically Signed   By: Kennith Center M.D.   On: 09/26/2023 05:16    Procedures Procedures   CRITICAL CARE Performed by: Tilden Fossa   Total critical care time: 35 minutes  Critical care time was exclusive of separately billable procedures and treating other patients.  Critical care was necessary to treat or prevent imminent or life-threatening deterioration.  Critical care was time spent personally by me on the following activities: development of treatment plan with patient and/or surrogate as well as nursing, discussions with consultants, evaluation of patient's response to treatment, examination of patient, obtaining history from patient or surrogate, ordering and performing treatments and interventions, ordering and review of laboratory studies, ordering and review of radiographic studies, pulse oximetry and  re-evaluation of patient's condition.  Medications Ordered in ED Medications  diphenhydrAMINE (BENADRYL) capsule 25-50 mg (has no administration in time range)  ondansetron (ZOFRAN) injection 4 mg (has no administration in time range)  HYDROmorphone (DILAUDID) injection 1 mg (1 mg Intravenous Given 09/26/23 0355)  HYDROmorphone (DILAUDID) injection 1 mg (1 mg Intravenous Given 09/26/23 0515)  HYDROmorphone (DILAUDID) injection 1 mg (1 mg Intravenous Given 09/26/23 0535)  HYDROmorphone (DILAUDID) injection 1 mg (1 mg Intravenous Given 09/26/23 0605)  ketorolac (TORADOL) 15 MG/ML injection 15 mg (15 mg Intravenous Given 09/26/23 0102)    ED Course/ Medical Decision Making/ A&P  Medical Decision Making Amount and/or Complexity of Data Reviewed Labs: ordered. Radiology: ordered.  Risk Prescription drug management. Decision regarding hospitalization.   Patient with history of sickle cell anemia here for evaluation of chest pain, back pain, leg pain similar to his prior pain crises.  Patient significantly uncomfortable appearing on ED presentation.  He was treated with multiple rounds of pain medications with improvement in his pain but he does have ongoing pain.  Doubt PE, he did recently have a negative CTA PE study performed.  Chest x-ray without acute infiltrate.  Labs with stable anemia.  He does have leukocytosis with appropriate reticulocyte count.  Given patient's ongoing pain medicine consulted for admission for ongoing treatment.        Final Clinical Impression(s) / ED Diagnoses Final diagnoses:  None    Rx / DC Orders ED Discharge Orders     None         Tilden Fossa, MD 09/26/23 [phone]

## 2023-09-26 NOTE — ED Triage Notes (Signed)
Pt arrived POV d/t sickle cell crisis.  Pt states chest, back and bilat legs in severe pain.  He took his prescribed meds but did not help.

## 2023-09-27 DIAGNOSIS — D5700 Hb-SS disease with crisis, unspecified: Secondary | ICD-10-CM | POA: Diagnosis not present

## 2023-09-27 LAB — CBC
HCT: 24.1 % — ABNORMAL LOW (ref 39.0–52.0)
Hemoglobin: 8.7 g/dL — ABNORMAL LOW (ref 13.0–17.0)
MCH: 29.6 pg (ref 26.0–34.0)
MCHC: 36.1 g/dL — ABNORMAL HIGH (ref 30.0–36.0)
MCV: 82 fL (ref 80.0–100.0)
Platelets: 323 10*3/uL (ref 150–400)
RBC: 2.94 MIL/uL — ABNORMAL LOW (ref 4.22–5.81)
RDW: 20.5 % — ABNORMAL HIGH (ref 11.5–15.5)
WBC: 12.7 10*3/uL — ABNORMAL HIGH (ref 4.0–10.5)
nRBC: 2 % — ABNORMAL HIGH (ref 0.0–0.2)

## 2023-09-27 MED ORDER — DEXTROSE-SODIUM CHLORIDE 5-0.45 % IV SOLN: INTRAVENOUS | Status: AC

## 2023-09-27 NOTE — Progress Notes (Signed)
SICKLE CELL SERVICE PROGRESS NOTE  [name] Hill UEA:[id number] DOB: [birth year] DOA: 09/26/2023 PCP: Massie Maroon, FNP  Assessment/Plan: Principal Problem:   Sickle cell pain crisis (HCC) Active Problems:   Anemia of chronic disease   Leukocytosis   Chronic pain syndrome   History of asthma  Sickle cell pain crisis: Patient on Toradol, Dilaudid PCA on oral oxycodone.  Will continue titrating IV Dilaudid.  Continue close monitoring. Anemia of chronic disease: Hemoglobin at 8.7.  Was 9.1 yesterday.  Continue to monitor H&H. Leukocytosis: White count has dropped from 16.3-12.7.  Continue to monitor Chronic pain syndrome: Continue home medications. History of asthma: Continue home regimen  Code Status: Full code Family Communication: No family at bedside Disposition Plan: Home once ready  [facility]  Pager [phone]. If 7PM-7AM, please contact night-coverage.  09/27/2023, 5:15 PM  LOS: 1 day   Brief narrative: [name] Hill  is a 22 y.o. male with a medical history significant for sickle cell disease, chronic pain syndrome, history of mild intermittent asthma and anemia of chronic disease presented to the emergency department with complaints of pain to chest, back, and lower extremities.  Patient states that he awakened around 2 AM with increased pain.  He has not identified any provoking factors concerning crisis.  Currently rates pain as 9/10, constant, and occasionally sharp.  He has been taking his home medication of oxycodone without very much relief.  Patient denies any fever, chills, dizziness, fatigue, or shortness of breath.  No urinary symptoms, nausea, vomiting, or diarrhea. No sick contacts or recent travel.  Consultants: None  Procedures: None  Antibiotics: None  HPI/Subjective: Patient doing much better but still having 6 out of 10 pain.  Able to go to the bathroom.  Unable to walk currently flow yet.  No fever no chills.  No nausea vomiting or  diarrhea.  Objective: Vitals:   09/27/23 1207 09/27/23 1415 09/27/23 1623 09/27/23 1702  BP:  (!) 126/54  130/64  Pulse:  70  72  Resp: 16 20 20 19   Temp:  98.3 F (36.8 C)  99 F (37.2 C)  TempSrc:  Oral  Oral  SpO2: 96% 98% 98% 100%  Weight:      Height:       Weight change:   Intake/Output Summary (Last 24 hours) at 09/27/2023 1715 Last data filed at 09/27/2023 1625 Gross per 24 hour  Intake 0.5 ml  Output 2700 ml  Net -2699.5 ml    General: Alert, awake, oriented x3, in no acute distress.  HEENT: [place]/AT PEERL, EOMI Neck: Trachea midline,  no masses, no thyromegal,y no JVD, no carotid bruit OROPHARYNX:  Moist, No exudate/ erythema/lesions.  Heart: Regular rate and rhythm, without murmurs, rubs, gallops, PMI non-displaced, no heaves or thrills on palpation.  Lungs: Clear to auscultation, no wheezing or rhonchi noted. No increased vocal fremitus resonant to percussion  Abdomen: Soft, nontender, nondistended, positive bowel sounds, no masses no hepatosplenomegaly noted..  Neuro: No focal neurological deficits noted cranial nerves II through XII grossly intact. DTRs 2+ bilaterally upper and lower extremities. Strength 5 out of 5 in bilateral upper and lower extremities. Musculoskeletal: No warm swelling or erythema around joints, no spinal tenderness noted. Psychiatric: Patient alert and oriented x3, good insight and cognition, good recent to remote recall. Lymph node survey: No cervical axillary or inguinal lymphadenopathy noted.   Data Reviewed: Basic Metabolic Panel: Recent Labs  Lab 09/26/23 0342  NA 139  K 4.9  CL 103  CO2 26  GLUCOSE 97  BUN 12  CREATININE 0.48*  CALCIUM 9.3   Liver Function Tests: Recent Labs  Lab 09/26/23 0342  AST 65*  ALT 34  ALKPHOS 68  BILITOT 3.7*  PROT 7.3  ALBUMIN 4.5   No results for input(s): "LIPASE", "AMYLASE" in the last 168 hours. No results for input(s): "AMMONIA" in the last 168 hours. CBC: Recent Labs  Lab  09/26/23 0342 09/27/23 0447  WBC 16.3* 12.7*  NEUTROABS 6.9  --   HGB 9.1* 8.7*  HCT 25.8* 24.1*  MCV 83.8 82.0  PLT 387 323   Cardiac Enzymes: No results for input(s): "CKTOTAL", "CKMB", "CKMBINDEX", "TROPONINI" in the last 168 hours. BNP (last 3 results) No results for input(s): "BNP" in the last 8760 hours.  ProBNP (last 3 results) No results for input(s): "PROBNP" in the last 8760 hours.  CBG: No results for input(s): "GLUCAP" in the last 168 hours.  No results found for this or any previous visit (from the past 240 hour(s)).   Studies: DG Chest Port 1 View  Result Date: 09/26/2023 CLINICAL DATA:  Shortness of breath. EXAM: PORTABLE CHEST 1 VIEW COMPARISON:  08/23/2023 FINDINGS: Lordotic rotated film. Cardiopericardial silhouette is at upper limits of normal for size. The lungs are clear without focal pneumonia, edema, pneumothorax or pleural effusion. Chronic bony changes consistent with the reported history of sickle cell disease. Avascular necrosis in the humeral heads not excluded. IMPRESSION: 1. No acute cardiopulmonary findings. Electronically Signed   By: Kennith Center M.D.   On: 09/26/2023 05:16   CT Angio Chest PE W and/or Wo Contrast  Result Date: 09/12/2023 CLINICAL DATA:  Positive D-dimer and chest pain, history of sickle cell, initial encounter EXAM: CT ANGIOGRAPHY CHEST WITH CONTRAST TECHNIQUE: Multidetector CT imaging of the chest was performed using the standard protocol during bolus administration of intravenous contrast. Multiplanar CT image reconstructions and MIPs were obtained to evaluate the vascular anatomy. RADIATION DOSE REDUCTION: This exam was performed according to the departmental dose-optimization program which includes automated exposure control, adjustment of the mA and/or kV according to patient size and/or use of iterative reconstruction technique. CONTRAST:  80mL OMNIPAQUE IOHEXOL 350 MG/ML SOLN COMPARISON:  Chest x-ray from 08/23/2023 FINDINGS:  Cardiovascular: Thoracic aorta is well visualized and within normal limits. Heart is at the upper limits of normal in size. The pulmonary artery is well visualized within normal branching pattern. No intraluminal filling defect to suggest pulmonary embolism is noted. Mediastinum/Nodes: Thoracic inlet is within normal limits. No hilar or mediastinal adenopathy is noted. The esophagus as visualized is within normal limits. Lungs/Pleura: Lungs are well aerated bilaterally. Minimal dependent atelectatic changes are seen. No focal pulmonary nodule or infiltrate is noted. Upper Abdomen: Visualized upper abdomen is within normal limits. Musculoskeletal: Bony structures demonstrate some generalized increased sclerosis consistent with the underlying given clinical history of sickle cell disease. Endplate concavity is again noted Review of the MIP images confirms the above findings. IMPRESSION: No evidence of pulmonary emboli. Changes consistent with the given clinical history of sickle cell disease. No other focal abnormality is noted. Electronically Signed   By: Alcide Clever M.D.   On: 09/12/2023 17:39    Scheduled Meds:  HYDROmorphone   Intravenous Q4H   ketorolac  15 mg Intravenous Q6H   montelukast  10 mg Oral QHS   senna-docusate  1 tablet Oral BID   Continuous Infusions:  dextrose 5 % and 0.45 % NaCl 10 mL/hr at 09/27/23 1625    Principal Problem:  Sickle cell pain crisis (HCC) Active Problems:   Anemia of chronic disease   Leukocytosis   Chronic pain syndrome   History of asthma

## 2023-09-28 DIAGNOSIS — D5700 Hb-SS disease with crisis, unspecified: Secondary | ICD-10-CM | POA: Diagnosis not present

## 2023-09-28 LAB — COMPREHENSIVE METABOLIC PANEL
ALT: 22 U/L (ref 0–44)
AST: 34 U/L (ref 15–41)
Albumin: 3.7 g/dL (ref 3.5–5.0)
Alkaline Phosphatase: 56 U/L (ref 38–126)
Anion gap: 10 (ref 5–15)
BUN: 9 mg/dL (ref 6–20)
CO2: 25 mmol/L (ref 22–32)
Calcium: 8.9 mg/dL (ref 8.9–10.3)
Chloride: 103 mmol/L (ref 98–111)
Creatinine, Ser: 0.43 mg/dL — ABNORMAL LOW (ref 0.61–1.24)
GFR, Estimated: >60 mL/min (ref >60)
Glucose, Bld: 86 mg/dL (ref 70–99)
Potassium: 4.6 mmol/L (ref 3.5–5.1)
Sodium: 138 mmol/L (ref 135–145)
Total Bilirubin: 2.4 mg/dL — ABNORMAL HIGH (ref 0.3–1.2)
Total Protein: 6.5 g/dL (ref 6.5–8.1)

## 2023-09-28 NOTE — Progress Notes (Signed)
SICKLE CELL SERVICE PROGRESS NOTE  [name] Hill:[id number] DOB: 06-07-2001 DOA: 09/26/2023 PCP: Massie Maroon, FNP  Assessment/Plan: Principal Problem:   Sickle cell pain crisis (HCC) Active Problems:   Anemia of chronic disease   Leukocytosis   Chronic pain syndrome   History of asthma  Sickle cell pain crisis: Patient on Toradol, Dilaudid PCA on oral oxycodone.  Will continue titrating IV Dilaudid.  Continue close monitoring.  Patient continues to have pain despite current treatment.  Hemoglobin is staying off.  Continue oral medications. Anemia of chronic disease: Hemoglobin at 8.7 yesterday.  Continue to monitor H&H. Leukocytosis: White count has dropped from 16.3-12.7.  Continue to monitor Chronic pain syndrome: Continue home medications. History of asthma: Continue home regimen  Code Status: Full code Family Communication: No family at bedside Disposition Plan: Home once ready  [facility]  Pager [phone]. If 7PM-7AM, please contact night-coverage.  09/28/2023, 9:21 PM  LOS: 2 days   Brief narrative: [name] Hill  is a 22 y.o. male with a medical history significant for sickle cell disease, chronic pain syndrome, history of mild intermittent asthma and anemia of chronic disease presented to the emergency department with complaints of pain to chest, back, and lower extremities.  Patient states that he awakened around 2 AM with increased pain.  He has not identified any provoking factors concerning crisis.  Currently rates pain as 9/10, constant, and occasionally sharp.  He has been taking his home medication of oxycodone without very much relief.  Patient denies any fever, chills, dizziness, fatigue, or shortness of breath.  No urinary symptoms, nausea, vomiting, or diarrhea. No sick contacts or recent travel.  Consultants: None  Procedures: None  Antibiotics: None  HPI/Subjective: Patient doing much better but still having 6 out of 10 pain.  He has been  improving but still is talking about back pain.  That does not stop patient from doing basic stuff.  Objective: Vitals:   09/28/23 1640 09/28/23 1817 09/28/23 1955 09/28/23 2016  BP:  139/71 123/64   Pulse:  80 74   Resp: (!) 25 18 18 18   Temp:  98.5 F (36.9 C) 98.7 F (37.1 C)   TempSrc:  Oral Oral   SpO2: 96% 98% 98% 98%  Weight:      Height:       Weight change:   Intake/Output Summary (Last 24 hours) at 09/28/2023 2121 Last data filed at 09/28/2023 2016 Gross per 24 hour  Intake 506.5 ml  Output 2700 ml  Net -2193.5 ml    General: Alert, awake, oriented x3, in no acute distress.  HEENT: [place]/AT PEERL, EOMI Neck: Trachea midline,  no masses, no thyromegal,y no JVD, no carotid bruit OROPHARYNX:  Moist, No exudate/ erythema/lesions.  Heart: Regular rate and rhythm, without murmurs, rubs, gallops, PMI non-displaced, no heaves or thrills on palpation.  Lungs: Clear to auscultation, no wheezing or rhonchi noted. No increased vocal fremitus resonant to percussion  Abdomen: Soft, nontender, nondistended, positive bowel sounds, no masses no hepatosplenomegaly noted..  Neuro: No focal neurological deficits noted cranial nerves II through XII grossly intact. DTRs 2+ bilaterally upper and lower extremities. Strength 5 out of 5 in bilateral upper and lower extremities. Musculoskeletal: No warm swelling or erythema around joints, no spinal tenderness noted. Psychiatric: Patient alert and oriented x3, good insight and cognition, good recent to remote recall. Lymph node survey: No cervical axillary or inguinal lymphadenopathy noted.   Data Reviewed: Basic Metabolic Panel: Recent Labs  Lab 09/26/23 0342  09/28/23 1040  NA 139 138  K 4.9 4.6  CL 103 103  CO2 26 25  GLUCOSE 97 86  BUN 12 9  CREATININE 0.48* 0.43*  CALCIUM 9.3 8.9   Liver Function Tests: Recent Labs  Lab 09/26/23 0342 09/28/23 1040  AST 65* 34  ALT 34 22  ALKPHOS 68 56  BILITOT 3.7* 2.4*  PROT 7.3 6.5   ALBUMIN 4.5 3.7   No results for input(s): "LIPASE", "AMYLASE" in the last 168 hours. No results for input(s): "AMMONIA" in the last 168 hours. CBC: Recent Labs  Lab 09/26/23 0342 09/27/23 0447  WBC 16.3* 12.7*  NEUTROABS 6.9  --   HGB 9.1* 8.7*  HCT 25.8* 24.1*  MCV 83.8 82.0  PLT 387 323   Cardiac Enzymes: No results for input(s): "CKTOTAL", "CKMB", "CKMBINDEX", "TROPONINI" in the last 168 hours. BNP (last 3 results) No results for input(s): "BNP" in the last 8760 hours.  ProBNP (last 3 results) No results for input(s): "PROBNP" in the last 8760 hours.  CBG: No results for input(s): "GLUCAP" in the last 168 hours.  No results found for this or any previous visit (from the past 240 hour(s)).   Studies: DG Chest Port 1 View  Result Date: 09/26/2023 CLINICAL DATA:  Shortness of breath. EXAM: PORTABLE CHEST 1 VIEW COMPARISON:  08/23/2023 FINDINGS: Lordotic rotated film. Cardiopericardial silhouette is at upper limits of normal for size. The lungs are clear without focal pneumonia, edema, pneumothorax or pleural effusion. Chronic bony changes consistent with the reported history of sickle cell disease. Avascular necrosis in the humeral heads not excluded. IMPRESSION: 1. No acute cardiopulmonary findings. Electronically Signed   By: Kennith Center M.D.   On: 09/26/2023 05:16   CT Angio Chest PE W and/or Wo Contrast  Result Date: 09/12/2023 CLINICAL DATA:  Positive D-dimer and chest pain, history of sickle cell, initial encounter EXAM: CT ANGIOGRAPHY CHEST WITH CONTRAST TECHNIQUE: Multidetector CT imaging of the chest was performed using the standard protocol during bolus administration of intravenous contrast. Multiplanar CT image reconstructions and MIPs were obtained to evaluate the vascular anatomy. RADIATION DOSE REDUCTION: This exam was performed according to the departmental dose-optimization program which includes automated exposure control, adjustment of the mA and/or kV  according to patient size and/or use of iterative reconstruction technique. CONTRAST:  80mL OMNIPAQUE IOHEXOL 350 MG/ML SOLN COMPARISON:  Chest x-ray from 08/23/2023 FINDINGS: Cardiovascular: Thoracic aorta is well visualized and within normal limits. Heart is at the upper limits of normal in size. The pulmonary artery is well visualized within normal branching pattern. No intraluminal filling defect to suggest pulmonary embolism is noted. Mediastinum/Nodes: Thoracic inlet is within normal limits. No hilar or mediastinal adenopathy is noted. The esophagus as visualized is within normal limits. Lungs/Pleura: Lungs are well aerated bilaterally. Minimal dependent atelectatic changes are seen. No focal pulmonary nodule or infiltrate is noted. Upper Abdomen: Visualized upper abdomen is within normal limits. Musculoskeletal: Bony structures demonstrate some generalized increased sclerosis consistent with the underlying given clinical history of sickle cell disease. Endplate concavity is again noted Review of the MIP images confirms the above findings. IMPRESSION: No evidence of pulmonary emboli. Changes consistent with the given clinical history of sickle cell disease. No other focal abnormality is noted. Electronically Signed   By: Alcide Clever M.D.   On: 09/12/2023 17:39    Scheduled Meds:  HYDROmorphone   Intravenous Q4H   ketorolac  15 mg Intravenous Q6H   montelukast  10 mg Oral QHS  senna-docusate  1 tablet Oral BID   Continuous Infusions:    Principal Problem:   Sickle cell pain crisis (HCC) Active Problems:   Anemia of chronic disease   Leukocytosis   Chronic pain syndrome   History of asthma

## 2023-09-28 NOTE — Plan of Care (Signed)

## 2023-09-29 LAB — CBC WITH DIFFERENTIAL/PLATELET
Abs Immature Granulocytes: 0.09 10*3/uL — ABNORMAL HIGH (ref 0.00–0.07)
Basophils Absolute: 0.1 10*3/uL (ref 0.0–0.1)
Basophils Relative: 0 %
Eosinophils Absolute: 0.4 10*3/uL (ref 0.0–0.5)
Eosinophils Relative: 4 %
HCT: 27.3 % — ABNORMAL LOW (ref 39.0–52.0)
Hemoglobin: 9.7 g/dL — ABNORMAL LOW (ref 13.0–17.0)
Immature Granulocytes: 1 %
Lymphocytes Relative: 24 %
Lymphs Abs: 2.9 10*3/uL (ref 0.7–4.0)
MCH: 29.8 pg (ref 26.0–34.0)
MCHC: 35.5 g/dL (ref 30.0–36.0)
MCV: 84 fL (ref 80.0–100.0)
Monocytes Absolute: 1.6 10*3/uL — ABNORMAL HIGH (ref 0.1–1.0)
Monocytes Relative: 13 %
Neutro Abs: 7.2 10*3/uL (ref 1.7–7.7)
Neutrophils Relative %: 58 %
Platelets: 341 10*3/uL (ref 150–400)
RBC: 3.25 MIL/uL — ABNORMAL LOW (ref 4.22–5.81)
RDW: 19.6 % — ABNORMAL HIGH (ref 11.5–15.5)
WBC: 12.3 10*3/uL — ABNORMAL HIGH (ref 4.0–10.5)
nRBC: 1.2 % — ABNORMAL HIGH (ref 0.0–0.2)

## 2023-09-29 LAB — PREPARE RBC (CROSSMATCH)

## 2023-09-29 MED ORDER — IPRATROPIUM-ALBUTEROL 0.5-2.5 (3) MG/3ML IN SOLN
3.0000 mL | Freq: Three times a day (TID) | RESPIRATORY_TRACT | Status: DC
  Administered 2023-09-29 – 2023-09-30 (×2): 3 mL via RESPIRATORY_TRACT
  Filled 2023-09-29 (×2): qty 3

## 2023-09-29 MED ORDER — IPRATROPIUM-ALBUTEROL 0.5-2.5 (3) MG/3ML IN SOLN
3.0000 mL | RESPIRATORY_TRACT | Status: DC
  Administered 2023-09-29: 3 mL via RESPIRATORY_TRACT
  Filled 2023-09-29: qty 3

## 2023-09-29 MED ORDER — SODIUM CHLORIDE 0.9% IV SOLUTION: Freq: Once | INTRAVENOUS | Status: AC

## 2023-09-29 NOTE — Progress Notes (Signed)
Secure chat sent to RN to coordinate time for therapeutic phlebotomy due to transfusion order. RN agrees to notify VAS Team when blood is ready.

## 2023-09-29 NOTE — Plan of Care (Signed)

## 2023-09-29 NOTE — Progress Notes (Signed)
Subjective: [name] Hill is a 22 year old male with a medical history significant for sickle cell disease, chronic pain syndrome, opiate dependence and tolerance, and mild intermittent asthma that was admitted for sickle cell pain crisis. Patient continues to complain of worsening chest pain.  Patient rates his pain as 7/10. He denies any headache, fever, blurry vision, dizziness, or shortness of breath.  No urinary symptoms, nausea, vomiting, or diarrhea.  Objective:  Vital signs in last 24 hours:  Vitals:   09/30/23 0051 09/30/23 0136 09/30/23 0355 09/30/23 0450  BP: (!) 118/59  114/62   Pulse: 87  71   Resp: 20  20 20   Temp: (!) 101.2 F (38.4 C) 99.7 F (37.6 C) 98.8 F (37.1 C)   TempSrc: Oral Oral Oral   SpO2: 93%  97% 97%  Weight:      Height:        Intake/Output from previous day:   Intake/Output Summary (Last 24 hours) at 09/30/2023 0529 Last data filed at 09/30/2023 0354 Gross per 24 hour  Intake 396 ml  Output 2550 ml  Net -2154 ml    Physical Exam: General: Alert, awake, oriented x3, in no acute distress.  HEENT: [place]/AT PEERL, EOMI Neck: Trachea midline,  no masses, no thyromegal,y no JVD, no carotid bruit OROPHARYNX:  Moist, No exudate/ erythema/lesions.  Heart: Regular rate and rhythm, without murmurs, rubs, gallops, PMI non-displaced, no heaves or thrills on palpation.  Lungs: Clear to auscultation, no wheezing or rhonchi noted. No increased vocal fremitus resonant to percussion  Abdomen: Soft, nontender, nondistended, positive bowel sounds, no masses no hepatosplenomegaly noted..  Neuro: No focal neurological deficits noted cranial nerves II through XII grossly intact. DTRs 2+ bilaterally upper and lower extremities. Strength 5 out of 5 in bilateral upper and lower extremities. Musculoskeletal: No warm swelling or erythema around joints, no spinal tenderness noted. Psychiatric: Patient alert and oriented x3, good insight and cognition, good recent to remote  recall. Lymph node survey: No cervical axillary or inguinal lymphadenopathy noted.  Lab Results:  Basic Metabolic Panel:    Component Value Date/Time   NA 138 09/28/2023 1040   NA 139 09/11/2023 1431   K 4.6 09/28/2023 1040   CL 103 09/28/2023 1040   CO2 25 09/28/2023 1040   BUN 9 09/28/2023 1040   BUN 10 09/11/2023 1431   CREATININE 0.43 (L) 09/28/2023 1040   GLUCOSE 86 09/28/2023 1040   CALCIUM 8.9 09/28/2023 1040   CBC:    Component Value Date/Time   WBC 12.3 (H) 09/29/2023 0529   HGB 9.7 (L) 09/29/2023 0529   HGB 9.4 (L) 09/11/2023 1431   HCT 27.3 (L) 09/29/2023 0529   HCT 27.4 (L) 09/11/2023 1431   PLT 341 09/29/2023 0529   PLT 371 09/11/2023 1431   MCV 84.0 09/29/2023 0529   MCV 85 09/11/2023 1431   NEUTROABS 7.2 09/29/2023 0529   NEUTROABS 4.9 01/21/2023 1026   LYMPHSABS 2.9 09/29/2023 0529   LYMPHSABS 3.8 (H) 01/21/2023 1026   MONOABS 1.6 (H) 09/29/2023 0529   EOSABS 0.4 09/29/2023 0529   EOSABS 0.2 01/21/2023 1026   BASOSABS 0.1 09/29/2023 0529   BASOSABS 0.1 01/21/2023 1026    No results found for this or any previous visit (from the past 240 hour(s)).  Studies/Results: No results found.  Medications: Scheduled Meds:  sodium chloride   Intravenous Once   HYDROmorphone   Intravenous Q4H   ipratropium-albuterol  3 mL Nebulization TID   ketorolac  15 mg Intravenous Q6H  montelukast  10 mg Oral QHS   senna-docusate  1 tablet Oral BID   Continuous Infusions: PRN Meds:.acetaminophen, albuterol, diphenhydrAMINE, naloxone **AND** sodium chloride flush, ondansetron (ZOFRAN) IV, mouth rinse, oxyCODONE, polyethylene glycol  Consultants: none  Procedures: none  Antibiotics: none  Assessment/Plan: Principal Problem:   Sickle cell pain crisis (HCC) Active Problems:   Anemia of chronic disease   Leukocytosis   Chronic pain syndrome   History of asthma  Sickle cell disease with pain crisis: Pain persists despite IV Dilaudid PCA. Patient  complains of worsening chest pain.  May benefit from simple exchange.  Removed 250 cc and transfuse 1 unit PRBCs to follow. Toradol 15 mg IV every 6 hours Monitor vital signs very closely, reevaluate pain scale regularly, and supplemental oxygen as needed  Anemia of chronic disease: Stable and consistent with patient's baseline.  Patient will undergo simple exchange today.  Follow labs in AM.  Chronic pain syndrome: Continue home medications  Mild intermittent asthma: Stable.  Continue home medications as needed. Code Status: Full Code Family Communication: N/A Disposition Plan: Not yet ready for discharge  [name] Hill Rennis Petty  APRN, MSN, FNP-C [facility] [address] [place], [place] [address] [phone]  If 7PM-7AM, please contact night-coverage.  09/30/2023, 5:29 AM  LOS: 4 days

## 2023-09-30 DIAGNOSIS — D5700 Hb-SS disease with crisis, unspecified: Secondary | ICD-10-CM | POA: Diagnosis not present

## 2023-09-30 MED ORDER — IPRATROPIUM-ALBUTEROL 0.5-2.5 (3) MG/3ML IN SOLN
3.0000 mL | Freq: Two times a day (BID) | RESPIRATORY_TRACT | Status: DC
  Administered 2023-09-30 – 2023-10-01 (×2): 3 mL via RESPIRATORY_TRACT
  Filled 2023-09-30 (×2): qty 3

## 2023-09-30 MED ORDER — ACETAMINOPHEN 325 MG PO TABS
650.0000 mg | ORAL_TABLET | Freq: Four times a day (QID) | ORAL | Status: DC | PRN
  Administered 2023-09-30: 650 mg via ORAL
  Filled 2023-09-30: qty 2

## 2023-09-30 NOTE — Plan of Care (Signed)

## 2023-09-30 NOTE — Plan of Care (Signed)
  Problem: Respiratory: Goal: Pulmonary complications will be avoided or minimized Outcome: Progressing   Problem: Sensory: Goal: Pain level will decrease with appropriate interventions Outcome: Progressing   Problem: Clinical Measurements: Goal: Will remain free from infection Outcome: Progressing   Problem: Pain Management: Goal: General experience of comfort will improve Outcome: Progressing

## 2023-09-30 NOTE — TOC CM/SW Note (Signed)
Transition of Care [facility]) - Inpatient Brief Assessment   Patient Details  Name: [name] Hill MRN: [id number] Date of Birth: 2001-11-14  Transition of Care [facility]) CM/SW Contact:    Darleene Cleaver, LCSW Phone Number: 09/30/2023, 7:37 PM   Clinical Narrative:  Sickle Cell patient, no SDOH needs, no anticipated TOC needs.  Transition of Care Asessment: Insurance and Status: Insurance coverage has been reviewed Patient has primary care physician: Yes Home environment has been reviewed: Yes Prior level of function:: Indep Prior/Current Home Services: No current home services Social Determinants of Health Reivew: SDOH reviewed no interventions necessary Readmission risk has been reviewed: Yes Transition of care needs: no transition of care needs at this time

## 2023-09-30 NOTE — Progress Notes (Signed)
Subjective: [name] Hill is a 22 year old male with a medical history significant for sickle cell disease, chronic pain syndrome, opiate dependence and tolerance, and mild intermittent asthma that was admitted for sickle cell pain crisis. Patient continues to complain of worsening chest pain.  Patient rates his pain as 6/10.  Patient states that pain intensity has improved somewhat overnight.  He denies any headache, fever, blurry vision, dizziness, or shortness of breath.  No urinary symptoms, nausea, vomiting, or diarrhea.  Objective:  Vital signs in last 24 hours:  Vitals:   09/30/23 0953 09/30/23 1245 09/30/23 1253 09/30/23 1607  BP: (!) 141/63  124/65   Pulse: 98  82   Resp: 20 (!) 24 18 20   Temp: 98.6 F (37 C)  98.8 F (37.1 C)   TempSrc: Oral  Oral   SpO2: 94% 93% 96% 95%  Weight:      Height:        Intake/Output from previous day:   Intake/Output Summary (Last 24 hours) at 09/30/2023 1709 Last data filed at 09/30/2023 1200 Gross per 24 hour  Intake 876 ml  Output 250 ml  Net 626 ml    Physical Exam: General: Alert, awake, oriented x3, in no acute distress.  HEENT: [place]/AT PEERL, EOMI Neck: Trachea midline,  no masses, no thyromegal,y no JVD, no carotid bruit OROPHARYNX:  Moist, No exudate/ erythema/lesions.  Heart: Regular rate and rhythm, without murmurs, rubs, gallops, PMI non-displaced, no heaves or thrills on palpation.  Lungs: Clear to auscultation, no wheezing or rhonchi noted. No increased vocal fremitus resonant to percussion  Abdomen: Soft, nontender, nondistended, positive bowel sounds, no masses no hepatosplenomegaly noted..  Neuro: No focal neurological deficits noted cranial nerves II through XII grossly intact. DTRs 2+ bilaterally upper and lower extremities. Strength 5 out of 5 in bilateral upper and lower extremities. Musculoskeletal: No warm swelling or erythema around joints, no spinal tenderness noted. Psychiatric: Patient alert and oriented x3,  good insight and cognition, good recent to remote recall. Lymph node survey: No cervical axillary or inguinal lymphadenopathy noted.  Lab Results:  Basic Metabolic Panel:    Component Value Date/Time   NA 138 09/28/2023 1040   NA 139 09/11/2023 1431   K 4.6 09/28/2023 1040   CL 103 09/28/2023 1040   CO2 25 09/28/2023 1040   BUN 9 09/28/2023 1040   BUN 10 09/11/2023 1431   CREATININE 0.43 (L) 09/28/2023 1040   GLUCOSE 86 09/28/2023 1040   CALCIUM 8.9 09/28/2023 1040   CBC:    Component Value Date/Time   WBC 12.3 (H) 09/29/2023 0529   HGB 9.7 (L) 09/29/2023 0529   HGB 9.4 (L) 09/11/2023 1431   HCT 27.3 (L) 09/29/2023 0529   HCT 27.4 (L) 09/11/2023 1431   PLT 341 09/29/2023 0529   PLT 371 09/11/2023 1431   MCV 84.0 09/29/2023 0529   MCV 85 09/11/2023 1431   NEUTROABS 7.2 09/29/2023 0529   NEUTROABS 4.9 01/21/2023 1026   LYMPHSABS 2.9 09/29/2023 0529   LYMPHSABS 3.8 (H) 01/21/2023 1026   MONOABS 1.6 (H) 09/29/2023 0529   EOSABS 0.4 09/29/2023 0529   EOSABS 0.2 01/21/2023 1026   BASOSABS 0.1 09/29/2023 0529   BASOSABS 0.1 01/21/2023 1026    No results found for this or any previous visit (from the past 240 hour(s)).  Studies/Results: No results found.  Medications: Scheduled Meds:  HYDROmorphone   Intravenous Q4H   ipratropium-albuterol  3 mL Nebulization BID   ketorolac  15 mg Intravenous Q6H  montelukast  10 mg Oral QHS   senna-docusate  1 tablet Oral BID   Continuous Infusions: PRN Meds:.acetaminophen, albuterol, diphenhydrAMINE, naloxone **AND** sodium chloride flush, ondansetron (ZOFRAN) IV, mouth rinse, oxyCODONE, polyethylene glycol  Consultants: none  Procedures: none  Antibiotics: none  Assessment/Plan: Principal Problem:   Sickle cell pain crisis (HCC) Active Problems:   Anemia of chronic disease   Leukocytosis   Chronic pain syndrome   History of asthma  Sickle cell disease with pain crisis: Pain persists despite IV Dilaudid  PCA. Patient complains of worsening chest pain.  Patient is status post simple exchange on 09/29/2023. Toradol 15 mg IV every 6 hours Monitor vital signs very closely, reevaluate pain scale regularly, and supplemental oxygen as needed  Anemia of chronic disease: Stable and consistent with patient's baseline.  Follow labs in AM.  Chronic pain syndrome: Continue home medications  Mild intermittent asthma: Stable.  Continue home medications as needed. Code Status: Full Code Family Communication: N/A Disposition Plan: Not yet ready for discharge.  Discharge planned for 10/01/2023  Nolon Nations  APRN, MSN, FNP-C Patient Care [facility] [address] [place], [place] [address] [phone]  If 7PM-7AM, please contact night-coverage.  09/30/2023, 5:09 PM  LOS: 4 days

## 2023-10-01 DIAGNOSIS — D5700 Hb-SS disease with crisis, unspecified: Secondary | ICD-10-CM | POA: Diagnosis not present

## 2023-10-01 LAB — CBC
HCT: 23.4 % — ABNORMAL LOW (ref 39.0–52.0)
Hemoglobin: 8.3 g/dL — ABNORMAL LOW (ref 13.0–17.0)
MCH: 29.7 pg (ref 26.0–34.0)
MCHC: 35.5 g/dL (ref 30.0–36.0)
MCV: 83.9 fL (ref 80.0–100.0)
Platelets: 332 10*3/uL (ref 150–400)
RBC: 2.79 MIL/uL — ABNORMAL LOW (ref 4.22–5.81)
RDW: 18.6 % — ABNORMAL HIGH (ref 11.5–15.5)
WBC: 12.8 10*3/uL — ABNORMAL HIGH (ref 4.0–10.5)
nRBC: 0 % (ref 0.0–0.2)

## 2023-10-01 LAB — TYPE AND SCREEN
ABO/RH(D): O POS
Antibody Screen: NEGATIVE
Unit division: 0

## 2023-10-01 LAB — BPAM RBC
Blood Product Expiration Date: 202411172359
ISSUE DATE / TIME: 202411050029
Unit Type and Rh: 5100

## 2023-10-01 MED ORDER — HEPARIN SOD (PORK) LOCK FLUSH 100 UNIT/ML IV SOLN: 500.0000 [IU] | Freq: Once | INTRAVENOUS | Status: DC

## 2023-10-01 NOTE — Plan of Care (Signed)
  Problem: Clinical Measurements: Goal: Respiratory complications will improve Outcome: Progressing   Problem: Pain Management: Goal: General experience of comfort will improve Outcome: Progressing

## 2023-10-01 NOTE — Discharge Summary (Signed)
Physician Discharge Summary  [name] Hill AOZ:[id number] DOB: 29-Apr-2001 DOA: 09/26/2023  PCP: Massie Maroon, FNP  Admit date: 09/26/2023  Discharge date: 10/01/2023  Discharge Diagnoses:  Principal Problem:   Sickle cell pain crisis (HCC) Active Problems:   Anemia of chronic disease   Leukocytosis   Chronic pain syndrome   History of asthma   Discharge Condition: Stable  Disposition:   Follow-up Information     Massie Maroon, FNP Follow up in 1 week(s).   Specialty: Family Medicine Contact information: [address] [place] [address] [phone]                Pt is discharged home in good condition and is to follow up with Massie Maroon, FNP this week to have labs evaluated. [name] Hill is instructed to increase activity slowly and balance with rest for the next few days, and use prescribed medication to complete treatment of pain  Diet: Regular Wt Readings from Last 3 Encounters:  09/26/23 86.2 kg  09/12/23 86.2 kg  09/11/23 84.8 kg    History of present illness:  [name] Hill  is a 22 y.o. male with a medical history significant for sickle cell disease, chronic pain syndrome, history of mild intermittent asthma and anemia of chronic disease presented to the emergency department with complaints of pain to chest, back, and lower extremities.  Patient states that he awakened around 2 AM with increased pain.  He has not identified any provoking factors concerning crisis.  Currently rates pain as 9/10, constant, and occasionally sharp.  He has been taking his home medication of oxycodone without very much relief.  Patient denies any fever, chills, dizziness, fatigue, or shortness of breath.  No urinary symptoms, nausea, vomiting, or diarrhea. No sick contacts or recent travel.   ER course: Vital signs show:BP 121/68 (BP Location: Left Arm)   Pulse 61   Temp 97.7 F (36.5 C) (Oral)   Resp 16   Ht 5\' 7"  (1.702 m)   Wt 86.2 kg    SpO2 92%   BMI 29.76 kg/m  Complete blood count notable for hemoglobin 9.1, WBC 16.3, and platelets 387,000.Marland Kitchen  Reticulocyte percentage 6.9.  Complete metabolic panel notable for total bilirubin 3.7, and AST 65, otherwise unremarkable.  Patient's pain persists despite IV Dilaudid, IV fluids, and IV Toradol.  Patient admitted for further management of sickle cell pain crisis.  Hospital Course:  Patient was admitted for sickle cell pain crisis and managed appropriately with IVF, IV Dilaudid via PCA and IV Toradol, as well as other adjunct therapies per sickle cell pain management protocols.  IV Dilaudid PCA weaned appropriately and patient was transitioned to home medications. Anemia of chronic disease: Patient's hemoglobin is stable and consistent with his baseline.  During admission, underwent simple exchange transfusion.  Removed 250 cc, transfused 1 unit PRBCs to follow.  Patient will follow-up with PCP for medication management.  Resume all home medications.  Patient was therefore discharged home today in a hemodynamically stable condition.   [name] Hill will follow-up with PCP within 1 week of this discharge. [name] Hill was counseled extensively about nonpharmacologic means of pain management, patient verbalized understanding and was appreciative of  the care received during this admission.   We discussed the need for good hydration, monitoring of hydration status, avoidance of heat, cold, stress, and infection triggers.  Patient was reminded of the need to seek medical attention immediately if any symptom of bleeding, anemia, or infection occurs.  Discharge Exam: Vitals:   10/01/23 0754 10/01/23 0951  BP:    Pulse:    Resp: 17   Temp:    SpO2: 99% 99%   Vitals:   10/01/23 0500 10/01/23 0613 10/01/23 0754 10/01/23 0951  BP:  (!) 125/44    Pulse:  62    Resp: 14 14 17    Temp:  98.7 F (37.1 C)    TempSrc:  Oral    SpO2: 98% 98% 99% 99%  Weight:      Height:        General appearance  : Awake, alert, not in any distress. Speech Clear. Not toxic looking HEENT: Atraumatic and Normocephalic, pupils equally reactive to light and accomodation Neck: Supple, no JVD. No cervical lymphadenopathy.  Chest: Good air entry bilaterally, no added sounds  CVS: S1 S2 regular, no murmurs.  Abdomen: Bowel sounds present, Non tender and not distended with no gaurding, rigidity or rebound. Extremities: B/L Lower Ext shows no edema, both legs are warm to touch Neurology: Awake alert, and oriented X 3, CN II-XII intact, Non focal Skin: No Rash  Discharge Instructions  Discharge Instructions     Discharge patient   Complete by: As directed    Discharge disposition: 01-Home or Self Care   Discharge patient date: 10/01/2023      Allergies as of 10/01/2023       Reactions   Lisinopril Cough   Pork-derived Products Other (See Comments)   Patient does eat pork for religious reasons   Shellfish Allergy Other (See Comments)   Pt does not eat shellfish for religious reasons        Medication List     TAKE these medications    albuterol 108 (90 Base) MCG/ACT inhaler Commonly known as: VENTOLIN HFA Inhale 2 puffs into the lungs every 6 (six) hours as needed for wheezing or shortness of breath.   ibuprofen 800 MG tablet Commonly known as: ADVIL Take 1 tablet (800 mg total) by mouth every 8 (eight) hours as needed.   losartan 50 MG tablet Commonly known as: COZAAR Take 1 tablet (50 mg total) by mouth daily.   montelukast 10 MG tablet Commonly known as: Singulair Take 1 tablet (10 mg total) by mouth at bedtime. What changed:  when to take this reasons to take this   Oxycodone HCl 10 MG Tabs Take 1 tablet (10 mg total) by mouth every 4 (four) hours as needed (pain).   Vitamin D (Ergocalciferol) 1.25 MG (50000 UNIT) Caps capsule Commonly known as: DRISDOL Take 1 capsule (50,000 Units total) by mouth every 7 (seven) days.        The results of significant diagnostics  from this hospitalization (including imaging, microbiology, ancillary and laboratory) are listed below for reference.    Significant Diagnostic Studies: DG Chest Port 1 View  Result Date: 09/26/2023 CLINICAL DATA:  Shortness of breath. EXAM: PORTABLE CHEST 1 VIEW COMPARISON:  08/23/2023 FINDINGS: Lordotic rotated film. Cardiopericardial silhouette is at upper limits of normal for size. The lungs are clear without focal pneumonia, edema, pneumothorax or pleural effusion. Chronic bony changes consistent with the reported history of sickle cell disease. Avascular necrosis in the humeral heads not excluded. IMPRESSION: 1. No acute cardiopulmonary findings. Electronically Signed   By: Kennith Center M.D.   On: 09/26/2023 05:16   CT Angio Chest PE W and/or Wo Contrast  Result Date: 09/12/2023 CLINICAL DATA:  Positive D-dimer and chest pain, history of sickle cell, initial encounter EXAM: CT ANGIOGRAPHY  CHEST WITH CONTRAST TECHNIQUE: Multidetector CT imaging of the chest was performed using the standard protocol during bolus administration of intravenous contrast. Multiplanar CT image reconstructions and MIPs were obtained to evaluate the vascular anatomy. RADIATION DOSE REDUCTION: This exam was performed according to the departmental dose-optimization program which includes automated exposure control, adjustment of the mA and/or kV according to patient size and/or use of iterative reconstruction technique. CONTRAST:  80mL OMNIPAQUE IOHEXOL 350 MG/ML SOLN COMPARISON:  Chest x-ray from 08/23/2023 FINDINGS: Cardiovascular: Thoracic aorta is well visualized and within normal limits. Heart is at the upper limits of normal in size. The pulmonary artery is well visualized within normal branching pattern. No intraluminal filling defect to suggest pulmonary embolism is noted. Mediastinum/Nodes: Thoracic inlet is within normal limits. No hilar or mediastinal adenopathy is noted. The esophagus as visualized is within normal  limits. Lungs/Pleura: Lungs are well aerated bilaterally. Minimal dependent atelectatic changes are seen. No focal pulmonary nodule or infiltrate is noted. Upper Abdomen: Visualized upper abdomen is within normal limits. Musculoskeletal: Bony structures demonstrate some generalized increased sclerosis consistent with the underlying given clinical history of sickle cell disease. Endplate concavity is again noted Review of the MIP images confirms the above findings. IMPRESSION: No evidence of pulmonary emboli. Changes consistent with the given clinical history of sickle cell disease. No other focal abnormality is noted. Electronically Signed   By: Alcide Clever M.D.   On: 09/12/2023 17:39    Microbiology: No results found for this or any previous visit (from the past 240 hour(s)).   Labs: Basic Metabolic Panel: Recent Labs  Lab 09/26/23 0342 09/28/23 1040  NA 139 138  K 4.9 4.6  CL 103 103  CO2 26 25  GLUCOSE 97 86  BUN 12 9  CREATININE 0.48* 0.43*  CALCIUM 9.3 8.9   Liver Function Tests: Recent Labs  Lab 09/26/23 0342 09/28/23 1040  AST 65* 34  ALT 34 22  ALKPHOS 68 56  BILITOT 3.7* 2.4*  PROT 7.3 6.5  ALBUMIN 4.5 3.7   No results for input(s): "LIPASE", "AMYLASE" in the last 168 hours. No results for input(s): "AMMONIA" in the last 168 hours. CBC: Recent Labs  Lab 09/26/23 0342 09/27/23 0447 09/29/23 0529 10/01/23 0535  WBC 16.3* 12.7* 12.3* 12.8*  NEUTROABS 6.9  --  7.2  --   HGB 9.1* 8.7* 9.7* 8.3*  HCT 25.8* 24.1* 27.3* 23.4*  MCV 83.8 82.0 84.0 83.9  PLT 387 323 341 332   Cardiac Enzymes: No results for input(s): "CKTOTAL", "CKMB", "CKMBINDEX", "TROPONINI" in the last 168 hours. BNP: Invalid input(s): "POCBNP" CBG: No results for input(s): "GLUCAP" in the last 168 hours.  Time coordinating discharge: 50 minutes  Signed:  Nolon Nations  APRN, MSN, FNP-C Patient Care [facility] [address] [place], [place]  [address] [phone]  [facility] 10/01/2023, 10:23 AM

## 2023-10-01 NOTE — Plan of Care (Signed)
Pt given discharge instructions... stated that he understood instructions. Pts vitals stable.  Problem: Education: Goal: Knowledge of vaso-occlusive preventative measures will improve Outcome: Adequate for Discharge Goal: Awareness of infection prevention will improve Outcome: Adequate for Discharge Goal: Awareness of signs and symptoms of anemia will improve Outcome: Adequate for Discharge Goal: Long-term complications will improve Outcome: Adequate for Discharge   Problem: Self-Care: Goal: Ability to incorporate actions that prevent/reduce pain crisis will improve Outcome: Adequate for Discharge   Problem: Bowel/Gastric: Goal: Gut motility will be maintained Outcome: Adequate for Discharge   Problem: Tissue Perfusion: Goal: Complications related to inadequate tissue perfusion will be avoided or minimized Outcome: Adequate for Discharge   Problem: Respiratory: Goal: Pulmonary complications will be avoided or minimized Outcome: Adequate for Discharge Goal: Acute Chest Syndrome will be identified early to prevent complications Outcome: Adequate for Discharge   Problem: Fluid Volume: Goal: Ability to maintain a balanced intake and output will improve Outcome: Adequate for Discharge   Problem: Sensory: Goal: Pain level will decrease with appropriate interventions Outcome: Adequate for Discharge   Problem: Health Behavior: Goal: Postive changes in compliance with treatment and prescription regimens will improve Outcome: Adequate for Discharge   Problem: Education: Goal: Knowledge of General Education information will improve Description: Including pain rating scale, medication(s)/side effects and non-pharmacologic comfort measures Outcome: Adequate for Discharge   Problem: Health Behavior/Discharge Planning: Goal: Ability to manage health-related needs will improve Outcome: Adequate for Discharge   Problem: Clinical Measurements: Goal: Ability to maintain clinical  measurements within normal limits will improve Outcome: Adequate for Discharge Goal: Will remain free from infection Outcome: Adequate for Discharge Goal: Diagnostic test results will improve Outcome: Adequate for Discharge Goal: Respiratory complications will improve Outcome: Adequate for Discharge Goal: Cardiovascular complication will be avoided Outcome: Adequate for Discharge   Problem: Activity: Goal: Risk for activity intolerance will decrease Outcome: Adequate for Discharge   Problem: Nutrition: Goal: Adequate nutrition will be maintained Outcome: Adequate for Discharge   Problem: Coping: Goal: Level of anxiety will decrease Outcome: Adequate for Discharge   Problem: Elimination: Goal: Will not experience complications related to bowel motility Outcome: Adequate for Discharge Goal: Will not experience complications related to urinary retention Outcome: Adequate for Discharge   Problem: Pain Management: Goal: General experience of comfort will improve Outcome: Adequate for Discharge   Problem: Safety: Goal: Ability to remain free from injury will improve Outcome: Adequate for Discharge   Problem: Skin Integrity: Goal: Risk for impaired skin integrity will decrease Outcome: Adequate for Discharge

## 2023-10-03 ENCOUNTER — Telehealth: Payer: Self-pay

## 2023-10-03 NOTE — Transitions of Care (Post Inpatient/ED Visit) (Signed)
10/03/2023  Name: [name] Hill MRN: [id number] DOB: 2001-09-20  Today's TOC FU Call Status: Today's TOC FU Call Status:: Successful TOC FU Call Completed TOC FU Call Complete Date: 10/03/23 Patient's Name and Date of Birth confirmed.  Transition Care Management Follow-up Telephone Call Date of Discharge: 10/02/23 Discharge Facility: [facility]) Type of Discharge: Inpatient Admission Primary Inpatient Discharge Diagnosis:: SCC How have you been since you were released from the hospital?: Better Any questions or concerns?: No  Items Reviewed: Did you receive and understand the discharge instructions provided?: Yes Medications obtained,verified, and reconciled?: Yes (Medications Reviewed) Any new allergies since your discharge?: No Dietary orders reviewed?: NA Do you have support at home?: No  Medications Reviewed Today: Medications Reviewed Today     Reviewed by Redge Gainer, RN (Case Manager) on 10/03/23 at 1603  Med List Status: <None>   Medication Order Taking? Sig Documenting Provider Last Dose Status Informant  albuterol (VENTOLIN HFA) 108 (90 Base) MCG/ACT inhaler [id number] No Inhale 2 puffs into the lungs every 6 (six) hours as needed for wheezing or shortness of breath. Massie Maroon, FNP unk Active Self, Pharmacy Records           Med Note (CRUTHIS, CHLOE C   Tue Mar 18, 2023 11:41 AM)    ibuprofen (ADVIL) 800 MG tablet [id number] No Take 1 tablet (800 mg total) by mouth every 8 (eight) hours as needed. Massie Maroon, FNP 09/26/2023 Active Self, Pharmacy Records           Med Note Deloria Lair, DUROJAHYE' R   Fri Sep 26, 2023  5:07 AM)    losartan (COZAAR) 50 MG tablet [id number] No Take 1 tablet (50 mg total) by mouth daily. Massie Maroon, FNP 09/25/2023 Active Self, Pharmacy Records           Med Note Deloria Lair, DUROJAHYE' R   Fri Sep 26, 2023  5:10 AM) Dispense history does not match pt claims  montelukast (SINGULAIR) 10 MG tablet [id number] No Take  1 tablet (10 mg total) by mouth at bedtime.  Patient taking differently: Take 10 mg by mouth at bedtime as needed.   Ivonne Andrew, NP unk Active Self, Pharmacy Records  Oxycodone HCl 10 MG TABS [id number] No Take 1 tablet (10 mg total) by mouth every 4 (four) hours as needed (pain). Massie Maroon, FNP 09/26/2023 Active Self, Pharmacy Records  Vitamin D, Ergocalciferol, (DRISDOL) 1.25 MG (50000 UNIT) CAPS capsule [id number] No Take 1 capsule (50,000 Units total) by mouth every 7 (seven) days. Donell Beers, FNP 09/19/2023 Active Self, Pharmacy Records           Med Note [place], DUROJAHYE' R   Fri Sep 26, 2023  5:09 AM) Dispense history does not match pt claims            Home Care and Equipment/Supplies: Were Home Health Services Ordered?: NA Any new equipment or medical supplies ordered?: NA  Functional Questionnaire: Do you need assistance with bathing/showering or dressing?: No Do you need assistance with meal preparation?: No Do you need assistance with eating?: No Do you have difficulty maintaining continence: No Do you need assistance with getting out of bed/getting out of a chair/moving?: No Do you have difficulty managing or taking your medications?: No  Follow up appointments reviewed: PCP Follow-up appointment confirmed?: Yes Date of PCP follow-up appointment?: 10/07/23 Follow-up Provider: [facility] Follow-up appointment confirmed?: NA Do you need transportation to your follow-up appointment?: No Do you  understand care options if your condition(s) worsen?: Yes-patient verbalized understanding  SDOH Interventions Today    Flowsheet Row Most Recent Value  SDOH Interventions   Food Insecurity Interventions Intervention Not Indicated  Transportation Interventions Intervention Not Indicated      PCP appointment scheduled. Declines follow up.  Deidre Ala, RN Medical illustrator [facility] [phone]

## 2023-10-07 ENCOUNTER — Encounter: Payer: Self-pay | Admitting: Nurse Practitioner

## 2023-10-07 ENCOUNTER — Ambulatory Visit (INDEPENDENT_AMBULATORY_CARE_PROVIDER_SITE_OTHER): Payer: Medicaid Other | Admitting: Nurse Practitioner

## 2023-10-07 VITALS — BP 129/68 | HR 94 | Wt 183.8 lb

## 2023-10-07 DIAGNOSIS — D571 Sickle-cell disease without crisis: Secondary | ICD-10-CM | POA: Diagnosis not present

## 2023-10-07 DIAGNOSIS — Z09 Encounter for follow-up examination after completed treatment for conditions other than malignant neoplasm: Secondary | ICD-10-CM | POA: Diagnosis not present

## 2023-10-07 DIAGNOSIS — R809 Proteinuria, unspecified: Secondary | ICD-10-CM

## 2023-10-07 DIAGNOSIS — R0602 Shortness of breath: Secondary | ICD-10-CM

## 2023-10-07 DIAGNOSIS — I10 Essential (primary) hypertension: Secondary | ICD-10-CM | POA: Insufficient documentation

## 2023-10-07 MED ORDER — LOSARTAN POTASSIUM 50 MG PO TABS: 50.0000 mg | ORAL_TABLET | Freq: Every day | ORAL | 1 refills | Status: DC

## 2023-10-07 MED ORDER — IBUPROFEN 800 MG PO TABS: 800.0000 mg | ORAL_TABLET | Freq: Three times a day (TID) | ORAL | 3 refills | Status: DC | PRN

## 2023-10-07 NOTE — Assessment & Plan Note (Signed)
Continue losartan 50 mg daily.  

## 2023-10-07 NOTE — Assessment & Plan Note (Addendum)
We discussed the need for good hydration, avoidance of heat cold stress in order triggers of infection.  Patient encouraged to seek medical attention immediately if any symptoms of infection, bleeding Continue ibuprofen 800 mg every 8 hours as needed for mild pain oxycodone 10 mg every 4 hours as needed for moderate pain Need to keep oxycodone and is secured tablet and take medication only as ordered discussed Not on folic acid will encourage patient to start taking folic acid 1 mg daily  - Sickle Cell Panel - [id number] 11+Oxyco+Alc+Crt-Bund - ibuprofen (ADVIL) 800 MG tablet; Take 1 tablet (800 mg total) by mouth every 8 (eight) hours as needed.  Dispense: 30 tablet; Refill: 3
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
## 2023-10-07 NOTE — Assessment & Plan Note (Signed)
BP Readings from Last 3 Encounters:  10/07/23 129/68  10/01/23 133/73  09/12/23 (!) 123/52   HTN Controlled .  On losartan 50 mg daily Continue current medications. No changes in management. Discussed DASH diet and dietary sodium restrictions Medication refilled

## 2023-10-07 NOTE — Assessment & Plan Note (Signed)
Hospital chart reviewed, including discharge summary Medications reconciled and reviewed with the patient in detail 

## 2023-10-07 NOTE — Assessment & Plan Note (Signed)
Still gets shortness of breath with certain activity Will refer patient to pulmonology if symptoms persist Takes albuterol inhaler 2 puffs every 6 hours as needed

## 2023-10-07 NOTE — Patient Instructions (Signed)
1. 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(HCC)  - Sickle Cell Panel - [id number] 11+Oxyco+Alc+Crt-Bund    It is important that you exercise regularly at least 30 minutes 5 times a week as tolerated  Think about what you will eat, plan ahead. Choose " clean, green, fresh or frozen" over canned, processed or packaged foods which are more sugary, salty and fatty. 70 to 75% of food eaten should be vegetables and fruit. Three meals at set times with snacks allowed between meals, but they must be fruit or vegetables. Aim to eat over a 12 hour period , example 7 am to 7 pm, and STOP after  your last meal of the day. Drink water,generally about 64 ounces per day, no other drink is as healthy. Fruit juice is best enjoyed in a healthy way, by EATING the fruit.  Thanks for choosing [facility] we consider it a privelige to serve you.

## 2023-10-08 ENCOUNTER — Other Ambulatory Visit: Payer: Self-pay | Admitting: Nurse Practitioner

## 2023-10-08 DIAGNOSIS — D571 Sickle-cell disease without crisis: Secondary | ICD-10-CM

## 2023-10-08 DIAGNOSIS — E559 Vitamin D deficiency, unspecified: Secondary | ICD-10-CM

## 2023-10-08 LAB — CMP14+CBC/D/PLT+FER+RETIC+V...
ALT: 18 [IU]/L (ref 0–44)
AST: 28 [IU]/L (ref 0–40)
Albumin: 4.1 g/dL — ABNORMAL LOW (ref 4.3–5.2)
Alkaline Phosphatase: 94 [IU]/L (ref 44–121)
BUN/Creatinine Ratio: 16 (ref 9–20)
BUN: 11 mg/dL (ref 6–20)
Basophils Absolute: 0.2 10*3/uL (ref 0.0–0.2)
Basos: 2 %
Bilirubin Total: 1.9 mg/dL — ABNORMAL HIGH (ref 0.0–1.2)
CO2: 20 mmol/L (ref 20–29)
Calcium: 9.5 mg/dL (ref 8.7–10.2)
Chloride: 103 mmol/L (ref 96–106)
Creatinine, Ser: 0.68 mg/dL — ABNORMAL LOW (ref 0.76–1.27)
EOS (ABSOLUTE): 0.4 10*3/uL (ref 0.0–0.4)
Eos: 5 %
Ferritin: 826 ng/mL — ABNORMAL HIGH (ref 30–400)
Globulin, Total: 2.8 g/dL (ref 1.5–4.5)
Glucose: 79 mg/dL (ref 70–99)
Hematocrit: 26.6 % — ABNORMAL LOW (ref 37.5–51.0)
Hemoglobin: 8.7 g/dL — ABNORMAL LOW (ref 13.0–17.7)
Immature Grans (Abs): 0.1 10*3/uL (ref 0.0–0.1)
Immature Granulocytes: 1 %
Lymphocytes Absolute: 3.6 10*3/uL — ABNORMAL HIGH (ref 0.7–3.1)
Lymphs: 38 %
MCH: 28.9 pg (ref 26.6–33.0)
MCHC: 32.7 g/dL (ref 31.5–35.7)
MCV: 88 fL (ref 79–97)
Monocytes Absolute: 1.2 10*3/uL — ABNORMAL HIGH (ref 0.1–0.9)
Monocytes: 12 %
NRBC: 2 % — ABNORMAL HIGH (ref 0–0)
Neutrophils Absolute: 4 10*3/uL (ref 1.4–7.0)
Neutrophils: 42 %
Platelets: 763 10*3/uL — ABNORMAL HIGH (ref 150–450)
Potassium: 5 mmol/L (ref 3.5–5.2)
RBC: 3.01 x10E6/uL — ABNORMAL LOW (ref 4.14–5.80)
RDW: 21.1 % — ABNORMAL HIGH (ref 11.6–15.4)
Retic Ct Pct: 10.9 % — ABNORMAL HIGH (ref 0.6–2.6)
Sodium: 139 mmol/L (ref 134–144)
Total Protein: 6.9 g/dL (ref 6.0–8.5)
Vit D, 25-Hydroxy: 10.3 ng/mL — ABNORMAL LOW (ref 30.0–100.0)
WBC: 9.5 10*3/uL (ref 3.4–10.8)
eGFR: 133 mL/min/{1.73_m2} (ref >59)

## 2023-10-08 MED ORDER — VITAMIN D (ERGOCALCIFEROL) 1.25 MG (50000 UNIT) PO CAPS: 50000.0000 [IU] | ORAL_CAPSULE | ORAL | 1 refills | Status: DC

## 2023-10-08 MED ORDER — FOLIC ACID 1 MG PO TABS: 1.0000 mg | ORAL_TABLET | Freq: Every day | ORAL | 1 refills | Status: DC

## 2023-10-12 ENCOUNTER — Emergency Department (HOSPITAL_BASED_OUTPATIENT_CLINIC_OR_DEPARTMENT_OTHER): Payer: Medicaid Other

## 2023-10-12 ENCOUNTER — Other Ambulatory Visit: Payer: Self-pay

## 2023-10-12 ENCOUNTER — Encounter (HOSPITAL_BASED_OUTPATIENT_CLINIC_OR_DEPARTMENT_OTHER): Payer: Self-pay | Admitting: Emergency Medicine

## 2023-10-12 ENCOUNTER — Emergency Department (HOSPITAL_BASED_OUTPATIENT_CLINIC_OR_DEPARTMENT_OTHER)
Admission: EM | Admit: 2023-10-12 | Discharge: 2023-10-13 | Disposition: A | Discharge status: Home / Self Care | Payer: Medicaid Other | Attending: Emergency Medicine | Admitting: Emergency Medicine

## 2023-10-12 DIAGNOSIS — D5700 Hb-SS disease with crisis, unspecified: Secondary | ICD-10-CM

## 2023-10-12 DIAGNOSIS — D57219 Sickle-cell/Hb-C disease with crisis, unspecified: Secondary | ICD-10-CM | POA: Diagnosis not present

## 2023-10-12 DIAGNOSIS — M545 Low back pain, unspecified: Secondary | ICD-10-CM | POA: Diagnosis present

## 2023-10-12 LAB — BASIC METABOLIC PANEL
Anion gap: 8 (ref 5–15)
BUN: 9 mg/dL (ref 6–20)
CO2: 24 mmol/L (ref 22–32)
Calcium: 8.8 mg/dL — ABNORMAL LOW (ref 8.9–10.3)
Chloride: 106 mmol/L (ref 98–111)
Creatinine, Ser: 0.7 mg/dL (ref 0.61–1.24)
GFR, Estimated: >60 mL/min (ref >60)
Glucose, Bld: 111 mg/dL — ABNORMAL HIGH (ref 70–99)
Potassium: 3.7 mmol/L (ref 3.5–5.1)
Sodium: 138 mmol/L (ref 135–145)

## 2023-10-12 LAB — CBC WITH DIFFERENTIAL/PLATELET
Abs Immature Granulocytes: 0.03 10*3/uL (ref 0.00–0.07)
Basophils Absolute: 0.2 10*3/uL — ABNORMAL HIGH (ref 0.0–0.1)
Basophils Relative: 1 %
Eosinophils Absolute: 0.5 10*3/uL (ref 0.0–0.5)
Eosinophils Relative: 4 %
HCT: 25.3 % — ABNORMAL LOW (ref 39.0–52.0)
Hemoglobin: 9 g/dL — ABNORMAL LOW (ref 13.0–17.0)
Immature Granulocytes: 0 %
Lymphocytes Relative: 44 %
Lymphs Abs: 6.1 10*3/uL — ABNORMAL HIGH (ref 0.7–4.0)
MCH: 29.5 pg (ref 26.0–34.0)
MCHC: 35.6 g/dL (ref 30.0–36.0)
MCV: 83 fL (ref 80.0–100.0)
Monocytes Absolute: 1.2 10*3/uL — ABNORMAL HIGH (ref 0.1–1.0)
Monocytes Relative: 9 %
Neutro Abs: 5.7 10*3/uL (ref 1.7–7.7)
Neutrophils Relative %: 42 %
Platelets: 627 10*3/uL — ABNORMAL HIGH (ref 150–400)
RBC: 3.05 MIL/uL — ABNORMAL LOW (ref 4.22–5.81)
RDW: 20.4 % — ABNORMAL HIGH (ref 11.5–15.5)
Smear Review: NORMAL
WBC: 13.7 10*3/uL — ABNORMAL HIGH (ref 4.0–10.5)
nRBC: 0.6 % — ABNORMAL HIGH (ref 0.0–0.2)

## 2023-10-12 MED ORDER — DIPHENHYDRAMINE HCL 50 MG/ML IJ SOLN
INTRAMUSCULAR | Status: AC
  Administered 2023-10-12: 12.5 mg
  Filled 2023-10-12: qty 1

## 2023-10-12 MED ORDER — ONDANSETRON HCL 4 MG/2ML IJ SOLN
4.0000 mg | INTRAMUSCULAR | Status: DC | PRN
  Administered 2023-10-12: 4 mg via INTRAVENOUS
  Filled 2023-10-12: qty 2

## 2023-10-12 MED ORDER — HYDROMORPHONE HCL 1 MG/ML IJ SOLN
2.0000 mg | INTRAMUSCULAR | Status: AC
Start: 2023-10-12 — End: 2023-10-13
  Administered 2023-10-13: 2 mg via INTRAVENOUS
  Filled 2023-10-12: qty 2

## 2023-10-12 MED ORDER — HYDROMORPHONE HCL 1 MG/ML IJ SOLN
2.0000 mg | INTRAMUSCULAR | Status: AC
Start: 2023-10-12 — End: 2023-10-12
  Administered 2023-10-12: 2 mg via INTRAVENOUS
  Filled 2023-10-12: qty 2

## 2023-10-12 MED ORDER — OXYCODONE HCL 5 MG PO TABS
15.0000 mg | ORAL_TABLET | Freq: Once | ORAL | Status: DC
Start: 2023-10-13 — End: 2023-10-13
  Filled 2023-10-12: qty 3

## 2023-10-12 MED ORDER — KETOROLAC TROMETHAMINE 15 MG/ML IJ SOLN
15.0000 mg | INTRAMUSCULAR | Status: AC
  Administered 2023-10-12: 15 mg via INTRAVENOUS
  Filled 2023-10-12: qty 1

## 2023-10-12 MED ORDER — SODIUM CHLORIDE 0.9 % IV SOLN
12.5000 mg | Freq: Once | INTRAVENOUS | Status: AC
  Administered 2023-10-12: 12.5 mg via INTRAVENOUS
  Filled 2023-10-12: qty 0.25

## 2023-10-12 NOTE — ED Provider Notes (Incomplete)
  Provider Note MRN:  [id number]  Arrival date & time: 10/13/23    ED Course and Medical Decision Making  Assumed care from Dr. Karene Fry at shift change.  [facility] will reassess after pain control.  12 AM update: On reassessment patient is feeling a lot better, feels well enough to go home.  Looks well, reassuring vital signs, workup without acute concerns.  Question infiltrate on x-ray however this does not correlate clinically, no cough, no fever.  Highly doubt pneumonia or acute chest or other pulmonary pathology.  Favoring atelectasis.  Procedures  Final Clinical Impressions(s) / ED Diagnoses     ICD-10-CM   1. Sickle cell anemia with pain (HCC)  D57.00       ED Discharge Orders     None         Discharge Instructions      You were evaluated in the Emergency Department and after careful evaluation, we did not find any emergent condition requiring admission or further testing in the hospital.  Your exam/testing today is overall reassuring.  Recommend follow-up with your hematologist.  Please return to the Emergency Department if you experience any worsening of your condition.   Thank you for allowing [place] to be a part of your care.      Elmer Sow. Pilar Plate, MD [facility] Emergency Medicine [facility] [email or web address]    Sabas Sous, MD 10/13/23 0015    Sabas Sous, MD 10/13/23 [phone]

## 2023-10-12 NOTE — ED Provider Notes (Signed)
[place] [facility] DEPARTMENT AT [facility] Provider Note   CSN: [id number] Arrival date & time: 10/12/23  2124     History  Chief Complaint  Patient presents with   Sickle Cell Pain Crisis    [name] Hill is a 22 y.o. male.   Sickle Cell Pain Crisis    22 year old male with medical history significant for sickle cell disease presenting to the emergency department with bilateral arm and leg pain, low back pain.  The patient states that he had previously had to be admitted for sickle cell pain crisis earlier in the month but states that this pain is not as severe.  He denies any chest pain or shortness of breath, no fevers, chills, cough, abdominal pain or other infectious concerns.  He has been taking his home ibuprofen and oxycodone without relief.  Symptoms started over the past day.  Home Medications Prior to Admission medications   Medication Sig Start Date End Date Taking? Authorizing Provider  albuterol (VENTOLIN HFA) 108 (90 Base) MCG/ACT inhaler Inhale 2 puffs into the lungs every 6 (six) hours as needed for wheezing or shortness of breath. 10/08/22   Massie Maroon, FNP  folic acid (FOLVITE) 1 MG tablet Take 1 tablet (1 mg total) by mouth daily. 10/08/23   Donell Beers, FNP  ibuprofen (ADVIL) 800 MG tablet Take 1 tablet (800 mg total) by mouth every 8 (eight) hours as needed. 10/07/23   Paseda, Baird Kay, FNP  losartan (COZAAR) 50 MG tablet Take 1 tablet (50 mg total) by mouth daily. 10/07/23   Paseda, Baird Kay, FNP  montelukast (SINGULAIR) 10 MG tablet Take 1 tablet (10 mg total) by mouth at bedtime. Patient taking differently: Take 10 mg by mouth at bedtime as needed. 09/11/23 09/10/24  Ivonne Andrew, NP  Oxycodone HCl 10 MG TABS Take 1 tablet (10 mg total) by mouth every 4 (four) hours as needed (pain). 08/28/23   Massie Maroon, FNP  Vitamin D, Ergocalciferol, (DRISDOL) 1.25 MG (50000 UNIT) CAPS capsule Take 1 capsule (50,000 Units  total) by mouth every 7 (seven) days. 10/08/23   Donell Beers, FNP      Allergies    Lisinopril, Pork-derived products, and Shellfish allergy    Review of Systems   Review of Systems  All other systems reviewed and are negative.   Physical Exam Updated Vital Signs BP 133/81 (BP Location: Right Arm)   Pulse 94   Temp 97.9 F (36.6 C)   Resp 18   Ht 5\' 7"  (1.702 m)   Wt 83.4 kg   SpO2 95%   BMI 28.80 kg/m  Physical Exam Vitals and nursing note reviewed.  Constitutional:      General: He is not in acute distress.    Appearance: He is well-developed.  HENT:     Head: Normocephalic and atraumatic.  Eyes:     Conjunctiva/sclera: Conjunctivae normal.  Cardiovascular:     Rate and Rhythm: Normal rate and regular rhythm.  Pulmonary:     Effort: Pulmonary effort is normal. No respiratory distress.     Breath sounds: Normal breath sounds.  Abdominal:     Palpations: Abdomen is soft.     Tenderness: There is no abdominal tenderness.  Musculoskeletal:        General: No swelling.     Cervical back: Neck supple.  Skin:    General: Skin is warm and dry.     Capillary Refill: Capillary refill takes less than  2 seconds.  Neurological:     Mental Status: He is alert.  Psychiatric:        Mood and Affect: Mood normal.     ED Results / Procedures / Treatments   Labs (all labs ordered are listed, but only abnormal results are displayed) Labs Reviewed  CBC WITH DIFFERENTIAL/PLATELET - Abnormal; Notable for the following components:      Result Value   WBC 13.7 (*)    RBC 3.05 (*)    Hemoglobin 9.0 (*)    HCT 25.3 (*)    RDW 20.4 (*)    Platelets 627 (*)    nRBC 0.6 (*)    All other components within normal limits  BASIC METABOLIC PANEL - Abnormal; Notable for the following components:   Glucose, Bld 111 (*)    Calcium 8.8 (*)    All other components within normal limits  RETICULOCYTES    EKG EKG Interpretation Date/Time:  Sunday October 12 2023 22:18:15  EST Ventricular Rate:  82 PR Interval:  205 QRS Duration:  97 QT Interval:  368 QTC Calculation: 430 R Axis:   77  Text Interpretation: Sinus rhythm Borderline prolonged PR interval Borderline ST elevation, lateral leads No significant change since last tracing Confirmed by Saffron Busey ([id number]) on 10/12/2023 10:27:27 PM  Radiology DG Chest Portable 1 View  Result Date: 10/12/2023 CLINICAL DATA:  Back pain, sickle cell disease EXAM: PORTABLE CHEST 1 VIEW COMPARISON:  None Available. FINDINGS: Heart is borderline in size. Mediastinal contours are within normal limits. Increased markings at the right lung base. Left lung clear. No effusions or edema. No acute bony abnormality. IMPRESSION: Crease markings at the right lung base could reflect atelectasis or early infiltrate. Electronically Signed   By: Kevin  Dover M.D.   On: 10/12/2023 22:19    Procedures Procedures    Medications Ordered in ED Medications  HYDROmorphone (DILAUDID) injection 2 mg (has no administration in time range)  HYDROmorphone (DILAUDID) injection 2 mg (has no administration in time range)  oxyCODONE (Oxy IR/ROXICODONE) immediate release tablet 15 mg (has no administration in time range)  diphenhydrAMINE (BENADRYL) 12.5 mg in sodium chloride 0.9 % 50 mL IVPB (12.5 mg Intravenous New Bag/Given 10/12/23 2302)  ondansetron (ZOFRAN) injection 4 mg (4 mg Intravenous Given 10/12/23 2251)  ketorolac (TORADOL) 15 MG/ML injection 15 mg (15 mg Intravenous Given 10/12/23 2252)  HYDROmorphone (DILAUDID) injection 2 mg (2 mg Intravenous Given 10/12/23 2252)  diphenhydrAMINE (BENADRYL) 50 MG/ML injection (12.5 mg  Given 10/12/23 2252)    ED Course/ Medical Decision Making/ A&P                                 Medical Decision Making Amount and/or Complexity of Data Reviewed Labs: ordered. Radiology: ordered.  Risk Prescription drug management.     22  year old male with medical history significant for sickle cell  disease presenting to the emergency department with bilateral arm and leg pain, low back pain.  The patient states that he had previously had to be admitted for sickle cell pain crisis earlier in the month but states that this pain is not as severe.  He denies any chest pain or shortness of breath, no fevers, chills, cough, abdominal pain or other infectious concerns.  He has been taking his home ibuprofen and oxycodone without relief.  Symptoms started over the past day.  [name] Hill is a 22 y.o. male who presents with  acute pain as per above.  Currently he arrives vitally stable, afebrile, not tachycardic or tachypneic, hemodynamically stable, saturating well on room air.  He is awake, alert, hemodynamically stable, and afebrile. His exam is most notable for no tachypnea, no focal pulmonary findings, no difficultly with ambulation, normal capillary refill.  His vaccines are UTD.  Due to the patient's presentation, I am most concerned for sickle cell pain crisis.  To further evaluate and risk stratify him, labs and imaging were obtained, which were significant for:  Labs: CBC with a nonspecific leukocytosis of 13.7 which appears to be chronic, hemoglobin at baseline at 9.0, BMP generally unremarkable.  Reticulocyte count pending. Imaging:  IMPRESSION:  Crease markings at the right lung base could reflect atelectasis or  early infiltrate.   ECG: Sinus rhythm, ventricular rate 82, borderline prolonged PR interval, PR 205, borderline ST elevation similar to prior EKGs.   I do not think he has or is experiencing an acute stroke, sequestration crisis, aplastic anemia, Acute Chest Syndrome, avascular necrosis, septic arthritis, hyphema, or sepsis.  While in the ED, the patient received: Medications  HYDROmorphone (DILAUDID) injection 2 mg (has no administration in time range)  HYDROmorphone (DILAUDID) injection 2 mg (has no administration in time range)  oxyCODONE (Oxy IR/ROXICODONE) immediate  release tablet 15 mg (has no administration in time range)  diphenhydrAMINE (BENADRYL) 12.5 mg in sodium chloride 0.9 % 50 mL IVPB (12.5 mg Intravenous New Bag/Given 10/12/23 2302)  ondansetron (ZOFRAN) injection 4 mg (4 mg Intravenous Given 10/12/23 2251)  ketorolac (TORADOL) 15 MG/ML injection 15 mg (15 mg Intravenous Given 10/12/23 2252)  HYDROmorphone (DILAUDID) injection 2 mg (2 mg Intravenous Given 10/12/23 2252)  diphenhydrAMINE (BENADRYL) 50 MG/ML injection (12.5 mg  Given 10/12/23 2252)    Plan for repeat assessment, ultimate determination for admission pending reassessment following administration of IV Dilaudid. Signout given to Dr. Pilar Plate at 2330.   Final Clinical Impression(s) / ED Diagnoses Final diagnoses:  Sickle cell anemia with pain [facility])    Rx / DC Orders ED Discharge Orders     None         Ernie Avena, MD 10/12/23 2310

## 2023-10-12 NOTE — ED Triage Notes (Signed)
Pt c/o pain to BUE, BLE and back all day, worse around 7pm

## 2023-10-13 ENCOUNTER — Other Ambulatory Visit: Payer: Self-pay

## 2023-10-13 DIAGNOSIS — G894 Chronic pain syndrome: Secondary | ICD-10-CM

## 2023-10-13 DIAGNOSIS — D571 Sickle-cell disease without crisis: Secondary | ICD-10-CM

## 2023-10-13 DIAGNOSIS — F1190 Opioid use, unspecified, uncomplicated: Secondary | ICD-10-CM

## 2023-10-13 LAB — RETICULOCYTES
Immature Retic Fract: 45.6 % — ABNORMAL HIGH (ref 2.3–15.9)
RBC.: 3.02 MIL/uL — ABNORMAL LOW (ref 4.22–5.81)
Retic Count, Absolute: 218.8 10*3/uL — ABNORMAL HIGH (ref 19.0–186.0)
Retic Ct Pct: 6.9 % — ABNORMAL HIGH (ref 0.4–3.1)

## 2023-10-13 MED ORDER — OXYCODONE HCL 10 MG PO TABS: 10.0000 mg | ORAL_TABLET | ORAL | 0 refills | Status: DC | PRN

## 2023-10-13 NOTE — Telephone Encounter (Signed)
Please advise Kh

## 2023-10-13 NOTE — Discharge Instructions (Signed)
You were evaluated in the Emergency Department and after careful evaluation, we did not find any emergent condition requiring admission or further testing in the hospital.  Your exam/testing today is overall reassuring.  Recommend follow-up with your hematologist.  Please return to the Emergency Department if you experience any worsening of your condition.   Thank you for allowing [place] to be a part of your care.

## 2023-10-13 NOTE — Telephone Encounter (Signed)
Pt will be added by Nelva Bush. KH

## 2023-10-14 ENCOUNTER — Telehealth (INDEPENDENT_AMBULATORY_CARE_PROVIDER_SITE_OTHER): Payer: Medicaid Other | Admitting: Family Medicine

## 2023-10-14 DIAGNOSIS — D571 Sickle-cell disease without crisis: Secondary | ICD-10-CM | POA: Diagnosis not present

## 2023-10-14 DIAGNOSIS — G894 Chronic pain syndrome: Secondary | ICD-10-CM

## 2023-10-14 LAB — DRUG SCREEN 764883 11+OXYCO+ALC+CRT-BUND
Amphetamines, Urine: NEGATIVE ng/mL
BENZODIAZ UR QL: NEGATIVE ng/mL
Barbiturate: NEGATIVE ng/mL
Cocaine (Metabolite): NEGATIVE ng/mL
Creatinine: 106 mg/dL (ref 20.0–300.0)
Ethanol: NEGATIVE %
Meperidine: NEGATIVE ng/mL
Methadone Screen, Urine: NEGATIVE ng/mL
OPIATE SCREEN URINE: NEGATIVE ng/mL
Phencyclidine: NEGATIVE ng/mL
Propoxyphene: NEGATIVE ng/mL
Tramadol: NEGATIVE ng/mL
pH, Urine: 5.4 (ref 4.5–8.9)

## 2023-10-14 LAB — OXYCODONE/OXYMORPHONE, CONFIRM
OXYCODONE/OXYMORPH: POSITIVE — AB
OXYCODONE: NEGATIVE
OXYMORPHONE (GC/MS): 592 ng/mL
OXYMORPHONE: POSITIVE — AB

## 2023-10-14 LAB — CANNABINOID CONFIRMATION, UR
CANNABINOIDS: POSITIVE — AB
Carboxy THC GC/MS Conf: 42 ng/mL

## 2023-11-04 ENCOUNTER — Other Ambulatory Visit: Payer: Self-pay

## 2023-11-04 ENCOUNTER — Emergency Department (HOSPITAL_COMMUNITY)
Admission: EM | Admit: 2023-11-04 | Discharge: 2023-11-04 | Disposition: A | Discharge status: Home / Self Care | Payer: Medicaid Other | Attending: Emergency Medicine | Admitting: Emergency Medicine

## 2023-11-04 ENCOUNTER — Emergency Department (HOSPITAL_COMMUNITY): Payer: Medicaid Other

## 2023-11-04 ENCOUNTER — Encounter (HOSPITAL_COMMUNITY): Payer: Self-pay

## 2023-11-04 DIAGNOSIS — G894 Chronic pain syndrome: Secondary | ICD-10-CM

## 2023-11-04 DIAGNOSIS — Z79899 Other long term (current) drug therapy: Secondary | ICD-10-CM | POA: Diagnosis not present

## 2023-11-04 DIAGNOSIS — D571 Sickle-cell disease without crisis: Secondary | ICD-10-CM

## 2023-11-04 DIAGNOSIS — D57219 Sickle-cell/Hb-C disease with crisis, unspecified: Secondary | ICD-10-CM | POA: Insufficient documentation

## 2023-11-04 DIAGNOSIS — F1190 Opioid use, unspecified, uncomplicated: Secondary | ICD-10-CM

## 2023-11-04 DIAGNOSIS — D5700 Hb-SS disease with crisis, unspecified: Secondary | ICD-10-CM

## 2023-11-04 DIAGNOSIS — I10 Essential (primary) hypertension: Secondary | ICD-10-CM | POA: Insufficient documentation

## 2023-11-04 LAB — RETICULOCYTES
Immature Retic Fract: 35.3 % — ABNORMAL HIGH (ref 2.3–15.9)
RBC.: 2.75 MIL/uL — ABNORMAL LOW (ref 4.22–5.81)
Retic Count, Absolute: 212 10*3/uL — ABNORMAL HIGH (ref 19.0–186.0)
Retic Ct Pct: 7.4 % — ABNORMAL HIGH (ref 0.4–3.1)

## 2023-11-04 LAB — CBC WITH DIFFERENTIAL/PLATELET
Abs Immature Granulocytes: 0.48 10*3/uL — ABNORMAL HIGH (ref 0.00–0.07)
Basophils Absolute: 0.2 10*3/uL — ABNORMAL HIGH (ref 0.0–0.1)
Basophils Relative: 1 %
Eosinophils Absolute: 0.5 10*3/uL (ref 0.0–0.5)
Eosinophils Relative: 4 %
HCT: 23.5 % — ABNORMAL LOW (ref 39.0–52.0)
Hemoglobin: 8.5 g/dL — ABNORMAL LOW (ref 13.0–17.0)
Immature Granulocytes: 4 %
Lymphocytes Relative: 44 %
Lymphs Abs: 5.4 10*3/uL — ABNORMAL HIGH (ref 0.7–4.0)
MCH: 30.1 pg (ref 26.0–34.0)
MCHC: 36.2 g/dL — ABNORMAL HIGH (ref 30.0–36.0)
MCV: 83.3 fL (ref 80.0–100.0)
Monocytes Absolute: 1.2 10*3/uL — ABNORMAL HIGH (ref 0.1–1.0)
Monocytes Relative: 9 %
Neutro Abs: 4.8 10*3/uL (ref 1.7–7.7)
Neutrophils Relative %: 38 %
Platelets: 388 10*3/uL (ref 150–400)
RBC: 2.82 MIL/uL — ABNORMAL LOW (ref 4.22–5.81)
RDW: 20.3 % — ABNORMAL HIGH (ref 11.5–15.5)
WBC: 12.6 10*3/uL — ABNORMAL HIGH (ref 4.0–10.5)
nRBC: 0.7 % — ABNORMAL HIGH (ref 0.0–0.2)

## 2023-11-04 LAB — COMPREHENSIVE METABOLIC PANEL
ALT: 15 U/L (ref 0–44)
AST: 34 U/L (ref 15–41)
Albumin: 3.9 g/dL (ref 3.5–5.0)
Alkaline Phosphatase: 74 U/L (ref 38–126)
Anion gap: 8 (ref 5–15)
BUN: 10 mg/dL (ref 6–20)
CO2: 26 mmol/L (ref 22–32)
Calcium: 8.6 mg/dL — ABNORMAL LOW (ref 8.9–10.3)
Chloride: 106 mmol/L (ref 98–111)
Creatinine, Ser: 0.51 mg/dL — ABNORMAL LOW (ref 0.61–1.24)
GFR, Estimated: >60 mL/min (ref >60)
Glucose, Bld: 105 mg/dL — ABNORMAL HIGH (ref 70–99)
Potassium: 3.8 mmol/L (ref 3.5–5.1)
Sodium: 140 mmol/L (ref 135–145)
Total Bilirubin: 3.1 mg/dL — ABNORMAL HIGH (ref <1.2)
Total Protein: 6.2 g/dL — ABNORMAL LOW (ref 6.5–8.1)

## 2023-11-04 LAB — TROPONIN I (HIGH SENSITIVITY)
Troponin I (High Sensitivity): 4 ng/L (ref <18)
Troponin I (High Sensitivity): 4 ng/L (ref <18)

## 2023-11-04 MED ORDER — HYDROMORPHONE HCL 2 MG/ML IJ SOLN
2.0000 mg | Freq: Once | INTRAMUSCULAR | Status: AC
  Administered 2023-11-04: 2 mg via INTRAVENOUS
  Filled 2023-11-04: qty 1

## 2023-11-04 MED ORDER — OXYCODONE HCL 10 MG PO TABS: 10.0000 mg | ORAL_TABLET | ORAL | 0 refills | Status: DC | PRN

## 2023-11-04 MED ORDER — KETOROLAC TROMETHAMINE 15 MG/ML IJ SOLN
15.0000 mg | INTRAMUSCULAR | Status: AC
  Administered 2023-11-04: 15 mg via INTRAVENOUS
  Filled 2023-11-04: qty 1

## 2023-11-04 MED ORDER — SODIUM CHLORIDE 0.9 % IV BOLUS
1000.0000 mL | Freq: Once | INTRAVENOUS | Status: AC
Start: 2023-11-04 — End: 2023-11-04
  Administered 2023-11-04: 1000 mL via INTRAVENOUS

## 2023-11-04 NOTE — ED Provider Notes (Signed)
[place] EMERGENCY DEPARTMENT AT [facility] Provider Note   CSN: [id number] Arrival date & time: 11/04/23  0220     History  Chief Complaint  Patient presents with   Sickle Cell Pain Crisis    [name] Hill is a 21 y.o. male with a history of sickle cell anemia and hypertension who presents to the ED today via EMS for sickle cell pain crisis.  Patient reports pain started about 45 minutes prior to arrival and is located in his chest, back, and arms, which is where the pain is typically located for him.  Patient took Oxycodone prior to going to sleep last night and then prior to arrival without relief.  He denies any shortness of breath, cough, or fevers.  No additional complaints or concerns at this time.    Home Medications Prior to Admission medications   Medication Sig Start Date End Date Taking? Authorizing Provider  albuterol (VENTOLIN HFA) 108 (90 Base) MCG/ACT inhaler Inhale 2 puffs into the lungs every 6 (six) hours as needed for wheezing or shortness of breath. 10/08/22  Yes Massie Maroon, FNP  folic acid (FOLVITE) 1 MG tablet Take 1 tablet (1 mg total) by mouth daily. 10/08/23  Yes Paseda, Baird Kay, FNP  ibuprofen (ADVIL) 800 MG tablet Take 1 tablet (800 mg total) by mouth every 8 (eight) hours as needed. 10/07/23  Yes Paseda, Baird Kay, FNP  losartan (COZAAR) 50 MG tablet Take 1 tablet (50 mg total) by mouth daily. 10/07/23  Yes Paseda, Baird Kay, FNP  montelukast (SINGULAIR) 10 MG tablet Take 1 tablet (10 mg total) by mouth at bedtime. Patient taking differently: Take 10 mg by mouth at bedtime as needed. 09/11/23 09/10/24 Yes Ivonne Andrew, NP  Vitamin D, Ergocalciferol, (DRISDOL) 1.25 MG (50000 UNIT) CAPS capsule Take 1 capsule (50,000 Units total) by mouth every 7 (seven) days. 10/08/23  Yes Paseda, Baird Kay, FNP  Oxycodone HCl 10 MG TABS Take 1 tablet (10 mg total) by mouth every 4 (four) hours as needed for up to 7 days (pain). 11/04/23  11/11/23  Maxwell Marion, PA-C      Allergies    Lisinopril, Pork-derived products, and Shellfish allergy    Review of Systems   Review of Systems  Respiratory:  Positive for chest tightness.   All other systems reviewed and are negative.   Physical Exam Updated Vital Signs BP (!) 106/59   Pulse 64   Temp 98 F (36.7 C) (Oral)   Resp 18   Ht 5\' 7"  (1.702 m)   Wt 86.2 kg   SpO2 97%   BMI 29.76 kg/m  Physical Exam Vitals and nursing note reviewed.  Constitutional:      General: He is not in acute distress.    Appearance: Normal appearance.     Comments: Patient is resting comfortably in bed  HENT:     Head: Normocephalic and atraumatic.     Mouth/Throat:     Mouth: Mucous membranes are moist.  Eyes:     Conjunctiva/sclera: Conjunctivae normal.     Pupils: Pupils are equal, round, and reactive to light.  Cardiovascular:     Rate and Rhythm: Normal rate and regular rhythm.     Pulses: Normal pulses.     Heart sounds: Normal heart sounds.  Pulmonary:     Effort: Pulmonary effort is normal.     Breath sounds: Normal breath sounds.  Abdominal:     Palpations: Abdomen is soft.  Tenderness: There is no abdominal tenderness.  Musculoskeletal:        General: Normal range of motion.     Cervical back: Normal range of motion.     Comments: Tenderness to palpation of the chest, upper back, and arms bilaterally.  Strength, range of motion, and sensation intact of upper and lower extremities.   Skin:    General: Skin is warm and dry.     Findings: No rash.  Neurological:     General: No focal deficit present.     Mental Status: He is alert.     Sensory: No sensory deficit.     Motor: No weakness.  Psychiatric:        Mood and Affect: Mood normal.        Behavior: Behavior normal.    ED Results / Procedures / Treatments   Labs (all labs ordered are listed, but only abnormal results are displayed) Labs Reviewed  COMPREHENSIVE METABOLIC PANEL - Abnormal; Notable  for the following components:      Result Value   Glucose, Bld 105 (*)    Creatinine, Ser 0.51 (*)    Calcium 8.6 (*)    Total Protein 6.2 (*)    Total Bilirubin 3.1 (*)    All other components within normal limits  CBC WITH DIFFERENTIAL/PLATELET - Abnormal; Notable for the following components:   WBC 12.6 (*)    RBC 2.82 (*)    Hemoglobin 8.5 (*)    HCT 23.5 (*)    MCHC 36.2 (*)    RDW 20.3 (*)    nRBC 0.7 (*)    Lymphs Abs 5.4 (*)    Monocytes Absolute 1.2 (*)    Basophils Absolute 0.2 (*)    Abs Immature Granulocytes 0.48 (*)    All other components within normal limits  RETICULOCYTES - Abnormal; Notable for the following components:   Retic Ct Pct 7.4 (*)    RBC. 2.75 (*)    Retic Count, Absolute 212.0 (*)    Immature Retic Fract 35.3 (*)    All other components within normal limits  TROPONIN I (HIGH SENSITIVITY)  TROPONIN I (HIGH SENSITIVITY)    EKG EKG Interpretation Date/Time:  Tuesday November 04 2023 02:41:24 EST Ventricular Rate:  79 PR Interval:  221 QRS Duration:  97 QT Interval:  367 QTC Calculation: 421 R Axis:   70  Text Interpretation: Sinus rhythm Prolonged PR interval When compared with ECG of 10/12/2023, No significant change was found Confirmed by Dione Booze ([id number]) on 11/04/2023 7:15:37 AM  Radiology DG Chest Portable 1 View  Result Date: 11/04/2023 CLINICAL DATA:  Chest pain, sickle cell crisis EXAM: PORTABLE CHEST 1 VIEW COMPARISON:  10/12/2023 FINDINGS: Heart and mediastinal contours are within normal limits. No focal opacities or effusions. No acute bony abnormality. IMPRESSION: No active disease. Electronically Signed   By: Charlett Nose M.D.   On: 11/04/2023 02:40    Procedures Procedures: not indicated.   Medications Ordered in ED Medications  HYDROmorphone (DILAUDID) injection 2 mg (2 mg Intravenous Given 11/04/23 0827)  HYDROmorphone (DILAUDID) injection 2 mg (2 mg Intravenous Given 11/04/23 1142)  sodium chloride 0.9 % bolus  1,000 mL (0 mLs Intravenous Stopped 11/04/23 1310)  ketorolac (TORADOL) 15 MG/ML injection 15 mg (15 mg Intravenous Given 11/04/23 1242)    ED Course/ Medical Decision Making/ A&P  Medical Decision Making Amount and/or Complexity of Data Reviewed Labs: ordered. Radiology: ordered.  Risk Prescription drug management.   This patient presents to the ED for concern of sickle cell pain crisis, this involves an extensive number of treatment options, and is a complaint that carries with it a high risk of complications and morbidity.   Differential diagnosis includes: aplastic anemia, acute chest syndrome, sickle cell crisis, etc.   Comorbidities  See HPI above   Additional History  Additional history obtained from prior ED notes.   Cardiac Monitoring / EKG  The patient was maintained on a cardiac monitor.  I personally viewed and interpreted the cardiac monitored which showed: sinus rhythm with a heart rate of 79 bpm.   Lab Tests  I ordered and personally interpreted labs.  The pertinent results include:   CMP and CBC within normal limits per patient Elevated reticulocyte count of 7.4 Negative troponin   Imaging Studies  I ordered imaging studies including CXR  I independently visualized and interpreted imaging which showed: No active disease. I agree with the radiologist interpretation   Problem List / ED Course / Critical Interventions / Medication Management  Sickle cell pain crisis I ordered medications including: Dilaudid x2 for pain Toradol for pain Normal saline Reevaluation of the patient after these medicines showed that the patient resolved I have reviewed the patients home medicines and have made adjustments as needed   Social Determinants of Health  Access to healthcare   Test / Admission - Considered  Discussed findings with patient.  All questions answered. He is hemodynamically stable and safe for discharge  home. Return precautions provided.       Final Clinical Impression(s) / ED Diagnoses Final diagnoses:  Sickle cell crisis [facility])    Rx / DC Orders ED Discharge Orders          Ordered    Oxycodone HCl 10 MG TABS  Every 4 hours PRN        11/04/23 1237              Maxwell Marion, PA-C 11/04/23 1605    Estelle June A, DO 11/09/23 2022

## 2023-11-04 NOTE — ED Triage Notes (Signed)
Pt BIB EMS with reports of SCC x 45 mins. Pt reports pain in his chest and back.

## 2023-11-04 NOTE — Discharge Instructions (Addendum)
As discussed, your labs and imaging are reassuring. I have sent a refill of your Oxycodone to [facility], as requested.   Follow up with your primary care provider in the next 5 to 7 days for reevaluation of your symptoms.  Get help right away if: You have a painful erection of the penis that lasts a long time (priapism). You become short of breath or are having trouble breathing. You have pain that cannot be controlled with medicine. You have any signs of a stroke. "BE FAST" is an easy way to remember the main warning signs: B - Balance. Dizziness, sudden trouble walking, or loss of balance. E - Eyes. Trouble seeing or a change in how you see. F - Face. Sudden weakness or loss of feeling of the face. The face or eyelid may droop on one side. A - Arms. Weakness or loss of feeling in an arm. This happens all of a sudden and most often on one side of the body. S - Speech. Sudden trouble speaking, slurred speech, or trouble understanding what people say. T - Time. Time to call emergency services. Write down what time symptoms started. You have other signs of a stroke, such as: A sudden, very bad headache with no known cause. Feeling like you may vomit (nausea). Vomiting. Seizure.

## 2023-11-04 NOTE — ED Notes (Signed)
Pt teaching provided on medications that may cause drowsiness. Pt instructed not to drive or operate heavy machinery while taking the prescribed medication. Pt verbalized understanding.   Pt provided discharge instructions and prescription information. Pt was given the opportunity to ask questions and questions were answered.   
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
## 2023-12-10 ENCOUNTER — Ambulatory Visit (INDEPENDENT_AMBULATORY_CARE_PROVIDER_SITE_OTHER): Payer: Medicaid Other | Admitting: Nurse Practitioner

## 2023-12-10 ENCOUNTER — Encounter: Payer: Self-pay | Admitting: Nurse Practitioner

## 2023-12-10 VITALS — BP 113/53 | HR 78 | Temp 97.9°F | Ht 67.0 in | Wt 198.0 lb

## 2023-12-10 DIAGNOSIS — I10 Essential (primary) hypertension: Secondary | ICD-10-CM | POA: Diagnosis not present

## 2023-12-10 DIAGNOSIS — G894 Chronic pain syndrome: Secondary | ICD-10-CM | POA: Diagnosis not present

## 2023-12-10 DIAGNOSIS — D571 Sickle-cell disease without crisis: Secondary | ICD-10-CM

## 2023-12-10 MED ORDER — FOLIC ACID 1 MG PO TABS: 1.0000 mg | ORAL_TABLET | Freq: Every day | ORAL | 1 refills | Status: AC

## 2023-12-10 MED ORDER — OXYCODONE HCL 10 MG PO TABS: 10.0000 mg | ORAL_TABLET | ORAL | 0 refills | Status: DC | PRN

## 2023-12-10 NOTE — Assessment & Plan Note (Signed)
 1. 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(HCC) (Primary)  - 161096 11+Oxyco+Alc+Crt-Bund - Sickle Cell Panel - Oxycodone  HCl 10 MG TABS; Take 1 tablet (10 mg total) by mouth every 4 (four) hours as needed (pain).  Dispense: 60 tablet; Refill: 0 - folic acid  (FOLVITE ) 1 MG tablet; Take 1 tablet (1 mg total) by mouth daily.  Dispense: 90 tablet; Refill: 1  2. Chronic pain syndrome  - Oxycodone  HCl 10 MG TABS; Take 1 tablet (10 mg total) by mouth every 4 (four) hours as needed (pain).  Dispense: 60 tablet; Refill: 0  3. Chronic, continuous use of opioids  - Oxycodone  HCl 10 MG TABS; Take 1 tablet (10 mg total) by mouth every 4 (four) hours as needed (pain).  Dispense: 60 tablet; Refill: 0
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
## 2023-12-10 NOTE — Assessment & Plan Note (Signed)
 Sickle cell disease - Continue  Folic acid  1 mg daily to prevent aplastic bone marrow crises.  Need to avoid triggers of infection discussed The patient was reminded of the need to seek medical attention of any symptoms of bleeding, anemia, or infection.   Pulmonary evaluation - Patient denies severe recurrent wheezes, shortness of breath with exercise, or persistent cough. I   Eye - High risk of proliferative retinopathy.  Has appointment with ophthalmologist today  Immunization status - Patient encouraged to consider getting flu vaccine and Tdap vaccines  Acute and chronic painful episodes -    We discussed that pt is to receive her Schedule II prescriptions only from us . Pt is also aware that the prescription history is available to us  online through the [facility] CSRS. We reminded the patient that all patients receiving Schedule II narcotics must be seen for follow within the 3 months in the office.  We reviewed the terms of our pain agreement, including the need to keep medicines in a safe locked location away from children or pets, and the need to report excess sedation or constipation, measures to avoid constipation, and policies related to early refills and stolen prescriptions. According to the [place] Chronic Pain Initiative program, we have reviewed details related to analgesia, adverse effects, aberrant behaviors. Reviewed [place] Substance Reporting system prior to prescribing opiate medication, no inconsistencies noted.   Continue ibuprofen  800 mg 3 times daily as needed -continue Oxycodone  HCl 10 MG TABS; Take 1 tablet (10 mg total) by mouth every 4 (four) hours as needed (pain).  Dispense: 60 tablet; Refill: 0 - folic acid  (FOLVITE ) 1 MG tablet; Take 1 tablet (1 mg total) by mouth daily.  Dispense: 90 tablet; Refill: 1

## 2023-12-10 NOTE — Patient Instructions (Signed)
 Please consider getting influenza  and Tdap vaccine at local pharmacy.    It is important that you exercise regularly at least 30 minutes 5 times a week as tolerated  Think about what you will eat, plan ahead. Choose " clean, green, fresh or frozen" over canned, processed or packaged foods which are more sugary, salty and fatty. 70 to 75% of food eaten should be vegetables and fruit. Three meals at set times with snacks allowed between meals, but they must be fruit or vegetables. Aim to eat over a 12 hour period , example 7 am to 7 pm, and STOP after  your last meal of the day. Drink water,generally about 64 ounces per day, no other drink is as healthy. Fruit juice is best enjoyed in a healthy way, by EATING the fruit.  [facility] for choosing [facility] we consider it a privelige to serve you.

## 2023-12-10 NOTE — Assessment & Plan Note (Signed)
 BP Readings from Last 3 Encounters:  12/10/23 (!) 113/53  11/04/23 (!) 106/59  10/13/23 119/76  Controlled on losartan  50 mg daily Continue current medication

## 2023-12-11 LAB — CMP14+CBC/D/PLT+FER+RETIC+V...
ALT: 111 [IU]/L — ABNORMAL HIGH (ref 0–44)
AST: 375 [IU]/L — ABNORMAL HIGH (ref 0–40)
Albumin: 4.3 g/dL (ref 4.3–5.2)
Alkaline Phosphatase: 77 [IU]/L (ref 44–121)
BUN/Creatinine Ratio: 8 — ABNORMAL LOW (ref 9–20)
BUN: 6 mg/dL (ref 6–20)
Basophils Absolute: 0.1 10*3/uL (ref 0.0–0.2)
Basos: 1 %
Bilirubin Total: 3.6 mg/dL — ABNORMAL HIGH (ref 0.0–1.2)
CO2: 23 mmol/L (ref 20–29)
Calcium: 9.1 mg/dL (ref 8.7–10.2)
Chloride: 103 mmol/L (ref 96–106)
Creatinine, Ser: 0.75 mg/dL — ABNORMAL LOW (ref 0.76–1.27)
EOS (ABSOLUTE): 0.4 10*3/uL (ref 0.0–0.4)
Eos: 4 %
Ferritin: 144 ng/mL (ref 30–400)
Globulin, Total: 2.4 g/dL (ref 1.5–4.5)
Glucose: 89 mg/dL (ref 70–99)
Hematocrit: 30 % — ABNORMAL LOW (ref 37.5–51.0)
Hemoglobin: 10.2 g/dL — ABNORMAL LOW (ref 13.0–17.7)
Immature Grans (Abs): 0 10*3/uL (ref 0.0–0.1)
Immature Granulocytes: 0 %
Lymphocytes Absolute: 3.8 10*3/uL — ABNORMAL HIGH (ref 0.7–3.1)
Lymphs: 39 %
MCH: 29.8 pg (ref 26.6–33.0)
MCHC: 34 g/dL (ref 31.5–35.7)
MCV: 88 fL (ref 79–97)
Monocytes Absolute: 1.1 10*3/uL — ABNORMAL HIGH (ref 0.1–0.9)
Monocytes: 11 %
NRBC: 1 % — ABNORMAL HIGH (ref 0–0)
Neutrophils Absolute: 4.2 10*3/uL (ref 1.4–7.0)
Neutrophils: 45 %
Platelets: 402 10*3/uL (ref 150–450)
Potassium: 4.6 mmol/L (ref 3.5–5.2)
RBC: 3.42 x10E6/uL — ABNORMAL LOW (ref 4.14–5.80)
RDW: 20.5 % — ABNORMAL HIGH (ref 11.6–15.4)
Retic Ct Pct: 7.3 % — ABNORMAL HIGH (ref 0.6–2.6)
Sodium: 139 mmol/L (ref 134–144)
Total Protein: 6.7 g/dL (ref 6.0–8.5)
Vit D, 25-Hydroxy: 31.1 ng/mL (ref 30.0–100.0)
WBC: 9.6 10*3/uL (ref 3.4–10.8)
eGFR: 129 mL/min/{1.73_m2} (ref >59)

## 2023-12-15 ENCOUNTER — Other Ambulatory Visit: Payer: Self-pay

## 2023-12-15 ENCOUNTER — Other Ambulatory Visit: Payer: Self-pay | Admitting: Nurse Practitioner

## 2023-12-15 ENCOUNTER — Other Ambulatory Visit (HOSPITAL_COMMUNITY): Payer: Self-pay

## 2023-12-15 DIAGNOSIS — D571 Sickle-cell disease without crisis: Secondary | ICD-10-CM

## 2023-12-15 DIAGNOSIS — G894 Chronic pain syndrome: Secondary | ICD-10-CM

## 2023-12-15 MED ORDER — OXYCODONE HCL 10 MG PO TABS
10.0000 mg | ORAL_TABLET | ORAL | 0 refills | Status: DC | PRN
  Filled 2023-12-15: qty 60, 10d supply, fill #0

## 2023-12-15 NOTE — Telephone Encounter (Signed)
Please advise if I can call pharmacy concern his oxycodone. Please advise [facility]

## 2023-12-16 LAB — DRUG SCREEN 764883 11+OXYCO+ALC+CRT-BUND
Amphetamines, Urine: NEGATIVE ng/mL
BENZODIAZ UR QL: NEGATIVE ng/mL
Barbiturate: NEGATIVE ng/mL
Cocaine (Metabolite): NEGATIVE ng/mL
Creatinine: 101.4 mg/dL (ref 20.0–300.0)
Ethanol: NEGATIVE %
Meperidine: NEGATIVE ng/mL
Methadone Screen, Urine: NEGATIVE ng/mL
OPIATE SCREEN URINE: NEGATIVE ng/mL
Phencyclidine: NEGATIVE ng/mL
Propoxyphene: NEGATIVE ng/mL
Tramadol: NEGATIVE ng/mL
pH, Urine: 6.3 (ref 4.5–8.9)

## 2023-12-16 LAB — CANNABINOID CONFIRMATION, UR
CANNABINOIDS: POSITIVE — AB
Carboxy THC GC/MS Conf: 472 ng/mL

## 2023-12-16 LAB — OXYCODONE/OXYMORPHONE, CONFIRM: OXYCODONE/OXYMORPH: NEGATIVE

## 2024-01-06 ENCOUNTER — Ambulatory Visit: Payer: Self-pay | Admitting: Nurse Practitioner

## 2024-01-14 ENCOUNTER — Telehealth (HOSPITAL_COMMUNITY): Payer: Self-pay

## 2024-01-14 ENCOUNTER — Ambulatory Visit: Payer: Self-pay | Admitting: Nurse Practitioner
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
## 2024-01-14 NOTE — Telephone Encounter (Signed)
Copied from CRM [id number]. Topic: Clinical - Red Word Triage >> Jan 14, 2024 11:38 AM [name] Hill wrote: Red Word that prompted transfer to Nurse Triage: Pt is having a sickle cell crisis. Pt his having pain in both legs but mostly the left leg   Chief Complaint: Sickle cell pain Symptoms: Bilateral leg pain Frequency: Constant  Pertinent Negatives: Patient denies fever, swelling, shortness of breath  Disposition: [] ED /[] Urgent Care (no appt availability in office) / [] Appointment(In office/virtual)/ []  [place] [facility]/ [] Home Care/ [] Refused Recommended Disposition /[] [place] [facility]/ [x]  Follow-up with PCP Additional Notes: Patient reports that yesterday he began to experience bilateral leg pain. He states his pain is constant and is 6/10. He denies any shortness of breath, swelling, or fever. Patient transferred to the sickle cell day hospital for further triage and help.     Reason for Disposition  [1] MODERATE sickle cell pain (e.g., pain episode, sickle cell attack) AND [2] has pain management plan AND [3] NOT better after taking pain medicine per plan  Answer Assessment - Initial Assessment Questions 1. ONSET: "When did the pain begin?"      Yesterday  2. LOCATION: "Where does it hurt?"      Bilateral legs  3. SEVERITY: "How bad is the pain?"  (e.g., Scale 1-10; mild, moderate, or severe)   - MILD (1-3): doesn't interfere with normal activities    - MODERATE (4-7): interferes with normal activities or awakens from sleep    - SEVERE (8-10): excruciating pain, unable to do any normal activities      6/10 4. PATTERN: "Is the pain constant?" (e.g., yes, no; constant, intermittent)      Constant  5. CAUSE:  What do you think is causing the pain? Is this pain similar to the acute pain attacks you have had in the past? (e.g., similar location, quality, intensity)     Believes it's due to the cold 6. PAIN MEDICINES:    - "Do you have a plan from your doctor for treating  your acute pain episodes?" (e.g., pain management plan)   - "What have you taken so far for the pain?" (e.g., nothing, acetaminophen, NSAIDS, narcotic pain medicine).    - "How much has this helped relieve the pain?"    - "Have you done anything else to help the pain?" (e.g., heating pad)     Yes, taking home pain medication  7. OTHER SYMPTOMS: "Do you have any other symptoms?" (e.g., fever, chest pain, shortness of breath, new areas of swelling or redness)     Sore throat and runny nose  Protocols used: Sickle Cell Disease - Acute Pain Episode (Crisis)-A-AH

## 2024-01-21 IMAGING — CT CT ANGIO CHEST
2 of 7 series · 18 of 36 positions shown · IV contrast (Omnipaque)
Comparison: Chest CT 03/28/2022.

CLINICAL DATA: 20-year-old male with history of sickle cell
disease. Pleuritic chest pain and hypoxia. Evaluate for pulmonary
embolism versus acute chest syndrome.

EXAM:
CT ANGIOGRAPHY CHEST WITH CONTRAST
TECHNIQUE: Multidetector CT imaging of the chest was performed using the
standard protocol during bolus administration of intravenous
contrast. Multiplanar CT image reconstructions and MIPs were
obtained to evaluate the vascular anatomy.

[Series 7: pe thins · axial · 0.77mm/px · z∈[+996,+1266]mm · 17 of 301 slices shown]
[im 16/301  lung]
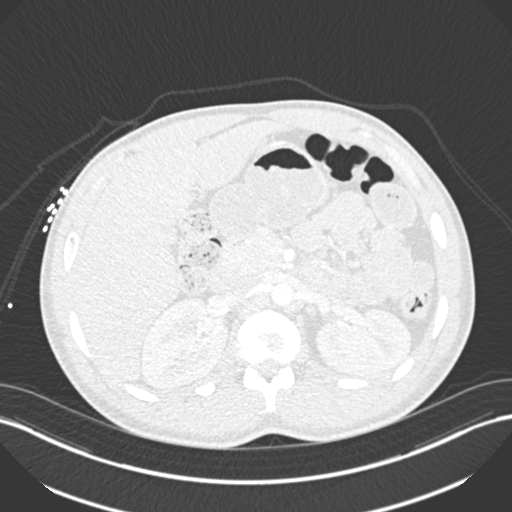
[im 31/301  mediastinal]
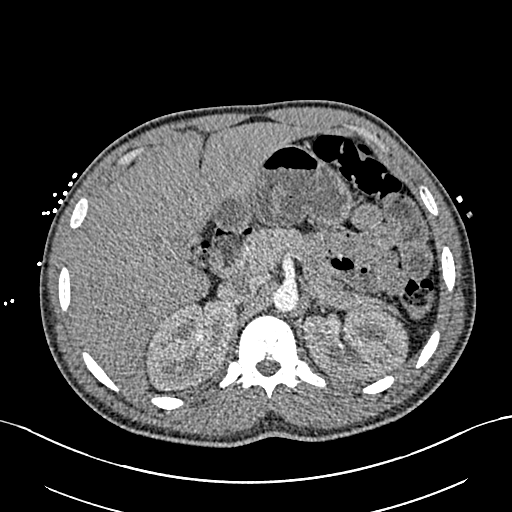
[im 46/301  lung]
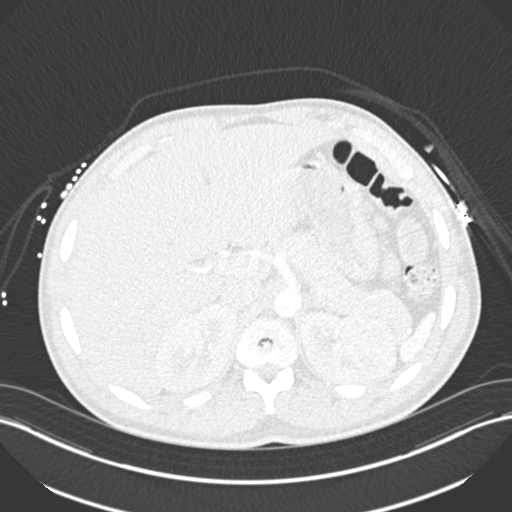
[im 61/301  mediastinal]
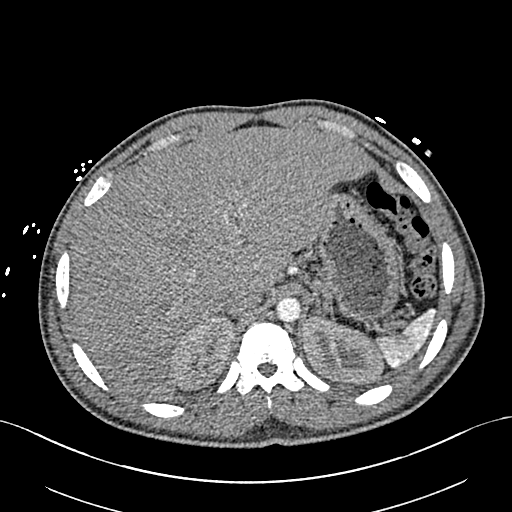
[im 91/301  lung]
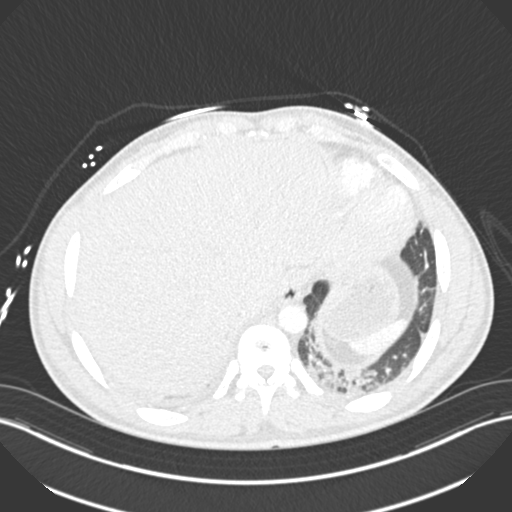
[im 106/301  mediastinal]
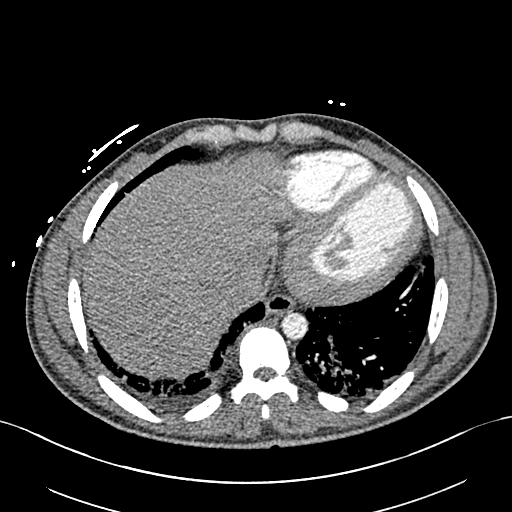
[im 121/301  lung]
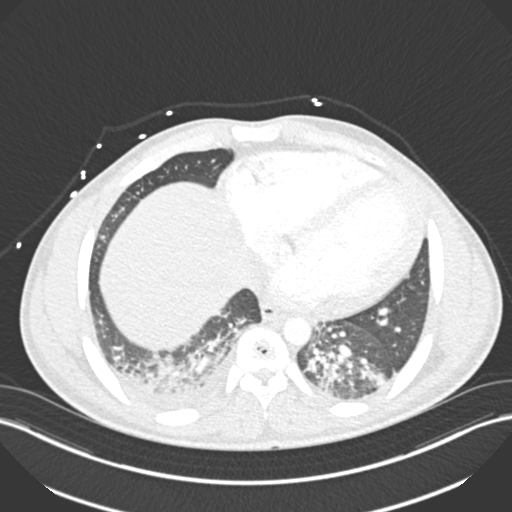
[im 136/301  mediastinal]
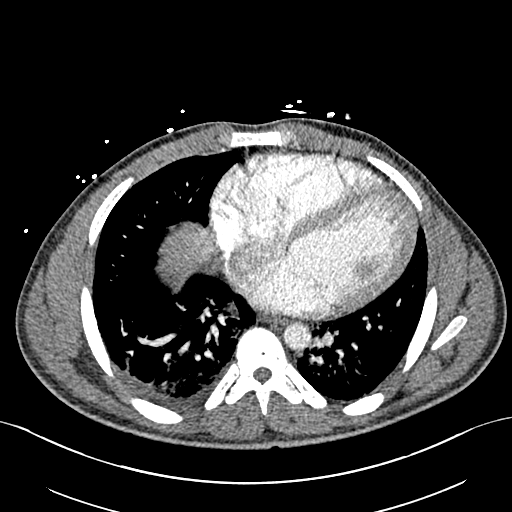
[im 151/301  lung]
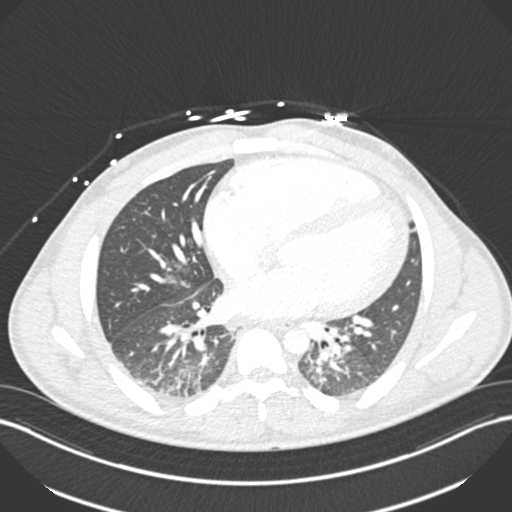
[im 166/301  mediastinal]
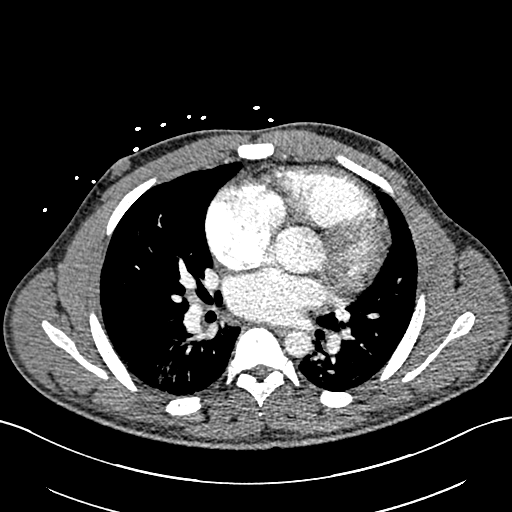
[im 181/301  lung]
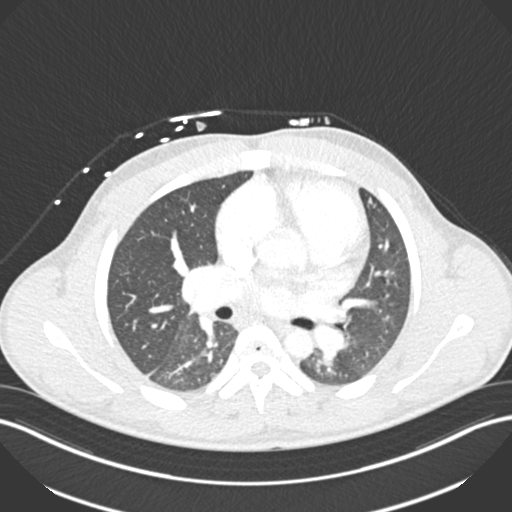
[im 196/301  mediastinal]
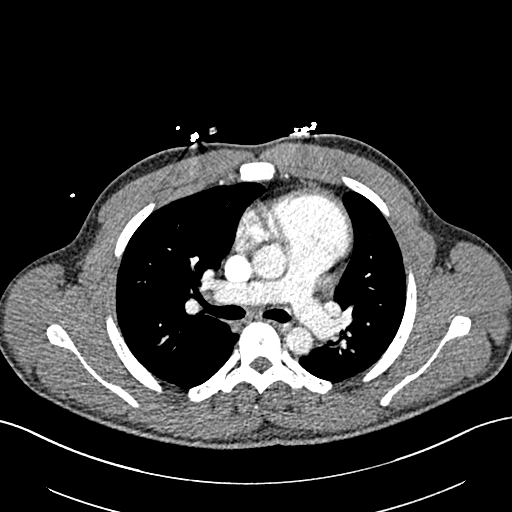
[im 211/301  lung]
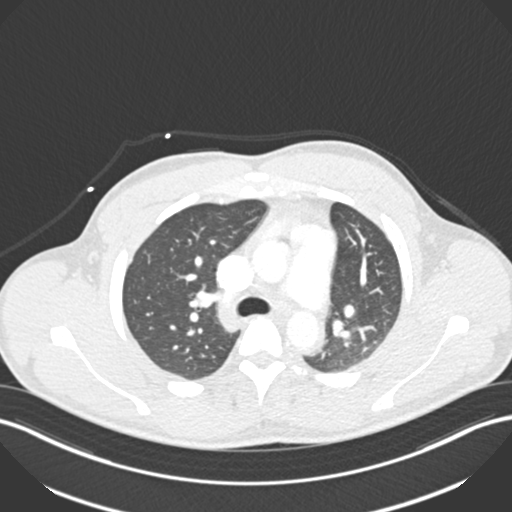
[im 241/301  mediastinal]
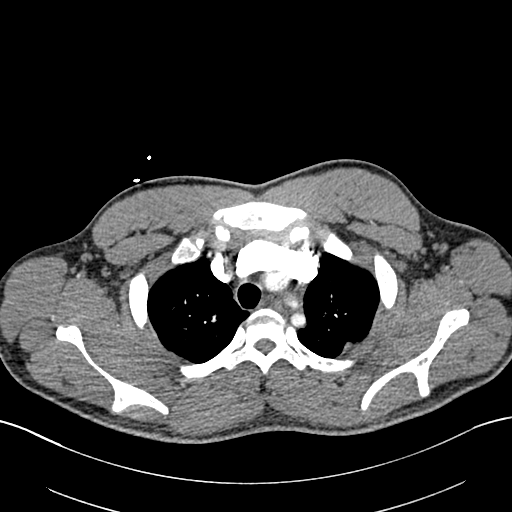
[im 256/301  lung]
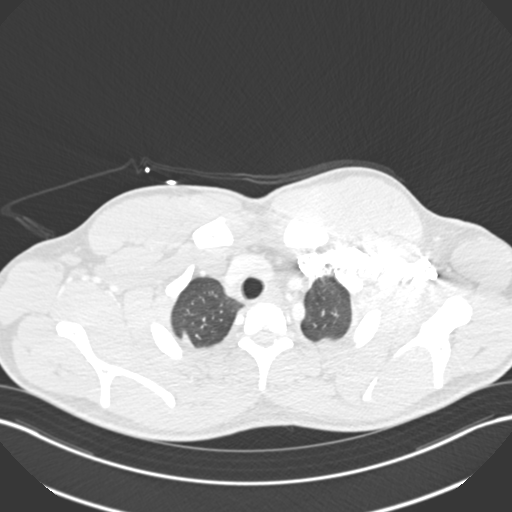
[im 271/301  mediastinal]
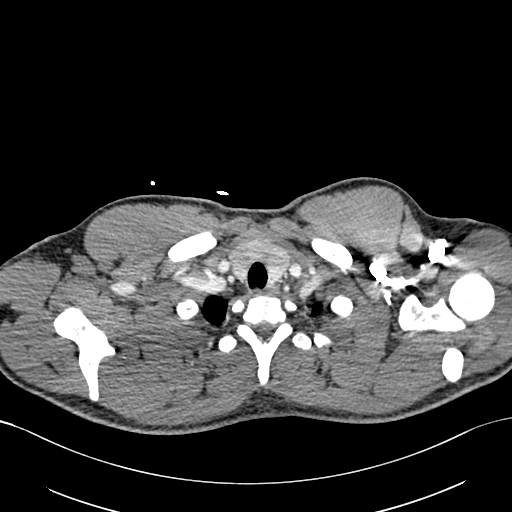
[im 286/301  lung]
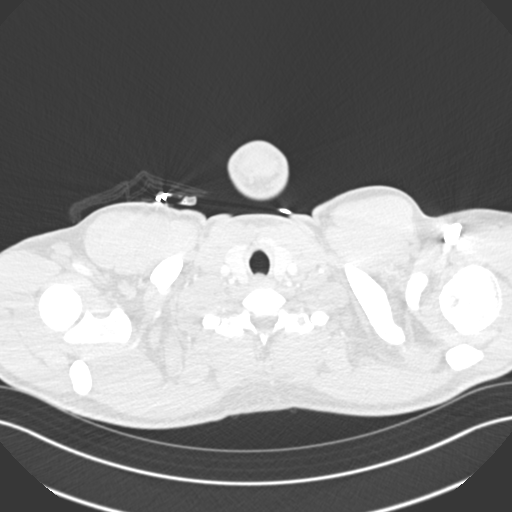

[Series 8: pe coronal mpr · coronal · 0.63mm/px · 1 of 138 slices shown]
[im 69/138  mediastinal]
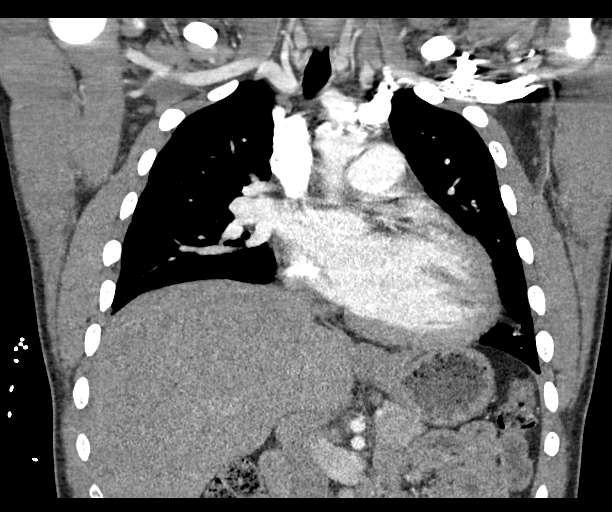

[18 of 36 positions shown; findings below may reference images not displayed]

RADIATION DOSE REDUCTION: This exam was performed according to the
departmental dose-optimization program which includes automated
exposure control, adjustment of the mA and/or kV according to
patient size and/or use of iterative reconstruction technique.

CONTRAST:  100mL OMNIPAQUE IOHEXOL 350 MG/ML SOLN
FINDINGS: Cardiovascular: No filling defects in the pulmonary arterial tree to
suggest pulmonary embolism. Heart size is mildly enlarged. There is
no significant pericardial fluid, thickening or pericardial
calcification. No atherosclerotic calcifications in the thoracic
aorta or the coronary arteries. Left vertebral artery arises
directly from the aortic arch (normal anatomical variant)
incidentally noted.

Mediastinum/Nodes: No pathologically enlarged mediastinal or hilar
lymph nodes. Esophagus is unremarkable in appearance. No axillary
lymphadenopathy.

Lungs/Pleura: Patchy areas of peribronchovascular ground-glass
attenuation, thickening of the peribronchovascular interstitium and
regional architectural distortion and volume loss are noted in the
lower lobes of the lungs bilaterally, increased compared to the
recent prior study. Trace right pleural effusion lying dependently.
No definite left pleural effusion. Mid to upper lungs are otherwise
clear.

Upper Abdomen: Spleen is severely atrophic and densely calcified.

Musculoskeletal: Diffuse sclerosis throughout the visualized axial
and appendicular skeleton, sequela of sickle cell disease. H-shaped
vertebral bodies again noted. No aggressive appearing lytic or
blastic lesions are otherwise noted in the visualized portions of
the skeleton.

Review of the MIP images confirms the above findings.
IMPRESSION: 1. No evidence of pulmonary embolism.
2. Worsening areas of airspace consolidation in the lung bases,
superimposed upon areas of chronic scarring, in addition to new
trace right pleural effusion lying dependently. Imaging findings are
concerning for acute chest syndrome.
3. Chronic imaging stigmata of sickle cell disease redemonstrated.

## 2024-01-21 IMAGING — CR DG CHEST 2V
2 series · 2 of 2 positions shown · non-contrast
Comparison: CTA chest 03/28/2022 and earlier.

CLINICAL DATA: 20-year-old male with sickle cell disease. Pleuritic
chest pain. Hypoxia.

EXAM:
CHEST - 2 VIEW

[w chest pa]
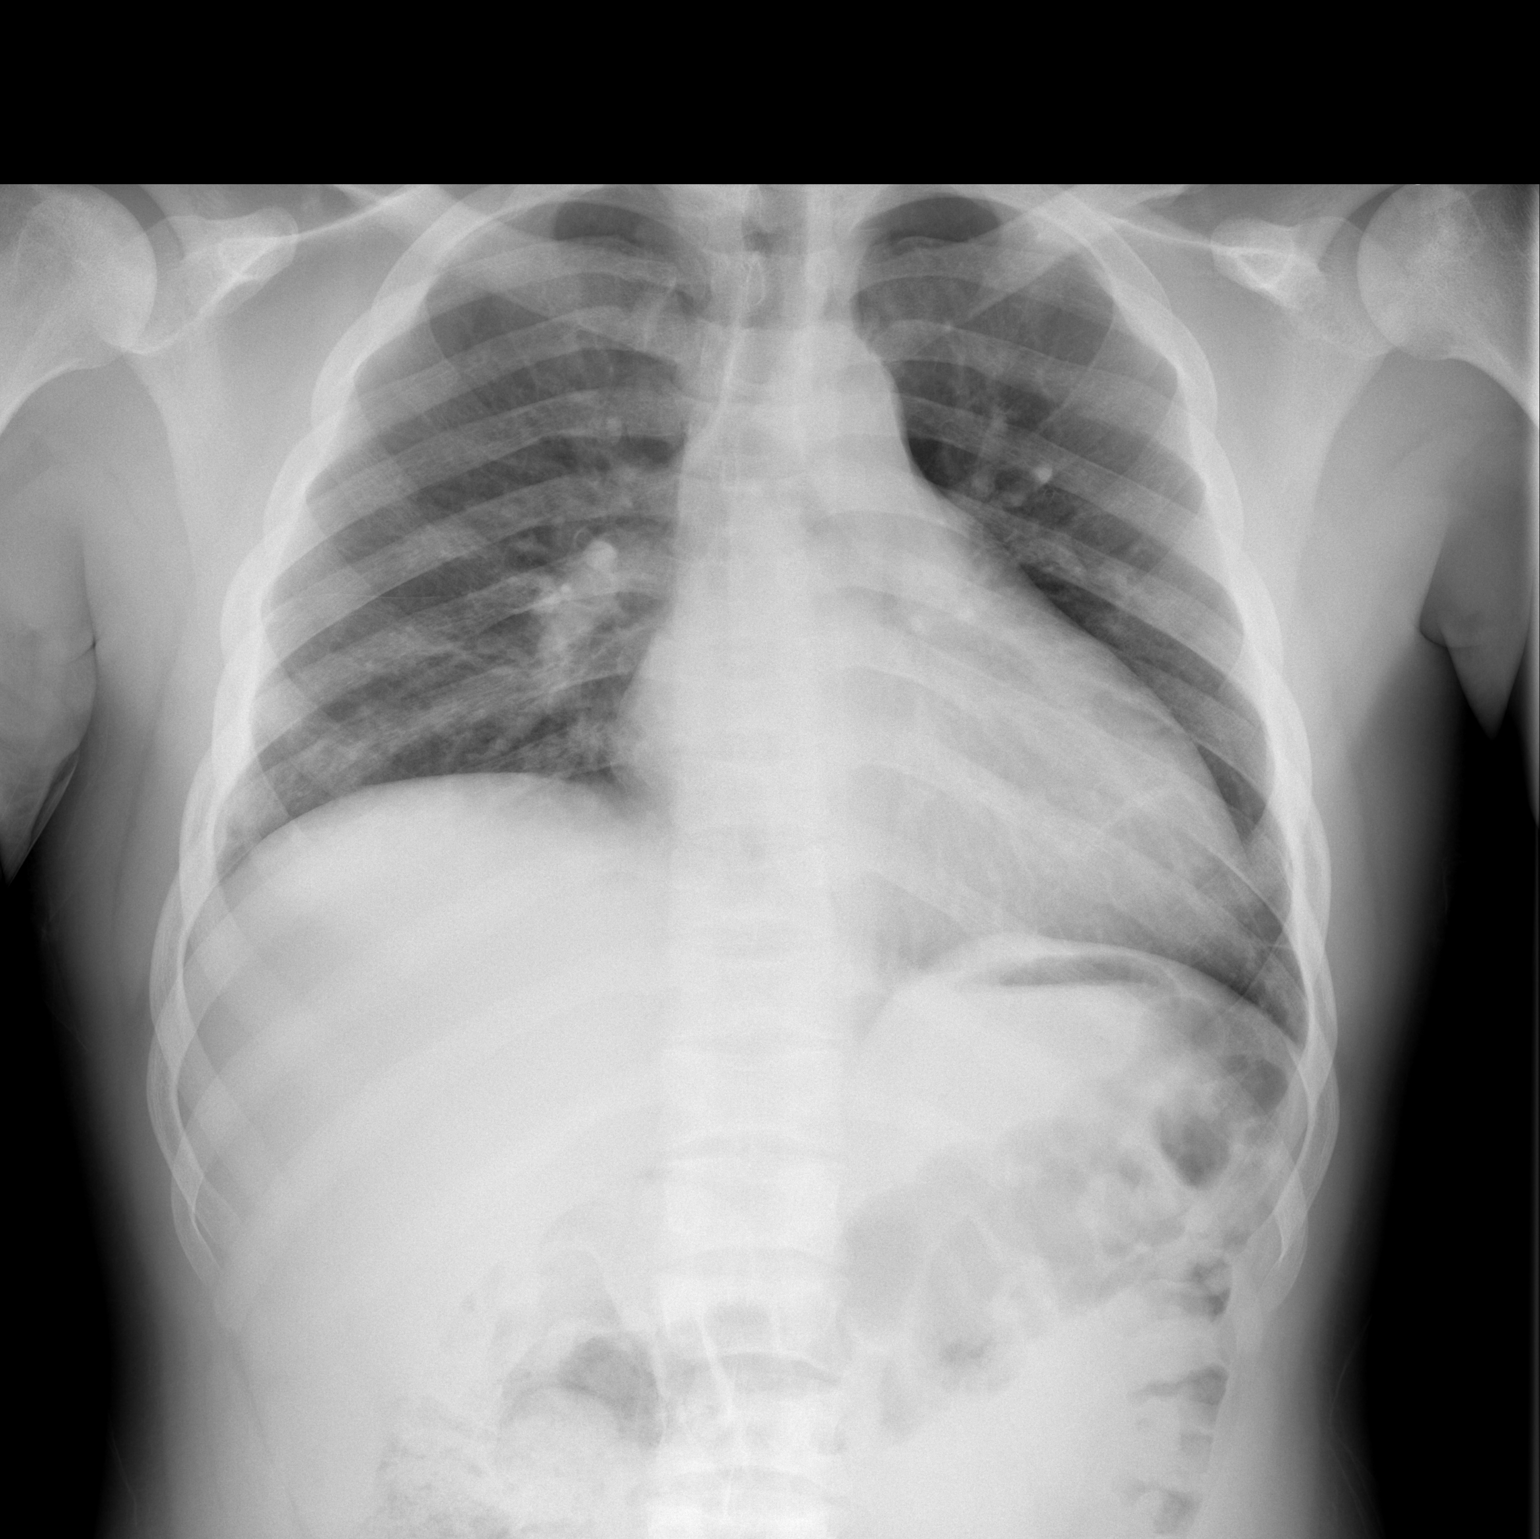

[w chest lat]
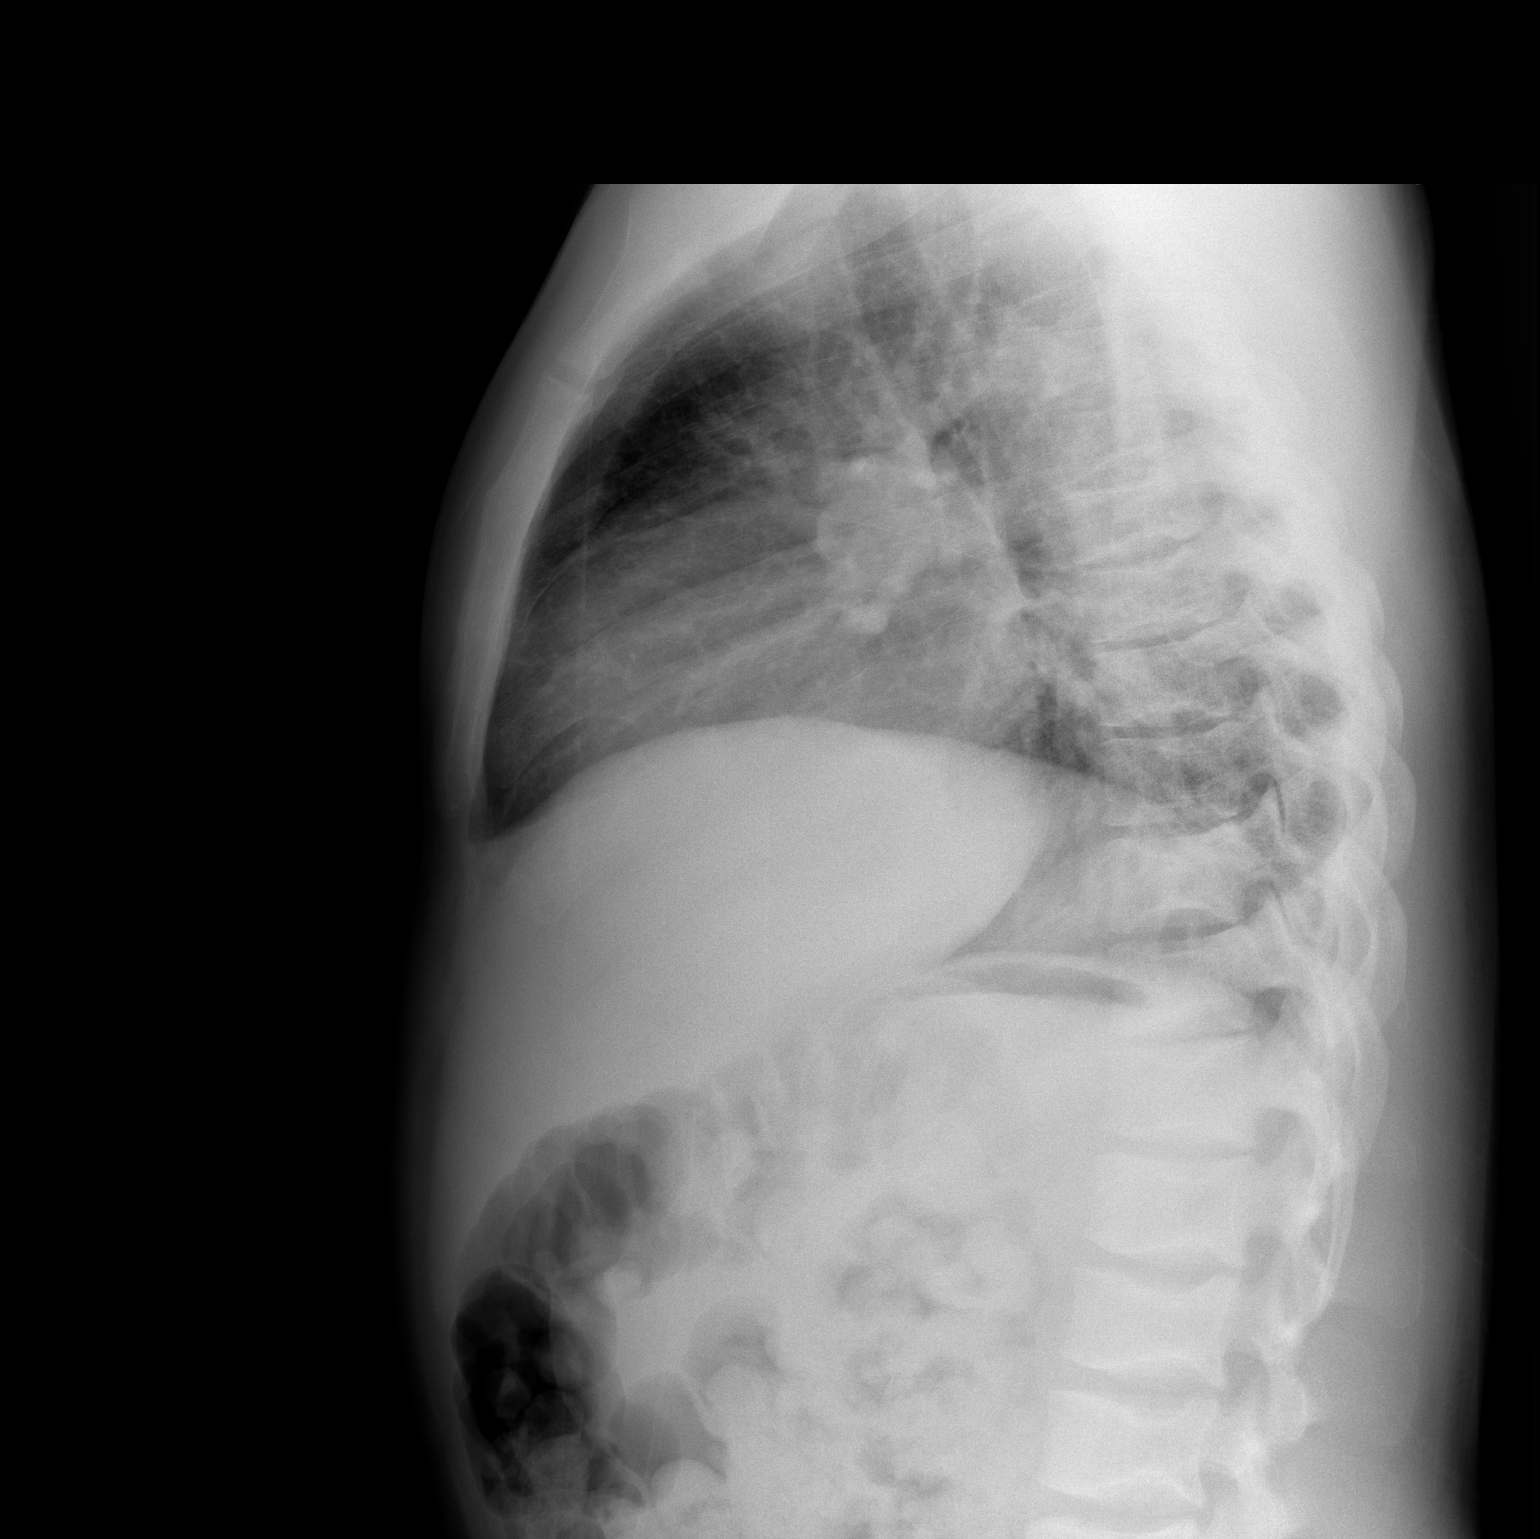

[2 of 2 positions shown; findings below may reference images not displayed]

FINDINGS: PA and lateral views at 7371 hours. Stable mild-to-moderate
cardiomegaly. Other mediastinal contours are within normal limits.
Stable lung volumes with unchanged mild elevation of the right
hemidiaphragm, normal variant. No pneumothorax, pulmonary edema,
pleural effusion, or confluent pulmonary opacity. Visualized
tracheal air column is within normal limits.

H-shaped vertebral endplate deformities of sickle cell disease. No
acute osseous abnormality identified. Negative visible bowel gas.
IMPRESSION: Sequelae of sickle cell disease including cardiomegaly. No acute
cardiopulmonary abnormality.

## 2024-01-24 ENCOUNTER — Emergency Department (HOSPITAL_COMMUNITY)
Admission: EM | Admit: 2024-01-24 | Discharge: 2024-01-24 | Discharge status: Against Medical Advice | Attending: Emergency Medicine | Admitting: Emergency Medicine

## 2024-01-24 ENCOUNTER — Other Ambulatory Visit: Payer: Self-pay

## 2024-01-24 ENCOUNTER — Emergency Department (HOSPITAL_COMMUNITY)

## 2024-01-24 ENCOUNTER — Encounter (HOSPITAL_COMMUNITY): Payer: Self-pay

## 2024-01-24 DIAGNOSIS — D5700 Hb-SS disease with crisis, unspecified: Secondary | ICD-10-CM | POA: Insufficient documentation

## 2024-01-24 DIAGNOSIS — M79601 Pain in right arm: Secondary | ICD-10-CM | POA: Insufficient documentation

## 2024-01-24 DIAGNOSIS — Z5321 Procedure and treatment not carried out due to patient leaving prior to being seen by health care provider: Secondary | ICD-10-CM | POA: Diagnosis not present

## 2024-01-24 DIAGNOSIS — M79605 Pain in left leg: Secondary | ICD-10-CM | POA: Insufficient documentation

## 2024-01-24 LAB — COMPREHENSIVE METABOLIC PANEL
ALT: 19 U/L (ref 0–44)
AST: 40 U/L (ref 15–41)
Albumin: 4 g/dL (ref 3.5–5.0)
Alkaline Phosphatase: 57 U/L (ref 38–126)
Anion gap: 10 (ref 5–15)
BUN: 7 mg/dL (ref 6–20)
CO2: 23 mmol/L (ref 22–32)
Calcium: 9.2 mg/dL (ref 8.9–10.3)
Chloride: 107 mmol/L (ref 98–111)
Creatinine, Ser: 0.63 mg/dL (ref 0.61–1.24)
GFR, Estimated: >60 mL/min (ref >60)
Glucose, Bld: 95 mg/dL (ref 70–99)
Potassium: 4.3 mmol/L (ref 3.5–5.1)
Sodium: 140 mmol/L (ref 135–145)
Total Bilirubin: 4.1 mg/dL — ABNORMAL HIGH (ref 0.0–1.2)
Total Protein: 6.6 g/dL (ref 6.5–8.1)

## 2024-01-24 LAB — RETICULOCYTES
Immature Retic Fract: 40.7 % — ABNORMAL HIGH (ref 2.3–15.9)
RBC.: 3.19 MIL/uL — ABNORMAL LOW (ref 4.22–5.81)
Retic Count, Absolute: 195.9 10*3/uL — ABNORMAL HIGH (ref 19.0–186.0)
Retic Ct Pct: 6.1 % — ABNORMAL HIGH (ref 0.4–3.1)

## 2024-01-24 LAB — CBC WITH DIFFERENTIAL/PLATELET
Abs Immature Granulocytes: 0.08 10*3/uL — ABNORMAL HIGH (ref 0.00–0.07)
Basophils Absolute: 0.2 10*3/uL — ABNORMAL HIGH (ref 0.0–0.1)
Basophils Relative: 2 %
Eosinophils Absolute: 0.4 10*3/uL (ref 0.0–0.5)
Eosinophils Relative: 4 %
HCT: 26.7 % — ABNORMAL LOW (ref 39.0–52.0)
Hemoglobin: 9.8 g/dL — ABNORMAL LOW (ref 13.0–17.0)
Immature Granulocytes: 1 %
Lymphocytes Relative: 39 %
Lymphs Abs: 3.7 10*3/uL (ref 0.7–4.0)
MCH: 30.2 pg (ref 26.0–34.0)
MCHC: 36.7 g/dL — ABNORMAL HIGH (ref 30.0–36.0)
MCV: 82.4 fL (ref 80.0–100.0)
Monocytes Absolute: 1.2 10*3/uL — ABNORMAL HIGH (ref 0.1–1.0)
Monocytes Relative: 12 %
Neutro Abs: 3.9 10*3/uL (ref 1.7–7.7)
Neutrophils Relative %: 42 %
Platelets: 444 10*3/uL — ABNORMAL HIGH (ref 150–400)
RBC: 3.24 MIL/uL — ABNORMAL LOW (ref 4.22–5.81)
RDW: 19.9 % — ABNORMAL HIGH (ref 11.5–15.5)
WBC: 9.4 10*3/uL (ref 4.0–10.5)
nRBC: 0.5 % — ABNORMAL HIGH (ref 0.0–0.2)

## 2024-01-24 NOTE — ED Notes (Signed)
Pt called X3 to go to a room. Pt could not be found.  

## 2024-01-24 NOTE — ED Provider Notes (Signed)
 Patient eloped from emergency room prior to being seen in my pod.   [name] Ade, MD 01/24/24 [phone]

## 2024-01-24 NOTE — ED Triage Notes (Signed)
 Pt c/o right arm and left leg pain that is typical for his sickle cell pain crises. Pt states pain started yesterday and he has taken his home meds w/o relief.

## 2024-01-24 NOTE — ED Provider Triage Note (Signed)
 Emergency Medicine Provider Triage Evaluation Note  [name] Hill , a 23 y.o. male  was evaluated in triage.  Pt complains of L elbow and L hip painx2 days. Has taken home meds w/o relief. Denies chest pain, SOB, fever, chills. No IVDU or trauma. Typical for sickle cell pain crisis per patient..  Review of Systems  Positive: Joint pain Negative: fevers  Physical Exam  BP 124/76 (BP Location: Left Arm)   Pulse 78   Temp 98.3 F (36.8 C) (Oral)   Resp 20   Ht 5\' 7"  (1.702 m)   Wt 83.9 kg   SpO2 93%   BMI 28.98 kg/m  Gen:   Awake, no distress   Resp:  Normal effort  MSK:   Moves extremities without difficulty  Other:  +radial and dorsalis pedis pulse, ROM intact, no effusion or rash/wound present  Medical Decision Making  Medically screening exam initiated at 2:51 PM.  Appropriate orders placed.  [name] was informed that the remainder of the evaluation will be completed by another provider, this initial triage assessment does not replace that evaluation, and the importance of remaining in the ED until their evaluation is complete.     Pete Pelt, [place] 01/24/24 1452

## 2024-01-26 ENCOUNTER — Telehealth: Payer: Self-pay

## 2024-01-26 NOTE — Transitions of Care (Post Inpatient/ED Visit) (Signed)
   01/26/2024  Name: [name] Hill MRN: [id number] DOB: [birth year]  Today's TOC FU Call Status: Today's TOC FU Call Status:: Unsuccessful Call (1st Attempt) Unsuccessful Call (1st Attempt) Date: 01/26/24  Attempted to reach the patient regarding the most recent Inpatient/ED visit.  Follow Up Plan: Additional outreach attempts will be made to reach the patient to complete the Transitions of Care (Post Inpatient/ED visit) call.   Signature  [employer], [place]

## 2024-02-04 ENCOUNTER — Ambulatory Visit: Payer: Self-pay | Admitting: Nurse Practitioner

## 2024-02-04 NOTE — Telephone Encounter (Signed)
  Communication  Patient has sickle cell disease and thinks he may been exposed to flu and it's starting to cause pain in his limbs                Chief Complaint: Cough, sore throat, fever, body aches. Symptoms: Above Frequency: Last night Pertinent Negatives: Patient denies  Disposition: [x] ED /[] Urgent Care (no appt availability in office) / [] Appointment(In office/virtual)/ []  [place] [facility]/ [] Home Care/ [] Refused Recommended Disposition /[] [place] [facility]/ []  Follow-up with PCP Additional Notes: Gershon Cull in the practice states no availability. If pt. Is concerned with sickle cell crisis he needs to go to ED. Pt. Verbalizes  understanding.   Reason for Disposition  Patient sounds very sick or weak to the triager  Answer Assessment - Initial Assessment Questions 1. WORST SYMPTOM: "What is your worst symptom?" (e.g., cough, runny nose, muscle aches, headache, sore throat, fever)      Limbs ache, cough, sore throat, fever 2. ONSET: "When did your flu symptoms start?"      Last night 3. COUGH: "How bad is the cough?"       Yes 4. Rickle cellESPIRATORY DISTRESS: "Describe your breathing."      Mild SOB with cough 5. FEVER: "Do you have a fever?" If Yes, ask: "What is your temperature, how was it measured, and when did it start?"     Yes, unable to check 6. EXPOSURE: "Were you exposed to someone with influenza?"       Unsure 7. FLU VACCINE: "Did you get a flu shot this year?"     N/a 8. HIGH RISK DISEASE: "Do you have any chronic medical problems?" (e.g., heart or lung disease, asthma, weak immune system, or other HIGH RISK conditions)     Sickle cell 9. PREGNANCY: "Is there any chance you are pregnant?" "When was your last menstrual period?"     N/a 10. OTHER SYMPTOMS: "Do you have any other symptoms?"  (e.g., runny nose, muscle aches, headache, sore throat)       Body aches  Protocols used: Influenza (Flu) - [facility]

## 2024-02-04 NOTE — Telephone Encounter (Signed)
 Chief Complaint: covid positive test today at [facility]. Requesting if additional medication needed and wants 2nd opinion due to hx sickle cell. Wants refill of oxycodone  Symptoms: cough causes chest pain and SOB. Body aches , arms hurting, reports fever no temp reported. Frequency: yesterday  Pertinent Negatives: Patient denies chest pain with difficulty breathing without cough.  Disposition: [] ED /[] Urgent Care (no appt availability in office) / [] Appointment(In office/virtual)/ []  [place] [facility]/ [] Home Care/ [] Refused Recommended Disposition /[] [place] [facility]/ [x]  Follow-up with PCP Additional Notes:   Please advise if patient needs VV today. Patient requesting 2nd opinion if additional medication for covid positive results, other than cough medication. Hx sickle cell disease and afraid of crisis developing. Patient requesting call back. Please advise. Recommended if sx worsen go to ED. CAL notified of patient request      Copied from CRM [id number]. Topic: Clinical - Medical Advice >> Feb 04, 2024  1:18 PM Albin Felling L wrote: Reason for CRM: Patient went to Urgent Care and tested positive for COVID. Patient prescribed cough medication. Patient inquiring of anything else that can be done to prevent symptoms from worsening.   Patient also requested refill for oxycodone through pharmacy and  patient checking on rx request. Reason for Disposition  [1] HIGH RISK patient (e.g., weak immune system, age > 64 years, obesity with BMI 30 or higher, pregnant, chronic lung disease or other chronic medical condition) AND [2] COVID symptoms (e.g., cough, fever)  (Exceptions: Already seen by PCP and no new or worsening symptoms.)  Answer Assessment - Initial Assessment Questions 1. COVID-19 DIAGNOSIS: "How do you know that you have COVID?" (e.g., positive lab test or self-test, diagnosed by doctor or NP/PA, symptoms after exposure).     [facility] covid test [facility] at [facility] 2. COVID-19 EXPOSURE: "Was  there any known exposure to COVID before the symptoms began?" CDC Definition of close contact: within 6 feet (2 meters) for a total of 15 minutes or more over a 24-hour period.      Na  3. ONSET: "When did the COVID-19 symptoms start?"      Yesterday  4. WORST SYMPTOM: "What is your worst symptom?" (e.g., cough, fever, shortness of breath, muscle aches)     Chest discomfort from coughing, SOB at times  5. COUGH: "Do you have a cough?" If Yes, ask: "How bad is the cough?"       Yes  6. FEVER: "Do you have a fever?" If Yes, ask: "What is your temperature, how was it measured, and when did it start?"     Reports yes but did not report temp 7. RESPIRATORY STATUS: "Describe your breathing?" (e.g., normal; shortness of breath, wheezing, unable to speak)      Reports SOB with coughing  8. BETTER-SAME-WORSE: "Are you getting better, staying the same or getting worse compared to yesterday?"  If getting worse, ask, "In what way?"     na 9. OTHER SYMPTOMS: "Do you have any other symptoms?"  (e.g., chills, fatigue, headache, loss of smell or taste, muscle pain, sore throat)     Cough , body aches, arms hurting ,chest pain with coughing SOB with coughing  10. HIGH RISK DISEASE: "Do you have any chronic medical problems?" (e.g., asthma, heart or lung disease, weak immune system, obesity, etc.)       Hx sickle cell 11. VACCINE: "Have you had the COVID-19 vaccine?" If Yes, ask: "Which one, how many shots, when did you get it?"  na 12. PREGNANCY: "Is there any chance you are pregnant?" "When was your last menstrual period?"       na 13. O2 SATURATION MONITOR:  "Do you use an oxygen saturation monitor (pulse oximeter) at home?" If Yes, ask "What is your reading (oxygen level) today?" "What is your usual oxygen saturation reading?" (e.g., 95%)       O2 sat at 92 % while in [facility]  Protocols used: Coronavirus (COVID-19) Diagnosed or Suspected-A-AH

## 2024-02-09 ENCOUNTER — Other Ambulatory Visit: Payer: Self-pay | Admitting: Nurse Practitioner

## 2024-02-09 DIAGNOSIS — D571 Sickle-cell disease without crisis: Secondary | ICD-10-CM

## 2024-02-09 DIAGNOSIS — G894 Chronic pain syndrome: Secondary | ICD-10-CM

## 2024-02-09 MED ORDER — OXYCODONE HCL 10 MG PO TABS: 10.0000 mg | ORAL_TABLET | ORAL | 0 refills | Status: DC | PRN

## 2024-02-09 NOTE — Telephone Encounter (Signed)
 Please advise [facility]

## 2024-02-09 NOTE — Progress Notes (Signed)
 Reviewed PDMP substance reporting system prior to prescribing opiate medications. No inconsistencies noted.   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  Dispense: 60 tablet; Refill: 0  2. Chronic pain syndrome  - Oxycodone HCl 10 MG TABS; Take 1 tablet (10 mg total) by mouth every 4 (four) hours as needed (pain).  Dispense: 60 tablet; Refill: 0

## 2024-02-10 ENCOUNTER — Other Ambulatory Visit: Payer: Self-pay

## 2024-02-10 DIAGNOSIS — G894 Chronic pain syndrome: Secondary | ICD-10-CM

## 2024-02-10 DIAGNOSIS — D571 Sickle-cell disease without crisis: Secondary | ICD-10-CM

## 2024-02-10 NOTE — Telephone Encounter (Signed)
 Please advise [facility]

## 2024-02-20 ENCOUNTER — Telehealth: Payer: Self-pay

## 2024-02-20 ENCOUNTER — Other Ambulatory Visit: Payer: Self-pay | Admitting: Nurse Practitioner

## 2024-02-20 DIAGNOSIS — D571 Sickle-cell disease without crisis: Secondary | ICD-10-CM

## 2024-02-20 DIAGNOSIS — G894 Chronic pain syndrome: Secondary | ICD-10-CM

## 2024-02-20 NOTE — Telephone Encounter (Signed)
 Patient is reporting that he is at the  [id number] - [place], [place] - [address] AT [facility] OF [address] [place] [address] Phone: [phone]  Fax: [phone]  And the pharmacy does not have the script

## 2024-02-23 ENCOUNTER — Other Ambulatory Visit: Payer: Self-pay | Admitting: Nurse Practitioner

## 2024-02-23 DIAGNOSIS — D571 Sickle-cell disease without crisis: Secondary | ICD-10-CM

## 2024-02-23 DIAGNOSIS — G894 Chronic pain syndrome: Secondary | ICD-10-CM

## 2024-03-09 ENCOUNTER — Ambulatory Visit: Payer: Self-pay | Admitting: Nurse Practitioner

## 2024-04-20 ENCOUNTER — Other Ambulatory Visit: Payer: Self-pay | Admitting: Nurse Practitioner

## 2024-04-20 DIAGNOSIS — G894 Chronic pain syndrome: Secondary | ICD-10-CM

## 2024-04-20 DIAGNOSIS — D571 Sickle-cell disease without crisis: Secondary | ICD-10-CM

## 2024-04-20 NOTE — Telephone Encounter (Unsigned)
 Copied from CRM [id number]. Topic: Clinical - Medication Refill >> Apr 20, 2024 11:48 AM Hobson Luna F wrote: Medication: Oxycodone  HCl 10 MG TABS [[id number]]  Has the patient contacted their pharmacy? Yes  (Agent: If yes, when and what did the pharmacy advise?) Contact office   This is the patient's preferred pharmacy:  [id number] - [place], [place] - [address] AT [facility] OF [address] [place] [address] Phone: [phone] Fax: [phone]  Is this the correct pharmacy for this prescription? Yes  Has the prescription been filled recently? Yes  Is the patient out of the medication? Yes  Has the patient been seen for an appointment in the last year OR does the patient have an upcoming appointment? Yes, last appointment in January 2025   Can we respond through MyChart? Yes  Agent: Please be advised that Rx refills may take up to 3 business days. We ask that you follow-up with your pharmacy.

## 2024-04-21 ENCOUNTER — Emergency Department (HOSPITAL_BASED_OUTPATIENT_CLINIC_OR_DEPARTMENT_OTHER)
Admission: EM | Admit: 2024-04-21 | Discharge: 2024-04-21 | Disposition: A | Discharge status: Home / Self Care | Attending: Emergency Medicine | Admitting: Emergency Medicine

## 2024-04-21 ENCOUNTER — Other Ambulatory Visit: Payer: Self-pay | Admitting: Nurse Practitioner

## 2024-04-21 ENCOUNTER — Encounter (HOSPITAL_BASED_OUTPATIENT_CLINIC_OR_DEPARTMENT_OTHER): Payer: Self-pay | Admitting: Emergency Medicine

## 2024-04-21 ENCOUNTER — Other Ambulatory Visit: Payer: Self-pay

## 2024-04-21 ENCOUNTER — Ambulatory Visit: Payer: Self-pay

## 2024-04-21 ENCOUNTER — Emergency Department (HOSPITAL_BASED_OUTPATIENT_CLINIC_OR_DEPARTMENT_OTHER)

## 2024-04-21 DIAGNOSIS — G894 Chronic pain syndrome: Secondary | ICD-10-CM

## 2024-04-21 DIAGNOSIS — D571 Sickle-cell disease without crisis: Secondary | ICD-10-CM

## 2024-04-21 DIAGNOSIS — D57219 Sickle-cell/Hb-C disease with crisis, unspecified: Secondary | ICD-10-CM | POA: Insufficient documentation

## 2024-04-21 DIAGNOSIS — D5700 Hb-SS disease with crisis, unspecified: Secondary | ICD-10-CM

## 2024-04-21 DIAGNOSIS — R0789 Other chest pain: Secondary | ICD-10-CM | POA: Diagnosis present

## 2024-04-21 DIAGNOSIS — R079 Chest pain, unspecified: Secondary | ICD-10-CM

## 2024-04-21 LAB — COMPREHENSIVE METABOLIC PANEL WITH GFR
ALT: 30 U/L (ref 0–44)
AST: 60 U/L — ABNORMAL HIGH (ref 15–41)
Albumin: 4.5 g/dL (ref 3.5–5.0)
Alkaline Phosphatase: 77 U/L (ref 38–126)
Anion gap: 12 (ref 5–15)
BUN: 11 mg/dL (ref 6–20)
CO2: 22 mmol/L (ref 22–32)
Calcium: 9.4 mg/dL (ref 8.9–10.3)
Chloride: 106 mmol/L (ref 98–111)
Creatinine, Ser: 0.66 mg/dL (ref 0.61–1.24)
GFR, Estimated: >60 mL/min (ref >60)
Glucose, Bld: 98 mg/dL (ref 70–99)
Potassium: 4.2 mmol/L (ref 3.5–5.1)
Sodium: 140 mmol/L (ref 135–145)
Total Bilirubin: 2.8 mg/dL — ABNORMAL HIGH (ref 0.0–1.2)
Total Protein: 7.1 g/dL (ref 6.5–8.1)

## 2024-04-21 LAB — RETICULOCYTES
Immature Retic Fract: 33.7 % — ABNORMAL HIGH (ref 2.3–15.9)
RBC.: 3.47 MIL/uL — ABNORMAL LOW (ref 4.22–5.81)
Retic Count, Absolute: 202.6 10*3/uL — ABNORMAL HIGH (ref 19.0–186.0)
Retic Ct Pct: 5.8 % — ABNORMAL HIGH (ref 0.4–3.1)

## 2024-04-21 LAB — CBC WITH DIFFERENTIAL/PLATELET
Abs Immature Granulocytes: 0.03 10*3/uL (ref 0.00–0.07)
Basophils Absolute: 0.1 10*3/uL (ref 0.0–0.1)
Basophils Relative: 1 %
Eosinophils Absolute: 0.2 10*3/uL (ref 0.0–0.5)
Eosinophils Relative: 3 %
HCT: 28 % — ABNORMAL LOW (ref 39.0–52.0)
Hemoglobin: 10.2 g/dL — ABNORMAL LOW (ref 13.0–17.0)
Immature Granulocytes: 0 %
Lymphocytes Relative: 41 %
Lymphs Abs: 3.8 10*3/uL (ref 0.7–4.0)
MCH: 29.5 pg (ref 26.0–34.0)
MCHC: 36.4 g/dL — ABNORMAL HIGH (ref 30.0–36.0)
MCV: 80.9 fL (ref 80.0–100.0)
Monocytes Absolute: 1 10*3/uL (ref 0.1–1.0)
Monocytes Relative: 11 %
Neutro Abs: 4.2 10*3/uL (ref 1.7–7.7)
Neutrophils Relative %: 44 %
Platelets: 411 10*3/uL — ABNORMAL HIGH (ref 150–400)
RBC: 3.46 MIL/uL — ABNORMAL LOW (ref 4.22–5.81)
RDW: 20.5 % — ABNORMAL HIGH (ref 11.5–15.5)
WBC: 9.4 10*3/uL (ref 4.0–10.5)
nRBC: 1 % — ABNORMAL HIGH (ref 0.0–0.2)

## 2024-04-21 LAB — TROPONIN T, HIGH SENSITIVITY
Troponin T High Sensitivity: <15 ng/L (ref <19)
Troponin T High Sensitivity: <15 ng/L (ref <19)

## 2024-04-21 MED ORDER — OXYCODONE HCL 10 MG PO TABS: 10.0000 mg | ORAL_TABLET | ORAL | 0 refills | Status: DC | PRN

## 2024-04-21 MED ORDER — KETOROLAC TROMETHAMINE 15 MG/ML IJ SOLN
15.0000 mg | Freq: Once | INTRAMUSCULAR | Status: AC
  Administered 2024-04-21: 15 mg via INTRAVENOUS
  Filled 2024-04-21: qty 1

## 2024-04-21 MED ORDER — OXYCODONE HCL 5 MG PO TABS
10.0000 mg | ORAL_TABLET | Freq: Once | ORAL | Status: AC
  Administered 2024-04-21: 10 mg via ORAL
  Filled 2024-04-21: qty 2

## 2024-04-21 NOTE — ED Triage Notes (Addendum)
 Chest pain , left arm and left leg pain  x 2 days. Sickle cell crisis . Denies shortness of breath

## 2024-04-21 NOTE — Discharge Instructions (Signed)
 As discussed, your workup today was overall reassuring.  It does not appear like you are having a heart attack.  Chest x-ray did not show obvious pneumonia.  Does not also appear like you are having acute chest syndrome or aplastic crisis.  Will send you home with pain medicine to use as needed.  Recommend follow-up with the sickle cell clinic for evaluation.  Please do not hesitate to return if the worrisome signs and symptoms we discussed become apparent.

## 2024-04-21 NOTE — Telephone Encounter (Signed)
 Please advise [facility]

## 2024-04-21 NOTE — Telephone Encounter (Signed)
 Patient currently at [facility] ER due to his pain. Patient inquiring if rx could be expedited so that he may have the rx once he is out of the ER.

## 2024-04-21 NOTE — Telephone Encounter (Signed)
 Chief Complaint: chest pain Symptoms: chest pain, left leg and arm pain Frequency: 3 days Pertinent Negatives: Patient denies sob Disposition: [x] ED /[] Urgent Care (no appt availability in office) / [] Appointment(In office/virtual)/ []  [place] [facility]/ [] Home Care/ [] Refused Recommended Disposition /[] [place] [facility]/ [x]  Follow-up with PCP  Additional Notes: Pt states chest pain for a few days and this morning he woke up with his left leg and arm hurting. States pain in center of chest 3/10 and sharp. States he has had this same sickle cell pain before. States that he has wanting to be seen at the day hospital due to him being out of his pain medication. NT attempted to contact [facility] , line just ringing then says not available. Pt instructed to go to ED if he has not heard from [facility] in next 30 minutes.   Copied from CRM [id number]. Topic: Clinical - Red Word Triage >> Apr 21, 2024  9:02 AM [place] B wrote: [facility] that prompted transfer to Nurse Triage: pt has severe pain in chest and left leg and it starting his arm Reason for Disposition . [1] Chest pain lasts > 5 minutes AND [2] occurred in past 3 days (72 hours) (Exception: Feels exactly the same as previously diagnosed heartburn and has accompanying sour taste in mouth.)  Protocols used: Chest Pain-A-AH

## 2024-04-21 NOTE — ED Provider Notes (Signed)
 [place] [facility] AT [facility] Provider Note   CSN: [id number] Arrival date & time: 04/21/24  1058     History  Chief Complaint  Patient presents with   Chest Pain    [name] Hill is a 23 y.o. male.   Chest Pain   23 year old male presents emergency department complaints of chest pain, left arm pain, left leg pain.  States he has had symptoms for the past couple of days.  States that he typically is on oxycodone  as needed when he has "sickle cell flareups."  States that his current symptoms are typical of other times that he has had sickle cell pain in the past.  Denies any known fall/trauma/injuries.  Denies any shortness of breath.  Has taken ibuprofen  at home without significant improvement of symptoms.  Try to get in touch with the sickle cell clinic but no one answered the phone so he was told by his primary care to come to the emergency department for further assessment/evaluation.  Patient currently states his symptoms are "very mild."  Denies any fevers, chills, abdominal pain, nausea, vomiting. Past medical history significant for sickle cell anemia, acute chest syndrome, pneumonia  Home Medications Prior to Admission medications   Medication Sig Start Date End Date Taking? Authorizing Provider  albuterol  (VENTOLIN  HFA) 108 (90 Base) MCG/ACT inhaler Inhale 2 puffs into the lungs every 6 (six) hours as needed for wheezing or shortness of breath. 10/08/22   Sigurd Driver, FNP  folic acid  (FOLVITE ) 1 MG tablet Take 1 tablet (1 mg total) by mouth daily. 12/10/23   Paseda, Folashade R, FNP  ibuprofen  (ADVIL ) 800 MG tablet Take 1 tablet (800 mg total) by mouth every 8 (eight) hours as needed. 10/07/23   Paseda, Folashade R, FNP  losartan  (COZAAR ) 50 MG tablet Take 1 tablet (50 mg total) by mouth daily. 10/07/23   Paseda, Folashade R, FNP  montelukast  (SINGULAIR ) 10 MG tablet Take 1 tablet (10 mg total) by mouth at bedtime. Patient taking differently:  Take 10 mg by mouth at bedtime as needed. 09/11/23 09/10/24  Jerrlyn Morel, NP  Oxycodone  HCl 10 MG TABS Take 1 tablet (10 mg total) by mouth every 4 (four) hours as needed (pain). 02/09/24   Paseda, Folashade R, FNP  Vitamin D , Ergocalciferol , (DRISDOL ) 1.25 MG (50000 UNIT) CAPS capsule Take 1 capsule (50,000 Units total) by mouth every 7 (seven) days. 10/08/23   Paseda, Folashade R, FNP      Allergies    Lisinopril , Pork-derived products, and Shellfish allergy    Review of Systems   Review of Systems  Cardiovascular:  Positive for chest pain.  All other systems reviewed and are negative.   Physical Exam Updated Vital Signs BP 128/78   Pulse 79   Temp 98 F (36.7 C) (Oral)   Resp 18   Wt 88 kg   SpO2 95%   BMI 30.38 kg/m  Physical Exam Vitals and nursing note reviewed.  Constitutional:      General: He is not in acute distress.    Appearance: He is well-developed.  HENT:     Head: Normocephalic and atraumatic.  Eyes:     Conjunctiva/sclera: Conjunctivae normal.  Cardiovascular:     Rate and Rhythm: Normal rate and regular rhythm.     Pulses: Normal pulses.     Heart sounds: No murmur heard. Pulmonary:     Effort: Pulmonary effort is normal. No respiratory distress.     Breath sounds: Normal  breath sounds. No wheezing, rhonchi or rales.  Abdominal:     Palpations: Abdomen is soft.     Tenderness: There is no abdominal tenderness.  Musculoskeletal:        General: No swelling.     Cervical back: Neck supple.     Comments: Full range of motion of bilateral upper lower extremities.  Radial and posterior tibial pulses and pedal pulses 2+ bilaterally.  No overlying erythema, palpable flexion/induration of patient's left upper and lower extremity.  No upper or lower extremity edema.  Skin:    General: Skin is warm and dry.     Capillary Refill: Capillary refill takes less than 2 seconds.  Neurological:     Mental Status: He is alert.  Psychiatric:        Mood and  Affect: Mood normal.    ED Results / Procedures / Treatments   Labs (all labs ordered are listed, but only abnormal results are displayed) Labs Reviewed  CBC WITH DIFFERENTIAL/PLATELET - Abnormal; Notable for the following components:      Result Value   RBC 3.46 (*)    Hemoglobin 10.2 (*)    HCT 28.0 (*)    MCHC 36.4 (*)    RDW 20.5 (*)    Platelets 411 (*)    nRBC 1.0 (*)    All other components within normal limits  RETICULOCYTES - Abnormal; Notable for the following components:   Retic Ct Pct 5.8 (*)    RBC. 3.47 (*)    Retic Count, Absolute 202.6 (*)    Immature Retic Fract 33.7 (*)    All other components within normal limits  COMPREHENSIVE METABOLIC PANEL WITH GFR - Abnormal; Notable for the following components:   AST 60 (*)    Total Bilirubin 2.8 (*)    All other components within normal limits  TROPONIN T, HIGH SENSITIVITY  TROPONIN T, HIGH SENSITIVITY    EKG None  Radiology No results found.  Procedures Procedures    Medications Ordered in ED Medications  oxyCODONE  (Oxy IR/ROXICODONE ) immediate release tablet 10 mg (10 mg Oral Given 04/21/24 1221)  ketorolac  (TORADOL ) 15 MG/ML injection 15 mg (15 mg Intravenous Given 04/21/24 1223)    ED Course/ Medical Decision Making/ A&P                                 Medical Decision Making Amount and/or Complexity of Data Reviewed Labs: ordered. Radiology: ordered.  Risk Prescription drug management.   This patient presents to the ED for concern of chest pain, left leg pain, left arm pain, this involves an extensive number of treatment options, and is a complaint that carries with it a high risk of complications and morbidity.  The differential diagnosis includes sickle cell crisis, ACS, PE, pneumothorax, pneumonia, acute chest syndrome, aplastic crisis, ischemic limb, DVT, cellulitis, erysipelas, necrotizing infection, septic arthritis, other   Co morbidities that complicate the patient evaluation  See  HPI   Additional history obtained:  Additional history obtained from EMR External records from outside source obtained and reviewed including hospital records   Lab Tests:  I Ordered, and personally interpreted labs.  The pertinent results include: No leukocytosis.  Anemia with a hemoglobin of 10.2 L which is normocytic.  Reticulocyte count, immature reticulocyte count 202.6, 33.7%.  No electrolyte abnormalities.  Nonspecific elevation of AST of 60.  No renal dysfunction.  Total albumin up of 2.8 of which patient  seems to have chronic elevation with this number being improved from baseline from prior labs.  Troponin of less than 15 with repeat less than 15   Imaging Studies ordered:  I ordered imaging studies including chest x-ray I independently visualized and interpreted imaging which showed no acute cardiopulmonary abnormality I agree with the radiologist interpretation   Cardiac Monitoring: / EKG:  The patient was maintained on a cardiac monitor.  I personally viewed and interpreted the cardiac monitored which showed an underlying rhythm of: Sinus rhythm.  Borderline PR lengthening.  Nonspecific for ST/T changes.   Consultations Obtained:  N/a   Problem List / ED Course / Critical interventions / Medication management  Sickle cell anemia with pain.  Chest pain I ordered medication including oxycodone , Toradol    Reevaluation of the patient after these medicines showed that the patient improved I have reviewed the patients home medicines and have made adjustments as needed   Social Determinants of Health:  Denies tobacco or illicit drug use.   Test / Admission - Considered:  Sickle cell anemia with pain.  Chest pain Vitals signs  within normal range and stable throughout visit. Laboratory/imaging studies significant for: See above 23 year old male presents emergency department complaints of chest pain, left arm pain, left leg pain.  States he has had symptoms for  the past couple of days.  States that he typically is on oxycodone  as needed when he has "sickle cell flareups."  States that his current symptoms are typical of other times that he has had sickle cell pain in the past.  Denies any known fall/trauma/injuries.  Denies any shortness of breath.  Has taken ibuprofen  at home without significant improvement of symptoms.  Try to get in touch with the sickle cell clinic but no one answered the phone so he was told by his primary care to come to the emergency department for further assessment/evaluation.  Patient currently states his symptoms are "very mild."  Denies any fevers, chills, abdominal pain, nausea, vomiting. On exam medical initially, patient appearing comfortable laying on his left side playing on his phone.  Close clear to auscultation bilaterally.  No chest wall tenderness.  Workup today reassuring.  Patient with delta negative troponin, nonspecific changes on EKG; low suspicion for ACS.  Patient without tachycardia, tachypnea, hypoxia without risk factors for PE other than baseline sickle cell anemia.  Chest pain reported similar to prior episodes of sickle cell pain; lower suspicion for PE.  Patient without chest pain rating back, pulse deficits, neurodeficits, hypertension; low suspicion for aortic dissection.  No evidence of acute chest syndrome.  Chest x-ray without obvious pneumonia, pneumothorax or other acute cardiopulmonary abnormality.  Suspect the patient's chest pain likely secondary to sickle cell anemia.  Labs reassuring without evidence of aplastic crisis.  Patient's pain controlled with oral oxycodone .  Patient not on chronic opiate medication but seems to only take opiate medication whenever he has breakthrough sickle cell pain.  Will send patient home with oxycodone  as he has had this in the past and has not had a refilled for 2 months and recommend follow-up with sickle cell status.  Treatment plan discussed with patient and he acknowledged  understanding was agreeable to said plan.  Patient overall well-appearing, afebrile in no acute distress. Worrisome signs and symptoms were discussed with the patient, and the patient acknowledged understanding to return to the ED if noticed. Patient was stable upon discharge. '        Final Clinical Impression(s) / ED Diagnoses  Final diagnoses:  None    Rx / DC Orders ED Discharge Orders     None         [place] Butter, [place] 04/21/24 1619    Scarlette Currier, MD 04/22/24 [phone]

## 2024-04-23 ENCOUNTER — Other Ambulatory Visit: Payer: Self-pay | Admitting: Nurse Practitioner

## 2024-04-23 DIAGNOSIS — G894 Chronic pain syndrome: Secondary | ICD-10-CM

## 2024-04-23 DIAGNOSIS — D571 Sickle-cell disease without crisis: Secondary | ICD-10-CM

## 2024-04-23 MED ORDER — OXYCODONE HCL 10 MG PO TABS: 10.0000 mg | ORAL_TABLET | ORAL | 0 refills | Status: DC | PRN

## 2024-04-23 NOTE — Progress Notes (Signed)
 Reviewed PDMP substance reporting system prior to prescribing opiate medications. No inconsistencies noted.   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  Dispense: 60 tablet; Refill: 0  2. Chronic pain syndrome  - Oxycodone HCl 10 MG TABS; Take 1 tablet (10 mg total) by mouth every 4 (four) hours as needed (pain).  Dispense: 60 tablet; Refill: 0

## 2024-05-26 ENCOUNTER — Ambulatory Visit: Payer: Self-pay | Admitting: Nurse Practitioner

## 2024-06-19 ENCOUNTER — Emergency Department (HOSPITAL_BASED_OUTPATIENT_CLINIC_OR_DEPARTMENT_OTHER)

## 2024-06-19 ENCOUNTER — Emergency Department (HOSPITAL_BASED_OUTPATIENT_CLINIC_OR_DEPARTMENT_OTHER)
Admission: EM | Admit: 2024-06-19 | Discharge: 2024-06-19 | Disposition: A | Discharge status: Home / Self Care | Attending: Emergency Medicine | Admitting: Emergency Medicine

## 2024-06-19 ENCOUNTER — Encounter (HOSPITAL_BASED_OUTPATIENT_CLINIC_OR_DEPARTMENT_OTHER): Payer: Self-pay | Admitting: Emergency Medicine

## 2024-06-19 ENCOUNTER — Other Ambulatory Visit: Payer: Self-pay

## 2024-06-19 DIAGNOSIS — D5700 Hb-SS disease with crisis, unspecified: Secondary | ICD-10-CM | POA: Insufficient documentation

## 2024-06-19 DIAGNOSIS — G894 Chronic pain syndrome: Secondary | ICD-10-CM

## 2024-06-19 DIAGNOSIS — D571 Sickle-cell disease without crisis: Secondary | ICD-10-CM

## 2024-06-19 LAB — COMPREHENSIVE METABOLIC PANEL WITH GFR
ALT: 29 U/L (ref 0–44)
AST: 41 U/L (ref 15–41)
Albumin: 4 g/dL (ref 3.5–5.0)
Alkaline Phosphatase: 73 U/L (ref 38–126)
Anion gap: 10 (ref 5–15)
BUN: 6 mg/dL (ref 6–20)
CO2: 22 mmol/L (ref 22–32)
Calcium: 8.3 mg/dL — ABNORMAL LOW (ref 8.9–10.3)
Chloride: 110 mmol/L (ref 98–111)
Creatinine, Ser: 0.73 mg/dL (ref 0.61–1.24)
GFR, Estimated: >60 mL/min (ref >60)
Glucose, Bld: 108 mg/dL — ABNORMAL HIGH (ref 70–99)
Potassium: 4.1 mmol/L (ref 3.5–5.1)
Sodium: 142 mmol/L (ref 135–145)
Total Bilirubin: 4.1 mg/dL — ABNORMAL HIGH (ref 0.0–1.2)
Total Protein: 5.7 g/dL — ABNORMAL LOW (ref 6.5–8.1)

## 2024-06-19 LAB — CBC WITH DIFFERENTIAL/PLATELET
Abs Immature Granulocytes: 0.39 K/uL — ABNORMAL HIGH (ref 0.00–0.07)
Basophils Absolute: 0.2 K/uL — ABNORMAL HIGH (ref 0.0–0.1)
Basophils Relative: 1 %
Eosinophils Absolute: 0.2 K/uL (ref 0.0–0.5)
Eosinophils Relative: 2 %
HCT: 24.1 % — ABNORMAL LOW (ref 39.0–52.0)
Hemoglobin: 9 g/dL — ABNORMAL LOW (ref 13.0–17.0)
Immature Granulocytes: 3 %
Lymphocytes Relative: 44 %
Lymphs Abs: 5.1 K/uL — ABNORMAL HIGH (ref 0.7–4.0)
MCH: 30 pg (ref 26.0–34.0)
MCHC: 37.3 g/dL — ABNORMAL HIGH (ref 30.0–36.0)
MCV: 80.3 fL (ref 80.0–100.0)
Monocytes Absolute: 1.2 K/uL — ABNORMAL HIGH (ref 0.1–1.0)
Monocytes Relative: 11 %
Neutro Abs: 4.5 K/uL (ref 1.7–7.7)
Neutrophils Relative %: 39 %
Platelets: 376 K/uL (ref 150–400)
RBC: 3 MIL/uL — ABNORMAL LOW (ref 4.22–5.81)
RDW: 21.2 % — ABNORMAL HIGH (ref 11.5–15.5)
Smear Review: NORMAL
WBC: 11.5 K/uL — ABNORMAL HIGH (ref 4.0–10.5)
nRBC: 0.7 % — ABNORMAL HIGH (ref 0.0–0.2)

## 2024-06-19 LAB — RETICULOCYTES
Immature Retic Fract: 42.9 % — ABNORMAL HIGH (ref 2.3–15.9)
RBC.: 3.08 MIL/uL — ABNORMAL LOW (ref 4.22–5.81)
Retic Count, Absolute: 164.5 K/uL (ref 19.0–186.0)
Retic Ct Pct: 5.3 % — ABNORMAL HIGH (ref 0.4–3.1)

## 2024-06-19 LAB — LACTIC ACID, PLASMA: Lactic Acid, Venous: 1.2 mmol/L (ref 0.5–1.9)

## 2024-06-19 MED ORDER — HYDROMORPHONE HCL 1 MG/ML IJ SOLN
1.0000 mg | Freq: Once | INTRAMUSCULAR | Status: AC
  Administered 2024-06-19: 1 mg via INTRAVENOUS
  Filled 2024-06-19: qty 1

## 2024-06-19 MED ORDER — ONDANSETRON HCL 4 MG/2ML IJ SOLN
4.0000 mg | Freq: Once | INTRAMUSCULAR | Status: AC
  Administered 2024-06-19: 4 mg via INTRAVENOUS
  Filled 2024-06-19: qty 2

## 2024-06-19 MED ORDER — SODIUM CHLORIDE 0.9 % IV SOLN
500.0000 mg | Freq: Once | INTRAVENOUS | Status: AC
  Administered 2024-06-19: 500 mg via INTRAVENOUS
  Filled 2024-06-19: qty 5

## 2024-06-19 MED ORDER — IOHEXOL 350 MG/ML SOLN
100.0000 mL | Freq: Once | INTRAVENOUS | Status: AC | PRN
  Administered 2024-06-19: 100 mL via INTRAVENOUS

## 2024-06-19 MED ORDER — OXYCODONE HCL 10 MG PO TABS
10.0000 mg | ORAL_TABLET | ORAL | 0 refills | Status: DC | PRN
Start: 2024-06-19 — End: 2024-07-06

## 2024-06-19 MED ORDER — DIPHENHYDRAMINE HCL 50 MG/ML IJ SOLN
12.5000 mg | Freq: Once | INTRAMUSCULAR | Status: AC
  Administered 2024-06-19: 12.5 mg via INTRAVENOUS
  Filled 2024-06-19: qty 1

## 2024-06-19 MED ORDER — SODIUM CHLORIDE 0.9 % IV BOLUS
1000.0000 mL | Freq: Once | INTRAVENOUS | Status: AC
  Administered 2024-06-19: 1000 mL via INTRAVENOUS

## 2024-06-19 MED ORDER — SODIUM CHLORIDE 0.9 % IV SOLN
2.0000 g | Freq: Once | INTRAVENOUS | Status: AC
  Administered 2024-06-19: 2 g via INTRAVENOUS
  Filled 2024-06-19: qty 20

## 2024-06-19 NOTE — ED Notes (Signed)
 Transported to CT

## 2024-06-19 NOTE — ED Provider Notes (Signed)
 [place] [facility] Provider Note   CSN: [id number] Arrival date & time: 06/19/24  9180     Patient presents with: Sickle Cell Pain Crisis   [name] Hill is a 23 y.o. male.   Patient here with sickle cell crisis pain.  Pain in the chest pain the low back pain in the thighs.  Takes oxycodone  as needed for breakthrough pain at home.  Has not had a prescription for this.  Pains been well-controlled for a while.  Symptoms started this morning.  Denies any fever cough sputum production.  Denies any weakness numbness tingling.  Denies any headache vision loss.  The history is provided by the patient.       Prior to Admission medications   Medication Sig Start Date End Date Taking? Authorizing Provider  albuterol  (VENTOLIN  HFA) 108 (90 Base) MCG/ACT inhaler Inhale 2 puffs into the lungs every 6 (six) hours as needed for wheezing or shortness of breath. 10/08/22   Tilford Bertram CHRISTELLA, FNP  folic acid  (FOLVITE ) 1 MG tablet Take 1 tablet (1 mg total) by mouth daily. 12/10/23   Paseda, Folashade R, FNP  ibuprofen  (ADVIL ) 800 MG tablet Take 1 tablet (800 mg total) by mouth every 8 (eight) hours as needed. 10/07/23   Paseda, Folashade R, FNP  losartan  (COZAAR ) 50 MG tablet Take 1 tablet (50 mg total) by mouth daily. 10/07/23   Paseda, Folashade R, FNP  montelukast  (SINGULAIR ) 10 MG tablet Take 1 tablet (10 mg total) by mouth at bedtime. Patient not taking: Reported on 04/21/2024 09/11/23 09/10/24  Oley Bascom RAMAN, NP  Oxycodone  HCl 10 MG TABS Take 1 tablet (10 mg total) by mouth every 4 (four) hours as needed (pain). 04/28/24   Paseda, Folashade R, FNP  Vitamin D , Ergocalciferol , (DRISDOL ) 1.25 MG (50000 UNIT) CAPS capsule Take 1 capsule (50,000 Units total) by mouth every 7 (seven) days. Patient taking differently: Take 50,000 Units by mouth every 7 (seven) days. Mondays 10/08/23   Paseda, Folashade R, FNP    Allergies: Lisinopril , Pork-derived products, and  Shellfish allergy    Review of Systems  Updated Vital Signs BP 118/76   Pulse 76   Temp 98.1 F (36.7 C) (Oral)   Resp 17   SpO2 100%   Physical Exam Vitals and nursing note reviewed.  Constitutional:      General: He is not in acute distress.    Appearance: He is well-developed. He is ill-appearing.  HENT:     Head: Normocephalic and atraumatic.     Nose: Nose normal.     Mouth/Throat:     Mouth: Mucous membranes are moist.  Eyes:     Extraocular Movements: Extraocular movements intact.     Conjunctiva/sclera: Conjunctivae normal.     Pupils: Pupils are equal, round, and reactive to light.  Cardiovascular:     Rate and Rhythm: Normal rate and regular rhythm.     Pulses: Normal pulses.     Heart sounds: Normal heart sounds. No murmur heard. Pulmonary:     Effort: Pulmonary effort is normal. No respiratory distress.     Breath sounds: Normal breath sounds.  Abdominal:     Palpations: Abdomen is soft.     Tenderness: There is no abdominal tenderness.  Musculoskeletal:        General: No swelling.     Cervical back: Normal range of motion and neck supple.  Skin:    General: Skin is warm and dry.  Capillary Refill: Capillary refill takes less than 2 seconds.  Neurological:     General: No focal deficit present.     Mental Status: He is alert and oriented to person, place, and time.     Cranial Nerves: No cranial nerve deficit.     Sensory: No sensory deficit.     Motor: No weakness.     Coordination: Coordination normal.  Psychiatric:        Mood and Affect: Mood normal.     (all labs ordered are listed, but only abnormal results are displayed) Labs Reviewed  CBC WITH DIFFERENTIAL/PLATELET - Abnormal; Notable for the following components:      Result Value   WBC 11.5 (*)    RBC 3.00 (*)    Hemoglobin 9.0 (*)    HCT 24.1 (*)    MCHC 37.3 (*)    RDW 21.2 (*)    nRBC 0.7 (*)    Lymphs Abs 5.1 (*)    Monocytes Absolute 1.2 (*)    Basophils Absolute 0.2  (*)    Abs Immature Granulocytes 0.39 (*)    All other components within normal limits  RETICULOCYTES - Abnormal; Notable for the following components:   Retic Ct Pct 5.3 (*)    RBC. 3.08 (*)    Immature Retic Fract 42.9 (*)    All other components within normal limits  COMPREHENSIVE METABOLIC PANEL WITH GFR - Abnormal; Notable for the following components:   Glucose, Bld 108 (*)    Calcium  8.3 (*)    Total Protein 5.7 (*)    Total Bilirubin 4.1 (*)    All other components within normal limits  CULTURE, BLOOD (ROUTINE X 2)  CULTURE, BLOOD (ROUTINE X 2)  LACTIC ACID, PLASMA    EKG: EKG Interpretation Date/Time:  Saturday June 19 2024 08:26:58 EDT Ventricular Rate:  86 PR Interval:  220 QRS Duration:  98 QT Interval:  365 QTC Calculation: 437 R Axis:   57  Text Interpretation: Sinus rhythm Prolonged PR interval Confirmed by Ruthe Cornet [phone]) on 06/19/2024 8:43:55 AM  Radiology: CT Angio Chest PE W and/or Wo Contrast Result Date: 06/19/2024 CLINICAL DATA:  Concern for pulmonary embolism. EXAM: CT ANGIOGRAPHY CHEST WITH CONTRAST TECHNIQUE: Multidetector CT imaging of the chest was performed using the standard protocol during bolus administration of intravenous contrast. Multiplanar CT image reconstructions and MIPs were obtained to evaluate the vascular anatomy. RADIATION DOSE REDUCTION: This exam was performed according to the departmental dose-optimization program which includes automated exposure control, adjustment of the mA and/or kV according to patient size and/or use of iterative reconstruction technique. CONTRAST:  OMNIPAQUE  IOHEXOL  350 MG/ML SOLN COMPARISON:  Chest radiograph dated 06/19/2024 and CT dated 09/12/2023. FINDINGS: Cardiovascular: There is mild cardiomegaly. No pericardial effusion. The thoracic aorta is unremarkable. There is dilatation of the main pulmonary trunk suggestive of pulmonary hypertension. Clinical correlation recommended. Evaluation of the  pulmonary arteries is limited due to suboptimal opacification. No pulmonary artery embolus identified. Mediastinum/Nodes: No hilar or mediastinal adenopathy. The esophagus and the thyroid gland are grossly unremarkable. No mediastinal fluid collection. Lungs/Pleura: There are bibasilar atelectasis. No focal consolidation, pleural effusion, pneumothorax. The central airways are patent. Upper Abdomen: Gallstones. 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 No acute osseous pathology. Review of the MIP images confirms the above findings. IMPRESSION: 1. No CT evidence of pulmonary artery embolus. 2. Mild cardiomegaly. 3. Dilatation of  the main pulmonary trunk suggestive of pulmonary hypertension. 4. Cholelithiasis. Electronically Signed   By: Vanetta Chou M.D.   On: 06/19/2024 11:47   DG Chest Portable 1 View Result Date: 06/19/2024 CLINICAL DATA:  Sickle cell crisis.  Chest pain. EXAM: PORTABLE CHEST 1 VIEW COMPARISON:  04/21/2024 FINDINGS: The cardio pericardial silhouette is enlarged. No pulmonary edema or pleural effusion. Right lung clear. Small focus of airspace opacity identified left lung base suspicious for pneumonia. Telemetry leads overlie the chest. IMPRESSION: Small focus of airspace opacity left lung base suspicious for pneumonia. Electronically Signed   By: Camellia Candle M.D.   On: 06/19/2024 09:00     Procedures   Medications Ordered in the ED  HYDROmorphone  (DILAUDID ) injection 1 mg (has no administration in time range)  sodium chloride  0.9 % bolus 1,000 mL (0 mLs Intravenous Stopped 06/19/24 1019)  HYDROmorphone  (DILAUDID ) injection 1 mg (1 mg Intravenous Given 06/19/24 0853)  diphenhydrAMINE  (BENADRYL ) injection 12.5 mg (12.5 mg Intravenous Given 06/19/24 0852)  cefTRIAXone  (ROCEPHIN ) 2 g in sodium chloride  0.9 % 100 mL IVPB (0 g Intravenous Stopped  06/19/24 1024)  azithromycin  (ZITHROMAX ) 500 mg in sodium chloride  0.9 % 250 mL IVPB (500 mg Intravenous New Bag/Given 06/19/24 1024)  HYDROmorphone  (DILAUDID ) injection 1 mg (1 mg Intravenous Given 06/19/24 1019)  ondansetron  (ZOFRAN ) injection 4 mg (4 mg Intravenous Given 06/19/24 1038)  iohexol  (OMNIPAQUE ) 350 MG/ML injection 100 mL (100 mLs Intravenous Contrast Given 06/19/24 1100)                                    Medical Decision Making Amount and/or Complexity of Data Reviewed Labs: ordered. Radiology: ordered.  Risk Prescription drug management.   Dolphus CHRISTELLA Sor is here with sickle cell crisis pain.  Normal vitals.  No fever.  Differential diagnosis crisis pain versus less likely acute chest.  EKG shows sinus rhythm.  No ischemic changes.  Reassuring vital signs.  Pain started this morning.  He takes oxycodone  as needed.  Does not have very frequent pain crisis.  He does not have a current prescription for his breakthrough pain medicine.  Was unable to take it this morning.  Ultimately we will check basic labs give him IV fluids IV Dilaudid  and IV Benadryl  and reevaluate.  Will get chest x-ray.  Lab work shows a white count of 11.5 but otherwise unremarkable labs.  Chest x-ray concerning for possible pneumonia/acute chest syndrome v atelectasis.  Will get a PE scan give some IV antibiotics check blood cultures.    Ultimately CT scan showed no evidence of pulmonary emboli.  Some mild cardiomegaly but there was no evidence of focal consolidation pleural effusion or pneumothorax.  There were bibasilar atelectasis.  Lactic acid was normal.  Lab work otherwise unremarkable.  Having originally placed him on some oxygen  which I suspect this may be due to some sedation from the pain meds.  Off oxygen  his room air is normal.  I discussed with him all the findings today.  Ultimately he was feeling better after pain meds.  Have a lower suspicion for acute chest given the normal CT scan now of his  chest.  Vital signs are reassuring.  No fever.  His pains improved.  His labs are at baseline.  Ultimately patient decided to go home which I think is reasonable.  Blood cultures have been sent.  Return precautions given.  I have prescribed his breakthrough  narcotic pain medicine for home.  Discharged in good condition.  This chart was dictated using voice recognition software.  Despite best efforts to proofread,  errors can occur which can change the documentation meaning.      Final diagnoses:  Sickle cell pain crisis [facility])    ED Discharge Orders     None          Ruthe Cornet, DO 06/19/24 1201

## 2024-06-19 NOTE — ED Notes (Signed)
 RA SAT was around 90%, patient stated he has had to be placed on oxygen  before during a crisis. Placed on [facility]

## 2024-06-19 NOTE — Discharge Instructions (Addendum)
 Take your narcotic pain medicine for breakthrough pain.  Return if symptoms worsen.  If you develop a fever greater than 100.4 you should return for evaluation.  Otherwise follow-up with your primary care doctor.

## 2024-06-19 NOTE — ED Notes (Signed)
CMP redrawn  

## 2024-06-19 NOTE — ED Triage Notes (Signed)
 Chest, back and extremity pain this am

## 2024-06-19 NOTE — ED Notes (Signed)
 D/c paperwork reviewed with pt, including prescriptions and follow up care.  All questions and/or concerns addressed at time of d/c.  No further needs expressed. . Pt verbalized understanding, Ambulatory without assistance to ED exit, NAD.

## 2024-06-19 NOTE — ED Notes (Signed)
 RT attempted ambulatory pulse ox. SAT 88% in beginning and dropped to 82% during walk. No SOB noted. Placed patient back on 2L in room. MD aware

## 2024-06-19 NOTE — ED Notes (Signed)
 Pt provided work note, per his request. No further needs expressed.

## 2024-06-24 LAB — CULTURE, BLOOD (ROUTINE X 2)
Culture: NO GROWTH
Culture: NO GROWTH
Special Requests: ADEQUATE
Special Requests: ADEQUATE

## 2024-07-06 ENCOUNTER — Other Ambulatory Visit: Payer: Self-pay | Admitting: Nurse Practitioner

## 2024-07-06 DIAGNOSIS — D571 Sickle-cell disease without crisis: Secondary | ICD-10-CM

## 2024-07-06 DIAGNOSIS — G894 Chronic pain syndrome: Secondary | ICD-10-CM

## 2024-07-06 MED ORDER — OXYCODONE HCL 10 MG PO TABS
10.0000 mg | ORAL_TABLET | ORAL | 0 refills | Status: DC | PRN
Start: 2024-07-06 — End: 2024-08-04

## 2024-07-06 NOTE — Telephone Encounter (Unsigned)
 Copied from CRM #[id number]. Topic: Clinical - Medication Refill >> Jul 06, 2024  3:32 PM Turkey B wrote: Medication: Oxycodone  HCl 10 MG TABS  Has the patient contacted their pharmacy? No because of type of medicine   This is the patient's preferred pharmacy:  [facility] [address] [place], [place] [address] [phone] Is this the correct pharmacy for this prescription? yes    Has the prescription been filled recently? no  Is the patient out of the medication? yes  Has the patient been seen for an appointment in the last year OR does the patient have an upcoming appointment? yes  Can we respond through MyChart? yes  Agent: Please be advised that Rx refills may take up to 3 business days. We ask that you follow-up with your pharmacy.

## 2024-08-04 ENCOUNTER — Other Ambulatory Visit: Payer: Self-pay

## 2024-08-04 DIAGNOSIS — D571 Sickle-cell disease without crisis: Secondary | ICD-10-CM

## 2024-08-04 DIAGNOSIS — G894 Chronic pain syndrome: Secondary | ICD-10-CM

## 2024-08-04 MED ORDER — OXYCODONE HCL 10 MG PO TABS: 10.0000 mg | ORAL_TABLET | ORAL | 0 refills | Status: DC | PRN

## 2024-08-04 NOTE — Telephone Encounter (Signed)
 Copied from CRM [id number]. Topic: Clinical - Prescription Issue >> Aug 04, 2024  2:22 PM [name] Hill wrote: Reason for CRM: patient calling to ask if the provider could fill the prescription for Oxycodone  HCl 10 MG TABS The patient states this was only a 7 day supply that she filled last time and he is out of medication   Pt num [phone] (M)

## 2024-08-04 NOTE — Telephone Encounter (Signed)
 Please advise [facility]

## 2024-08-30 ENCOUNTER — Ambulatory Visit: Payer: Self-pay | Admitting: Nurse Practitioner

## 2024-09-02 ENCOUNTER — Other Ambulatory Visit: Payer: Self-pay | Admitting: Nurse Practitioner

## 2024-09-02 DIAGNOSIS — G894 Chronic pain syndrome: Secondary | ICD-10-CM

## 2024-09-02 DIAGNOSIS — D571 Sickle-cell disease without crisis: Secondary | ICD-10-CM

## 2024-09-02 NOTE — Telephone Encounter (Unsigned)
 Copied from CRM [id number]. Topic: Clinical - Medication Refill >> Sep 02, 2024  3:45 PM Emylou G wrote: Medication: Oxycodone  HCl 10 MG TABS  Has the patient contacted their pharmacy? Yes  (Agent: If no, request that the patient contact the pharmacy for the refill. If patient does not wish to contact the pharmacy document the reason why and proceed with request.) (Agent: If yes, when and what did the pharmacy advise?) said to call us   This is the patient's preferred pharmacy:  [id number] - [place], [place] - [address] AT [facility] OF [address] [place] [address] Phone: [phone] Fax: [phone]  Is this the correct pharmacy for this prescription? Yes If no, delete pharmacy and type the correct one.   Has the prescription been filled recently? No  Is the patient out of the medication? Yes  Has the patient been seen for an appointment in the last year OR does the patient have an upcoming appointment? No ( missed last 4? )  Can we respond through MyChart? Yes  Agent: Please be advised that Rx refills may take up to 3 business days. We ask that you follow-up with your pharmacy.

## 2024-09-03 MED ORDER — OXYCODONE HCL 10 MG PO TABS: 10.0000 mg | ORAL_TABLET | ORAL | 0 refills | Status: DC | PRN

## 2024-09-03 NOTE — Telephone Encounter (Signed)
 Please advise [facility]

## 2024-09-28 ENCOUNTER — Ambulatory Visit (INDEPENDENT_AMBULATORY_CARE_PROVIDER_SITE_OTHER): Payer: Self-pay | Admitting: Nurse Practitioner

## 2024-09-28 ENCOUNTER — Encounter: Payer: Self-pay | Admitting: Nurse Practitioner

## 2024-09-28 VITALS — BP 122/68 | HR 75 | Wt 202.0 lb

## 2024-09-28 DIAGNOSIS — G894 Chronic pain syndrome: Secondary | ICD-10-CM

## 2024-09-28 DIAGNOSIS — L709 Acne, unspecified: Secondary | ICD-10-CM | POA: Insufficient documentation

## 2024-09-28 DIAGNOSIS — E559 Vitamin D deficiency, unspecified: Secondary | ICD-10-CM | POA: Insufficient documentation

## 2024-09-28 DIAGNOSIS — D571 Sickle-cell disease without crisis: Secondary | ICD-10-CM | POA: Diagnosis not present

## 2024-09-28 DIAGNOSIS — Z0289 Encounter for other administrative examinations: Secondary | ICD-10-CM | POA: Insufficient documentation

## 2024-09-28 DIAGNOSIS — L700 Acne vulgaris: Secondary | ICD-10-CM

## 2024-09-28 DIAGNOSIS — I10 Essential (primary) hypertension: Secondary | ICD-10-CM

## 2024-09-28 DIAGNOSIS — R809 Proteinuria, unspecified: Secondary | ICD-10-CM

## 2024-09-28 NOTE — Assessment & Plan Note (Signed)
 Last vitamin D  Lab Results  Component Value Date   VD25OH 31.1 12/10/2023  Checking vitamin D  level Currently not on medication

## 2024-09-28 NOTE — Assessment & Plan Note (Signed)
 presenting as a painful lesion on the face.  - Apply warm compress to the lesion. - Avoid popping or scratching the lesion.

## 2024-09-28 NOTE — Assessment & Plan Note (Signed)
 Pain contract agreement signed today

## 2024-09-28 NOTE — Assessment & Plan Note (Signed)
Continue losartan 50 mg daily.  

## 2024-09-28 NOTE — Assessment & Plan Note (Addendum)
 DASH diet and commitment to daily physical activity for a minimum of 30 minutes discussed and encouraged, as a part of hypertension management. The importance of attaining a healthy weight is also discussed. Continue losartan  50 mg daily     09/28/2024   12:02 PM 09/28/2024   11:22 AM 06/19/2024   12:00 PM 06/19/2024   10:30 AM 06/19/2024    9:30 AM 06/19/2024    8:30 AM 04/21/2024    4:05 PM  BP/Weight  Systolic BP 122 135 113 118 124 135 123  Diastolic BP 68 84 68 76 78 81 64  Wt. (Lbs)  202       BMI  31.64 kg/m2

## 2024-09-28 NOTE — Assessment & Plan Note (Addendum)
 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 No recent crisis requiring hospitalization. Pain level currently at zero. - Continue ibuprofen  800 mg every 8 hours as needed for mild pain. - Continue oxycodone  10 mg every 4 hours as needed for moderate to severe pain. Continue folic acid  1 mg daily - Maintain hydration with at least 64 ounces of water  daily. - Avoid triggers of sickle cell crisis such as infections, extreme temperatures, and stress. Maintain close follow-up with hematologist at [facility]. - Terms of our  pain management contract reviewed and signed I encouraged to get Tdap vaccine at the pharmacy, declined flu vaccine in the office today Need for annual eye exam discussed with therapist - Schedule follow-up in 3 months.

## 2024-09-28 NOTE — Assessment & Plan Note (Signed)
 Continue ibuprofen  800 mg 3 times daily as needed mild pain, oxycodone  10 mg every 4 hours as needed moderate to severe pain

## 2024-09-28 NOTE — Patient Instructions (Addendum)
 Please come fasting to your next appointment  1. 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 Choose  clean, green, fresh or frozen over canned, processed or packaged foods which are more sugary, salty and fatty. 70 to 75% of food eaten should be vegetables and fruit. Three meals at set times with snacks allowed between meals, but they must be fruit or vegetables. Aim to eat over a 12 hour period , example 7 am to 7 pm, and STOP after  your last meal of the day. Drink water ,generally about 64 ounces per day, no other drink is as healthy. Fruit juice is best enjoyed in a healthy way, by EATING the fruit.  Thanks for choosing [facility] we consider it a privelige to serve you.
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
## 2024-09-29 ENCOUNTER — Other Ambulatory Visit: Payer: Self-pay | Admitting: Nurse Practitioner

## 2024-09-29 ENCOUNTER — Ambulatory Visit: Payer: Self-pay | Admitting: Nurse Practitioner

## 2024-09-29 DIAGNOSIS — E559 Vitamin D deficiency, unspecified: Secondary | ICD-10-CM

## 2024-09-29 LAB — CMP14+CBC/D/PLT+FER+RETIC+V...
ALT: 15 IU/L (ref 0–44)
AST: 38 IU/L (ref 0–40)
Albumin: 4.4 g/dL (ref 4.3–5.2)
Alkaline Phosphatase: 71 IU/L (ref 47–123)
BUN/Creatinine Ratio: 9 (ref 9–20)
BUN: 6 mg/dL (ref 6–20)
Basophils Absolute: 0.2 x10E3/uL (ref 0.0–0.2)
Basos: 2 %
Bilirubin Total: 3.1 mg/dL — ABNORMAL HIGH (ref 0.0–1.2)
CO2: 22 mmol/L (ref 20–29)
Calcium: 9.6 mg/dL (ref 8.7–10.2)
Chloride: 105 mmol/L (ref 96–106)
Creatinine, Ser: 0.69 mg/dL — ABNORMAL LOW (ref 0.76–1.27)
EOS (ABSOLUTE): 0.3 x10E3/uL (ref 0.0–0.4)
Eos: 3 %
Ferritin: 87 ng/mL (ref 30–400)
Globulin, Total: 2.3 g/dL (ref 1.5–4.5)
Glucose: 76 mg/dL (ref 70–99)
Hematocrit: 30.5 % — ABNORMAL LOW (ref 37.5–51.0)
Hemoglobin: 9.9 g/dL — ABNORMAL LOW (ref 13.0–17.7)
Immature Grans (Abs): 0 x10E3/uL (ref 0.0–0.1)
Immature Granulocytes: 0 %
Lymphocytes Absolute: 4.2 x10E3/uL — ABNORMAL HIGH (ref 0.7–3.1)
Lymphs: 42 %
MCH: 28.4 pg (ref 26.6–33.0)
MCHC: 32.5 g/dL (ref 31.5–35.7)
MCV: 88 fL (ref 79–97)
Monocytes Absolute: 1.3 x10E3/uL — ABNORMAL HIGH (ref 0.1–0.9)
Monocytes: 13 %
Neutrophils Absolute: 4.1 x10E3/uL (ref 1.4–7.0)
Neutrophils: 40 %
Platelets: 457 x10E3/uL — ABNORMAL HIGH (ref 150–450)
Potassium: 4.7 mmol/L (ref 3.5–5.2)
RBC: 3.48 x10E6/uL — ABNORMAL LOW (ref 4.14–5.80)
RDW: 21.2 % — ABNORMAL HIGH (ref 11.6–15.4)
Retic Ct Pct: 6.3 % — ABNORMAL HIGH (ref 0.6–2.6)
Sodium: 141 mmol/L (ref 134–144)
Total Protein: 6.7 g/dL (ref 6.0–8.5)
Vit D, 25-Hydroxy: 9.4 ng/mL — ABNORMAL LOW (ref 30.0–100.0)
WBC: 10.1 x10E3/uL (ref 3.4–10.8)
eGFR: 132 mL/min/1.73 (ref >59)

## 2024-09-29 MED ORDER — VITAMIN D (ERGOCALCIFEROL) 1.25 MG (50000 UNIT) PO CAPS: 50000.0000 [IU] | ORAL_CAPSULE | ORAL | 2 refills | Status: AC

## 2024-09-30 ENCOUNTER — Other Ambulatory Visit: Payer: Self-pay | Admitting: Nurse Practitioner

## 2024-09-30 DIAGNOSIS — I10 Essential (primary) hypertension: Secondary | ICD-10-CM

## 2024-09-30 DIAGNOSIS — D571 Sickle-cell disease without crisis: Secondary | ICD-10-CM

## 2024-09-30 DIAGNOSIS — G894 Chronic pain syndrome: Secondary | ICD-10-CM

## 2024-09-30 DIAGNOSIS — R809 Proteinuria, unspecified: Secondary | ICD-10-CM

## 2024-09-30 NOTE — Telephone Encounter (Signed)
 Please advise [facility]

## 2024-10-01 ENCOUNTER — Other Ambulatory Visit: Payer: Self-pay | Admitting: Nurse Practitioner

## 2024-10-01 ENCOUNTER — Telehealth: Payer: Self-pay | Admitting: Nurse Practitioner

## 2024-10-01 ENCOUNTER — Other Ambulatory Visit (HOSPITAL_COMMUNITY): Payer: Self-pay

## 2024-10-01 DIAGNOSIS — D571 Sickle-cell disease without crisis: Secondary | ICD-10-CM

## 2024-10-01 DIAGNOSIS — G894 Chronic pain syndrome: Secondary | ICD-10-CM

## 2024-10-01 MED ORDER — OXYCODONE HCL 10 MG PO TABS
10.0000 mg | ORAL_TABLET | ORAL | 0 refills | Status: DC | PRN
  Filled 2024-10-01: qty 60, 10d supply, fill #0

## 2024-10-01 NOTE — Telephone Encounter (Signed)
 Copied from CRM #[id number]. Topic: Clinical - Prescription Issue >> Oct 01, 2024  1:11 PM [name] wrote: Reason for CRM: Patient stated Paseda, [place], FNP told him to call back if his Oxycodone  HCl 10 MG TABS was not ready  Callback #: [id number]  Pharmacy: [id number] - [place], [place] - [address] AT [facility] OF [address] [place] [address] Phone: [phone] Fax: [phone] Hours: Open 24 hours

## 2024-10-02 ENCOUNTER — Other Ambulatory Visit (HOSPITAL_COMMUNITY): Payer: Self-pay

## 2024-10-03 LAB — TOXASSURE FLEX 15, UR
6-ACETYLMORPHINE IA: NEGATIVE ng/mL
7-aminoclonazepam: NOT DETECTED ng/mg{creat}
AMPHETAMINES IA: NEGATIVE ng/mL
Alpha-hydroxyalprazolam: NOT DETECTED ng/mg{creat}
Alpha-hydroxymidazolam: NOT DETECTED ng/mg{creat}
Alpha-hydroxytriazolam: NOT DETECTED ng/mg{creat}
Alprazolam: NOT DETECTED ng/mg{creat}
BARBITURATES IA: NEGATIVE ng/mL
BUPRENORPHINE: NEGATIVE
Benzodiazepines: NEGATIVE
Buprenorphine: NOT DETECTED ng/mg{creat}
COCAINE METABOLITE IA: NEGATIVE ng/mL
Clonazepam: NOT DETECTED ng/mg{creat}
Creatinine: 94 mg/dL (ref >=20)
Desalkylflurazepam: NOT DETECTED ng/mg{creat}
Desmethyldiazepam: NOT DETECTED ng/mg{creat}
Desmethylflunitrazepam: NOT DETECTED ng/mg{creat}
Diazepam: NOT DETECTED ng/mg{creat}
ETHYL ALCOHOL Enzymatic: NEGATIVE g/dL
FENTANYL: NEGATIVE
Fentanyl: NOT DETECTED ng/mg{creat}
Flunitrazepam: NOT DETECTED ng/mg{creat}
Lorazepam: NOT DETECTED ng/mg{creat}
METHADONE IA: NEGATIVE ng/mL
METHADONE MTB IA: NEGATIVE ng/mL
Midazolam: NOT DETECTED ng/mg{creat}
Norbuprenorphine: NOT DETECTED ng/mg{creat}
Norfentanyl: NOT DETECTED ng/mg{creat}
OPIATE CLASS IA: NEGATIVE ng/mL
OXYCODONE CLASS IA: NEGATIVE ng/mL
Oxazepam: NOT DETECTED ng/mg{creat}
PHENCYCLIDINE IA: NEGATIVE ng/mL
TAPENTADOL, IA: NEGATIVE ng/mL
TRAMADOL IA: NEGATIVE ng/mL
Temazepam: NOT DETECTED ng/mg{creat}

## 2024-10-03 LAB — CANNABINOIDS, MS, UR RFX
Cannabinoids Confirmation: POSITIVE — AB
Carboxy-THC: 90 ng/mg{creat}

## 2024-10-05 ENCOUNTER — Other Ambulatory Visit: Payer: Self-pay | Admitting: Nurse Practitioner

## 2024-10-05 DIAGNOSIS — D571 Sickle-cell disease without crisis: Secondary | ICD-10-CM

## 2024-10-05 DIAGNOSIS — G894 Chronic pain syndrome: Secondary | ICD-10-CM

## 2024-10-05 MED ORDER — OXYCODONE HCL 10 MG PO TABS: 10.0000 mg | ORAL_TABLET | ORAL | 0 refills | Status: AC | PRN

## 2024-10-05 NOTE — Progress Notes (Signed)
 Reviewed PDMP substance reporting system prior to prescribing opiate medications. No inconsistencies noted.   1. 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(HCC)  - Oxycodone  HCl 10 MG TABS; Take 1 tablet (10 mg total) by mouth every 4 (four) hours as needed (pain).  Dispense: 90 tablet; Refill: 0  2. Chronic pain syndrome  - Oxycodone  HCl 10 MG TABS; Take 1 tablet (10 mg total) by mouth every 4 (four) hours as needed (pain).  Dispense: 90 tablet; Refill: 0

## 2024-10-05 NOTE — Telephone Encounter (Signed)
 Please advise [facility]

## 2024-10-23 ENCOUNTER — Emergency Department (HOSPITAL_BASED_OUTPATIENT_CLINIC_OR_DEPARTMENT_OTHER)

## 2024-10-23 ENCOUNTER — Other Ambulatory Visit: Payer: Self-pay

## 2024-10-23 ENCOUNTER — Encounter (HOSPITAL_BASED_OUTPATIENT_CLINIC_OR_DEPARTMENT_OTHER): Payer: Self-pay

## 2024-10-23 ENCOUNTER — Emergency Department (HOSPITAL_BASED_OUTPATIENT_CLINIC_OR_DEPARTMENT_OTHER)
Admission: EM | Admit: 2024-10-23 | Discharge: 2024-10-23 | Disposition: A | Discharge status: Home / Self Care | Attending: Emergency Medicine | Admitting: Emergency Medicine

## 2024-10-23 DIAGNOSIS — D57219 Sickle-cell/Hb-C disease with crisis, unspecified: Secondary | ICD-10-CM | POA: Diagnosis present

## 2024-10-23 DIAGNOSIS — J45909 Unspecified asthma, uncomplicated: Secondary | ICD-10-CM | POA: Diagnosis not present

## 2024-10-23 DIAGNOSIS — Z8616 Personal history of COVID-19: Secondary | ICD-10-CM | POA: Diagnosis not present

## 2024-10-23 DIAGNOSIS — Z79899 Other long term (current) drug therapy: Secondary | ICD-10-CM | POA: Insufficient documentation

## 2024-10-23 DIAGNOSIS — I10 Essential (primary) hypertension: Secondary | ICD-10-CM | POA: Insufficient documentation

## 2024-10-23 DIAGNOSIS — D5700 Hb-SS disease with crisis, unspecified: Secondary | ICD-10-CM

## 2024-10-23 LAB — BASIC METABOLIC PANEL WITH GFR
Anion gap: 11 (ref 5–15)
BUN: 9 mg/dL (ref 6–20)
CO2: 24 mmol/L (ref 22–32)
Calcium: 9.2 mg/dL (ref 8.9–10.3)
Chloride: 106 mmol/L (ref 98–111)
Creatinine, Ser: 0.67 mg/dL (ref 0.61–1.24)
GFR, Estimated: >60 mL/min (ref >60)
Glucose, Bld: 93 mg/dL (ref 70–99)
Potassium: 4.2 mmol/L (ref 3.5–5.1)
Sodium: 141 mmol/L (ref 135–145)

## 2024-10-23 LAB — CBC WITH DIFFERENTIAL/PLATELET
Abs Immature Granulocytes: 0.05 K/uL (ref 0.00–0.07)
Basophils Absolute: 0.2 K/uL — ABNORMAL HIGH (ref 0.0–0.1)
Basophils Relative: 1 %
Eosinophils Absolute: 0.3 K/uL (ref 0.0–0.5)
Eosinophils Relative: 3 %
HCT: 27.4 % — ABNORMAL LOW (ref 39.0–52.0)
Hemoglobin: 9.8 g/dL — ABNORMAL LOW (ref 13.0–17.0)
Immature Granulocytes: 0 %
Lymphocytes Relative: 26 %
Lymphs Abs: 3.4 K/uL (ref 0.7–4.0)
MCH: 28.9 pg (ref 26.0–34.0)
MCHC: 35.8 g/dL (ref 30.0–36.0)
MCV: 80.8 fL (ref 80.0–100.0)
Monocytes Absolute: 1.5 K/uL — ABNORMAL HIGH (ref 0.1–1.0)
Monocytes Relative: 12 %
Neutro Abs: 7.6 K/uL (ref 1.7–7.7)
Neutrophils Relative %: 58 %
Platelets: 480 K/uL — ABNORMAL HIGH (ref 150–400)
RBC: 3.39 MIL/uL — ABNORMAL LOW (ref 4.22–5.81)
RDW: 21.4 % — ABNORMAL HIGH (ref 11.5–15.5)
Smear Review: NORMAL
WBC: 13 K/uL — ABNORMAL HIGH (ref 4.0–10.5)
nRBC: 0.5 % — ABNORMAL HIGH (ref 0.0–0.2)

## 2024-10-23 LAB — RETICULOCYTES
Immature Retic Fract: 41.1 % — ABNORMAL HIGH (ref 2.3–15.9)
RBC.: 3.44 MIL/uL — ABNORMAL LOW (ref 4.22–5.81)
Retic Count, Absolute: 241.8 K/uL — ABNORMAL HIGH (ref 19.0–186.0)
Retic Ct Pct: 7 % — ABNORMAL HIGH (ref 0.4–3.1)

## 2024-10-23 MED ORDER — ONDANSETRON HCL 4 MG/2ML IJ SOLN
4.0000 mg | INTRAMUSCULAR | Status: DC | PRN
Start: 2024-10-23 — End: 2024-10-23

## 2024-10-23 MED ORDER — HYDROMORPHONE HCL 1 MG/ML IJ SOLN
0.5000 mg | INTRAMUSCULAR | Status: AC
  Administered 2024-10-23: 0.5 mg via INTRAVENOUS
  Filled 2024-10-23: qty 1

## 2024-10-23 MED ORDER — DIPHENHYDRAMINE HCL 25 MG PO CAPS: 25.0000 mg | ORAL_CAPSULE | ORAL | Status: DC | PRN

## 2024-10-23 MED ORDER — KETOROLAC TROMETHAMINE 15 MG/ML IJ SOLN
15.0000 mg | INTRAMUSCULAR | Status: AC
  Administered 2024-10-23: 15 mg via INTRAVENOUS
  Filled 2024-10-23: qty 1

## 2024-10-23 MED ORDER — OXYCODONE HCL 5 MG PO TABS
10.0000 mg | ORAL_TABLET | Freq: Once | ORAL | Status: AC
  Administered 2024-10-23: 10 mg via ORAL
  Filled 2024-10-23: qty 2

## 2024-10-23 MED ORDER — HYDROMORPHONE HCL 1 MG/ML IJ SOLN
1.0000 mg | INTRAMUSCULAR | Status: AC
  Administered 2024-10-23: 1 mg via INTRAVENOUS
  Filled 2024-10-23: qty 1

## 2024-10-23 NOTE — ED Triage Notes (Signed)
 Reports R arm pain for 3 days. States L sided chest pain and mid back pain starting today. States he thinks it Is sickle cell pain.   Denies [facility]

## 2024-10-23 NOTE — Discharge Instructions (Addendum)
 It was a pleasure caring for you today in the emergency department.  Please follow-up with your sickle cell specialist at your appointment next week  Please return to the emergency department for any worsening or worrisome symptoms.

## 2024-10-23 NOTE — ED Notes (Signed)
 Report received from Marolyn, [place]. Assuming pt care at this time.
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
## 2024-10-26 ENCOUNTER — Emergency Department (HOSPITAL_BASED_OUTPATIENT_CLINIC_OR_DEPARTMENT_OTHER)

## 2024-10-26 ENCOUNTER — Emergency Department (HOSPITAL_BASED_OUTPATIENT_CLINIC_OR_DEPARTMENT_OTHER)
Admission: EM | Admit: 2024-10-26 | Discharge: 2024-10-26 | Disposition: A | Discharge status: Home / Self Care | Attending: Psychology | Admitting: Psychology

## 2024-10-26 ENCOUNTER — Encounter (HOSPITAL_BASED_OUTPATIENT_CLINIC_OR_DEPARTMENT_OTHER): Payer: Self-pay

## 2024-10-26 ENCOUNTER — Other Ambulatory Visit: Payer: Self-pay

## 2024-10-26 DIAGNOSIS — D5700 Hb-SS disease with crisis, unspecified: Secondary | ICD-10-CM | POA: Diagnosis present

## 2024-10-26 DIAGNOSIS — I10 Essential (primary) hypertension: Secondary | ICD-10-CM | POA: Diagnosis not present

## 2024-10-26 DIAGNOSIS — D72829 Elevated white blood cell count, unspecified: Secondary | ICD-10-CM | POA: Diagnosis not present

## 2024-10-26 DIAGNOSIS — G8929 Other chronic pain: Secondary | ICD-10-CM

## 2024-10-26 LAB — COMPREHENSIVE METABOLIC PANEL WITH GFR
ALT: 16 U/L (ref 0–44)
AST: 32 U/L (ref 15–41)
Albumin: 4.1 g/dL (ref 3.5–5.0)
Alkaline Phosphatase: 67 U/L (ref 38–126)
Anion gap: 11 (ref 5–15)
BUN: 8 mg/dL (ref 6–20)
CO2: 25 mmol/L (ref 22–32)
Calcium: 9 mg/dL (ref 8.9–10.3)
Chloride: 104 mmol/L (ref 98–111)
Creatinine, Ser: 0.72 mg/dL (ref 0.61–1.24)
GFR, Estimated: >60 mL/min (ref >60)
Glucose, Bld: 96 mg/dL (ref 70–99)
Potassium: 3.8 mmol/L (ref 3.5–5.1)
Sodium: 139 mmol/L (ref 135–145)
Total Bilirubin: 2.4 mg/dL — ABNORMAL HIGH (ref 0.0–1.2)
Total Protein: 6.4 g/dL — ABNORMAL LOW (ref 6.5–8.1)

## 2024-10-26 LAB — CBC WITH DIFFERENTIAL/PLATELET
Abs Immature Granulocytes: 0.08 K/uL — ABNORMAL HIGH (ref 0.00–0.07)
Basophils Absolute: 0.2 K/uL — ABNORMAL HIGH (ref 0.0–0.1)
Basophils Relative: 1 %
Eosinophils Absolute: 0.6 K/uL — ABNORMAL HIGH (ref 0.0–0.5)
Eosinophils Relative: 4 %
HCT: 25.4 % — ABNORMAL LOW (ref 39.0–52.0)
Hemoglobin: 9.2 g/dL — ABNORMAL LOW (ref 13.0–17.0)
Immature Granulocytes: 1 %
Lymphocytes Relative: 32 %
Lymphs Abs: 4.7 K/uL — ABNORMAL HIGH (ref 0.7–4.0)
MCH: 29 pg (ref 26.0–34.0)
MCHC: 36.2 g/dL — ABNORMAL HIGH (ref 30.0–36.0)
MCV: 80.1 fL (ref 80.0–100.0)
Monocytes Absolute: 1.7 K/uL — ABNORMAL HIGH (ref 0.1–1.0)
Monocytes Relative: 11 %
Neutro Abs: 7.5 K/uL (ref 1.7–7.7)
Neutrophils Relative %: 51 %
Platelets: 471 K/uL — ABNORMAL HIGH (ref 150–400)
RBC: 3.17 MIL/uL — ABNORMAL LOW (ref 4.22–5.81)
RDW: 20.2 % — ABNORMAL HIGH (ref 11.5–15.5)
WBC: 14.7 K/uL — ABNORMAL HIGH (ref 4.0–10.5)
nRBC: 0.4 % — ABNORMAL HIGH (ref 0.0–0.2)

## 2024-10-26 LAB — RETICULOCYTES
Immature Retic Fract: 38.4 % — ABNORMAL HIGH (ref 2.3–15.9)
RBC.: 3.2 MIL/uL — ABNORMAL LOW (ref 4.22–5.81)
Retic Count, Absolute: 189.4 K/uL — ABNORMAL HIGH (ref 19.0–186.0)
Retic Ct Pct: 5.9 % — ABNORMAL HIGH (ref 0.4–3.1)

## 2024-10-26 LAB — RESP PANEL BY RT-PCR (RSV, FLU A&B, COVID)  RVPGX2
Influenza A by PCR: NEGATIVE
Influenza B by PCR: NEGATIVE
Resp Syncytial Virus by PCR: NEGATIVE
SARS Coronavirus 2 by RT PCR: NEGATIVE

## 2024-10-26 MED ORDER — ONDANSETRON HCL 4 MG/2ML IJ SOLN: 4.0000 mg | INTRAMUSCULAR | Status: DC | PRN

## 2024-10-26 MED ORDER — HYDROMORPHONE HCL 1 MG/ML IJ SOLN
1.0000 mg | Freq: Once | INTRAMUSCULAR | Status: AC
  Administered 2024-10-26: 1 mg via INTRAVENOUS
  Filled 2024-10-26: qty 1

## 2024-10-26 MED ORDER — DIPHENHYDRAMINE HCL 25 MG PO CAPS: 25.0000 mg | ORAL_CAPSULE | ORAL | Status: DC | PRN

## 2024-10-26 MED ORDER — OXYCODONE HCL 5 MG PO TABS
5.0000 mg | ORAL_TABLET | Freq: Once | ORAL | Status: AC
  Administered 2024-10-26: 5 mg via ORAL
  Filled 2024-10-26: qty 1

## 2024-10-26 MED ORDER — HYDROMORPHONE HCL 1 MG/ML IJ SOLN
1.0000 mg | INTRAMUSCULAR | Status: AC
  Administered 2024-10-26: 1 mg via SUBCUTANEOUS
  Filled 2024-10-26: qty 1

## 2024-10-26 MED ORDER — HYDROMORPHONE HCL 1 MG/ML IJ SOLN
1.0000 mg | INTRAMUSCULAR | Status: DC
  Filled 2024-10-26: qty 1

## 2024-10-26 MED ORDER — SODIUM CHLORIDE 0.45 % IV SOLN: INTRAVENOUS | Status: AC

## 2024-10-26 NOTE — ED Triage Notes (Addendum)
 Pt reports pain never improved from last visit 0n 10/23/24 Pain to both arms, chest and forehead. Nasal congestion x3 days cough & chills

## 2024-10-26 NOTE — Discharge Instructions (Signed)
 It was a pleasure caring for you today in the emergency department.  Please follow up with your sickle cell team regarding your pain regimen  Please return to the emergency department for any worsening or worrisome symptoms.

## 2024-10-26 NOTE — ED Provider Notes (Signed)
 [place] [facility] Provider Note   CSN: [id number] Arrival date & time: 10/26/24  0840     Patient presents with: Sickle Cell Pain Crisis   [name] Hill is a 23 y.o. male past medical history of sickle cell crisis, hemoglobin has been, hypertension, sickle cell anemia with sickle cell nephropathy that presents today due to chest pain, bilateral upper extremity pain and congestion for the last 2 days.  Patient was in the ED about 2 days ago with similar symptoms but did not have any congestion.  Patients pain is being managed with ibuprofen  and oxycodone  per his sickle cell primary team.  Patient states that he has follow-up scheduled tomorrow but pain was not controlled and that is why he came today.  He denies any shortness of breath, lower extremity edema.  States that he has had some cough that is developed and had some tea today and was coughing up some thick green phlegm.  He denies any fevers or chills.  He does endorse some mild frontal region but states this is more associated with coughing and denies any blurry vision.    Sickle Cell Pain Crisis      Prior to Admission medications   Medication Sig Start Date End Date Taking? Authorizing Provider  albuterol  (VENTOLIN  HFA) 108 (90 Base) MCG/ACT inhaler Inhale 2 puffs into the lungs every 6 (six) hours as needed for wheezing or shortness of breath. 10/08/22   Tilford Bertram CHRISTELLA, FNP  folic acid  (FOLVITE ) 1 MG tablet Take 1 tablet (1 mg total) by mouth daily. 12/10/23   Paseda, Folashade R, FNP  ibuprofen  (ADVIL ) 800 MG tablet Take 1 tablet (800 mg total) by mouth every 8 (eight) hours as needed. 10/07/23   Paseda, Folashade R, FNP  losartan  (COZAAR ) 50 MG tablet Take 1 tablet (50 mg total) by mouth daily. 09/30/24   Paseda, Folashade R, FNP  montelukast  (SINGULAIR ) 10 MG tablet Take 1 tablet (10 mg total) by mouth at bedtime. 09/11/23 09/28/24  Oley Bascom RAMAN, NP  Oxycodone  HCl 10 MG TABS Take 1  tablet (10 mg total) by mouth every 4 (four) hours as needed (pain). 10/11/24   Paseda, Folashade R, FNP  Vitamin D , Ergocalciferol , (DRISDOL ) 1.25 MG (50000 UNIT) CAPS capsule Take 1 capsule (50,000 Units total) by mouth every 7 (seven) days. 09/29/24   Paseda, Folashade R, FNP    Allergies: Lisinopril , Porcine (pork) protein-containing drug products, and Shellfish allergy    Review of Systems As noted in HPI Updated Vital Signs BP 131/78   Pulse 66   Temp 98.5 F (36.9 C)   Wt 88.5 kg   SpO2 96%   BMI 30.54 kg/m   Physical Exam Cardiovascular:     Rate and Rhythm: Normal rate and regular rhythm.     Heart sounds: No murmur heard. Pulmonary:     Effort: Pulmonary effort is normal. No respiratory distress.     Breath sounds: Normal breath sounds.  Abdominal:     General: Abdomen is flat.     Palpations: Abdomen is soft.     Tenderness: There is no abdominal tenderness.  Musculoskeletal:     Right lower leg: No edema.     Left lower leg: No edema.     Comments: No calf tenderness bilaterally. Good DP pulses bilaterally.  Extremities are warm  Neurological:     Mental Status: He is alert.     (all labs ordered are listed, but only abnormal  results are displayed) Labs Reviewed  RESP PANEL BY RT-PCR (RSV, FLU A&B, COVID)  [id number]  COMPREHENSIVE METABOLIC PANEL WITH GFR  CBC WITH DIFFERENTIAL/PLATELET  RETICULOCYTES    EKG: EKG Interpretation Date/Time:  Tuesday October 26 2024 08:58:05 EST Ventricular Rate:  72 PR Interval:  212 QRS Duration:  101 QT Interval:  383 QTC Calculation: 420 R Axis:   78  Text Interpretation: Sinus rhythm Prolonged PR interval Confirmed by Elnor Savant ([id number]) on 10/26/2024 8:59:41 AM  Radiology: No results found.   Procedures   Medications Ordered in the ED  HYDROmorphone  (DILAUDID ) injection 1 mg (has no administration in time range)  HYDROmorphone  (DILAUDID ) injection 1 mg (has no administration in time range)  HYDROmorphone   (DILAUDID ) injection 1 mg (has no administration in time range)  diphenhydrAMINE  (BENADRYL ) capsule 25-50 mg (has no administration in time range)  ondansetron  (ZOFRAN ) injection 4 mg (has no administration in time range)                                    Medical Decision Making Amount and/or Complexity of Data Reviewed Labs: ordered. Radiology: ordered.  Risk Prescription drug management.   Differentials include MSK pain, GERD, anxiety or panic attack, pneumonia, ACS, sickle cell, pericarditis, pulmonary embolism  Patient's vital signs are stable and he is on room air.  Will get blood work to rule out hemolytic crisis.  Labs include CBC with differential and reticulocytes. Will check CMP for basic lbs. Patient is also having some viral upper respiratory symptoms that have developed over the last two days. Will check viral respiratory panel and cxr for any signs of viral vs bacterial infection that needs to be treated   Respiratory panel is negative. Total bilirubin mildly elevated on CMP but this seems to be about baseline for patient.Hemoglobin is stable. Mild increase in leukocytosis. But no signs of acute process on CXR. Patient has received one round of subcutaneous dilaudid  and says that this did not help. Did discuss with patient that we will try another round of medications and see how this does. He is supposed to have follow up tomorrow with sickle cell team.   Patient received 2 rounds of IV opioids and another round of oral oxycodone  for pain management.  Did discuss admission with patient for pain control and sickle cell pain crisis and patient declined.  He states that he would like his pain under better control.  Did discuss with him and his grandmother as grandmother said that he had protocol set up about 5 years ago with [facility].  Did discuss with grandmother that patient is now being seen by different team and would likely benefit from protocol discussion with them for pain  crisis.  Patient was agreeable to this last round of IV medication and discharged with follow-up with the sickle cell team tomorrow.  He states that his pain is better than when he came in.       Final diagnoses:  None    ED Discharge Orders     None          D'Mello, Xochilt Conant, DO 10/26/24 1611    Elnor Savant LABOR, DO 10/28/24 [id number]

## 2024-10-26 NOTE — ED Notes (Signed)
 ED Provider at bedside.

## 2024-10-27 ENCOUNTER — Encounter: Payer: Self-pay | Admitting: Nurse Practitioner

## 2024-10-27 ENCOUNTER — Ambulatory Visit (INDEPENDENT_AMBULATORY_CARE_PROVIDER_SITE_OTHER): Payer: Self-pay | Admitting: Nurse Practitioner

## 2024-10-27 VITALS — BP 122/65 | HR 82 | Wt 207.0 lb

## 2024-10-27 DIAGNOSIS — Z09 Encounter for follow-up examination after completed treatment for conditions other than malignant neoplasm: Secondary | ICD-10-CM | POA: Diagnosis not present

## 2024-10-27 DIAGNOSIS — G894 Chronic pain syndrome: Secondary | ICD-10-CM | POA: Diagnosis not present

## 2024-10-27 DIAGNOSIS — L700 Acne vulgaris: Secondary | ICD-10-CM | POA: Diagnosis not present

## 2024-10-27 DIAGNOSIS — D571 Sickle-cell disease without crisis: Secondary | ICD-10-CM

## 2024-10-27 MED ORDER — IBUPROFEN 800 MG PO TABS: 800.0000 mg | ORAL_TABLET | Freq: Three times a day (TID) | ORAL | 3 refills | Status: AC | PRN
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
## 2024-10-27 NOTE — Assessment & Plan Note (Signed)
 Sickle cell disease with acute pain  Acute pain crisis with chest and arm pain, suboptimal pain management, stable hemoglobin at 9.2. - Advised to refill oxycodone  10 mg every 4 hours as needed for pain. - Refilled ibuprofen  800 mg, one tablet three times daily as needed for mild pain. - Advised to stay well hydrated and warm , avoid triggers of infection to prevent further crises. - Instructed to return to the hospital if symptoms do not improve. - Ensured folic acid  supplementation is continued.

## 2024-10-27 NOTE — Assessment & Plan Note (Signed)
  Recurrent acne with scarring, likely stress-related

## 2024-10-27 NOTE — Assessment & Plan Note (Signed)
 Chronic pain syndrome Chronic pain management complicated by acute sickle cell crisis, inadequate relief with current regimen. - Continue oxycodone  10 mg every 4 hours as needed. - Continue ibuprofen  800 mg, one tablet three times daily as needed. - Advised against taking double doses of pain medication.

## 2024-10-27 NOTE — Patient Instructions (Signed)

## 2024-10-27 NOTE — Assessment & Plan Note (Signed)
 Hospital chart reviewed, including discharge summary Medications reconciled and reviewed with the patient in detail

## 2025-01-04 ENCOUNTER — Encounter: Payer: Self-pay | Admitting: Nurse Practitioner
